# Patient Record
Sex: Male | Born: 1935 | Race: White | Hispanic: No | Marital: Married | Smoking: Former smoker
Health system: Southern US, Community
[De-identification: ages and names within clinical notes are randomized; demographics above are authoritative.]

## PROBLEM LIST (undated history)

## (undated) DIAGNOSIS — K649 Unspecified hemorrhoids: Secondary | ICD-10-CM

## (undated) DIAGNOSIS — M1990 Unspecified osteoarthritis, unspecified site: Secondary | ICD-10-CM

## (undated) DIAGNOSIS — I4891 Unspecified atrial fibrillation: Secondary | ICD-10-CM

## (undated) DIAGNOSIS — I639 Cerebral infarction, unspecified: Secondary | ICD-10-CM

## (undated) DIAGNOSIS — Z95810 Presence of automatic (implantable) cardiac defibrillator: Secondary | ICD-10-CM

## (undated) DIAGNOSIS — F32A Depression, unspecified: Secondary | ICD-10-CM

## (undated) DIAGNOSIS — I509 Heart failure, unspecified: Secondary | ICD-10-CM

## (undated) DIAGNOSIS — E785 Hyperlipidemia, unspecified: Secondary | ICD-10-CM

## (undated) DIAGNOSIS — I10 Essential (primary) hypertension: Secondary | ICD-10-CM

## (undated) DIAGNOSIS — C4490 Unspecified malignant neoplasm of skin, unspecified: Secondary | ICD-10-CM

## (undated) DIAGNOSIS — F329 Major depressive disorder, single episode, unspecified: Secondary | ICD-10-CM

## (undated) DIAGNOSIS — I5022 Chronic systolic (congestive) heart failure: Secondary | ICD-10-CM

## (undated) DIAGNOSIS — Z87442 Personal history of urinary calculi: Secondary | ICD-10-CM

## (undated) DIAGNOSIS — J189 Pneumonia, unspecified organism: Secondary | ICD-10-CM

## (undated) DIAGNOSIS — I255 Ischemic cardiomyopathy: Secondary | ICD-10-CM

## (undated) DIAGNOSIS — N289 Disorder of kidney and ureter, unspecified: Secondary | ICD-10-CM

## (undated) DIAGNOSIS — G459 Transient cerebral ischemic attack, unspecified: Secondary | ICD-10-CM

## (undated) DIAGNOSIS — K222 Esophageal obstruction: Secondary | ICD-10-CM

## (undated) DIAGNOSIS — N400 Enlarged prostate without lower urinary tract symptoms: Secondary | ICD-10-CM

## (undated) DIAGNOSIS — Z8719 Personal history of other diseases of the digestive system: Secondary | ICD-10-CM

## (undated) DIAGNOSIS — K219 Gastro-esophageal reflux disease without esophagitis: Secondary | ICD-10-CM

## (undated) DIAGNOSIS — I219 Acute myocardial infarction, unspecified: Secondary | ICD-10-CM

## (undated) DIAGNOSIS — I2510 Atherosclerotic heart disease of native coronary artery without angina pectoris: Secondary | ICD-10-CM

## (undated) DIAGNOSIS — R339 Retention of urine, unspecified: Secondary | ICD-10-CM

## (undated) HISTORY — DX: Ischemic cardiomyopathy: I25.5

## (undated) HISTORY — DX: Hyperlipidemia, unspecified: E78.5

## (undated) HISTORY — DX: Gastro-esophageal reflux disease without esophagitis: K21.9

## (undated) HISTORY — DX: Essential (primary) hypertension: I10

## (undated) HISTORY — DX: Personal history of other diseases of the digestive system: Z87.19

## (undated) HISTORY — DX: Esophageal obstruction: K22.2

## (undated) HISTORY — DX: Transient cerebral ischemic attack, unspecified: G45.9

## (undated) HISTORY — DX: Chronic systolic (congestive) heart failure: I50.22

## (undated) HISTORY — DX: Benign prostatic hyperplasia without lower urinary tract symptoms: N40.0

## (undated) HISTORY — PX: JOINT REPLACEMENT: SHX530

## (undated) HISTORY — PX: OTHER SURGICAL HISTORY: SHX169

## (undated) HISTORY — DX: Unspecified atrial fibrillation: I48.91

## (undated) HISTORY — DX: Personal history of urinary calculi: Z87.442

## (undated) HISTORY — DX: Heart failure, unspecified: I50.9

## (undated) HISTORY — DX: Unspecified hemorrhoids: K64.9

## (undated) HISTORY — DX: Unspecified malignant neoplasm of skin, unspecified: C44.90

---

## 1998-12-26 ENCOUNTER — Ambulatory Visit (HOSPITAL_COMMUNITY): Admission: RE | Admit: 1998-12-26 | Discharge: 1998-12-26 | Payer: Self-pay | Admitting: Internal Medicine

## 1999-02-25 ENCOUNTER — Encounter: Admission: RE | Admit: 1999-02-25 | Discharge: 1999-04-22 | Payer: Self-pay | Admitting: Orthopedic Surgery

## 1999-08-05 ENCOUNTER — Encounter (INDEPENDENT_AMBULATORY_CARE_PROVIDER_SITE_OTHER): Payer: Self-pay | Admitting: Specialist

## 1999-08-05 ENCOUNTER — Ambulatory Visit (HOSPITAL_COMMUNITY): Admission: RE | Admit: 1999-08-05 | Discharge: 1999-08-05 | Payer: Self-pay | Admitting: *Deleted

## 2000-12-22 ENCOUNTER — Encounter (INDEPENDENT_AMBULATORY_CARE_PROVIDER_SITE_OTHER): Payer: Self-pay | Admitting: Specialist

## 2000-12-22 ENCOUNTER — Ambulatory Visit (HOSPITAL_COMMUNITY): Admission: RE | Admit: 2000-12-22 | Discharge: 2000-12-22 | Payer: Self-pay | Admitting: *Deleted

## 2002-01-31 ENCOUNTER — Ambulatory Visit (HOSPITAL_COMMUNITY): Admission: RE | Admit: 2002-01-31 | Discharge: 2002-01-31 | Payer: Self-pay | Admitting: *Deleted

## 2002-01-31 ENCOUNTER — Encounter (INDEPENDENT_AMBULATORY_CARE_PROVIDER_SITE_OTHER): Payer: Self-pay | Admitting: Specialist

## 2004-02-07 ENCOUNTER — Ambulatory Visit (HOSPITAL_COMMUNITY): Admission: RE | Admit: 2004-02-07 | Discharge: 2004-02-07 | Payer: Self-pay | Admitting: *Deleted

## 2004-02-07 ENCOUNTER — Encounter (INDEPENDENT_AMBULATORY_CARE_PROVIDER_SITE_OTHER): Payer: Self-pay | Admitting: Specialist

## 2004-11-23 ENCOUNTER — Ambulatory Visit (HOSPITAL_COMMUNITY): Admission: RE | Admit: 2004-11-23 | Discharge: 2004-11-23 | Payer: Self-pay | Admitting: *Deleted

## 2004-11-23 ENCOUNTER — Encounter (INDEPENDENT_AMBULATORY_CARE_PROVIDER_SITE_OTHER): Payer: Self-pay | Admitting: *Deleted

## 2005-08-03 ENCOUNTER — Emergency Department (HOSPITAL_COMMUNITY): Admission: EM | Admit: 2005-08-03 | Discharge: 2005-08-04 | Payer: Self-pay | Admitting: Emergency Medicine

## 2005-08-24 ENCOUNTER — Ambulatory Visit (HOSPITAL_COMMUNITY): Admission: RE | Admit: 2005-08-24 | Discharge: 2005-08-24 | Payer: Self-pay | Admitting: *Deleted

## 2005-08-24 ENCOUNTER — Encounter (INDEPENDENT_AMBULATORY_CARE_PROVIDER_SITE_OTHER): Payer: Self-pay | Admitting: Specialist

## 2006-11-18 ENCOUNTER — Ambulatory Visit: Payer: Self-pay | Admitting: Cardiovascular Disease

## 2006-11-18 ENCOUNTER — Encounter: Payer: Self-pay | Admitting: Cardiovascular Disease

## 2006-11-18 ENCOUNTER — Observation Stay (HOSPITAL_COMMUNITY): Admission: EM | Admit: 2006-11-18 | Discharge: 2006-11-21 | Payer: Self-pay | Admitting: Emergency Medicine

## 2006-11-18 ENCOUNTER — Ambulatory Visit: Payer: Self-pay | Admitting: Cardiology

## 2006-12-05 ENCOUNTER — Ambulatory Visit: Payer: Self-pay | Admitting: Cardiovascular Disease

## 2006-12-05 ENCOUNTER — Encounter: Payer: Self-pay | Admitting: Physician Assistant

## 2006-12-05 LAB — CONVERTED CEMR LAB
Chloride: 106 meq/L (ref 96–112)
Creatinine, Ser: 1.2 mg/dL (ref 0.4–1.5)
Magnesium: 2.3 mg/dL (ref 1.5–2.5)
Sodium: 141 meq/L (ref 135–145)

## 2006-12-13 HISTORY — PX: CARDIAC DEFIBRILLATOR PLACEMENT: SHX171

## 2006-12-19 ENCOUNTER — Ambulatory Visit: Payer: Self-pay | Admitting: Internal Medicine

## 2006-12-26 ENCOUNTER — Ambulatory Visit: Payer: Self-pay | Admitting: Cardiovascular Disease

## 2006-12-26 LAB — CONVERTED CEMR LAB
Albumin: 3.6 g/dL (ref 3.5–5.2)
Cholesterol: 96 mg/dL (ref 0–200)
HDL: 21.6 mg/dL — ABNORMAL LOW (ref >39.0)
LDL Cholesterol: 53 mg/dL (ref 0–99)
Total Bilirubin: 0.9 mg/dL (ref 0.3–1.2)
Total CHOL/HDL Ratio: 4.4
Triglycerides: 109 mg/dL (ref 0–149)

## 2006-12-28 ENCOUNTER — Ambulatory Visit: Payer: Self-pay | Admitting: Internal Medicine

## 2006-12-28 ENCOUNTER — Observation Stay (HOSPITAL_COMMUNITY): Admission: AD | Admit: 2006-12-28 | Discharge: 2006-12-29 | Payer: Self-pay | Admitting: Internal Medicine

## 2007-01-12 ENCOUNTER — Ambulatory Visit: Payer: Self-pay

## 2007-01-25 ENCOUNTER — Ambulatory Visit: Payer: Self-pay | Admitting: Cardiovascular Disease

## 2007-03-21 ENCOUNTER — Ambulatory Visit: Payer: Self-pay | Admitting: Internal Medicine

## 2007-03-22 ENCOUNTER — Ambulatory Visit: Payer: Self-pay | Admitting: Cardiovascular Disease

## 2007-06-22 ENCOUNTER — Ambulatory Visit: Payer: Self-pay | Admitting: Internal Medicine

## 2007-06-27 ENCOUNTER — Ambulatory Visit: Payer: Self-pay | Admitting: Cardiovascular Disease

## 2007-09-21 ENCOUNTER — Ambulatory Visit: Payer: Self-pay | Admitting: Internal Medicine

## 2007-11-01 ENCOUNTER — Emergency Department (HOSPITAL_COMMUNITY): Admission: EM | Admit: 2007-11-01 | Discharge: 2007-11-01 | Payer: Self-pay | Admitting: Emergency Medicine

## 2007-12-14 ENCOUNTER — Ambulatory Visit: Payer: Self-pay | Admitting: Cardiovascular Disease

## 2008-01-03 ENCOUNTER — Ambulatory Visit: Payer: Self-pay

## 2008-03-26 ENCOUNTER — Ambulatory Visit: Payer: Self-pay | Admitting: Internal Medicine

## 2008-04-22 DIAGNOSIS — Z95810 Presence of automatic (implantable) cardiac defibrillator: Secondary | ICD-10-CM

## 2008-04-22 DIAGNOSIS — E7800 Pure hypercholesterolemia, unspecified: Secondary | ICD-10-CM | POA: Insufficient documentation

## 2008-04-22 DIAGNOSIS — I5041 Acute combined systolic (congestive) and diastolic (congestive) heart failure: Secondary | ICD-10-CM

## 2008-04-22 DIAGNOSIS — I2510 Atherosclerotic heart disease of native coronary artery without angina pectoris: Secondary | ICD-10-CM | POA: Insufficient documentation

## 2008-06-17 ENCOUNTER — Encounter: Payer: Self-pay | Admitting: Cardiovascular Disease

## 2008-06-17 ENCOUNTER — Ambulatory Visit: Payer: Self-pay | Admitting: Cardiovascular Disease

## 2008-06-20 ENCOUNTER — Ambulatory Visit: Payer: Self-pay | Admitting: Internal Medicine

## 2008-09-10 ENCOUNTER — Encounter: Payer: Self-pay | Admitting: Internal Medicine

## 2008-09-19 ENCOUNTER — Ambulatory Visit: Payer: Self-pay | Admitting: Internal Medicine

## 2008-11-18 ENCOUNTER — Telehealth (INDEPENDENT_AMBULATORY_CARE_PROVIDER_SITE_OTHER): Payer: Self-pay | Admitting: *Deleted

## 2008-12-13 ENCOUNTER — Telehealth: Payer: Self-pay | Admitting: Adult Health

## 2008-12-13 ENCOUNTER — Encounter (INDEPENDENT_AMBULATORY_CARE_PROVIDER_SITE_OTHER): Payer: Self-pay | Admitting: *Deleted

## 2008-12-13 ENCOUNTER — Ambulatory Visit: Payer: Self-pay | Admitting: Cardiovascular Disease

## 2008-12-17 ENCOUNTER — Inpatient Hospital Stay (HOSPITAL_COMMUNITY): Admission: EM | Admit: 2008-12-17 | Discharge: 2008-12-19 | Payer: Self-pay | Admitting: Emergency Medicine

## 2008-12-17 ENCOUNTER — Ambulatory Visit: Payer: Self-pay | Admitting: Cardiovascular Disease

## 2008-12-17 ENCOUNTER — Telehealth: Payer: Self-pay | Admitting: Cardiovascular Disease

## 2008-12-17 ENCOUNTER — Encounter (INDEPENDENT_AMBULATORY_CARE_PROVIDER_SITE_OTHER): Payer: Self-pay | Admitting: *Deleted

## 2008-12-17 DIAGNOSIS — E876 Hypokalemia: Secondary | ICD-10-CM

## 2008-12-17 LAB — CONVERTED CEMR LAB
BUN: 11 mg/dL (ref 6–23)
Creatinine, Ser: 1 mg/dL (ref 0.4–1.5)
GFR calc non Af Amer: 77.37 mL/min (ref >60)
Glucose, Bld: 137 mg/dL — ABNORMAL HIGH (ref 70–99)
HCT: 37.8 % — ABNORMAL LOW (ref 39.0–52.0)
Hemoglobin: 13.1 g/dL (ref 13.0–17.0)
MCHC: 34.5 g/dL (ref 30.0–36.0)
Potassium: 3.9 meq/L (ref 3.5–5.1)
Prothrombin Time: 14.4 s (ref 11.6–15.2)
RBC: 4.18 M/uL — ABNORMAL LOW (ref 4.22–5.81)
Sodium: 142 meq/L (ref 135–145)

## 2008-12-18 ENCOUNTER — Encounter: Payer: Self-pay | Admitting: Cardiovascular Disease

## 2008-12-19 ENCOUNTER — Ambulatory Visit: Payer: Self-pay | Admitting: Internal Medicine

## 2008-12-26 ENCOUNTER — Encounter: Payer: Self-pay | Admitting: Internal Medicine

## 2009-01-23 ENCOUNTER — Ambulatory Visit: Payer: Self-pay | Admitting: Cardiovascular Disease

## 2009-01-28 LAB — CONVERTED CEMR LAB
BUN: 13 mg/dL (ref 6–23)
CO2: 29 meq/L (ref 19–32)
Calcium: 8.4 mg/dL (ref 8.4–10.5)
Chloride: 107 meq/L (ref 96–112)
Creatinine, Ser: 1.2 mg/dL (ref 0.4–1.5)
GFR calc non Af Amer: 62.67 mL/min (ref >60)
Glucose, Bld: 155 mg/dL — ABNORMAL HIGH (ref 70–99)
Potassium: 3.3 meq/L — ABNORMAL LOW (ref 3.5–5.1)
Sodium: 142 meq/L (ref 135–145)

## 2009-02-10 ENCOUNTER — Ambulatory Visit: Payer: Self-pay | Admitting: Cardiovascular Disease

## 2009-02-11 LAB — CONVERTED CEMR LAB
CO2: 28 meq/L (ref 19–32)
Calcium: 9.2 mg/dL (ref 8.4–10.5)
Chloride: 109 meq/L (ref 96–112)
GFR calc non Af Amer: 62.66 mL/min (ref >60)
Potassium: 4.2 meq/L (ref 3.5–5.1)

## 2009-03-19 ENCOUNTER — Ambulatory Visit: Payer: Self-pay | Admitting: Cardiovascular Disease

## 2009-03-24 LAB — CONVERTED CEMR LAB
BUN: 13 mg/dL (ref 6–23)
CO2: 31 meq/L (ref 19–32)
Calcium: 9 mg/dL (ref 8.4–10.5)
Creatinine, Ser: 1.3 mg/dL (ref 0.4–1.5)
GFR calc non Af Amer: 57.12 mL/min (ref >60)

## 2009-04-08 ENCOUNTER — Encounter: Payer: Self-pay | Admitting: Internal Medicine

## 2009-04-08 ENCOUNTER — Encounter: Payer: Self-pay | Admitting: Cardiovascular Disease

## 2009-04-23 ENCOUNTER — Ambulatory Visit: Payer: Self-pay | Admitting: Internal Medicine

## 2009-04-23 ENCOUNTER — Encounter: Payer: Self-pay | Admitting: Internal Medicine

## 2009-04-23 DIAGNOSIS — M129 Arthropathy, unspecified: Secondary | ICD-10-CM | POA: Insufficient documentation

## 2009-04-23 DIAGNOSIS — N401 Enlarged prostate with lower urinary tract symptoms: Secondary | ICD-10-CM

## 2009-04-23 DIAGNOSIS — Z8719 Personal history of other diseases of the digestive system: Secondary | ICD-10-CM

## 2009-04-23 DIAGNOSIS — I10 Essential (primary) hypertension: Secondary | ICD-10-CM | POA: Insufficient documentation

## 2009-04-25 ENCOUNTER — Ambulatory Visit: Payer: Self-pay | Admitting: Internal Medicine

## 2009-06-03 ENCOUNTER — Telehealth (INDEPENDENT_AMBULATORY_CARE_PROVIDER_SITE_OTHER): Payer: Self-pay | Admitting: *Deleted

## 2009-06-04 ENCOUNTER — Encounter (INDEPENDENT_AMBULATORY_CARE_PROVIDER_SITE_OTHER): Payer: Self-pay | Admitting: *Deleted

## 2009-06-04 ENCOUNTER — Telehealth (INDEPENDENT_AMBULATORY_CARE_PROVIDER_SITE_OTHER): Payer: Self-pay | Admitting: *Deleted

## 2009-07-24 ENCOUNTER — Ambulatory Visit: Payer: Self-pay | Admitting: Internal Medicine

## 2009-08-25 ENCOUNTER — Ambulatory Visit: Payer: Self-pay | Admitting: Internal Medicine

## 2009-08-28 ENCOUNTER — Encounter: Payer: Self-pay | Admitting: Internal Medicine

## 2009-09-02 ENCOUNTER — Telehealth: Payer: Self-pay | Admitting: Cardiovascular Disease

## 2009-09-02 ENCOUNTER — Encounter: Payer: Self-pay | Admitting: Cardiovascular Disease

## 2009-09-08 ENCOUNTER — Encounter: Payer: Self-pay | Admitting: Cardiovascular Disease

## 2009-09-09 ENCOUNTER — Telehealth: Payer: Self-pay | Admitting: Cardiovascular Disease

## 2009-09-15 ENCOUNTER — Encounter: Payer: Self-pay | Admitting: Internal Medicine

## 2009-09-16 ENCOUNTER — Ambulatory Visit: Payer: Self-pay | Admitting: Cardiovascular Disease

## 2009-09-16 DIAGNOSIS — M719 Bursopathy, unspecified: Secondary | ICD-10-CM

## 2009-09-16 DIAGNOSIS — M67919 Unspecified disorder of synovium and tendon, unspecified shoulder: Secondary | ICD-10-CM | POA: Insufficient documentation

## 2009-09-17 ENCOUNTER — Encounter: Admission: RE | Admit: 2009-09-17 | Discharge: 2009-09-17 | Payer: Self-pay | Admitting: Sports Medicine

## 2009-10-23 ENCOUNTER — Ambulatory Visit: Payer: Self-pay | Admitting: Internal Medicine

## 2009-11-24 ENCOUNTER — Ambulatory Visit: Payer: Self-pay | Admitting: Internal Medicine

## 2009-11-24 LAB — CONVERTED CEMR LAB
ALT: 15 units/L (ref 0–53)
AST: 19 units/L (ref 0–37)
Albumin: 4.1 g/dL (ref 3.5–5.2)
Direct LDL: 60.4 mg/dL
HDL: 30.3 mg/dL — ABNORMAL LOW (ref >39.00)
Total CHOL/HDL Ratio: 5

## 2009-11-28 ENCOUNTER — Encounter: Payer: Self-pay | Admitting: Cardiovascular Disease

## 2009-11-28 ENCOUNTER — Encounter: Payer: Self-pay | Admitting: Internal Medicine

## 2009-12-24 ENCOUNTER — Ambulatory Visit (HOSPITAL_COMMUNITY): Admission: EM | Admit: 2009-12-24 | Discharge: 2009-12-24 | Payer: Self-pay | Admitting: Emergency Medicine

## 2009-12-24 ENCOUNTER — Ambulatory Visit: Payer: Self-pay | Admitting: Gastroenterology

## 2009-12-25 ENCOUNTER — Telehealth: Payer: Self-pay | Admitting: Gastroenterology

## 2009-12-31 ENCOUNTER — Telehealth: Payer: Self-pay | Admitting: Gastroenterology

## 2010-01-08 ENCOUNTER — Encounter (INDEPENDENT_AMBULATORY_CARE_PROVIDER_SITE_OTHER): Payer: Self-pay | Admitting: *Deleted

## 2010-01-09 ENCOUNTER — Ambulatory Visit: Payer: Self-pay | Admitting: Gastroenterology

## 2010-01-16 ENCOUNTER — Ambulatory Visit: Payer: Self-pay | Admitting: Gastroenterology

## 2010-01-22 ENCOUNTER — Ambulatory Visit: Payer: Self-pay | Admitting: Internal Medicine

## 2010-02-20 ENCOUNTER — Encounter: Payer: Self-pay | Admitting: Internal Medicine

## 2010-02-23 ENCOUNTER — Ambulatory Visit: Payer: Self-pay | Admitting: Internal Medicine

## 2010-02-26 ENCOUNTER — Encounter: Payer: Self-pay | Admitting: Internal Medicine

## 2010-03-05 ENCOUNTER — Encounter: Payer: Self-pay | Admitting: Internal Medicine

## 2010-03-18 ENCOUNTER — Telehealth (INDEPENDENT_AMBULATORY_CARE_PROVIDER_SITE_OTHER): Payer: Self-pay | Admitting: *Deleted

## 2010-03-18 ENCOUNTER — Ambulatory Visit: Payer: Self-pay | Admitting: Cardiovascular Disease

## 2010-03-19 ENCOUNTER — Encounter: Payer: Self-pay | Admitting: Cardiovascular Disease

## 2010-04-27 ENCOUNTER — Encounter: Payer: Self-pay | Admitting: Internal Medicine

## 2010-04-28 ENCOUNTER — Ambulatory Visit: Payer: Self-pay | Admitting: Internal Medicine

## 2010-04-29 DIAGNOSIS — I4891 Unspecified atrial fibrillation: Secondary | ICD-10-CM | POA: Insufficient documentation

## 2010-05-14 ENCOUNTER — Ambulatory Visit: Payer: Self-pay | Admitting: Internal Medicine

## 2010-05-25 ENCOUNTER — Encounter: Payer: Self-pay | Admitting: Cardiovascular Disease

## 2010-05-25 ENCOUNTER — Encounter: Payer: Self-pay | Admitting: Internal Medicine

## 2010-05-27 ENCOUNTER — Ambulatory Visit: Payer: Self-pay | Admitting: Internal Medicine

## 2010-05-27 DIAGNOSIS — L259 Unspecified contact dermatitis, unspecified cause: Secondary | ICD-10-CM | POA: Insufficient documentation

## 2010-07-14 NOTE — Procedures (Signed)
Summary: EGD/food impaction   EGD  Procedure date:  12/24/2009  Findings:      Location: [facility]   ENDOSCOPY PROCEDURE REPORT  PATIENT:  Jacob [name], Jacob [name]  MR#:  [id number] BIRTHDATE:   [birth year]   GENDER:   male  ENDOSCOPIST:   Venita Lick. Russella Dar, MD, [facility] Referred by: [facility]  PROCEDURE DATE:  12/24/2009 PROCEDURE:  EGD with foreign body removal ASA CLASS:   Class II INDICATIONS: dysphagia, food impaction tonight while eating steak and pickles  MEDICATIONS:   Fentanyl 50 mcg IV, Versed 5 mg IV TOPICAL ANESTHETIC:   Cetacaine Spray DESCRIPTION OF PROCEDURE:   After the risks benefits and alternatives of the procedure were thoroughly explained, informed consent was obtained.  The Pentax [id number] [id number] endoscope was introduced through the mouth and advanced to the second portion of the duodenum, limited by a large amount of solid retained food in the stomach. The instrument was slowly withdrawn as the mucosa was fully examined. <<PROCEDUREIMAGES>>          <<OLD IMAGES>> Steak and pickles impacted in the distal esophagus. The impaction was removed. Esophagitis was found in the distal esophagus. It was erythematous.  A stricture was found in the distal esophagus. It was circumferential and benign appearing. It was 15 mm in diameter. Located 2-3 cm above EGJ.  Retained food was present in the body of the stomach.  The duodenal bulb was normal in appearance, as was the postbulbar duodenum.  Otherwise the examination was normal.  Retroflexed views revealed no abnormalities.  The scope was then withdrawn from the patient and the procedure completed.  COMPLICATIONS:   None   ENDOSCOPIC IMPRESSION:  1) Meat impaction in the distal esophagus  2) Esophagitis in the distal esophagus  3) 15 mm distal esopahgeal  stricture  4) Food, retained in the body of the stomach  RECOMMENDATIONS:  1) PPI qam long term  2) anti-reflux regimen  3) EGD/dilation in next 2-4  weeks 4) Clear liquids tonight and then a soft diet tomorrow avoiding meats and breads until dilation has been completed   Flay Ghosh T. Russella Dar, MD, [facility]

## 2010-07-14 NOTE — Miscellaneous (Signed)
Summary: [facility] Previsit/prep  Clinical Lists Changes  Medications: Added new medication of MOVIPREP 100 GM  SOLR (PEG-KCL-NACL-NASULF-NA ASC-C) As per prep instructions. - Signed Rx of MOVIPREP 100 GM  SOLR (PEG-KCL-NACL-NASULF-NA ASC-C) As per prep instructions.;  #1 x 0;  Signed;  Entered by: Wyona Almas RN;  Authorized by: Meryl Dare MD [facility];  Method used: Electronically to [facility] #[id number]*, [address], [place], [place]  [address], Ph: [phone], Fax: [phone] Observations: Added new observation of ALLERGY REV: Done (01/09/2010 10:30)    Prescriptions: MOVIPREP 100 GM  SOLR (PEG-KCL-NACL-NASULF-NA ASC-C) As per prep instructions.  #1 x 0   Entered by:   Wyona Almas RN   Authorized by:   Meryl Dare MD [employer]   Signed by:   Wyona Almas RN on 01/09/2010   Method used:   Electronically to        [id number] (retail)       [address] [place], [place]  [address]       Ph: [phone]       Fax: [phone]   RxID:   [id number]

## 2010-07-14 NOTE — Cardiovascular Report (Signed)
Summary: Office Visit Remote   Office Visit Remote   Imported By: Roderic Ovens 08/11/2009 11:16:44  _____________________________________________________________________  External Attachment:    Type:   Image     Comment:   External Document

## 2010-07-14 NOTE — Procedures (Signed)
Summary: Colonoscopy  Patient: Debra Calabretta Note: All result statuses are Final unless otherwise noted.  Tests: (1) Colonoscopy (COL)   COL Colonoscopy           DONE     [facility]     [address]     [place], [place]  [address]           COLONOSCOPY PROCEDURE REPORT           PATIENT:  Tam, Delisle  MR#:  [id number]     BIRTHDATE:  10/27/1931, 78 yrs. old  GENDER:  male     ENDOSCOPIST:  Judie Petit T. Russella Dar, MD, [facility]           PROCEDURE DATE:  01/16/2010     PROCEDURE:  Higher-risk screening colonoscopy G0105     ASA CLASS:  Class III     INDICATIONS:  1) surveillance and high-risk screening  2)     follow-up of polyp, history of polyps-type unknown     MEDICATIONS:   Fentanyl 50 mcg IV, Versed 6 mg IV     DESCRIPTION OF PROCEDURE:   After the risks benefits and     alternatives of the procedure were thoroughly explained, informed     consent was obtained.  Digital rectal exam was performed and     revealed no abnormalities.   The LB [id number] endoscope     was introduced through the anus and advanced to the cecum, which     was identified by both the appendix and ileocecal valve, without     limitations.  The quality of the prep was good, using MoviPrep.     The instrument was then slowly withdrawn as the colon was fully     examined.     <<PROCEDUREIMAGES>>     FINDINGS:  Moderate diverticulosis was found in the sigmoid to     descending colon.  A normal appearing cecum, ileocecal valve, and     appendiceal orifice were identified. The ascending, hepatic     flexure, transverse, splenic flexure, and rectum appeared     unremarkable. Retroflexed views in the rectum revealed internal     hemorrhoids, small.  The time to cecum =  1.5  minutes. The scope     was then withdrawn (time =  9  min) from the patient and the     procedure completed.     COMPLICATIONS:  None           ENDOSCOPIC IMPRESSION:     1) Moderate diverticulosis in the sigmoid to  descending colon     2) Internal hemorrhoids     RECOMMENDATIONS:     1) High fiber diet with liberal fluid intake.     2) No plans for future surveillance colonoscopies given no polyps     today, age, comorbidities           Judie Petit T. Russella Dar, MD, [facility]           n.     eSIGNED:   Venita Lick. Stark at 01/16/2010 03:33 PM           Mendel Ryder, [id number]  Note: An exclamation mark (!) indicates a result that was not dispersed into the flowsheet. Document Creation Date: 01/16/2010 3:35 PM _______________________________________________________________________  (1) Order result status: Final Collection or observation date-time: 01/16/2010 15:28 Requested date-time:  Receipt date-time:  Reported date-time:  Referring Physician:   Ordering Physician: Claudette Head [id number]) Specimen Source:  Source: Launa Grill Order Number: [id number] Lab site:

## 2010-07-14 NOTE — Letter (Signed)
Summary: [facility]  [facility]   Imported By: Marylou Mccoy 11/19/2009 08:22:38  _____________________________________________________________________  External Attachment:    Type:   Image     Comment:   External Document

## 2010-07-14 NOTE — Progress Notes (Signed)
Summary: status of faxed- [facility]   Phone Note Call from Patient Call back at Home Phone [phone]   Caller: Son- nicasio Reason for Call: Talk to Nurse Summary of Call: per son , a request was sent over to dr. Eden Emms [facility]. phone # [phone] ext [phone]. press 7. checking on status of faxed.  Initial call taken by: Jacob [name],  September 02, 2009 3:41 PM  Follow-up for Phone Call        spoke with pt son, paperwork recieved and waiting for sign Jacob Goody, RN  September 02, 2009 5:47 PM

## 2010-07-14 NOTE — Letter (Signed)
Summary: [facility]  [facility]   Imported By: Sherian Rein 03/03/2010 13:11:31  _____________________________________________________________________  External Attachment:    Type:   Image     Comment:   External Document

## 2010-07-14 NOTE — Progress Notes (Signed)
Summary: Triage  Phone Note Call from Patient Call back at Home Phone [phone] Call back at [phone] Cell#   Caller: Patient Call For: Dr. Russella Dar Reason for Call: Talk to Nurse Summary of Call: pt. is sch'd for Endo on 01-16-10 and wants to know if he can have a COL at the same time? Was a pt. of Dr. Virginia Rochester  and feels like he is due for COL. Initial call taken by: Karna Christmas,  December 31, 2009 3:46 PM  Follow-up for Phone Call        No family hx, no changes in bowel habits, constipation, rectal bleeding, diarrhea.  Dr Russella Dar I have pasted in last colon form Virginia Rochester 2006 in his chart.  please advise if ok to add colon ontop ESA .  You do have open spots on that day.  patient is notified Dr Russella Dar is out of the office until next week. Follow-up by: Darcey Nora RN, CGRN,  December 31, 2009 4:09 PM  Additional Follow-up for Phone Call Additional follow up Details #1::        OK to add COLON given history of polyps per Dr. Wende Neighbors 2006 colonoscopy report. His EGD from 7/13 is not in [facility]. Please place a copy in [facility].  Additional Follow-up by: Meryl Dare MD [facility],  January 05, 2010 3:21 PM    Additional Follow-up for Phone Call Additional follow up Details #2::    rescheduled to a double.  Patient  aware.  pre-visit is this Friday. Follow-up by: Darcey Nora RN, CGRN,  January 05, 2010 3:27 PM

## 2010-07-14 NOTE — Assessment & Plan Note (Signed)
Summary: 6 month rov/sl   Primary Provider:  Newt Lukes MD  CC:  no complaints.  History of Present Illness: Jacob [name] returns today for followup.  He is a pleasant 77 yo man with a h/o an ICM, EF 25%, class 1 CHF, HTN and dyslipidemia.  He denies c/p or sob.  He does have problems with arthritis in his knees.  He has had no intercurrent ICD therapies.  He was recathed in July 2010 and had a chronically occluded RCA with collaterals and no progression of left sided disease.  He has an apparant right rotator cuff tear and needs a CT arthrogram tomorrow.  This was arranged by Dr. Margaretha Sheffield since his AICD precludes an MRI.  I wrote him a note to take with him to clear him for this.  I assume Dr Gilmer Mor office or the radiologist arranged to have his Cr checked  Current Problems (verified): 1)  Hypercholesterolemia, Mixed  (ICD-272.0) 2)  Hypertension  (ICD-401.9) 3)  Coronary Atherosclerosis, Native Vessel  (ICD-414.01) 4)  CHF  (ICD-428.0) 5)  Automatic Implantable Cardiac Defibrillator Situ  (ICD-V45.02) 6)  Gerd  (ICD-530.81) 7)  Benign Prostatic Hypertrophy, With Obstruction  (ICD-600.01) 8)  Arthritis  (ICD-716.90) 9)  Urinary Incontinence  (ICD-788.30) 10)  Diverticulitis, Hx of  (ICD-V12.79) 11)  Encounter For Long-term Use of Other Medications  (ICD-V58.69) 12)  Hypokalemia  (ICD-276.8) 13)  Hypertensive Heart Disease, Benign, With CHF  (ICD-402.11)  Current Medications (verified): 1)  Carvedilol 6.25 Mg Tabs (Carvedilol) .... Take One Tablet By Mouth Twice A Day 2)  Aspirin 81 Mg Tbec (Aspirin) .... Take One Tablet By Mouth Daily 3)  Hyzaar 50-12.5 Mg Tabs (Losartan Potassium-Hctz) .Marland Kitchen.. 1 Tab Daily 4)  Omeprazole 20 Mg Cpdr (Omeprazole) .Marland Kitchen.. 1 Tab By Mouth Once Daily 5)  Imdur 30 Mg Xr24h-Tab (Isosorbide Mononitrate) .... Once Daily 6)  Simvastatin 40 Mg Tabs (Simvastatin) .Marland Kitchen.. 1 Tab Qhs 7)  Nasal Spray .... As Needed 8)  Hydrocodone-Acetaminophen 5-325 Mg Tabs  (Hydrocodone-Acetaminophen) .... As Needed 9)  Vitamin B-12  (Cyanocobalamin) .Marland Kitchen.. 1 Tab By Mouth Once Daily 10)  Furosemide 20 Mg Tabs (Furosemide) .... Take 1/2 By Mouth Qd 11)  Vesicare 5 Mg Tabs (Solifenacin Succinate) .... Take 1 Po Qd 12)  Naproxen 500 Mg Tabs (Naproxen) .... Take 1 By Mouth Qd 13)  Fluticasone Propionate 50 Mcg/act Susp (Fluticasone Propionate) .Marland Kitchen.. 1 Spray Each Nostril Once Daily Prn 14)  Flomax 0.4 Mg Caps (Tamsulosin Hcl) .Marland Kitchen.. 1 By Mouth Once Daily 15)  Avodart 0.5 Mg Caps (Dutasteride) .Marland Kitchen.. 1 By Mouth Once Daily  Allergies (verified): 1)  ! Penicillin  Past History:  Past Medical History: Last updated: 08/25/2009 CAD CHF: systolic dysfunction with old IMI Hypertension Hyperlipidemia BPH Barrett's Esophagus Nephrolithiasis, hx of Colonic Polyps Diverticulitis, hx of GERD Urinary incontinence  MD rooster - cards -[facility]ro -[facility] -orr  Past Surgical History: Last updated: 04/22/2008 AICD:  Ladona Ridgel 12/2006  Family History: Last updated: 04/23/2009 Family History of Arthritis (parent) Family History Hypertension (parent) Heart disease (parent)  Social History: Last updated: 04/22/2008 Married Retired Non-smoker Non-drinker  Review of Systems       Denies fever, malais, weight loss, blurry vision, decreased visual acuity, cough, sputum, SOB, hemoptysis, pleuritic pain, palpitaitons, heartburn, abdominal pain, melena, lower extremity edema, claudication, or rash.   Vital Signs:  Patient profile:   77 year old male Height:      70 inches Weight:      210 pounds  BMI:     30.24 Pulse rate:   72 / minute Resp:     12 per minute BP sitting:   100 / 57  (left arm)  Vitals Entered By: Kem Parkinson (September 16, 2009 3:15 PM)  Physical Exam  General:  Affect appropriate Healthy:  appears stated age HEENT: normal Neck supple with no adenopathy JVP normal no bruits no thyromegaly Lungs clear with no wheezing and good  diaphragmatic motion Heart:  S1/S2 no murmur,rub, gallop or click PMI enlarged  Abdomen: benighn, BS positve, no tenderness, no AAA no bruit.  No HSM or HJR Distal pulses intact with no bruits No edema Neuro non-focal Skin warm and dry AICD under left clavicle    ICD Specifications Following MD:  Lewayne Bunting, MD     Referring MD:  [facility] ICD Vendor:  [employer]     ICD Model Number:  [phone]     ICD Serial Number:  [id number] ICD DOI:  12/28/2006     ICD Implanting MD:  Lewayne Bunting, MD  Lead 1:    Location: [facility]     DOI: 12/28/2006     Model #: 9629     Serial #: [id number]     Status: active Lead 2:    Location: RV     DOI: 12/28/2006     Model #: 2440     Serial #: [id number]     Status: active  Indications::  ICM   ICD Follow Up ICD Dependent:  No      Episodes Coumadin:  No  Brady Parameters Mode DDD     Lower Rate Limit:  60     Upper Rate Limit 110 PAV 400     Sensed AV Delay:  290  Tachy Zones VF:  210     VT:  180     Impression & Recommendations:  Problem # 1:  CORONARY ATHEROSCLEROSIS, NATIVE VESSEL (ICD-414.01) Stable no angina  His updated medication list for this problem includes:    Carvedilol 6.25 Mg Tabs (Carvedilol) .Marland Kitchen... Take one tablet by mouth twice a day    Aspirin 81 Mg Tbec (Aspirin) .Marland Kitchen... Take one tablet by mouth daily    Imdur 30 Mg Xr24h-tab (Isosorbide mononitrate) ..... Once daily  Problem # 2:  HYPERTENSION (ICD-401.9) Well controlled His updated medication list for this problem includes:    Carvedilol 6.25 Mg Tabs (Carvedilol) .Marland Kitchen... Take one tablet by mouth twice a day    Aspirin 81 Mg Tbec (Aspirin) .Marland Kitchen... Take one tablet by mouth daily    Hyzaar 50-12.5 Mg Tabs (Losartan potassium-hctz) .Marland Kitchen... 1 tab daily    Furosemide 20 Mg Tabs (Furosemide) .Marland Kitchen... Take 1/2 by mouth qd  Problem # 3:  CHF (ICD-428.0) Stable functional class 2.   His updated medication list for this problem includes:    Carvedilol 6.25 Mg Tabs (Carvedilol) .Marland Kitchen... Take one  tablet by mouth twice a day    Aspirin 81 Mg Tbec (Aspirin) .Marland Kitchen... Take one tablet by mouth daily    Hyzaar 50-12.5 Mg Tabs (Losartan potassium-hctz) .Marland Kitchen... 1 tab daily    Imdur 30 Mg Xr24h-tab (Isosorbide mononitrate) ..... Once daily    Furosemide 20 Mg Tabs (Furosemide) .Marland Kitchen... Take 1/2 by mouth qd  Problem # 4:  AUTOMATIC IMPLANTABLE CARDIAC DEFIBRILLATOR SITU (ICD-V45.02) Normal device function with no D/C's  Problem # 5:  ROTATOR CUFF INJURY, RIGHT SHOULDER (ICD-726.10) Ok to have CT arthrogram.  F/U Dr Margaretha Sheffield.  Significant decreased ROM unable to  lift arm over head  Patient Instructions: 1)  Your physician recommends that you schedule a follow-up appointment in: 6 MONTHS

## 2010-07-14 NOTE — Letter (Signed)
Summary: [facility]  [facility]   Imported By: Lester [place] 12/12/2009 09:16:13  _____________________________________________________________________  External Attachment:    Type:   Image     Comment:   External Document

## 2010-07-14 NOTE — Letter (Signed)
Summary: Remote Device Check  [employer], Main Office  [address]   [place], [place] [address]   Phone: [phone]  Fax: [phone]     February 20, 2010 MRN: [id number]   Jacob [name] [address] [place], [place]  [address]   Dear [name],   Your remote transmission was recieved and reviewed by your physician.  All diagnostics were within normal limits for you.  __X____Your next office visit is scheduled for:    04-28-10 @ 1120.  Sincerely,  Vella Kohler

## 2010-07-14 NOTE — Assessment & Plan Note (Signed)
Summary: 3-4 MTH FU--STC   Vital Signs:  Patient profile:   77 year old male Height:      70 inches (177.80 cm) Weight:      199.8 pounds (90.82 kg) O2 Sat:      96 % on Room air Temp:     99.0 degrees F (37.22 degrees C) oral Pulse rate:   87 / minute BP sitting:   110 / 60  (left arm) Cuff size:   regular  Vitals Entered By: Orlan Leavens RMA (February 23, 2010 1:27 PM)  O2 Flow:  Room air CC: 3 month follow-up Is Patient Diabetic? No Pain Assessment Patient in pain? no        Primary Care Provider:  Newt Lukes MD  CC:  3 month follow-up.  History of Present Illness: here for 3 mo f/u - requests handicap tag form completed  c/o arthritis both shoulders, right>left, knees and hips s/p fall accidental on left shoulder taking naproxen 500 with some improvement - saw ortho for same - had steroid injection with temp relief  CHF/CAD - follows with [facility] cards for same - nishan/taylor/chf clinic reports compliance with ongoing medical treatment and no changes in medication dose or frequency. denies adverse side effects related to current therapy. no cp, SOB or edema -   HTN -  reports compliance with ongoing medical treatment and no changes in medication dose or frequency. denies adverse side effects related to current therapy.  no HA or vision changes  dyslipidemia - reports compliance with ongoing medical treatment and no changes in medication dose or frequency. denies adverse side effects related to current therapy. no GI or muscle c/o  BPH - follows with dr. Isabel Caprice -uro recent changes in meds reviewed - feels bladder emptying better hard time getting meds approved by insurance so has resumed avodart and flomax until meds cleared - needs refills on same   Clinical Review Panels:  Immunizations   Last Tetanus Booster:  Td (04/23/2009)   Last Flu Vaccine:  Fluvax 3+ (02/23/2010)   Last Pneumovax:  Pneumovax (Medicare) (04/23/2009)  Lipid Management  Cholesterol:  146 (11/24/2009)   LDL (bad choesterol):  53 (12/26/2006)   HDL (good cholesterol):  30.30 (11/24/2009)  CBC   WBC:  7.4 (12/13/2008)   RBC:  4.18 (12/13/2008)   Hgb:  13.1 (12/13/2008)   Hct:  37.8 (12/13/2008)   Platelets:  153 (12/13/2008)   MCV  90.5 (12/13/2008)   MCHC  34.5 (12/13/2008)   RDW  14.3 (12/13/2008)  Complete Metabolic Panel   Glucose:  115 (03/19/2009)   Sodium:  141 (03/19/2009)   Potassium:  3.8 (03/19/2009)   Chloride:  102 (03/19/2009)   CO2:  31 (03/19/2009)   BUN:  13 (03/19/2009)   Creatinine:  1.3 (03/19/2009)   Albumin:  4.1 (11/24/2009)   Total Protein:  7.1 (11/24/2009)   Calcium:  9.0 (03/19/2009)   Total Bili:  1.0 (11/24/2009)   Alk Phos:  77 (11/24/2009)   SGPT (ALT):  15 (11/24/2009)   SGOT (AST):  19 (11/24/2009)   Current Medications (verified): 1)  Carvedilol 6.25 Mg Tabs (Carvedilol) .... Take One Tablet By Mouth Twice A Day 2)  Aspirin 81 Mg Tbec (Aspirin) .... Take One Tablet By Mouth Daily 3)  Hyzaar 50-12.5 Mg Tabs (Losartan Potassium-Hctz) .Marland Kitchen.. 1 Tab Daily 4)  Omeprazole 20 Mg Cpdr (Omeprazole) .Marland Kitchen.. 1 Tab By Mouth Once Daily 5)  Imdur 30 Mg Xr24h-Tab (Isosorbide Mononitrate) .... Once Daily 6)  Simvastatin 40 Mg Tabs (Simvastatin) .Marland Kitchen.. 1 Tab Qhs 7)  Nasal Spray .... As Needed 8)  Hydrocodone-Acetaminophen 5-325 Mg Tabs (Hydrocodone-Acetaminophen) .... As Needed 9)  Vitamin B-12  (Cyanocobalamin) .Marland Kitchen.. 1 Tab By Mouth Once Daily 10)  Furosemide 20 Mg Tabs (Furosemide) .... Take 1/2 By Mouth Qd 11)  Vesicare 5 Mg Tabs (Solifenacin Succinate) .... Take 1 Po Qd 12)  Naproxen 500 Mg Tabs (Naproxen) .... Take 1 By Mouth Qd 13)  Fluticasone Propionate 50 Mcg/act Susp (Fluticasone Propionate) .Marland Kitchen.. 1 Spray Each Nostril Once Daily Prn 14)  Flomax 0.4 Mg Caps (Tamsulosin Hcl) .Marland Kitchen.. 1 By Mouth Once Daily 15)  Avodart 0.5 Mg Caps (Dutasteride) .Marland Kitchen.. 1 By Mouth Once Daily  Allergies (verified): 1)  ! Penicillin  Past  History:  Past Medical History: CAD CHF: systolic dysfunction with old IMI Hypertension Hyperlipidemia BPH  Barrett's Esophagus Nephrolithiasis, hx of Colonic Polyps Diverticulitis, hx of GERD  MD roster: cards - taylor uro -grapey GI -prev orr, now stark pain - ramos  Review of Systems  The patient denies chest pain, syncope, and headaches.    Physical Exam  General:  alert, well-developed, well-nourished, and cooperative to examination.   wife at side Lungs:  normal respiratory effort, no intercostal retractions or use of accessory muscles; normal breath sounds bilaterally - no crackles and no wheezes.    Heart:  normal rate, regular rhythm, no murmur, and no rub. BLE without edema.  Psych:  Oriented X3, memory intact for recent and remote, normally interactive, good eye contact, not anxious appearing, not depressed appearing, and not agitated.      Impression & Recommendations:  Problem # 1:  HYPERCHOLESTEROLEMIA, MIXED (ICD-272.0)  His updated medication list for this problem includes:    Simvastatin 40 Mg Tabs (Simvastatin) .Marland Kitchen... 1 tab qhs  Labs Reviewed: SGOT: 19 (11/24/2009)   SGPT: 15 (11/24/2009)   HDL:30.30 (11/24/2009), 21.6 (12/26/2006)  LDL:53 (12/26/2006)  Chol:146 (11/24/2009), 96 (12/26/2006)  Trig:278.0 (11/24/2009), 109 (12/26/2006)  Problem # 2:  HYPERTENSION (ICD-401.9)  His updated medication list for this problem includes:    Carvedilol 6.25 Mg Tabs (Carvedilol) .Marland Kitchen... Take one tablet by mouth twice a day    Hyzaar 50-12.5 Mg Tabs (Losartan potassium-hctz) .Marland Kitchen... 1 tab daily    Furosemide 20 Mg Tabs (Furosemide) .Marland Kitchen... Take 1/2 by mouth qd  BP today: 110/60 Prior BP: 110/58 (11/24/2009)  Labs Reviewed: K+: 3.8 (03/19/2009) Creat: : 1.3 (03/19/2009)   Chol: 146 (11/24/2009)   HDL: 30.30 (11/24/2009)   LDL: 53 (12/26/2006)   TG: 278.0 (11/24/2009)  Problem # 3:  ROTATOR CUFF INJURY, RIGHT SHOULDER (ICD-726.10)   F/U Dr Margaretha Sheffield and Ethelene Hal as  ongoing.  Significant decreased ROM unable to lift arm over head bilaterally use hydrocodone as needed   Problem # 4:  BENIGN PROSTATIC HYPERTROPHY, WITH OBSTRUCTION (ICD-600.01)  His updated medication list for this problem includes:    Flomax 0.4 Mg Caps (Tamsulosin hcl) .Marland Kitchen... 1 by mouth once daily    Avodart 0.5 Mg Caps (Dutasteride) .Marland Kitchen... 1 by mouth once daily follow with uro - med mgmt as ongoing  Problem # 5:  CHF (ICD-428.0)  His updated medication list for this problem includes:    Carvedilol 6.25 Mg Tabs (Carvedilol) .Marland Kitchen... Take one tablet by mouth twice a day    Aspirin 81 Mg Tbec (Aspirin) .Marland Kitchen... Take one tablet by mouth daily    Hyzaar 50-12.5 Mg Tabs (Losartan potassium-hctz) .Marland Kitchen... 1 tab daily    Furosemide 20 Mg Tabs (  Furosemide) .Marland Kitchen... Take 1/2 by mouth qd  Stable functional class 2.  f/u cards as ongoing - cont med mgmt  Complete Medication List: 1)  Carvedilol 6.25 Mg Tabs (Carvedilol) .... Take one tablet by mouth twice a day 2)  Aspirin 81 Mg Tbec (Aspirin) .... Take one tablet by mouth daily 3)  Hyzaar 50-12.5 Mg Tabs (Losartan potassium-hctz) .Marland Kitchen.. 1 tab daily 4)  Omeprazole 20 Mg Cpdr (Omeprazole) .Marland Kitchen.. 1 tab by mouth once daily 5)  Imdur 30 Mg Xr24h-tab (Isosorbide mononitrate) .... Once daily 6)  Simvastatin 40 Mg Tabs (Simvastatin) .Marland Kitchen.. 1 tab qhs 7)  Nasal Spray  .... As needed 8)  Hydrocodone-acetaminophen 5-325 Mg Tabs (Hydrocodone-acetaminophen) .... As needed 9)  Vitamin B-12 (cyanocobalamin)  .Marland Kitchen.. 1 tab by mouth once daily 10)  Furosemide 20 Mg Tabs (Furosemide) .... Take 1/2 by mouth qd 11)  Vesicare 5 Mg Tabs (Solifenacin succinate) .... Take 1 po qd 12)  Naproxen 500 Mg Tabs (Naproxen) .... Take 1 by mouth qd 13)  Fluticasone Propionate 50 Mcg/act Susp (Fluticasone propionate) .Marland Kitchen.. 1 spray each nostril once daily prn 14)  Flomax 0.4 Mg Caps (Tamsulosin hcl) .Marland Kitchen.. 1 by mouth once daily 15)  Avodart 0.5 Mg Caps (Dutasteride) .Marland Kitchen.. 1 by mouth once  daily  Other Orders: Flu Vaccine 24yrs + MEDICARE PATIENTS (Y4034) Administration Flu vaccine - MCR (V4259) Flu Vaccine Consent Questions     Do you have a history of severe allergic reactions to this vaccine? no    Any prior history of allergic reactions to egg and/or gelatin? no    Do you have a sensitivity to the preservative Thimersol? no    Do you have a past history of Guillan-Barre Syndrome? no    Do you currently have an acute febrile illness? no    Have you ever had a severe reaction to latex? no    Vaccine information given and explained to patient? yes    Are you currently pregnant? no    Lot Number:[id number]   Exp Date:12/12/2010   Site Given  Left Deltoid IM  Patient Instructions: 1)  it was good to see you today. 2)  medications reviewed - no changes - 3)  keep followup as planned with cardiology, GI and dr. Ethelene Hal or draper as discussed 4)  Please schedule a follow-up appointment in 3-4 months, sooner if problems.    Marland Kitchenlbmedflu

## 2010-07-14 NOTE — Letter (Signed)
Summary: [facility] Instructions  [facility]  [address] [place], [place] [address]   Phone: [phone]  Fax: [phone]       Marcia Cambria    1932-03-28    MRN: [id number]        Procedure Day Dorna Bloom:  Farrell Ours  01/16/10     Arrival Time:  2:00PM     Procedure Time:  3:00PM     Location of Procedure:                    Juliann Pares _  [facility] (4th Floor)                        PREPARATION FOR COLONOSCOPY WITH MOVIPREP   Starting 5 days prior to your procedure 01/11/10 do not eat nuts, seeds, popcorn, corn, beans, peas,  salads, or any raw vegetables.  Do not take any fiber supplements (e.g. Metamucil, Citrucel, and Benefiber).  THE DAY BEFORE YOUR PROCEDURE         DATE: 01/15/10  DAY: THURSDAY  1.  Drink clear liquids the entire day-NO SOLID FOOD  2.  Do not drink anything colored red or purple.  Avoid juices with pulp.  No orange juice.  3.  Drink at least 64 oz. (8 glasses) of fluid/clear liquids during the day to prevent dehydration and help the prep work efficiently.  CLEAR LIQUIDS INCLUDE: Water Jello Ice Popsicles Tea (sugar ok, no milk/cream) Powdered fruit flavored drinks Coffee (sugar ok, no milk/cream) Gatorade Juice: apple, white grape, white cranberry  Lemonade Clear bullion, consomm, broth Carbonated beverages (any kind) Strained chicken noodle soup Hard Candy                             4.  In the morning, mix first dose of MoviPrep solution:    Empty 1 Pouch A and 1 Pouch B into the disposable container    Add lukewarm drinking water to the top line of the container. Mix to dissolve    Refrigerate (mixed solution should be used within 24 hrs)  5.  Begin drinking the prep at 5:00 p.m. The MoviPrep container is divided by 4 marks.   Every 15 minutes drink the solution down to the next mark (approximately 8 oz) until the full liter is complete.   6.  Follow completed prep with 16 oz of clear liquid of your choice (Nothing  red or purple).  Continue to drink clear liquids until bedtime.  7.  Before going to bed, mix second dose of MoviPrep solution:    Empty 1 Pouch A and 1 Pouch B into the disposable container    Add lukewarm drinking water to the top line of the container. Mix to dissolve    Refrigerate  THE DAY OF YOUR PROCEDURE      DATE: 01/16/10  DAY: FRIDAY  Beginning at 10:00AM (5 hours before procedure):         1. Every 15 minutes, drink the solution down to the next mark (approx 8 oz) until the full liter is complete.  2. Follow completed prep with 16 oz. of clear liquid of your choice.    3. You may drink clear liquids until 1:00PM (2 HOURS BEFORE PROCEDURE).   MEDICATION INSTRUCTIONS  Unless otherwise instructed, you should take regular prescription medications with a small sip of water   as early as possible the morning  of your procedure.    Additional medication instructions:  Hold Furosemide the morning of procedure.         OTHER INSTRUCTIONS  You will need a responsible adult at least 75 years of age to accompany you and drive you home.   This person must remain in the waiting room during your procedure.  Wear loose fitting clothing that is easily removed.  Leave jewelry and other valuables at home.  However, you may wish to bring a book to read or  an iPod/MP3 player to listen to music as you wait for your procedure to start.  Remove all body piercing jewelry and leave at home.  Total time from sign-in until discharge is approximately 2-3 hours.  You should go home directly after your procedure and rest.  You can resume normal activities the  day after your procedure.  The day of your procedure you should not:   Drive   Make legal decisions   Operate machinery   Drink alcohol   Return to work  You will receive specific instructions about eating, activities and medications before you leave.    The above instructions have been reviewed and explained to me  by   Wyona Almas RN  January 09, 2010 11:14 AM     I fully understand and can verbalize these instructions _____________________________ Date _________

## 2010-07-14 NOTE — Letter (Signed)
Summary: [facility]  [facility]   Imported By: Debby Freiberg 12/17/2009 09:33:44  _____________________________________________________________________  External Attachment:    Type:   Image     Comment:   External Document

## 2010-07-14 NOTE — Assessment & Plan Note (Signed)
Summary: F6M/DM   Primary Provider:  Newt Lukes MD  CC:  dizziness.  History of Present Illness: [name] returns today for followup.  He is a pleasant 77 yo man with a h/o an ICM, EF 25%, class 1 CHF, HTN and dyslipidemia.  He denies c/p or sob.  He does have problems with arthritis in his knees.  He has had no intercurrent ICD therapies.  He was recathed in July 2010 and had a chronically occluded RCA with collaterals and no progression of left sided disease. He had relief of right rotator cuff injury with injections but may need surgery for left rotator cuff injury with Dr Ranell Patrick.  He is clear to have this from a cardiac perspective.  I have contacted [employer] to make sure they are available to turn his AICD on and off as the surgical field will likely be very close to his AICD  Current Problems (verified): 1)  Rotator Cuff Injury, Right Shoulder  (ICD-726.10) 2)  Hypercholesterolemia, Mixed  (ICD-272.0) 3)  Hypertension  (ICD-401.9) 4)  Coronary Atherosclerosis, Native Vessel  (ICD-414.01) 5)  CHF  (ICD-428.0) 6)  Automatic Implantable Cardiac Defibrillator Situ  (ICD-V45.02) 7)  Gerd  (ICD-530.81) 8)  Benign Prostatic Hypertrophy, With Obstruction  (ICD-600.01) 9)  Arthritis  (ICD-716.90) 10)  Urinary Incontinence  (ICD-788.30) 11)  Diverticulitis, Hx of  (ICD-V12.79) 12)  Encounter For Long-term Use of Other Medications  (ICD-V58.69) 13)  Hypokalemia  (ICD-276.8) 14)  Hypertensive Heart Disease, Benign, With CHF  (ICD-402.11)  Current Medications (verified): 1)  Carvedilol 6.25 Mg Tabs (Carvedilol) .... Take One Tablet By Mouth Twice A Day 2)  Aspirin 81 Mg Tbec (Aspirin) .... Take One Tablet By Mouth Daily 3)  Hyzaar 50-12.5 Mg Tabs (Losartan Potassium-Hctz) .Marland Kitchen.. 1 Tab Daily 4)  Omeprazole 20 Mg Cpdr (Omeprazole) .Marland Kitchen.. 1 Tab By Mouth Once Daily 5)  Imdur 30 Mg Xr24h-Tab (Isosorbide Mononitrate) .... Once Daily 6)  Simvastatin 40 Mg Tabs (Simvastatin) .Marland Kitchen.. 1 Tab Qhs 7)   Hydrocodone-Acetaminophen 5-325 Mg Tabs (Hydrocodone-Acetaminophen) .... As Needed 8)  Vitamin B-12  (Cyanocobalamin) .Marland Kitchen.. 1 Tab By Mouth Once Daily 9)  Furosemide 20 Mg Tabs (Furosemide) .... Take 1/2 By Mouth Qd 10)  Vesicare 5 Mg Tabs (Solifenacin Succinate) .... Take 1 Po Qd 11)  Fluticasone Propionate 50 Mcg/act Susp (Fluticasone Propionate) .Marland Kitchen.. 1 Spray Each Nostril Once Daily Prn 12)  Flomax 0.4 Mg Caps (Tamsulosin Hcl) .Marland Kitchen.. 1 By Mouth Once Daily 13)  Avodart 0.5 Mg Caps (Dutasteride) .Marland Kitchen.. 1 By Mouth Once Daily  Allergies (verified): 1)  ! Penicillin  Past History:  Past Medical History: Last updated: 02/23/2010 CAD CHF: systolic dysfunction with old IMI Hypertension Hyperlipidemia BPH  Barrett's Esophagus Nephrolithiasis, hx of Colonic Polyps Diverticulitis, hx of GERD  MD roster: cards - taylor uro -grapey GI -prev orr, now stark pain - ramos  Past Surgical History: Last updated: 04/22/2008 AICD:  Ladona Ridgel 12/2006  Family History: Last updated: 04/23/2009 Family History of Arthritis (parent) Family History Hypertension (parent) Heart disease (parent)  Social History: Last updated: 04/22/2008 Married Retired Non-smoker Non-drinker  Review of Systems       Denies fever, malais, weight loss, blurry vision, decreased visual acuity, cough, sputum, SOB, hemoptysis, pleuritic pain, palpitaitons, heartburn, abdominal pain, melena, lower extremity edema, claudication, or rash.   Vital Signs:  Patient profile:   77 year old male Height:      70 inches Weight:      196 pounds BMI:  28.22 Pulse rate:   68 / minute Resp:     12 per minute BP sitting:   117 / 66  (left arm)  Vitals Entered By: Kem Parkinson (March 18, 2010 11:59 AM)  Physical Exam  General:  Affect appropriate Healthy:  appears stated age HEENT: normal Neck supple with no adenopathy JVP normal no bruits no thyromegaly Lungs clear with no wheezing and good diaphragmatic  motion Heart:  S1/S2  SEM no rub, gallop or click PMI enlarged  Abdomen: benighn, BS positve, no tenderness, no AAA no bruit.  No HSM or HJR Distal pulses intact with no bruits No edema Neuro non-focal Skin warm and dry AICD under left clavicle    ICD Specifications Following MD:  Lewayne Bunting, MD     Referring MD:  [facility] ICD Vendor:  [facility] [employer]     ICD Model Number:  [phone]     ICD Serial Number:  [id number] ICD DOI:  12/28/2006     ICD Implanting MD:  Lewayne Bunting, MD  Lead 1:    Location: RA     DOI: 12/28/2006     Model #: [id number]     Serial #: [id number]     Status: active Lead 2:    Location: RV     DOI: 12/28/2006     Model #: [id number]     Serial #: [id number]     Status: active  Indications::  ICM   ICD Follow Up ICD Dependent:  No      Episodes Coumadin:  No  Brady Parameters Mode DDD     Lower Rate Limit:  60     Upper Rate Limit 110 PAV 400     Sensed AV Delay:  290  Tachy Zones VF:  210     VT:  180     Impression & Recommendations:  Problem # 1:  ROTATOR CUFF INJURY, RIGHT SHOULDER (ICD-726.10) Clear to have surgery.l  [employer] to arrange to have AICD turned on /off.  Low risk for acute ischemic event given cath 2010 with stable disease.  CHF compensated.    Problem # 2:  HYPERTENSION (ICD-401.9) Well controlled His updated medication list for this problem includes:    Carvedilol 6.25 Mg Tabs (Carvedilol) .Marland Kitchen... Take one tablet by mouth twice a day    Aspirin 81 Mg Tbec (Aspirin) .Marland Kitchen... Take one tablet by mouth daily    Hyzaar 50-12.5 Mg Tabs (Losartan potassium-hctz) .Marland Kitchen... 1 tab daily    Furosemide 20 Mg Tabs (Furosemide) .Marland Kitchen... Take 1/2 by mouth qd  Problem # 3:  CORONARY ATHEROSCLEROSIS, NATIVE VESSEL (ICD-414.01) Stable no angina continue ASA and BB His updated medication list for this problem includes:    Carvedilol 6.25 Mg Tabs (Carvedilol) .Marland Kitchen... Take one tablet by mouth twice a day    Aspirin 81 Mg Tbec (Aspirin) .Marland Kitchen... Take one tablet by mouth daily    Imdur 30  Mg Xr24h-tab (Isosorbide mononitrate) ..... Once daily  Problem # 4:  CHF (ICD-428.0) Euvolemic functional class one.  Take diuretic morning of surgeyr His updated medication list for this problem includes:    Carvedilol 6.25 Mg Tabs (Carvedilol) .Marland Kitchen... Take one tablet by mouth twice a day    Aspirin 81 Mg Tbec (Aspirin) .Marland Kitchen... Take one tablet by mouth daily    Hyzaar 50-12.5 Mg Tabs (Losartan potassium-hctz) .Marland Kitchen... 1 tab daily    Imdur 30 Mg Xr24h-tab (Isosorbide mononitrate) ..... Once daily    Furosemide 20 Mg Tabs (Furosemide) .Marland KitchenMarland KitchenMarland KitchenMarland Kitchen  Take 1/2 by mouth qd  Orders: EKG w/ Interpretation (93000)  Patient Instructions: 1)  Your physician recommends that you schedule a follow-up appointment in: 6 MONTHS WITH DR Eden Emms 2)  Your physician recommends that you continue on your current medications as directed. Please refer to the Current Medication list given to you today.   EKG Report  Procedure date:  03/18/2010  Findings:      NSR 68 IVCD No change from previous

## 2010-07-14 NOTE — Assessment & Plan Note (Signed)
Summary: 3-4 MTH FU STC   Vital Signs:  Patient profile:   77 year old male Height:      70 inches (177.80 cm) Weight:      205.12 pounds (93.24 kg) O2 Sat:      94 % on Room air Temp:     97.1 degrees F (36.17 degrees C) oral Pulse rate:   96 / minute BP sitting:   110 / 58  (left arm) Cuff size:   regular  Vitals Entered By: Orlan Leavens (November 24, 2009 11:28 AM)  O2 Flow:  Room air CC: 3 month follow-up Is Patient Diabetic? No Pain Assessment Patient in pain? no        Primary Care Provider:  Newt Lukes MD  CC:  3 month follow-up.  History of Present Illness: here for 3 mo f/u - requests handicap tag form completed  c/o arthritis both shoulders, right>left, knees and hips taking naproxen 500 with some improvement - saw ortho for same - had steroid injection with temp relief  CHF/CAD - follows with [facility] cards for same - [facility]ishan/taylor/[facility] clinic reports compliance with ongoing medical treatment and no changes in medication dose or frequency. denies adverse side effects related to current therapy. no cp, SOB or edema -   HTN -  reports compliance with ongoing medical treatment and no changes in medication dose or frequency. denies adverse side effects related to current therapy.  no HA or vision changes  dyslipidemia - reports compliance with ongoing medical treatment and no changes in medication dose or frequency. denies adverse side effects related to current therapy. no GI or muscle c/o  BPH - follows with dr. Isabel Caprice -uro recent changes in meds reviewed - feels bladder emptying better hard time getting meds approved by insurance so has resumed avodart and flomax until meds cleared - needs refills on same   Clinical Review Panels:  Lipid Management   Cholesterol:  96 (12/26/2006)   LDL (bad choesterol):  53 (12/26/2006)   HDL (good cholesterol):  21.6 (12/26/2006)  CBC   WBC:  7.4 (12/13/2008)   RBC:  4.18 (12/13/2008)   Hgb:  13.1 (12/13/2008)   Hct:  37.8 (12/13/2008)   Platelets:  153 (12/13/2008)   MCV  90.5 (12/13/2008)   MCHC  34.5 (12/13/2008)   RDW  14.3 (12/13/2008)  Complete Metabolic Panel   Glucose:  115 (03/19/2009)   Sodium:  141 (03/19/2009)   Potassium:  3.8 (03/19/2009)   Chloride:  102 (03/19/2009)   CO2:  31 (03/19/2009)   BUN:  13 (03/19/2009)   Creatinine:  1.3 (03/19/2009)   Albumin:  3.6 (12/26/2006)   Total Protein:  6.9 (12/26/2006)   Calcium:  9.0 (03/19/2009)   Total Bili:  0.9 (12/26/2006)   Alk Phos:  91 (12/26/2006)   SGPT (ALT):  15 (12/26/2006)   SGOT (AST):  17 (12/26/2006)   Current Medications (verified): 1)  Carvedilol 6.25 Mg Tabs (Carvedilol) .... Take One Tablet By Mouth Twice A Day 2)  Aspirin 81 Mg Tbec (Aspirin) .... Take One Tablet By Mouth Daily 3)  Hyzaar 50-12.5 Mg Tabs (Losartan Potassium-Hctz) .Marland Kitchen.. 1 Tab Daily 4)  Omeprazole 20 Mg Cpdr (Omeprazole) .Marland Kitchen.. 1 Tab By Mouth Once Daily 5)  Imdur 30 Mg Xr24h-Tab (Isosorbide Mononitrate) .... Once Daily 6)  Simvastatin 40 Mg Tabs (Simvastatin) .Marland Kitchen.. 1 Tab Qhs 7)  Nasal Spray .... As Needed 8)  Hydrocodone-Acetaminophen 5-325 Mg Tabs (Hydrocodone-Acetaminophen) .... As Needed 9)  Vitamin B-12  (Cyanocobalamin) .Marland KitchenMarland KitchenMarland Kitchen  1 Tab By Mouth Once Daily 10)  Furosemide 20 Mg Tabs (Furosemide) .... Take 1/2 By Mouth Qd 11)  Vesicare 5 Mg Tabs (Solifenacin Succinate) .... Take 1 Po Qd 12)  Naproxen 500 Mg Tabs (Naproxen) .... Take 1 By Mouth Qd 13)  Fluticasone Propionate 50 Mcg/act Susp (Fluticasone Propionate) .Marland Kitchen.. 1 Spray Each Nostril Once Daily Prn 14)  Flomax 0.4 Mg Caps (Tamsulosin Hcl) .Marland Kitchen.. 1 By Mouth Once Daily 15)  Avodart 0.5 Mg Caps (Dutasteride) .Marland Kitchen.. 1 By Mouth Once Daily  Allergies (verified): 1)  ! Penicillin  Past History:  Past Medical History: CAD CHF: systolic dysfunction with old IMI Hypertension Hyperlipidemia BPH Barrett's Esophagus Nephrolithiasis, hx of Colonic Polyps Diverticulitis, hx of GERD  MD roster  - cards - taylor uro -grapey GI -orr pain - ramos  Review of Systems  The patient denies anorexia, fever, weight loss, weight gain, hoarseness, syncope, and abdominal pain.    Physical Exam  General:  alert, well-developed, well-nourished, and cooperative to examination.   wife at side Ears:  HOH - chronic Lungs:  normal respiratory effort, no intercostal retractions or use of accessory muscles; normal breath sounds bilaterally - no crackles and no wheezes.    Heart:  normal rate, regular rhythm, no murmur, and no rub. BLE without edema.  Msk:  limited abduction of right shoulder due to pain but also reduced PROM Neurologic:  alert & oriented X3 and cranial nerves II-XII symetrically intact.  strength normal in all extremities, sensation intact to light touch, and gait careful but grossly norma with cane assist. speech fluent without dysarthria or aphasia; follows commands with good comprehension.  Psych:  Oriented X3, memory intact for recent and remote, normally interactive, good eye contact, not anxious appearing, not depressed appearing, and not agitated.      Impression & Recommendations:  Problem # 1:  HYPERTENSION (ICD-401.9)  His updated medication list for this problem includes:    Carvedilol 6.25 Mg Tabs (Carvedilol) .Marland Kitchen... Take one tablet by mouth twice a day    Hyzaar 50-12.5 Mg Tabs (Losartan potassium-hctz) .Marland Kitchen... 1 tab daily    Furosemide 20 Mg Tabs (Furosemide) .Marland Kitchen... Take 1/2 by mouth qd  BP today: 110/58 Prior BP: 100/57 (09/16/2009)  Labs Reviewed: K+: 3.8 (03/19/2009) Creat: : 1.3 (03/19/2009)   Chol: 96 (12/26/2006)   HDL: 21.6 (12/26/2006)   LDL: 53 (12/26/2006)   TG: 109 (12/26/2006)  Problem # 2:  HYPERCHOLESTEROLEMIA, MIXED (ICD-272.0) check flp and lfts - His updated medication list for this problem includes:    Simvastatin 40 Mg Tabs (Simvastatin) .Marland Kitchen... 1 tab qhs  Orders: TLB-Lipid Panel ([id number])  Labs Reviewed: SGOT: 17 (12/26/2006)   SGPT:  15 (12/26/2006)   HDL:21.6 (12/26/2006)  LDL:53 (12/26/2006)  Chol:96 (12/26/2006)  Trig:109 (12/26/2006)  Problem # 3:  CORONARY ATHEROSCLEROSIS, NATIVE VESSEL (ICD-414.01)  stable, no angina cont current meds and follow with cards as ongoing His updated medication list for this problem includes:    Carvedilol 6.25 Mg Tabs (Carvedilol) .Marland Kitchen... Take one tablet by mouth twice a day    Aspirin 81 Mg Tbec (Aspirin) .Marland Kitchen... Take one tablet by mouth daily    Hyzaar 50-12.5 Mg Tabs (Losartan potassium-hctz) .Marland Kitchen... 1 tab daily    Imdur 30 Mg Xr24h-tab (Isosorbide mononitrate) ..... Once daily    Furosemide 20 Mg Tabs (Furosemide) .Marland Kitchen... Take 1/2 by mouth qd  Labs Reviewed: Chol: 96 (12/26/2006)   HDL: 21.6 (12/26/2006)   LDL: 53 (12/26/2006)   TG: 109 (12/26/2006)  Problem #  4:  BENIGN PROSTATIC HYPERTROPHY, WITH OBSTRUCTION (ICD-600.01)  His updated medication list for this problem includes:    Flomax 0.4 Mg Caps (Tamsulosin hcl) .Marland Kitchen... 1 by mouth once daily    Avodart 0.5 Mg Caps (Dutasteride) .Marland Kitchen... 1 by mouth once daily  not regularly taking jalyn due to insurance issues -  continue flomax and avodart until those issues settled- or as per uro -  Problem # 5:  ARTHRITIS (ICD-716.90) temp application for handicap done today - Time spent with patient/wife 25 minutes, more than 50% of this time was spent counseling patient on medications and form completion  Complete Medication List: 1)  Carvedilol 6.25 Mg Tabs (Carvedilol) .... Take one tablet by mouth twice a day 2)  Aspirin 81 Mg Tbec (Aspirin) .... Take one tablet by mouth daily 3)  Hyzaar 50-12.5 Mg Tabs (Losartan potassium-hctz) .Marland Kitchen.. 1 tab daily 4)  Omeprazole 20 Mg Cpdr (Omeprazole) .Marland Kitchen.. 1 tab by mouth once daily 5)  Imdur 30 Mg Xr24h-tab (Isosorbide mononitrate) .... Once daily 6)  Simvastatin 40 Mg Tabs (Simvastatin) .Marland Kitchen.. 1 tab qhs 7)  Nasal Spray  .... As needed 8)  Hydrocodone-acetaminophen 5-325 Mg Tabs (Hydrocodone-acetaminophen)  .... As needed 9)  Vitamin B-12 (cyanocobalamin)  .Marland Kitchen.. 1 tab by mouth once daily 10)  Furosemide 20 Mg Tabs (Furosemide) .... Take 1/2 by mouth qd 11)  Vesicare 5 Mg Tabs (Solifenacin succinate) .... Take 1 po qd 12)  Naproxen 500 Mg Tabs (Naproxen) .... Take 1 by mouth qd 13)  Fluticasone Propionate 50 Mcg/act Susp (Fluticasone propionate) .Marland Kitchen.. 1 spray each nostril once daily prn 14)  Flomax 0.4 Mg Caps (Tamsulosin hcl) .Marland Kitchen.. 1 by mouth once daily 15)  Avodart 0.5 Mg Caps (Dutasteride) .Marland Kitchen.. 1 by mouth once daily  Other Orders: TLB-Hepatic/Liver Function Pnl ([id number])  Patient Instructions: 1)  it was good to see you today. 2)  test(s) ordered today - your results will be posted on the phone tree for review in 48-72 hours from the time of test completion; call [phone] and enter your 9 digit MRN (listed above on this page, just below your name); if any changes need to be made or there are abnormal results, you will be contacted directly.  3)  medications reviewed - no changes - 4)  form for temporary disablity signed and given to you today 5)  Please schedule a follow-up appointment in 3-4 months, sooner if problems.

## 2010-07-14 NOTE — Consult Note (Signed)
Summary: [facility]  [facility]   Imported By: Lester [place] 09/03/2009 08:32:40  _____________________________________________________________________  External Attachment:    Type:   Image     Comment:   External Document

## 2010-07-14 NOTE — Letter (Signed)
Summary: [facility]   Imported By: Kassie Mends 09/10/2009 10:20:16  _____________________________________________________________________  External Attachment:    Type:   Image     Comment:   External Document

## 2010-07-14 NOTE — Assessment & Plan Note (Signed)
Summary: PC2   Visit Type:  Follow-up Primary Provider:  Newt Lukes MD   History of Present Illness: Jacob [name] returns today for followup.  He is a pleasant 76 yo man with a h/o an ICM, EF 25%, class 1 CHF, HTN and dyslipidemia.  He denies c/p or palpitations but has noted some sob.  He does have problems with arthritis in his knees.    Current Medications (verified): 1)  Carvedilol 6.25 Mg Tabs (Carvedilol) .... Take One Tablet By Mouth Twice A Day 2)  Aspirin 81 Mg Tbec (Aspirin) .... Take One Tablet By Mouth Daily 3)  Hyzaar 50-12.5 Mg Tabs (Losartan Potassium-Hctz) .Marland Kitchen.. 1 Tab Daily 4)  Omeprazole 20 Mg Cpdr (Omeprazole) .Marland Kitchen.. 1 Tab By Mouth Once Daily 5)  Imdur 30 Mg Xr24h-Tab (Isosorbide Mononitrate) .... Once Daily 6)  Simvastatin 40 Mg Tabs (Simvastatin) .Marland Kitchen.. 1 Tab Qhs 7)  Hydrocodone-Acetaminophen 5-325 Mg Tabs (Hydrocodone-Acetaminophen) .... As Needed 8)  Vitamin B-12  (Cyanocobalamin) .Marland Kitchen.. 1 Tab By Mouth Once Daily 9)  Furosemide 20 Mg Tabs (Furosemide) .... Take 1/2 By Mouth Qd 10)  Vesicare 5 Mg Tabs (Solifenacin Succinate) .... Take 1 Po Qd 11)  Fluticasone Propionate 50 Mcg/act Susp (Fluticasone Propionate) .Marland Kitchen.. 1 Spray Each Nostril Once Daily Prn 12)  Flomax 0.4 Mg Caps (Tamsulosin Hcl) .Marland Kitchen.. 1 By Mouth Once Daily 13)  Avodart 0.5 Mg Caps (Dutasteride) .Marland Kitchen.. 1 By Mouth Once Daily  Allergies: 1)  ! Penicillin  Past History:  Past Medical History: Last updated: 02/23/2010 CAD CHF: systolic dysfunction with old IMI Hypertension Hyperlipidemia BPH  Barrett's Esophagus Nephrolithiasis, hx of Colonic Polyps Diverticulitis, hx of GERD  MD roster: cards - taylor uro -grapey GI -prev orr, now stark pain - ramos  Past Surgical History: Last updated: 04/22/2008 AICD:  Ladona Ridgel 12/2006  Review of Systems       The patient complains of dyspnea on exertion.  The patient denies chest pain, syncope, and peripheral edema.    Vital Signs:  Patient profile:    77 year old male Height:      70 inches Weight:      195 pounds BMI:     28.08 Pulse rate:   82 / minute BP sitting:   92 / 50  (left arm)  Vitals Entered By: Laurance Flatten CMA (April 28, 2010 11:29 AM)  Physical Exam  General:  Affect appropriate Healthy:  appears stated age HEENT: normal Neck supple with no adenopathy JVP normal no bruits no thyromegaly Lungs clear with no wheezing and good diaphragmatic motion Heart: IRIR with a normal  S1/S2  SEM no rub, gallop or click PMI enlarged  Abdomen: benighn, BS positve, no tenderness, no AAA no bruit.  No HSM or HJR Distal pulses intact with no bruits No edema Neuro non-focal Skin warm and dry AICD under left clavicle    ICD Specifications Following MD:  Lewayne Bunting, MD     Referring MD:  [place] ICD Vendor:  [facility] [employer]     ICD Model Number:  [id number]     ICD Serial Number:  [id number] ICD DOI:  12/28/2006     ICD Implanting MD:  Lewayne Bunting, MD  Lead 1:    Location: RA     DOI: 12/28/2006     Model #: 9147     Serial #: [id number]     Status: active Lead 2:    Location: RV     DOI: 12/28/2006     Model #: 1308  Serial #: [id number]     Status: active  Indications::  ICM   ICD Follow Up Battery Voltage:  2.59 V     Charge Time:  8.7 seconds     Underlying rhythm:  SR @ 60 ICD Dependent:  No       ICD Device Measurements Atrium:  Amplitude: 1.3 mV, Impedance: 613 ohms, Threshold: 0.8 V at 0.6 msec Right Ventricle:  Amplitude: 6.9 mV, Impedance: 953 ohms, Threshold: 0.6 V at 0.6 msec Shock Impedance: 43 ohms   Episodes MS Episodes:  1     Percent Mode Switch:  <1%     Coumadin:  No Shock:  0     ATP:  0     Nonsustained:  0     Atrial Therapies:  0 Atrial Pacing:  12%     Ventricular Pacing:  0%  Brady Parameters Mode DDD     Lower Rate Limit:  60     Upper Rate Limit 110 PAV 400     Sensed AV Delay:  290  Tachy Zones VF:  210     VT:  180     Next Remote Date:  07/15/2010     Next Cardiology Appt Due:   04/15/2011 Tech Comments:  NORMAL DEVICE FUNCTION. 1 MODE SWITCH LASTING 24 MINUTES ON 03-01-10.  NO CHANGES MADE. LATITUDE 07-30-10 AND ROV IN 12 MTHS W/GT. Vella Kohler  April 28, 2010 11:57 AM MD Comments:  Agree with above.  Impression & Recommendations:  Problem # 1:  AUTOMATIC IMPLANTABLE CARDIAC DEFIBRILLATOR SITU (ICD-V45.02) His device is working normally.  Will recheck in several months.  Problem # 2:  CHF (ICD-428.0) His symptoms have gone from class 1-2.  I have recommended A low sodium diet. His updated medication list for this problem includes:    Carvedilol 6.25 Mg Tabs (Carvedilol) .Marland Kitchen... Take one tablet by mouth twice a day    Aspirin 81 Mg Tbec (Aspirin) .Marland Kitchen... Take one tablet by mouth daily    Hyzaar 50-12.5 Mg Tabs (Losartan potassium-hctz) .Marland Kitchen... 1 tab daily    Imdur 30 Mg Xr24h-tab (Isosorbide mononitrate) ..... Once daily    Furosemide 20 Mg Tabs (Furosemide) .Marland Kitchen... Take 1/2 by mouth qd  Problem # 3:  ATRIAL FIBRILLATION (ICD-427.31) He went out of rhythm once back in September.  I have recommended systemic anti-coagulation with Pradaxa. Will follow. His updated medication list for this problem includes:    Carvedilol 6.25 Mg Tabs (Carvedilol) .Marland Kitchen... Take one tablet by mouth twice a day    Aspirin 81 Mg Tbec (Aspirin) .Marland Kitchen... Take one tablet by mouth daily  Patient Instructions: 1)  Your physician recommends that you schedule a follow-up appointment in: 3 months with Dr. Ladona Ridgel. 2)  Your physician has recommended you make the following change in your medication: Start pradaxa 150mg  two times a day. Prescriptions: PRADAXA 150 MG CAPS (DABIGATRAN ETEXILATE MESYLATE) 1 capsule two times a day  #60 x 5   Entered and Authorized by:   Laren Boom, MD, [facility]   Signed by:   Laren Boom, MD, [facility] on 04/29/2010   Method used:   Electronically to        [facility] [id number]* (retail)       [address] [place], [place]  [address]        Ph: [phone]       Fax: [phone]   RxID:   [id number]

## 2010-07-14 NOTE — Medication Information (Signed)
Summary: Rx request for Vacuum Therapy/Pos-T-Vac  Rx request for Vacuum Therapy/Pos-T-Vac   Imported By: Sherian Rein 04/30/2010 14:56:43  _____________________________________________________________________  External Attachment:    Type:   Image     Comment:   External Document

## 2010-07-14 NOTE — Procedures (Signed)
Summary: Upper Endoscopy  Patient: Jacob [name] Note: All result statuses are Final unless otherwise noted.  Tests: (1) Upper Endoscopy (EGD)   EGD Upper Endoscopy       DONE     [facility]     [address]     [place], [place]  [address]           ENDOSCOPY PROCEDURE REPORT     PATIENT:  Jacob [name], Jacob [name]  MR#:  [id number]     BIRTHDATE:  04-09-1932, 78 yrs. old  GENDER:  male     ENDOSCOPIST:  Judie Petit T. Russella Dar, MD, [facility]           PROCEDURE DATE:  01/16/2010     PROCEDURE:  EGD with dilatation over guidewire     ASA CLASS:  Class III     INDICATIONS:  dilation of esophageal stricture, dysphagia     MEDICATIONS:  Fentanyl 50 mcg, Versed 4 mg IV, There was residual     sedation effect present from prior procedure.     TOPICAL ANESTHETIC:  Exactacain Spray     DESCRIPTION OF PROCEDURE:   After the risks benefits and     alternatives of the procedure were thoroughly explained, informed     consent was obtained.  The [facility] [id number] endoscope was     introduced through the mouth and advanced to the second portion of     the duodenum, without limitations.  The instrument was slowly     withdrawn as the mucosa was fully examined.     <<PROCEDUREIMAGES>>     A stricture was found in the distal esophagus. It was     circumferential. It was 15 mm in diameter and located 2-3 cm above     EGJ. Savary dilation over a guidewire performed with 16, 17 and 18     mm dilators with minimal resistance to the last 2 dilators. No     heme noted. The stomach was entered and closely examined. The     pylorus, antrum, angularis, and lesser curvature were well     visualized, including a retroflexed view of the cardia and fundus.     The stomach wall was normally distensable. The scope passed easily     through the pylorus into the duodenum. The duodenal bulb was     normal in appearance, as was the postbulbar duodenum. Otherwise     the examination was normal. Retroflexed views revealed  no     abnormalities. The scope was then withdrawn from the patient and     the procedure completed.           COMPLICATIONS:  None           ENDOSCOPIC IMPRESSION:     1) 15 mm stricture in the distal esophagus           RECOMMENDATIONS:     1) Anti-reflux regimen long term     2) continue PPI long term     3) post dilation instructions     4) ongoing follow up with PCP, GI f/u prn.           Venita Lick. Russella Dar, MD, [facility]           n.     eSIGNED:   Venita Lick. Reather Steller at 01/16/2010 03:47 PM           Mendel Ryder, [id number]  Note: An exclamation mark (!) indicates a result that was not dispersed  into the flowsheet. Document Creation Date: 01/16/2010 3:49 PM _______________________________________________________________________  (1) Order result status: Final Collection or observation date-time: 01/16/2010 15:38 Requested date-time:  Receipt date-time:  Reported date-time:  Referring Physician:   Ordering Physician: Claudette Head [id number]) Specimen Source:  Source: Launa Grill Order Number: [id number] Lab site:

## 2010-07-14 NOTE — Assessment & Plan Note (Signed)
Summary: 3 mos f/u // #/ cd   Vital Signs:  Patient profile:   77 year old male Height:      70 inches (177.80 cm) Weight:      209.8 pounds (95.36 kg) O2 Sat:      96 % on Room air Temp:     97.7 degrees F (36.50 degrees C) oral Pulse rate:   87 / minute BP sitting:   112 / 52  (left arm) Cuff size:   regular  Vitals Entered By: Orlan Leavens (August 25, 2009 11:23 AM)  O2 Flow:  Room air CC: 3 month follow-up Is Patient Diabetic? No Pain Assessment Patient in pain? no        Primary Care Provider:  Newt Lukes MD  CC:  3 month follow-up.  History of Present Illness: here for 3 mo f/o  c/o arthritis both shoulders, right>left - taking naproxen 500 with some improvement in left side but not as much on right - denies any injury - long hx "bursitis" but seems pain is worse in past few months has not had PT or recent ortho eval but just had xray done about 3 weeks ago -  CHF/CAD - follows with [facility] cards for same - nishan/taylor/chf clinic reports compliance with ongoing medical treatment and no changes in medication dose or frequency. denies adverse side effects related to current therapy.  no cp, SOB or edema  HTN -  reports compliance with ongoing medical treatment and no changes in medication dose or frequency. denies adverse side effects related to current therapy.   dyslipidemia - reports compliance with ongoing medical treatment and no changes in medication dose or frequency. denies adverse side effects related to current therapy.   BPH - follows with dr. Isabel Caprice -uro recent changes in meds reviewed - feels bladder emptying better hard time getting meds approved by insurance so has resumed avodart and flomax until meds cleared - needs refills on same   Clinical Review Panels:  Immunizations   Last Tetanus Booster:  Td (04/23/2009)   Last Flu Vaccine:  Historical (03/14/2009)   Last Pneumovax:  Pneumovax (Medicare) (04/23/2009)  Lipid Management  Cholesterol:  96 (12/26/2006)   LDL (bad choesterol):  53 (12/26/2006)   HDL (good cholesterol):  21.6 (12/26/2006)  CBC   WBC:  7.4 (12/13/2008)   RBC:  4.18 (12/13/2008)   Hgb:  13.1 (12/13/2008)   Hct:  37.8 (12/13/2008)   Platelets:  153 (12/13/2008)   MCV  90.5 (12/13/2008)   MCHC  34.5 (12/13/2008)   RDW  14.3 (12/13/2008)  Complete Metabolic Panel   Glucose:  115 (03/19/2009)   Sodium:  141 (03/19/2009)   Potassium:  3.8 (03/19/2009)   Chloride:  102 (03/19/2009)   CO2:  31 (03/19/2009)   BUN:  13 (03/19/2009)   Creatinine:  1.3 (03/19/2009)   Albumin:  3.6 (12/26/2006)   Total Protein:  6.9 (12/26/2006)   Calcium:  9.0 (03/19/2009)   Total Bili:  0.9 (12/26/2006)   Alk Phos:  91 (12/26/2006)   SGPT (ALT):  15 (12/26/2006)   SGOT (AST):  17 (12/26/2006)   Current Medications (verified): 1)  Carvedilol 6.25 Mg Tabs (Carvedilol) .... Take One Tablet By Mouth Twice A Day 2)  Aspirin 81 Mg Tbec (Aspirin) .... Take One Tablet By Mouth Daily 3)  Hyzaar 50-12.5 Mg Tabs (Losartan Potassium-Hctz) .Marland Kitchen.. 1 Tab Daily 4)  Omeprazole 20 Mg Cpdr (Omeprazole) .Marland Kitchen.. 1 Tab By Mouth Once Daily 5)  Imdur 30  Mg Xr24h-Tab (Isosorbide Mononitrate) .... Once Daily 6)  Simvastatin 40 Mg Tabs (Simvastatin) .Marland Kitchen.. 1 Tab Qhs 7)  Nasal Spray .... As Needed 8)  Hydrocodone-Acetaminophen 5-325 Mg Tabs (Hydrocodone-Acetaminophen) .... As Needed 9)  Fish Oil   Oil (Fish Oil) .Marland Kitchen.. 1 Tab By Mouth Once Daily 10)  Vitamin B-12  (Cyanocobalamin) .Marland Kitchen.. 1 Tab By Mouth Once Daily 11)  Furosemide 20 Mg Tabs (Furosemide) .... Take 1/2 By Mouth Qd 12)  Vesicare 5 Mg Tabs (Solifenacin Succinate) .... Take 1 Po Qd 13)  Jalyn 0.5-0.4 Mg Caps (Dutasteride-Tamsulosin Hcl) .... Once Daily 14)  Naproxen 500 Mg Tabs (Naproxen) .... Take 1 By Mouth Qd 15)  Fluticasone Propionate 50 Mcg/act Susp (Fluticasone Propionate) .Marland Kitchen.. 1 Spray Each Nostril Once Daily Prn  Allergies (verified): 1)  ! Penicillin  Past  History:  Past Medical History: CAD CHF: systolic dysfunction with old IMI Hypertension Hyperlipidemia BPH Barrett's Esophagus Nephrolithiasis, hx of Colonic Polyps Diverticulitis, hx of GERD Urinary incontinence  MD rooster - cards -taylor uro -grapey GI -orr  Review of Systems  The patient denies fever, weight loss, syncope, headaches, abdominal pain, and difficulty walking.    Physical Exam  General:  alert, well-developed, well-nourished, and cooperative to examination.    Lungs:  normal respiratory effort, no intercostal retractions or use of accessory muscles; normal breath sounds bilaterally - no crackles and no wheezes.    Heart:  normal rate, regular rhythm, no murmur, and no rub. BLE without edema.  Msk:  limited abduction of right shoulder due to pain but also reduced PROM Psych:  Oriented X3, memory intact for recent and remote, normally interactive, good eye contact, not anxious appearing, not depressed appearing, and not agitated.      Impression & Recommendations:  Problem # 1:  ARTHRITIS (ICD-716.90)  new exac of right shoulder pain - ?frozen shoulder from chroinc bursitis - will refer to ortho for eval and tx as well as PT also pain affects both knees, reports "end stage" but ?not candidate for TKR b/c cardiac issues? - consider injections as needed   Orders: Orthopedic Referral (Ortho) Physical Therapy Referral (PT)  Problem # 2:  HYPERTENSION (ICD-401.9)  His updated medication list for this problem includes:    Carvedilol 6.25 Mg Tabs (Carvedilol) .Marland Kitchen... Take one tablet by mouth twice a day    Hyzaar 50-12.5 Mg Tabs (Losartan potassium-hctz) .Marland Kitchen... 1 tab daily    Furosemide 20 Mg Tabs (Furosemide) .Marland Kitchen... Take 1/2 by mouth qd  BP today: 112/52 Prior BP: 114/68 (04/25/2009)  Labs Reviewed: K+: 3.8 (03/19/2009) Creat: : 1.3 (03/19/2009)   Chol: 96 (12/26/2006)   HDL: 21.6 (12/26/2006)   LDL: 53 (12/26/2006)   TG: 109 (12/26/2006)  Problem # 3:   HYPERCHOLESTEROLEMIA, MIXED (ICD-272.0)  His updated medication list for this problem includes:    Simvastatin 40 Mg Tabs (Simvastatin) .Marland Kitchen... 1 tab qhs  Labs Reviewed: SGOT: 17 (12/26/2006)   SGPT: 15 (12/26/2006)   HDL:21.6 (12/26/2006)  LDL:53 (12/26/2006)  Chol:96 (12/26/2006)  Trig:109 (12/26/2006)  Problem # 4:  BENIGN PROSTATIC HYPERTROPHY, WITH OBSTRUCTION (ICD-600.01)  not regularly taking jalyn due t oinsurance issues -  resume flomax and avodart until those issues settled with uro - pt understands not to take both The following medications were removed from the medication list:    Flomax 0.4 Mg Xr24h-cap (Tamsulosin hcl) .Marland Kitchen... 1 tab daily His updated medication list for this problem includes:    Jalyn 0.5-0.4 Mg Caps (Dutasteride-tamsulosin hcl) ..... Once daily  Flomax 0.4 Mg Caps (Tamsulosin hcl) .Marland Kitchen... 1 by mouth once daily    Avodart 0.5 Mg Caps (Dutasteride) .Marland Kitchen... 1 by mouth once daily  Orders: Prescription Created Electronically [phone])  Complete Medication List: 1)  Carvedilol 6.25 Mg Tabs (Carvedilol) .... Take one tablet by mouth twice a day 2)  Aspirin 81 Mg Tbec (Aspirin) .... Take one tablet by mouth daily 3)  Hyzaar 50-12.5 Mg Tabs (Losartan potassium-hctz) .Marland Kitchen.. 1 tab daily 4)  Omeprazole 20 Mg Cpdr (Omeprazole) .Marland Kitchen.. 1 tab by mouth once daily 5)  Imdur 30 Mg Xr24h-tab (Isosorbide mononitrate) .... Once daily 6)  Simvastatin 40 Mg Tabs (Simvastatin) .Marland Kitchen.. 1 tab qhs 7)  Nasal Spray  .... As needed 8)  Hydrocodone-acetaminophen 5-325 Mg Tabs (Hydrocodone-acetaminophen) .... As needed 9)  Fish Oil Oil (Fish oil) .Marland Kitchen.. 1 tab by mouth once daily 10)  Vitamin B-12 (cyanocobalamin)  .Marland Kitchen.. 1 tab by mouth once daily 11)  Furosemide 20 Mg Tabs (Furosemide) .... Take 1/2 by mouth qd 12)  Vesicare 5 Mg Tabs (Solifenacin succinate) .... Take 1 po qd 13)  Jalyn 0.5-0.4 Mg Caps (Dutasteride-tamsulosin hcl) .... Once daily 14)  Naproxen 500 Mg Tabs (Naproxen) .... Take 1 by  mouth qd 15)  Fluticasone Propionate 50 Mcg/act Susp (Fluticasone propionate) .Marland Kitchen.. 1 spray each nostril once daily prn 16)  Flomax 0.4 Mg Caps (Tamsulosin hcl) .Marland Kitchen.. 1 by mouth once daily 17)  Avodart 0.5 Mg Caps (Dutasteride) .Marland Kitchen.. 1 by mouth once daily  Patient Instructions: 1)  it was good to see you today. 2)  we'll make referral to PT and orthopedics for your shoulder pain. Our office will contact you regarding this appointment once made. 3)  resume flomax and Avodart - your prescriptions have been electronically submitted to your pharmacy. Please take as directed. Contact our office if you believe you're having problems with the medication(s). 4)  Please schedule a follow-up appointment in 3-4 months, sooner if problems.   Prescriptions: AVODART 0.5 MG CAPS (DUTASTERIDE) 1 by mouth once daily  #30 x 11   Entered and Authorized by:   Newt Lukes MD   Signed by:   Newt Lukes MD on 08/25/2009   Method used:   Electronically to        [facility] [address] [id number]* (retail)       [address] [place], [place]  [address]       Ph: [phone]       Fax: [phone]   RxID:   [id number] FLOMAX 0.4 MG CAPS (TAMSULOSIN HCL) 1 by mouth once daily  #30 x 11   Entered and Authorized by:   Newt Lukes MD   Signed by:   Newt Lukes MD on 08/25/2009   Method used:   Electronically to        [facility] [address] [facility] [address] #[id number]* (retail)       [address] [place], [place]  [address]       Ph: [phone]       Fax: [phone]   RxID:   [id number]

## 2010-07-14 NOTE — Letter (Signed)
Summary: [facility]  [facility]   Imported By: Sherian Rein 03/13/2010 07:30:34  _____________________________________________________________________  External Attachment:    Type:   Image     Comment:   External Document

## 2010-07-14 NOTE — Progress Notes (Signed)
Summary: sooner appt  Phone Note Call from Patient Call back at Home Phone [phone]   Caller: Jacob [name] Call For: Jacob [name] Reason for Call: Talk to Nurse Summary of Call: Dr Jacob [name] saw this patient in the ER last night (7-13) and told them that he wanted to see him in three days but his first available is on 8-31. Initial call taken by: Tawni Levy,  December 25, 2009 9:35 AM  Follow-up for Phone Call        per Dr Jacob [name] patient needs ESA in [facility] 2-3 weeks for s/p food impactions.  patient is scheduled for 01/16/10 3:00.  Pre-visit scheduled for 01/09/10.  I have scheduled both appointments with the patient's wife. Follow-up by: Darcey Nora RN, CGRN,  December 25, 2009 9:58 AM

## 2010-07-14 NOTE — Progress Notes (Signed)
  Phone Note Outgoing Call   Call placed by: Scherrie Bateman, LPN,  March 18, 2010 3:19 PM Call placed to: DR Ranell Patrick    [phone] Summary of Call:  AWAITING CALL FROM DR NORRIS  OFFICE  FOR PROCEDURE  DATE .PT TO HAVE LEFT ROTATOR CUFF REPAIR SAME SIDE AS DEVICE  PER DR NISHAN CLEARED FOR PROCEDURE NEED TO LET JOEY KNOW FROM [employer] DATE TIME AND PLACE OF PROCEDURE MAY NEED TO SHUT OFF DEVICE JOEY'S NUMBER [phone] Initial call taken by: Scherrie Bateman, LPN,  March 18, 2010 3:30 PM  Follow-up for Phone Call        BRAD TO [facility]  WITH INFO NEEDED Scherrie Bateman, LPN  March 19, 2010 2:01 PM  SPOKE WITH WIFE SHOULDER  GETTING BETTER PER LAST OFFICE NOTE FROM DR Ranell Patrick PT'S SHOULDER DOING BETTER WILL NOT PROCEED WITH SURGERY AT THIS TIME  Follow-up by: Scherrie Bateman, LPN,  April 13, 2010 2:16 PM

## 2010-07-14 NOTE — Cardiovascular Report (Signed)
Summary: Office Visit Remote   Office Visit Remote   Imported By: Roderic Ovens 02/24/2010 09:59:36  _____________________________________________________________________  External Attachment:    Type:   Image     Comment:   External Document

## 2010-07-14 NOTE — Procedures (Signed)
Summary: Colonoscopy/Dr Virginia Rochester    Colonoscopy  Procedure date:  11/23/2004  Findings:      Location:  [facility].    NAME:  Jacob [name], Jacob [name]                ACCOUNT NO.:  [id number]   MEDICAL RECORD NO.:  [id number]          PATIENT TYPE:  AMB   LOCATION:  ENDO                         FACILITY[facility]  [facility]   PHYSICIAN:  Georgiana Spinner, M.D.    DATE OF BIRTH:  [birth year]   DATE OF PROCEDURE:  11/23/2004  DATE OF DISCHARGE:                                 OPERATIVE REPORT   PROCEDURE:  Colonoscopy.   INDICATIONS:  Colon polyps.   ANESTHESIA:  Demerol 80 mg, Versed 7 mg.   DESCRIPTION OF PROCEDURE:  With the patient mildly sedated in the left  lateral decubitus position, a rectal exam was performed which was  unremarkable. Subsequently the Olympus videoscopic colonoscope was inserted  in the rectum and passed under direct vision to the cecum identified by the  ileocecal valve and appendiceal orifice both of which were photographed.  From this point, the colonoscope was slowly withdrawn taking circumferential  views of the colonic mucosa stopping only in the rectum which appeared  normal on direct and retroflexed view. The endoscope was straightened and  withdrawn. The patient's vital signs and pulse oximeter remained stable. The  patient tolerated the procedure well without apparent complications.   FINDINGS:  Diverticulosis of the sigmoid colon, photographed, otherwise an  unremarkable examination.   PLAN:  Have the patient follow-up with me in five years or as needed.       GMO/MEDQ  D:  11/23/2004  T:  11/23/2004  Job:  [id number]

## 2010-07-14 NOTE — Letter (Signed)
Summary: [facility]  [facility]   Imported By: Lester Crowder 09/25/2009 11:26:44  _____________________________________________________________________  External Attachment:    Type:   Image     Comment:   External Document

## 2010-07-14 NOTE — Cardiovascular Report (Signed)
Summary: Office Visit Remote   Office Visit Remote   Imported By: Roderic Ovens 11/05/2009 16:49:20  _____________________________________________________________________  External Attachment:    Type:   Image     Comment:   External Document

## 2010-07-14 NOTE — Progress Notes (Signed)
Summary: will you be sending medical records  Phone Note From Other Clinic Call back at [phone] ext [phone]   Caller: Minerva Areola from [facility] max Request: Talk with Nurse, Talk with Provider Summary of Call: They have questions regarding the order you signed. He was wondering if you where gonna send medical records Initial call taken by: Omer Jack,  September 09, 2009 4:33 PM  Follow-up for Phone Call        dr taylor and dr Fabio Bering last office note faxed Deliah Goody, RN  September 09, 2009 5:16 PM]

## 2010-07-14 NOTE — Letter (Signed)
Summary: [facility] Office Visit Note   [facility] Office Visit Note   Imported By: Roderic Ovens 04/01/2010 14:16:51  _____________________________________________________________________  External Attachment:    Type:   Image     Comment:   External Document

## 2010-07-16 NOTE — Letter (Signed)
Summary: [facility]  [facility]   Imported By: Lennie Odor 05/29/2010 11:24:36  _____________________________________________________________________  External Attachment:    Type:   Image     Comment:   External Document

## 2010-07-16 NOTE — Assessment & Plan Note (Signed)
Summary: 3-4 MO ROV /NWS #   Vital Signs:  Patient profile:   77 year old male Height:      70 inches (177.80 cm) Weight:      198.6 pounds (90.27 kg) O2 Sat:      95 % on Room air Temp:     97.5 degrees F (36.39 degrees C) oral Pulse rate:   79 / minute BP sitting:   100 / 52  (left arm) Cuff size:   regular  Vitals Entered By: Orlan Leavens RMA (May 27, 2010 1:31 PM)  O2 Flow:  Room air CC: 3 month follow-up Is Patient Diabetic? No Pain Assessment Patient in pain? no        Primary Care Provider:  Newt Lukes MD  CC:  3 month follow-up.  History of Present Illness: here for 3 mo f/u -   c/o arthritis both shoulders, right>left, knees and hips s/p fall accidental on left shoulder taking naproxen 500 with some improvement - saw ortho for same - had steroid injection with temp relief   CHF/CAD - follows with [facility] cards for same - nishan/taylor/chf clinic reports compliance with ongoing medical treatment and no changes in medication dose or frequency. denies adverse side effects related to current therapy. no cp, SOB or edema -   HTN -  reports compliance with ongoing medical treatment and no changes in medication dose or frequency. denies adverse side effects related to current therapy.  no HA or vision changes  dyslipidemia - reports compliance with ongoing medical treatment and no changes in medication dose or frequency. denies adverse side effects related to current therapy. no GI or muscle c/o  BPH - follows with dr. Isabel Caprice -uro recent changes in meds reviewed - feels bladder emptying better hard time getting meds approved by insurance so has resumed avodart and flomax until meds cleared - needs refills on same   Clinical Review Panels:  Immunizations   Last Tetanus Booster:  Td (04/23/2009)   Last Flu Vaccine:  Fluvax 3+ (02/23/2010)   Last Pneumovax:  Pneumovax (Medicare) (04/23/2009)  Lipid Management   Cholesterol:  146 (11/24/2009)  LDL (bad choesterol):  53 (12/26/2006)   HDL (good cholesterol):  30.30 (11/24/2009)  CBC   WBC:  7.4 (12/13/2008)   RBC:  4.18 (12/13/2008)   Hgb:  13.1 (12/13/2008)   Hct:  37.8 (12/13/2008)   Platelets:  153 (12/13/2008)   MCV  90.5 (12/13/2008)   MCHC  34.5 (12/13/2008)   RDW  14.3 (12/13/2008)  Complete Metabolic Panel   Glucose:  115 (03/19/2009)   Sodium:  141 (03/19/2009)   Potassium:  3.8 (03/19/2009)   Chloride:  102 (03/19/2009)   CO2:  31 (03/19/2009)   BUN:  13 (03/19/2009)   Creatinine:  1.3 (03/19/2009)   Albumin:  4.1 (11/24/2009)   Total Protein:  7.1 (11/24/2009)   Calcium:  9.0 (03/19/2009)   Total Bili:  1.0 (11/24/2009)   Alk Phos:  77 (11/24/2009)   SGPT (ALT):  15 (11/24/2009)   SGOT (AST):  19 (11/24/2009)   Current Medications (verified): 1)  Carvedilol 6.25 Mg Tabs (Carvedilol) .... Take One Tablet By Mouth Twice A Day 2)  Aspirin 81 Mg Tbec (Aspirin) .... Take One Tablet By Mouth Daily 3)  Hyzaar 50-12.5 Mg Tabs (Losartan Potassium-Hctz) .Marland Kitchen.. 1 Tab Daily 4)  Omeprazole 20 Mg Cpdr (Omeprazole) .Marland Kitchen.. 1 Tab By Mouth Once Daily 5)  Imdur 30 Mg Xr24h-Tab (Isosorbide Mononitrate) .... Once Daily 6)  Simvastatin  40 Mg Tabs (Simvastatin) .Marland Kitchen.. 1 Tab Qhs 7)  Hydrocodone-Acetaminophen 5-325 Mg Tabs (Hydrocodone-Acetaminophen) .... As Needed 8)  Vitamin B-12  (Cyanocobalamin) .Marland Kitchen.. 1 Tab By Mouth Once Daily 9)  Furosemide 20 Mg Tabs (Furosemide) .... Take 1/2 By Mouth Qd 10)  Vesicare 5 Mg Tabs (Solifenacin Succinate) .... Take 1 Po Qd 11)  Fluticasone Propionate 50 Mcg/act Susp (Fluticasone Propionate) .Marland Kitchen.. 1 Spray Each Nostril Once Daily Prn 12)  Flomax 0.4 Mg Caps (Tamsulosin Hcl) .Marland Kitchen.. 1 By Mouth Once Daily 13)  Avodart 0.5 Mg Caps (Dutasteride) .Marland Kitchen.. 1 By Mouth Once Daily 14)  Pradaxa 150 Mg Caps (Dabigatran Etexilate Mesylate) .Marland Kitchen.. 1 Capsule Two Times A Day  Allergies (verified): 1)  ! Penicillin  Past History:  Past Medical History: CAD CHF:  systolic dysfunction with old IMI Hypertension Hyperlipidemia BPH  Barrett's Esophagus Nephrolithiasis, hx of Colonic Polyps Diverticulitis, hx of GERD   MD roster: cards - taylor uro -grapey GI -prev orr, now stark pain - ramos   Review of Systems  The patient denies weight loss, chest pain, peripheral edema, headaches, and abdominal pain.    Physical Exam  General:  alert, well-developed, well-nourished, and cooperative to examination.   wife at side Lungs:  normal respiratory effort, no intercostal retractions or use of accessory muscles; normal breath sounds bilaterally - no crackles and no wheezes.    Heart:  normal rate, regular rhythm, no murmur, and no rub. BLE without edema.  Skin:  dry skin with excema changes across back and inner thighs   Impression & Recommendations:  Problem # 1:  ECZEMA (ICD-692.9)  His updated medication list for this problem includes:    Triamcinolone Acetonide 0.1 % Lotn (Triamcinolone acetonide) .Marland Kitchen... Apply to affected skin two times a day for itch symptoms  Discussed avoidance of triggers and symptomatic treatment.   Orders: Prescription Created Electronically [phone])  Problem # 2:  HYPERCHOLESTEROLEMIA, MIXED (ICD-272.0)  His updated medication list for this problem includes:    Simvastatin 40 Mg Tabs (Simvastatin) .Marland Kitchen... 1 tab qhs  Labs Reviewed: SGOT: 19 (11/24/2009)   SGPT: 15 (11/24/2009)   HDL:30.30 (11/24/2009), 21.6 (12/26/2006)  LDL:53 (12/26/2006)  Chol:146 (11/24/2009), 96 (12/26/2006)  Trig:278.0 (11/24/2009), 109 (12/26/2006)  Problem # 3:  HYPERTENSION (ICD-401.9)  His updated medication list for this problem includes:    Carvedilol 6.25 Mg Tabs (Carvedilol) .Marland Kitchen... Take one tablet by mouth twice a day    Hyzaar 50-12.5 Mg Tabs (Losartan potassium-hctz) .Marland Kitchen... 1 tab daily    Furosemide 20 Mg Tabs (Furosemide) .Marland Kitchen... Take 1/2 by mouth qd  BP today: 100/52 Prior BP: 92/50 (04/28/2010)  Labs Reviewed: K+: 3.8  (03/19/2009) Creat: : 1.3 (03/19/2009)   Chol: 146 (11/24/2009)   HDL: 30.30 (11/24/2009)   LDL: 53 (12/26/2006)   TG: 278.0 (11/24/2009)  Problem # 4:  ATRIAL FIBRILLATION (ICD-427.31)  His updated medication list for this problem includes:    Carvedilol 6.25 Mg Tabs (Carvedilol) .Marland Kitchen... Take one tablet by mouth twice a day    Aspirin 81 Mg Tbec (Aspirin) .Marland Kitchen... Take one tablet by mouth daily  He went out of rhythm in 02/2010. on systemic anti-coagulation with Pradaxa. Will follow.  Reviewed the following: PT: 14.4 (12/13/2008)   INR: 1.1 (12/13/2008)  Complete Medication List: 1)  Carvedilol 6.25 Mg Tabs (Carvedilol) .... Take one tablet by mouth twice a day 2)  Aspirin 81 Mg Tbec (Aspirin) .... Take one tablet by mouth daily 3)  Hyzaar 50-12.5 Mg Tabs (Losartan potassium-hctz) .Marland Kitchen.. 1 tab  daily 4)  Omeprazole 20 Mg Cpdr (Omeprazole) .Marland Kitchen.. 1 tab by mouth once daily 5)  Imdur 30 Mg Xr24h-tab (Isosorbide mononitrate) .... Once daily 6)  Simvastatin 40 Mg Tabs (Simvastatin) .Marland Kitchen.. 1 tab qhs 7)  Hydrocodone-acetaminophen 5-325 Mg Tabs (Hydrocodone-acetaminophen) .... As needed 8)  Vitamin B-12 (cyanocobalamin)  .Marland Kitchen.. 1 tab by mouth once daily 9)  Furosemide 20 Mg Tabs (Furosemide) .... Take 1/2 by mouth qd 10)  Vesicare 5 Mg Tabs (Solifenacin succinate) .... Take 1 po qd 11)  Fluticasone Propionate 50 Mcg/act Susp (Fluticasone propionate) .Marland Kitchen.. 1 spray each nostril once daily prn 12)  Flomax 0.4 Mg Caps (Tamsulosin hcl) .Marland Kitchen.. 1 by mouth once daily 13)  Avodart 0.5 Mg Caps (Dutasteride) .Marland Kitchen.. 1 by mouth once daily 14)  Pradaxa 150 Mg Caps (Dabigatran etexilate mesylate) .Marland Kitchen.. 1 capsule two times a day 15)  Triamcinolone Acetonide 0.1 % Lotn (Triamcinolone acetonide) .... Apply to affected skin two times a day for itch symptoms  Patient Instructions: 1)  it was good to see you today. 2)  triamcinolone lotion and moisturizer lotion for skin as discussed - 3)  your prescriptions have been  electronically submitted to your pharmacy. Please take as directed. Contact our office if you believe you're having problems with the medication(s).  4)  Please schedule a follow-up appointment in 3 months, sooner if problems. will check cholesterol and labs next office visit Prescriptions: TRIAMCINOLONE ACETONIDE 0.1 % LOTN (TRIAMCINOLONE ACETONIDE) apply to affected skin two times a day for itch symptoms  #1 x 1   Entered and Authorized by:   Newt Lukes MD   Signed by:   Newt Lukes MD on 05/27/2010   Method used:   Electronically to        [facility] [address] [id number]* (retail)       [address] [place], [place]  [address]       Ph: [phone]       Fax: [phone]   RxID:   [id number]    Orders Added: 1)  Est. Patient Level IV [44010] 2)  Prescription Created Electronically [phone]

## 2010-07-16 NOTE — Letter (Signed)
Summary: [facility] Office Visit Note   [facility] Office Visit Note   Imported By: Roderic Ovens 06/09/2010 16:09:15  _____________________________________________________________________  External Attachment:    Type:   Image     Comment:   External Document

## 2010-08-06 ENCOUNTER — Encounter: Payer: Self-pay | Admitting: Internal Medicine

## 2010-08-06 ENCOUNTER — Encounter (INDEPENDENT_AMBULATORY_CARE_PROVIDER_SITE_OTHER): Payer: Medicare Other | Admitting: Internal Medicine

## 2010-08-06 DIAGNOSIS — I5022 Chronic systolic (congestive) heart failure: Secondary | ICD-10-CM

## 2010-08-06 DIAGNOSIS — I2589 Other forms of chronic ischemic heart disease: Secondary | ICD-10-CM

## 2010-08-11 NOTE — Assessment & Plan Note (Signed)
Summary: 3 month/d miller defib ck   Primary Provider:  Newt Lukes MD   History of Present Illness: Jacob [name] returns today for followup.  He is a pleasant 77 yo man with a h/o an ICM, EF 25%, class 1 CHF, HTN and dyslipidemia.  He denies c/p or palpitations but has noted some sob.  He does have problems with arthritis in his knees.  Despite this he remains active working in his garden, growing tomatoes. He has had no ICD shocks. He denies syncope.  Current Medications (verified): 1)  Carvedilol 6.25 Mg Tabs (Carvedilol) .... Take One Tablet By Mouth Twice A Day 2)  Aspirin 81 Mg Tbec (Aspirin) .... Take One Tablet By Mouth Daily 3)  Hyzaar 50-12.5 Mg Tabs (Losartan Potassium-Hctz) .Marland Kitchen.. 1 Tab Daily 4)  Omeprazole 20 Mg Cpdr (Omeprazole) .Marland Kitchen.. 1 Tab By Mouth Once Daily 5)  Imdur 30 Mg Xr24h-Tab (Isosorbide Mononitrate) .... Once Daily 6)  Simvastatin 40 Mg Tabs (Simvastatin) .Marland Kitchen.. 1 Tab Qhs 7)  Hydrocodone-Acetaminophen 5-325 Mg Tabs (Hydrocodone-Acetaminophen) .... As Needed 8)  Vitamin B-12  (Cyanocobalamin) .Marland Kitchen.. 1 Tab By Mouth Once Daily 9)  Furosemide 20 Mg Tabs (Furosemide) .... Take 1/2 By Mouth Qd 10)  Vesicare 5 Mg Tabs (Solifenacin Succinate) .... Take 1 Po Qd 11)  Fluticasone Propionate 50 Mcg/act Susp (Fluticasone Propionate) .Marland Kitchen.. 1 Spray Each Nostril Once Daily Prn 12)  Flomax 0.4 Mg Caps (Tamsulosin Hcl) .Marland Kitchen.. 1 By Mouth Once Daily 13)  Avodart 0.5 Mg Caps (Dutasteride) .Marland Kitchen.. 1 By Mouth Once Daily 14)  Pradaxa 150 Mg Caps (Dabigatran Etexilate Mesylate) .Marland Kitchen.. 1 Capsule Two Times A Day  Allergies: 1)  ! Penicillin  Past History:  Past Medical History: Last updated: 05/27/2010 CAD CHF: systolic dysfunction with old IMI Hypertension Hyperlipidemia BPH  Barrett's Esophagus Nephrolithiasis, hx of Colonic Polyps Diverticulitis, hx of GERD   MD roster: cards - taylor uro -grapey GI -prev orr, now stark pain - ramos   Past Surgical History: Last updated:  04/22/2008 AICD:  Ladona Ridgel 12/2006  Review of Systems  The patient denies chest pain, syncope, dyspnea on exertion, and peripheral edema.    Vital Signs:  Patient profile:   77 year old male Height:      70 inches Weight:      205 pounds BMI:     29.52 Pulse rate:   70 / minute BP sitting:   110 / 50  (right arm)  Vitals Entered By: Laurance Flatten CMA (August 06, 2010 2:03 PM)  Physical Exam  General:  alert, well-developed, well-nourished, and cooperative to examination.   wife at side Head:  normocephalic and atraumatic Eyes:  vision grossly intact; pupils equal, round and reactive to light.  conjunctiva and lids normal.    Ears:  HOH - chronic Neck:  Neck supple, no JVD. No masses, thyromegaly or abnormal cervical nodes. Chest Wall:  Well healed ICD incision. Lungs:  normal respiratory effort, no intercostal retractions or use of accessory muscles; normal breath sounds bilaterally - no crackles and no wheezes.    Heart:  normal rate, regular rhythm, no murmur, and no rub. BLE without edema.  Abdomen:  Bowel sounds positive; abdomen soft and non-tender without masses, organomegaly, or hernias noted. No hepatosplenomegaly. Msk:  limited abduction of right shoulder due to pain but also reduced PROM Pulses:  pulses normal in all 4 extremities Extremities:  No clubbing or cyanosis. No edema. Neurologic:  Alert and oriented x 3.    ICD Specifications Following  MD:  Lewayne Bunting, MD     Referring MD:  [facility] ICD Vendor:  [employer]     ICD Model Number:  [id number]     ICD Serial Number:  [id number] ICD DOI:  12/28/2006     ICD Implanting MD:  Lewayne Bunting, MD  Lead 1:    Location: RA     DOI: 12/28/2006     Model #: [id number]     Serial #: [id number]     Status: active Lead 2:    Location: RV     DOI: 12/28/2006     Model #: 2956     Serial #: [id number]     Status: active  Indications::  ICM   ICD Follow Up ICD Dependent:  No      Episodes Coumadin:  No  Brady Parameters Mode DDD      Lower Rate Limit:  60     Upper Rate Limit 110 PAV 400     Sensed AV Delay:  290  Tachy Zones VF:  210     VT:  180     MD Comments:  Normal device function.  Impression & Recommendations:  Problem # 1:  AUTOMATIC IMPLANTABLE CARDIAC DEFIBRILLATOR SITU (ICD-V45.02) His device is working normally. Will recheck in several months.  Problem # 2:  ATRIAL FIBRILLATION (ICD-427.31) He has had minimal episodes since his last visit, though longest lasting only a few seconds. His updated medication list for this problem includes:    Carvedilol 6.25 Mg Tabs (Carvedilol) .Marland Kitchen... Take one tablet by mouth twice a day    Aspirin 81 Mg Tbec (Aspirin) .Marland Kitchen... Take one tablet by mouth daily  Problem # 3:  CORONARY ATHEROSCLEROSIS, NATIVE VESSEL (ICD-414.01) He denies anginal symptoms. Continue meds as below. His updated medication list for this problem includes:    Carvedilol 6.25 Mg Tabs (Carvedilol) .Marland Kitchen... Take one tablet by mouth twice a day    Aspirin 81 Mg Tbec (Aspirin) .Marland Kitchen... Take one tablet by mouth daily    Imdur 30 Mg Xr24h-tab (Isosorbide mononitrate) ..... Once daily  Patient Instructions: 1)  Your physician recommends that you continue on your current medications as directed. Please refer to the Current Medication list given to you today. 2)  Your physician wants you to follow-up in: 1 year.  You will receive a reminder letter in the mail two months in advance. If you don't receive a letter, please call our office to schedule the follow-up appointment.

## 2010-08-25 NOTE — Cardiovascular Report (Signed)
Summary: Office Visit   Office Visit   Imported By: Roderic Ovens 08/19/2010 12:22:22  _____________________________________________________________________  External Attachment:    Type:   Image     Comment:   External Document

## 2010-08-26 ENCOUNTER — Other Ambulatory Visit: Payer: Self-pay | Admitting: Internal Medicine

## 2010-08-26 ENCOUNTER — Ambulatory Visit (INDEPENDENT_AMBULATORY_CARE_PROVIDER_SITE_OTHER): Payer: Medicare Other | Admitting: Internal Medicine

## 2010-08-26 ENCOUNTER — Other Ambulatory Visit: Payer: Medicare Other

## 2010-08-26 ENCOUNTER — Encounter: Payer: Self-pay | Admitting: Internal Medicine

## 2010-08-26 DIAGNOSIS — E7800 Pure hypercholesterolemia, unspecified: Secondary | ICD-10-CM

## 2010-08-26 DIAGNOSIS — D239 Other benign neoplasm of skin, unspecified: Secondary | ICD-10-CM | POA: Insufficient documentation

## 2010-08-26 DIAGNOSIS — E785 Hyperlipidemia, unspecified: Secondary | ICD-10-CM

## 2010-08-26 DIAGNOSIS — Z79899 Other long term (current) drug therapy: Secondary | ICD-10-CM

## 2010-08-26 DIAGNOSIS — I10 Essential (primary) hypertension: Secondary | ICD-10-CM

## 2010-08-26 LAB — HEPATIC FUNCTION PANEL
Albumin: 4 g/dL (ref 3.5–5.2)
Bilirubin, Direct: 0.2 mg/dL (ref 0.0–0.3)
Total Protein: 6.8 g/dL (ref 6.0–8.3)

## 2010-08-26 LAB — LIPID PANEL
Cholesterol: 131 mg/dL (ref 0–200)
Triglycerides: 219 mg/dL — ABNORMAL HIGH (ref 0.0–149.0)

## 2010-08-26 LAB — LDL CHOLESTEROL, DIRECT: Direct LDL: 54.3 mg/dL

## 2010-09-01 NOTE — Assessment & Plan Note (Signed)
Summary: 3 MO ROV//NWS #   Vital Signs:  Patient profile:   77 year old male Weight:      209.6 pounds (95.27 kg) O2 Sat:      97 % on Room air Temp:     97.6 degrees F (36.44 degrees C) oral Pulse rate:   70 / minute BP sitting:   102 / 68  (left arm) Cuff size:   regular  Vitals Entered By: Orlan Leavens RMA (August 26, 2010 1:38 PM)  O2 Flow:  Room air CC: 3 MONTH FOLLOW-UP Is Patient Diabetic? No Pain Assessment Patient in pain? no        Primary Care Provider:  Newt Lukes MD  CC:  3 MONTH FOLLOW-UP.  History of Present Illness: here for 3 mo f/u -   c/o arthritis both shoulders, right>left, knees and hips s/p fall accidental on left shoulder taking naproxen 500 with some improvement - saw ortho for same - had steroid injection with temp relief   CHF/CAD - follows with [facility] cards for same - nishan/taylor/chf clinic reports compliance with ongoing medical treatment and no changes in medication dose or frequency. denies adverse side effects related to current therapy. no cp, SOB or edema -   HTN -  reports compliance with ongoing medical treatment and no changes in medication dose or frequency. denies adverse side effects related to current therapy.  no HA or vision changes  dyslipidemia - reports compliance with ongoing medical treatment and no changes in medication dose or frequency. denies adverse side effects related to current therapy. no GI or muscle c/o  BPH - follows with dr. Isabel Caprice -uro recent changes in meds reviewed - feels bladder emptying better hard time getting meds approved by insurance so has resumed avodart and flomax until meds cleared -   Clinical Review Panels:  Lipid Management   Cholesterol:  146 (11/24/2009)   LDL (bad choesterol):  53 (12/26/2006)   HDL (good cholesterol):  30.30 (11/24/2009)  CBC   WBC:  7.4 (12/13/2008)   RBC:  4.18 (12/13/2008)   Hgb:  13.1 (12/13/2008)   Hct:  37.8 (12/13/2008)   Platelets:  153  (12/13/2008)   MCV  90.5 (12/13/2008)   MCHC  34.5 (12/13/2008)   RDW  14.3 (12/13/2008)  Complete Metabolic Panel   Glucose:  115 (03/19/2009)   Sodium:  141 (03/19/2009)   Potassium:  3.8 (03/19/2009)   Chloride:  102 (03/19/2009)   CO2:  31 (03/19/2009)   BUN:  13 (03/19/2009)   Creatinine:  1.3 (03/19/2009)   Albumin:  4.1 (11/24/2009)   Total Protein:  7.1 (11/24/2009)   Calcium:  9.0 (03/19/2009)   Total Bili:  1.0 (11/24/2009)   Alk Phos:  77 (11/24/2009)   SGPT (ALT):  15 (11/24/2009)   SGOT (AST):  19 (11/24/2009)   Current Medications (verified): 1)  Carvedilol 6.25 Mg Tabs (Carvedilol) .... Take One Tablet By Mouth Twice A Day 2)  Aspirin 81 Mg Tbec (Aspirin) .... Take One Tablet By Mouth Daily 3)  Hyzaar 50-12.5 Mg Tabs (Losartan Potassium-Hctz) .Marland Kitchen.. 1 Tab Daily 4)  Omeprazole 20 Mg Cpdr (Omeprazole) .Marland Kitchen.. 1 Tab By Mouth Once Daily 5)  Imdur 30 Mg Xr24h-Tab (Isosorbide Mononitrate) .... Once Daily 6)  Simvastatin 40 Mg Tabs (Simvastatin) .Marland Kitchen.. 1 Tab Qhs 7)  Hydrocodone-Acetaminophen 5-325 Mg Tabs (Hydrocodone-Acetaminophen) .... As Needed 8)  Vitamin B-12  (Cyanocobalamin) .Marland Kitchen.. 1 Tab By Mouth Once Daily 9)  Furosemide 20 Mg Tabs (Furosemide) .... Take  1/2 By Mouth Qd 10)  Vesicare 5 Mg Tabs (Solifenacin Succinate) .... Take 1 Po Qd 11)  Fluticasone Propionate 50 Mcg/act Susp (Fluticasone Propionate) .Marland Kitchen.. 1 Spray Each Nostril Once Daily Prn 12)  Flomax 0.4 Mg Caps (Tamsulosin Hcl) .Marland Kitchen.. 1 By Mouth Once Daily 13)  Avodart 0.5 Mg Caps (Dutasteride) .Marland Kitchen.. 1 By Mouth Once Daily 14)  Pradaxa 150 Mg Caps (Dabigatran Etexilate Mesylate) .Marland Kitchen.. 1 Capsule Two Times A Day  Allergies (verified): 1)  ! Penicillin  Past History:  Past Medical History: CAD CHF: systolic dysfunction with old IMI Hypertension Hyperlipidemia  BPH  Barrett's Esophagus Nephrolithiasis, hx of Colonic Polyps Diverticulitis, hx of GERD CAF - pradaxa anticoag   MD roster: cards - taylor uro  -grapey GI -prev orr, now stark pain - ramos   Review of Systems  The patient denies weight loss, chest pain, headaches, and abdominal pain.    Physical Exam  General:  alert, well-developed, well-nourished, and cooperative to examination.   wife at side Lungs:  normal respiratory effort, no intercostal retractions or use of accessory muscles; normal breath sounds bilaterally - no crackles and no wheezes.    Heart:  normal rate, regular rhythm, no murmur, and no rub. BLE without edema.  Skin:  irregular fluid filled blister left neck - no inflammation - mulitple dark nevi Psych:  Oriented X3, memory intact for recent and remote, normally interactive, good eye contact, not anxious appearing, not depressed appearing, and not agitated.      Complete Medication List: 1)  Carvedilol 6.25 Mg Tabs (Carvedilol) .... Take one tablet by mouth twice a day 2)  Aspirin 81 Mg Tbec (Aspirin) .... Take one tablet by mouth daily 3)  Hyzaar 50-12.5 Mg Tabs (Losartan potassium-hctz) .Marland Kitchen.. 1 tab daily 4)  Omeprazole 20 Mg Cpdr (Omeprazole) .Marland Kitchen.. 1 tab by mouth once daily 5)  Imdur 30 Mg Xr24h-tab (Isosorbide mononitrate) .... Once daily 6)  Simvastatin 40 Mg Tabs (Simvastatin) .Marland Kitchen.. 1 tab qhs 7)  Hydrocodone-acetaminophen 5-325 Mg Tabs (Hydrocodone-acetaminophen) .... As needed 8)  Vitamin B-12 (cyanocobalamin)  .Marland Kitchen.. 1 tab by mouth once daily 9)  Furosemide 20 Mg Tabs (Furosemide) .... Take 1/2 by mouth qd 10)  Vesicare 5 Mg Tabs (Solifenacin succinate) .... Take 1 po qd 11)  Fluticasone Propionate 50 Mcg/act Susp (Fluticasone propionate) .Marland Kitchen.. 1 spray each nostril once daily prn 12)  Flomax 0.4 Mg Caps (Tamsulosin hcl) .Marland Kitchen.. 1 by mouth once daily 13)  Avodart 0.5 Mg Caps (Dutasteride) .Marland Kitchen.. 1 by mouth once daily 14)  Pradaxa 150 Mg Caps (Dabigatran etexilate mesylate) .Marland Kitchen.. 1 capsule two times a day  Other Orders: TLB-Lipid Panel ([id number]) TLB-Hepatic/Liver Function Pnl (80076-HEPATIC) Dermatology  Referral (Derma)  Patient Instructions: 1)  it was good to see you today. 2)  test(s) ordered today - your results will be posted on the phone tree for review in 48-72 hours from the time of test completion; call [phone] and enter your 9 digit MRN (listed above on this page, just below your name); if any changes need to be made or there are abnormal results, you will be contacted directly.  3)  Please schedule a follow-up appointment in 3 months, sooner if problems.    Orders Added: 1)  Est. Patient Level IV [09811] 2)  TLB-Lipid Panel [[id number]] 3)  TLB-Hepatic/Liver Function Pnl [[id number]] 4)  Dermatology Referral [Derma]

## 2010-09-05 ENCOUNTER — Encounter: Payer: Self-pay | Admitting: Cardiovascular Disease

## 2010-09-07 ENCOUNTER — Other Ambulatory Visit: Payer: Self-pay | Admitting: Internal Medicine

## 2010-09-15 ENCOUNTER — Ambulatory Visit: Payer: Medicare Other | Admitting: Cardiovascular Disease

## 2010-09-20 LAB — POCT I-STAT, CHEM 8
BUN: 11 mg/dL (ref 6–23)
Calcium, Ion: 1.12 mmol/L (ref 1.12–1.32)
Chloride: 106 meq/L (ref 96–112)
Creatinine, Ser: 1.1 mg/dL (ref 0.4–1.5)
Glucose, Bld: 132 mg/dL — ABNORMAL HIGH (ref 70–99)
HCT: 39 % (ref 39.0–52.0)
Hemoglobin: 13.3 g/dL (ref 13.0–17.0)
Potassium: 3.7 mEq/L (ref 3.5–5.1)
Sodium: 143 meq/L (ref 135–145)
TCO2: 26 mmol/L (ref 0–100)

## 2010-09-20 LAB — BASIC METABOLIC PANEL WITH GFR
BUN: 12 mg/dL (ref 6–23)
Calcium: 8.2 mg/dL — ABNORMAL LOW (ref 8.4–10.5)
Creatinine, Ser: 1.15 mg/dL (ref 0.4–1.5)
GFR calc non Af Amer: >60 mL/min (ref >60)
Glucose, Bld: 141 mg/dL — ABNORMAL HIGH (ref 70–99)

## 2010-09-20 LAB — CK TOTAL AND CKMB (NOT AT ARMC)
CK, MB: 1.1 ng/mL (ref 0.3–4.0)
Relative Index: INVALID (ref 0.0–2.5)
Total CK: 71 U/L (ref 7–232)

## 2010-09-20 LAB — POCT CARDIAC MARKERS
CKMB, poc: <1 ng/mL — ABNORMAL LOW (ref 1.0–8.0)
Myoglobin, poc: 98.5 ng/mL (ref 12–200)
Troponin i, poc: <0.05 ng/mL (ref 0.00–0.09)

## 2010-09-20 LAB — DIFFERENTIAL
Basophils Absolute: 0 10*3/uL (ref 0.0–0.1)
Eosinophils Relative: 2 % (ref 0–5)
Lymphocytes Relative: 23 % (ref 12–46)
Monocytes Absolute: 0.5 10*3/uL (ref 0.1–1.0)
Monocytes Relative: 7 % (ref 3–12)
Neutro Abs: 5 10*3/uL (ref 1.7–7.7)

## 2010-09-20 LAB — CBC
HCT: 36.5 % — ABNORMAL LOW (ref 39.0–52.0)
HCT: 39.3 % (ref 39.0–52.0)
Hemoglobin: 12.7 g/dL — ABNORMAL LOW (ref 13.0–17.0)
Hemoglobin: 13.6 g/dL (ref 13.0–17.0)
MCHC: 34.6 g/dL (ref 30.0–36.0)
MCHC: 34.7 g/dL (ref 30.0–36.0)
MCV: 91.4 fL (ref 78.0–100.0)
Platelets: 120 K/uL — ABNORMAL LOW (ref 150–400)
RBC: 3.99 MIL/uL — ABNORMAL LOW (ref 4.22–5.81)
RBC: 4.4 MIL/uL (ref 4.22–5.81)
RDW: 14.6 % (ref 11.5–15.5)
RDW: 14.8 % (ref 11.5–15.5)
WBC: 7 10*3/uL (ref 4.0–10.5)

## 2010-09-20 LAB — BASIC METABOLIC PANEL
CO2: 27 mEq/L (ref 19–32)
CO2: 29 mEq/L (ref 19–32)
Calcium: 8.7 mg/dL (ref 8.4–10.5)
Chloride: 110 mEq/L (ref 96–112)
Creatinine, Ser: 1.13 mg/dL (ref 0.4–1.5)
GFR calc Af Amer: >60 mL/min (ref >60)
GFR calc Af Amer: >60 mL/min (ref >60)
Potassium: 3.4 mEq/L — ABNORMAL LOW (ref 3.5–5.1)
Sodium: 141 mEq/L (ref 135–145)
Sodium: 141 mEq/L (ref 135–145)

## 2010-09-20 LAB — APTT: aPTT: 35 seconds (ref 24–37)

## 2010-09-20 LAB — BRAIN NATRIURETIC PEPTIDE: Pro B Natriuretic peptide (BNP): 78 pg/mL (ref 0.0–100.0)

## 2010-09-20 LAB — TROPONIN I: Troponin I: <0.01 ng/mL (ref 0.00–0.06)

## 2010-09-20 LAB — MRSA PCR SCREENING: MRSA by PCR: NEGATIVE

## 2010-09-20 LAB — HEPARIN LEVEL (UNFRACTIONATED): Heparin Unfractionated: 0.44 IU/mL (ref 0.30–0.70)

## 2010-10-05 ENCOUNTER — Encounter: Payer: Self-pay | Admitting: Cardiovascular Disease

## 2010-10-05 ENCOUNTER — Ambulatory Visit (INDEPENDENT_AMBULATORY_CARE_PROVIDER_SITE_OTHER): Payer: Medicare Other | Admitting: Cardiovascular Disease

## 2010-10-05 DIAGNOSIS — Z01810 Encounter for preprocedural cardiovascular examination: Secondary | ICD-10-CM

## 2010-10-05 DIAGNOSIS — I10 Essential (primary) hypertension: Secondary | ICD-10-CM

## 2010-10-05 DIAGNOSIS — I509 Heart failure, unspecified: Secondary | ICD-10-CM

## 2010-10-05 DIAGNOSIS — I2510 Atherosclerotic heart disease of native coronary artery without angina pectoris: Secondary | ICD-10-CM

## 2010-10-05 DIAGNOSIS — Z95810 Presence of automatic (implantable) cardiac defibrillator: Secondary | ICD-10-CM

## 2010-10-05 DIAGNOSIS — I110 Hypertensive heart disease with heart failure: Secondary | ICD-10-CM

## 2010-10-05 DIAGNOSIS — I4891 Unspecified atrial fibrillation: Secondary | ICD-10-CM

## 2010-10-05 DIAGNOSIS — E7800 Pure hypercholesterolemia, unspecified: Secondary | ICD-10-CM

## 2010-10-05 NOTE — Assessment & Plan Note (Signed)
Cholesterol is at goal.  Continue current dose of statin and diet Rx.  No myalgias or side effects.  F/U  LFT's in 6 months. Lab Results  Component Value Date   LDLCALC 53 12/26/2006

## 2010-10-05 NOTE — Patient Instructions (Signed)
Schedule F/U with Dr. Rylend Pietrzak in 6 months.  

## 2010-10-05 NOTE — Assessment & Plan Note (Signed)
Stable.  Activity limited by knee pain.  Weight stable about 205 at home.  Continue current meds

## 2010-10-05 NOTE — Progress Notes (Signed)
Jacob [name] returns today for followup.  He is a pleasant 77 yo man with a h/o an ICM, EF 25%, class 1 CHF, HTN and dyslipidemia.  He denies c/p or sob.  He does have problems with arthritis in his knees.  He has had no intercurrent ICD therapies.  He was recathed in July 2010 and had a chronically occluded RCA with collaterals and no progression of left sided disease.  Significant bilateral knee pain. Injections by Ramos.  May need left TKR with Norris.  Suspect he will need right and left cath to clear for surgery given collateralized RCA.  ROS: Denies fever, malais, weight loss, blurry vision, decreased visual acuity, cough, sputum, SOB, hemoptysis, pleuritic pain, palpitaitons, heartburn, abdominal pain, melena, lower extremity edema, claudication, or rash.   General: Affect appropriate Healthy:  appears stated age HEENT: normal Neck supple with no adenopathy JVP normal no bruits no thyromegaly Lungs clear with no wheezing and good diaphragmatic motion Heart:  S1/S2 no murmur,rub, gallop or click PMI normal Abdomen: benighn, BS positve, no tenderness, no AAA no bruit.  No HSM or HJR Distal pulses intact with no bruits No edema Neuro non-focal Skin warm and dry No muscular weakness   Current Outpatient Prescriptions  Medication Sig Dispense Refill  . aspirin 81 MG tablet Take 81 mg by mouth daily.        . carvedilol (COREG) 6.25 MG tablet Take 6.25 mg by mouth 2 (two) times daily with a meal.        . cyanocobalamin 100 MCG tablet Take 100 mcg by mouth daily.        . dabigatran (PRADAXA) 150 MG CAPS Take 150 mg by mouth every 12 (twelve) hours.        . dutasteride (AVODART) 0.5 MG capsule Take 0.5 mg by mouth daily.        . fluticasone (FLONASE) 50 MCG/ACT nasal spray 2 sprays by Nasal route as needed.        . furosemide (LASIX) 20 MG tablet Take 10 mg by mouth daily.        Marland Kitchen HYDROcodone-acetaminophen (NORCO) 5-325 MG per tablet Take 1 tablet by mouth every 6 (six) hours as  needed.        . isosorbide mononitrate (IMDUR) 30 MG 24 hr tablet Take 30 mg by mouth daily.        Marland Kitchen losartan-hydrochlorothiazide (HYZAAR) 50-12.5 MG per tablet Take 1 tablet by mouth daily.        Marland Kitchen omeprazole (PRILOSEC) 20 MG capsule Take 20 mg by mouth daily.        . simvastatin (ZOCOR) 40 MG tablet Take 40 mg by mouth at bedtime.        . solifenacin (VESICARE) 5 MG tablet Take 5 mg by mouth daily.       . Tamsulosin HCl (FLOMAX) 0.4 MG CAPS TAKE ONE CAPSULE BY MOUTH EVERY DAY  30 capsule  6    Allergies  Penicillins  Electrocardiogram:   NSR 77 Normal ECG  Assessment and Plan

## 2010-10-05 NOTE — Assessment & Plan Note (Signed)
May need left and right heart cath. To clear for knee surgery.  Functional class one.  But AICD, CAD and EF 25% makes him moderate to high risk for knee surgery

## 2010-10-05 NOTE — Assessment & Plan Note (Signed)
Well controlled.  Continue current medications and low sodium Dash type diet.    

## 2010-10-05 NOTE — Assessment & Plan Note (Signed)
No D/C normal visit with Dr Ladona Ridgel.,

## 2010-10-22 ENCOUNTER — Encounter: Payer: Self-pay | Admitting: *Deleted

## 2010-10-22 ENCOUNTER — Telehealth: Payer: Self-pay | Admitting: Cardiovascular Disease

## 2010-10-22 DIAGNOSIS — I509 Heart failure, unspecified: Secondary | ICD-10-CM

## 2010-10-22 DIAGNOSIS — Z01810 Encounter for preprocedural cardiovascular examination: Secondary | ICD-10-CM

## 2010-10-22 NOTE — Telephone Encounter (Signed)
Pt wife states dr Eden Emms told her that pt would need some test before giving sur clearance for knee.

## 2010-10-22 NOTE — Telephone Encounter (Signed)
Spoke with pt wife, per dr Eden Emms last office note, pt will need right and left cath prior to procedure. Cath will be scheduled for 11-05-10. Pt will have labs done 11-02-10. Instructions will be mailed to [name]

## 2010-10-22 NOTE — Telephone Encounter (Signed)
Addended by: Deliah Goody on: 10/22/2010 03:32 PM   Modules accepted: Orders

## 2010-10-27 NOTE — Assessment & Plan Note (Signed)
[facility]                            CARDIOLOGY OFFICE NOTE   KEEFE, ZAWISTOWSKI                       MRN:          [id number]  DATE:03/22/2007                            DOB:          12-Aug-1931    Mr. Edgerly returns today for followup.  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  He  has had an occluded right with collaterals.   He has had an AICD placed.  He continues to have chest pain.  The pain  is a little bit atypical, it actually sounds more musculoskeletal.  It  starts in the back and radiates to the front.  It is exertional,  however.  His wife indicates that nitroglycerin takes care of it.   I explained to Tyrin that he has had a recent cath and Myoview which  were fairly low risk, and I did not see the need to rush into  reintervention.  The patient also has noted a bit of a cough.  There has  been no sputum production, although he has been on lisinopril for a long  time.  I told him that we may want to switch this to an angiotensin  receptor blocker.   The patient has otherwise been doing well.  He has been compliant with  his medications.  There has been no AICD discharges.  He has not had any  syncope or palpitations.  There has been diaphoresis, PND or orthopnea.  The patient does have significant arthritis in his knees and ankles.  This limits his ambulative ability.   CURRENT MEDICATIONS:  1. Coreg 3.125 b.i.d.  2. Lisinopril hydrochlorothiazide to be switched to Hyzaar 50/12.5.  3. An aspirin a day.  4. Flomax 0.4 a day.  5. Avodart 0.5 a day.  6. Simvastatin 40 a day.  7. Omeprazole 20 a day.   PHYSICAL EXAMINATION:  His exam is remarkable for dark-skinned  individual.  He is mostly [place].  His weight is 203, blood pressure 130/70, pulse 75 and regular.  Respiratory rate is 14.  Afebrile.  HEENT:  Normal.  Carotids normal without bruit.  There is  no  lymphadenopathy, no thyromegaly, no JVP elevation.  LUNGS:  Clear.  Good diaphragmatic motion, no wheezing.  There is an S1, S2 with normal heart sounds.  PMI is normal.  ABDOMEN:  Benign.  Bowel sounds are positive.  No tenderness, no triple  A, no hepatosplenomegaly or hepatojugular reflux.  Distal pulses are intact with no edema.  NEURO:  Nonfocal.  There is no muscular weakness.  He does have some  arthritis and decreased range of motion to both knees.   IMPRESSION:  1. Coronary disease.  Previous acute myocardial infarction with      remodeling.  No significant left-sided disease with collaterals to      the right.  Continue current medications including aspirin and beta      blocker.  2. Decreased ejection fraction, currently euvolemic, functional class      I, no evidence of volume overload or  congestive symptoms.  We will      switch his diuretic over to Hyzaar 12.5 mg.  3. Cardiomyopathy with need for afterload reduction cough on      lisinopril, switch to angiotensin receptor blocker.  4. Prostatism, continue Avodart and Flomax.  Currently stream is good      with no evidence of elevated PSA.  5. Hypercholesterolemia in the setting of coronary disease.  Continue      simvastatin 40 a day, followup lipid and liver profile in 6 months.  6. Reflux, counseled on low-spice diet and avoid late night meals with      recumbency.  Continue omeprazole 20 mg a day.   The patient has nitroglycerin which he takes for this somewhat atypical  chest pain.  Given the fact that he has collaterals, and he is convinced  that the nitroglycerin helps, we will start him on long-acting Imdur 15  mg a day.  I will see him back in about 3 months.  If the long-acting  Imdur helps, we will continue to try to treat him medically.  Again, he  has had a fairly recent workup in regards to cath an Myoview, and I  would like not to do a repeat intervention if not needed.  He will call  us if he has any  prolonged episodes of pain.     Noralyn Pick. Eden Emms, MD, [facility]  Electronically Signed    PCN/MedQ  DD: 03/22/2007  DT: 03/23/2007  Job #: [id number]

## 2010-10-27 NOTE — Consult Note (Signed)
NAME:  Jacob [name], Jacob [name]                ACCOUNT NO.:  [id number]   MEDICAL RECORD NO.:  [id number]          PATIENT TYPE:  INP   LOCATION:  1827                         FACILITY:  [facility]   PHYSICIAN:  Peter C. Eden Emms, MD, FACCDATE OF BIRTH:  21-Jul-1931   DATE OF CONSULTATION:  DATE OF DISCHARGE:                                 CONSULTATION   A 77 year old patient with known ischemic cardiomyopathy admitted to the  emergency room with prolonged episode of rest pain.   The patient was most recently seen in the office on December 13, 2008.  He  admitted to having increasing angina.  Plans were to have an outpatient  JV cath.  We had increased his beta-blocker and nitrates   He had a prolonged episode of pain today with some nausea.  The pain  lasted about 2 hours.  He is currently pain free in the ER.  He has had  some abdominal bloating, but no vomiting and diarrhea.  He has no  history of biliary colic.   The patient's point-of-care enzymes were negative in the ER, and he had  no acute EKG changes.   He will be started on heparin and Integrilin, and plans for inpatient  catheterization tomorrow will be made.   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  He is an EF of 25-30%.  He has not had overt heart  failure.  His potassium has been running a little bit low lately, and  this has been supplemented in lieu of his upcoming cath.  He was on low-  dose diuretics.   His review of systems is otherwise negative.  Ten-point review was done.  In particular regarding his GI symptoms, there is no previous history of  intestinal obstruction, diverticulitis, gallbladder disease.   His previous medical history is remarkable for an AICD placed by Dr.  Ladona Ridgel, hypercholesterolemia, hypertensive heart disease, decreased LV  function, ischemic cardiomyopathy, EF 25-30%.   He does have a history of reflux and was on Zegerid.   He also has a history of  prostatism and is on Avodart and Flomax.   He is allergic to PENICILLIN.   His family history is noncontributory.   The patient is married.  His wife is with him today.  He is retired.  He  is a nonsmoker and nondrinker.  He is relatively sedentary.   PHYSICAL EXAMINATION:  GENERAL:  Remarkable for an elderly male in no  acute distress.  Not diaphoretic.  Afebrile.  VITAL SIGNS:  Blood pressure 110/70.  He is in sinus rhythm in the 80s  with PACs.  Respiratory rate is 14.  HEENT:  Unremarkable.  NECK:  Carotids are normal without bruit.  No lymphadenopathy,  thyromegaly, or JVP elevation.  LUNGS:  Clear.  Good diaphragmatic motion.  No wheezing.  HEENT:  AICD in the left clavicle.  S1 and S2 with increased PMI and  normal heart sounds.  ABDOMEN:  Protuberant but nontender.  Bowel sounds positive.  No  rebound.  No AAA.  No hepatosplenomegaly or hepatojugular reflux.  Distal pulses are intact.  No edema.  NEUROLOGIC:  Nonfocal.  SKIN:  Warm and dry.  MUSCULOSKELETAL:  No muscular weakness.   EKG shows sinus rhythm with PACs and IVCD.  No acute changes.  Point-of-  care markers are negative.  Labs are otherwise remarkable for a normal  PT and PTT.  Hematocrit is 39.3, potassium is 3.9, creatinine is 1.0.  Chest x-ray is pending.   IMPRESSION:  1. Unstable angina in a patient with nonischemic cardiomyopathy.      Heparin and Integrilin will be started.  He will be maintained on      aspirin.  He will be started on intravenous nitroglycerin and      admitted to a step-down unit.  He understands the catheterization      as he has had one before and is willing to receive in the morning.  2. History of Barrett esophagus with reflux.  Continue Zegerid.  This      may be the etiology of his pain, but I am suspicious that it is      really angina.  We will continue Zegerid and possibly add Carafate.  3. History of prostatism.  I will hold his Flomax and continue his      Avodart.  I  would like some room to maneuver his intravenous      nitroglycerin,  4. Hypertension.  I will hold his Hyzaar for the time being since his      blood pressure is a little soft and he needs intravenous      nitroglycerin.  In lieu of his diuretic, I will place him on Lasix      20 mg as a one-time dose.  5. Hypercholesterolemia in the setting of coronary disease.  Continue      Zocor.   Further recommendations will be based on the results of his heart cath.      Noralyn Pick. Eden Emms, MD, [facility]  Electronically Signed     PCN/MEDQ  D:  12/17/2008  T:  12/18/2008  Job:  [id number]

## 2010-10-27 NOTE — Assessment & Plan Note (Signed)
[facility]                         ELECTROPHYSIOLOGY OFFICE NOTE   Jacob [name], Jacob [name]                       MRN:          [id number]  DATE:03/21/2007                            DOB:          02/23/1932    Jacob [name] returns today for followup.  He is a very pleasant man with a  history of an ischemic cardiomyopathy and symptomatic bradycardia and  congestive heart failure, status post dual-chamber ICD implantation.  He  returns today for followup.  His only complaint today is that of  atypical chest discomfort; it is not related to exertion; it gets better  with nitroglycerin.  He denies worsening shortness of breath and  peripheral edema.   MEDICATIONS:  1. Coreg 3.125 twice daily.  2. Lisinopril/hydrochlorothiazide 20/12.5 daily.  3. Aspirin one a day.  4. Flomax 0.4 a day.  5. Simvastatin 40 a day.  6. Omeprazole daily.   PHYSICAL EXAMINATION:  GENERAL:  He is a pleasant, well-appearing man in  no distress.  His blood pressure was 122/68 and the pulse was 94 and regular and the  respirations 16.  The weight was 203 pounds.  NECK:  Revealed no jugulovenous distention.  LUNGS:  Clear bilaterally to auscultation; no wheezes, rhonchi or rales  were present.  CARDIOVASCULAR:  Exam revealed a regular rate and rhythm with normal S1  and S2.  EXTREMITIES:  Demonstrated no cyanosis, clubbing or edema.  The pulses  were 2+ and symmetric.   Interrogation of his defibrillator demonstrated a Editor, commissioning dual-chamber device with P and R waves of 2 and 9,  respectively, the impedance of 576 in the atrium and 902 in the  ventricle, a threshold of 0.8 at 0.5 in the atrium and 0.4 at 0.5 in the  right ventricle.  He was 2% A-paced.   IMPRESSION:  1. Ischemic cardiomyopathy.  2. Congestive heart failure.  3. Symptomatic bradycardia.  4. Status post dual-chamber implantable cardioverter-defibrillator      insertion.   DISCUSSION:   Overall, Jacob [name] is stable.  His defibrillator is working  normally.  We will plan on seeing him back in the office in 1 year.    Doylene Canning. Ladona Ridgel, MD  Electronically Signed   GWT/MedQ  DD: 03/21/2007  DT: 03/22/2007  Job #: [id number]

## 2010-10-27 NOTE — Assessment & Plan Note (Signed)
[facility]                            CARDIOLOGY OFFICE NOTE   Jacob [name]                       MRN:          [id number]  DATE:01/25/2007                            DOB:          June 23, 1931    Jacob [name] returns today for followup. He has an ischemic cardiomyopathy. He  has an occluded right with collateralization. His ejection fraction had  been in the 30% range. His just had his AICD placed about 2 weeks ago.   The patient has had some borderline high blood pressure readings at  home. He has been having mild exertional dyspnea and occasional  lightheadedness.   The exertional dyspnea actually seems to be improving. There has been no  cough or sputum production. No pleuritic pain. He has no lower extremity  edema.   The patient had his AICD checked by Gunnar Fusi on January 12, 2007. Thresholds  were in a good range. No changes to the parameters were made. There was  1 mode switch which was only 4 seconds long.   The patient has been compliant with his meds. In regards to his  dizziness, it does not seem postural in nature. It is not associated  with palpitations. It is associated with the movement of his head to the  left slightly. It is actually improving on it's own.   The patient has not had any significant PND or orthopnea. He is able to  sleep through the night. He thinks his weight is stable and there has  been no lower extremity edema.   REVIEW OF SYSTEMS:  Otherwise unremarkable. His meds include;  1. Coreg 3.125 b.i.d.  2. Lisinopril/hydrochlorothiazide 20/12.5.  3. An aspirin a day.  4. Flomax 0.4 a day.  5. Avodart 0.5 a day.  6. Simvastatin 40 a day.  7. Omeprazole 20 a day.   PHYSICAL EXAMINATION:  Remarkable for a healthy appearing, well tanned  mild aged male, in no distress. Affect is appropriate. Weight is 199,  blood pressure in our office is 158/60, pulse is 66 and regular. He is  afebrile. Respiratory rate is 14.  HEENT: Normal. Carotids normal without bruits. There is no  lymphadenopathy. No thyromegaly. No JVP elevation.  LUNGS: Clear with good diaphragmatic motion and no wheezing.  AICD is under the left clavicle. Incision looks good. There is minor  tenderness and no ecchymosis. S1, S2 with normal heart sounds. PMI is  increased but not laterally displaced. There are no murmurs.  ABDOMEN: Benign. Bowel sounds positive. No hepatosplenomegaly or  hepatojugular reflux. No triple A. No bruits. No tenderness.  Femorals are +3 bilaterally without bruits. Distal pulses are intact  with no edema.  SKIN: Warm and dry and well tanned.   The patient's cholesterol which was checked on December 30, 2006 showed an  LDL cholesterol of 53, with normal LFTs.   IMPRESSION:  1. Ischemic cardiomyopathy no evidence of chest pain, currently      euvolemic. We will probably check a BNP and a BMET in 3 months.      Continue current medications. Functional class 1.  2.  Coronary artery disease with no significant angina. Good risk      factor modification. Continue current therapy. He will call me if      he has any chest pain. He does have nitroglycerine at home.  3. Atrial implantable cardioverter-defibrillator and ventricular      tachycardia prophylaxis, wound healing well. Parameters look fine,      only 1 very short burst of mode switching. No indication for work      up of this. The patient will follow up with Gunnar Fusi in the device      clinic in 3 months.  4. Hypercholesterolemia, LDL in an excellent range. Currently on      simvastatin on 40 a day. Follow up liver tests in 6 months.  5. Hypertension, borderline control. The patient will continue to      monitor his blood pressure at home. If his systolics continue to      run above 140, I will increase his lisinopril/hydrochlorothiazide      to 40/12.5.  6. Prostatism.  Continue combination of Flomax and Avodart. Urinary      stream is good. There has been no  hematuria.  7. Reflux, continue omeprazole 20 a day, avoid late night meals, and      low spice diet to go with low salt.   Overall, I think Jacob [name] is doing well and I will see him back in about 3  months.     Noralyn Pick. Eden Emms, MD, [facility]  Electronically Signed    PCN/MedQ  DD: 01/25/2007  DT: 01/26/2007  Job #: [id number]

## 2010-10-27 NOTE — Assessment & Plan Note (Signed)
[facility]                            CARDIOLOGY OFFICE NOTE   ALAIN, DESCHENE                       MRN:          [id number]  DATE:12/05/2006                            DOB:          03-15-1932    PRIMARY CARDIOLOGIST:  He is new to Dr. Charlton Haws.   PRIMARY CARE PHYSICIAN:  He is seen at [facility].   HISTORY OF PRESENT ILLNESS:  [name] is a 77 year old male patient who  is admitted with near syncope in the setting of dehydration. The patient  had an echocardiogram which surprisingly showed an EF of 30%. He was set  up for cardiac catheterization. This revealed an old inferior myocardial  infarction. He had a 60% stenosis in the mid RCA and 90% stenosis more  distally and there were fairly robust left to right collaterals. His EF  was 25% with inferior akinesis and global hypokinesis. He was treated  medically. He presents to the office today for post hospitalization  followup. He is doing well without any chest pain or shortness of  breath. He denies any syncope or near syncope. Denies any orthopnea or  paroxysmal nocturnal dyspnea. Denies any lower extremity edema. He does  noted quite a bit of fatigue since he was started on the medications  listed below.   CURRENT MEDICATIONS:  1. Coreg 3.125 mg b.i.d.  2. Lisinopril HCTZ 20/12.5 mg daily.  3. Aspirin 81 mg daily.  4. Flomax 0.4 mg daily.  5. Prilosec 20 mg daily.  6. Avodart 0.5 mg a day.  7. Simvastatin 40 mg q.h.s.  8. Nitroglycerin p.r.n. chest pain.   PHYSICAL EXAMINATION:  GENERAL:  He is a well-nourished, well-developed  man in no distress.  VITAL SIGNS:  Blood pressure is 128/76, pulse 87, weight 201 pounds.  HEENT:  Normal.  NECK:  Without JVD.  CARDIAC:  S1, S2. Regular rate and rhythm without murmurs.  LUNGS:  Clear to auscultation bilaterally without wheezes, rhonchi or  rales.  ABDOMEN:  Soft, nontender with normal active bowel sounds. No  organomegaly.  EXTREMITIES:  Without edema. Calves soft nontender.  SKIN:  Warm and dry.  NEUROLOGIC:  He is alert and oriented x3. Cranial nerves II-XII grossly  intact.  Right femoral artery site without hematoma or bruit.   Electrocardiogram  reveals sinus rhythm with a heart rate of 82,  frequent PVCs with bigeminal pattern and 1 couplet.   IMPRESSION:  1. Ischemic cardiomyopathy with an ejection fraction of 25-30%.  2. Coronary artery disease.      a.     Old inferior myocardial infarction with chronically occluded       distal right coronary artery with left to right collaterals.  3. Treated dyslipidemia.  4. Gastroesophageal reflux disease.      a.     History of Barrett's esophagus.  5. History of colon polyps.  6. History of nephrolithiasis.  7. Hypertension.  8. Benign prostatic hypertrophy.   PLAN:  The patient presents to the office today for post hospitalization  followup. From a cardiovascular standpoint, he is doing  well. He is only  describing fatigue. He denies chest pain or shortness of breath. He does  have an ischemic cardiomyopathy with an EF of less than 35%. I discussed  this with Dr. Eden Emms. Will arrange for him to be set up with the EP  service for consideration of AICD implantation. He will also be set up  to see Dr. Eden Emms in the next 4-6 weeks. He does have quite a bit of  ectopy on his electrocardiogram  today. Will make sure he has a BMET and  magnesium level done today and will also make sure he has a followup for  lipids and LFT's in about 6-8 weeks.      Tereso Newcomer, PA-C  Electronically Signed      Noralyn Pick. Eden Emms, MD, [facility]  Electronically Signed   SW/MedQ  DD: 12/05/2006  DT: 12/06/2006  Job #: [id number]

## 2010-10-27 NOTE — Discharge Summary (Signed)
NAMEJAHMEL, FLANNAGAN                ACCOUNT NO.:  [id number]   MEDICAL RECORD NO.:  [id number]          PATIENT TYPE:  OBV   LOCATION:  2017                         FACILITY:  [facility]   PHYSICIAN:  Doylene Canning. Ladona Ridgel, MD    DATE OF BIRTH:  1931-06-28   DATE OF ADMISSION:  12/28/2006  DATE OF DISCHARGE:  12/29/2006                               DISCHARGE SUMMARY   Dictation greater than 35 minutes along with examination.   ALLERGIES:  Patient has allergy to PENICILLIN.   FINAL DIAGNOSIS:  1. Ischemic cardiomyopathy, ejection fraction 30% with prior history      of inferior myocardial infarction, qualifies for ICD.  2. Discharging day one status post implant of [employer]      cardioverter defibrillator (dual-chamber).   SECONDARY DIAGNOSES:  1. Left heart catheterization November 22, 2006 evidence of inferior      myocardial infarction.      a.     Right coronary artery 60% midpoint, 90% distal with strong       left-to-right collaterals. Left main, left anterior descending and       left circumflex angiographically normal.  2. Presentation with fatigue and presyncope prior to discovery of      inferior myocardial infarction and prior to catheterization.  3. Hypertension.  4. Dyslipidemia.  5. Barrett's esophagus.  6. Nephrolithiasis.  7. Benign prostatic hypertrophy.  8. New York Heart Association class II chronic, systolic congestive      heart failure.   PROCEDURE:  December 28, 2006 implantation of [employer]  dual-chamber cardioverter defibrillator with defibrillator threshold  study less than or equal to 15 joules, Dr. Lewayne Bunting.  The patient  had no postprocedural complications.   BRIEF HISTORY:  Mr. Feuerborn is a 77 year old male who was admitted to the  hospital after an abnormal electrocardiogram and echocardiogram which  showed ejection fraction 25-30%. His presentation was with fatigue and  presyncope.   The patient underwent catheterization on November 22, 2006. The study showed  disease in the right coronary artery 60% midpoint and 90% distal but  strong left-to-right collaterals.  He had global hypokinesis and  inferior akinesis.   Over the past several months the patient has had more fatigue and  weakness, although he has never had classic symptoms of myocardial  infarction.  He has never had frank syncope.  He does have palpitations  at times which have never been characterized. He denies chest pain with  palpitations.  He had dyspnea with exertion and his New York Heart  Association heart failure is basically class II chronic systolic  congestive heart failure.  The patient qualifies for cardioverter  defibrillator for prophylactic prevention. The risks and benefits have  been described to the patient who wishes to proceed.   HOSPITAL COURSE:  The patient presented electively December 28, 2006. He  underwent successful cardioverter defibrillator implant by Dr. Ladona Ridgel.  He has had no postprocedural complications except that he says that the  incision site is very sore. For this he is taking oral analgesia with  success. Patient discharging postprocedure  day #1.  He is asked to keep  his incision dry for next 7 days, to sponge bathe until Wednesday July  23.  He is asked not to drive for 1 week.  Mobility of the left arm has  been described to the patient.  A chest x-ray is a portable chest x-ray  which only poorly demonstrates right ventricular lead placement.  However, the interrogation of the device postprocedure day #1 shows  normal function, all values within normal limits.   DISCHARGE MEDICATIONS:  1. Coreg 3.125 mg twice daily.  2. Lisinopril/hydrochlorothiazide 20/12.5 one daily.  3. Aspirin 81 mg daily.  4. Flomax 0.4 mg daily.  5. Avodart 0.5 mg daily.  6. Simvastatin 40 mg daily at bedtime.  7. Prilosec 20 mg daily.  8. New medication Darvocet N 100 one to two tablets every 4-6 hours as      needed.   He follows up  with [facility] [address] ICD  clinic Thursday July 31 at 10:30. He will see Dr. Ladona Ridgel in November.  Dr. Lubertha Basque office will call with that appointment.   LAB STUDIES PERTINENT TO THIS ADMISSION:  Sodium is 143, potassium 3.7,  chloride 109, carbonate 26, glucose 109, BUN is 13, creatinine 1.04.  His protime was 15.0, INR 1.2.  Complete blood count; white cells 7.9,  hemoglobin 13.7, hematocrit 39.9, platelets 163.      Maple Mirza, PA      Doylene Canning. Ladona Ridgel, MD  Electronically Signed    GM/MEDQ  D:  12/29/2006  T:  12/30/2006  Job:  [id number]   cc:   Everett Graff C. Eden Emms, MD, [facility]

## 2010-10-27 NOTE — Op Note (Signed)
NAMECLYDELL, Jacob [name]                ACCOUNT NO.:  [id number]   MEDICAL RECORD NO.:  [id number]          PATIENT TYPE:  INP   LOCATION:  2017                         FACILITY:  [facility]   PHYSICIAN:  Doylene Canning. Ladona Ridgel, MD    DATE OF BIRTH:  Nov 24, 1931   DATE OF PROCEDURE:  12/28/2006  DATE OF DISCHARGE:                               OPERATIVE REPORT   PROCEDURE PERFORMED:  Implantation of a dual-chamber ICD.   INDICATIONS:  Ischemic cardiomyopathy, class II heart failure, frequent  PVCs, sinus bradycardia.   INTRODUCTION:  The patient is a very pleasant, 77 year old male who  experienced a silent myocardial infarction in the past, was found to  have an occluded right coronary artery and severe LV dysfunction with  remodeling.  His EF is 30%.  He is now referred for ICD implantation.  Because of his sinus bradycardia and the need for AV nodal blocking  drugs and sinus node slowing drugs, he will undergo implantation of a  dual chamber defibrillator.   PROCEDURE:  After informed consent was obtained, the patient was taken  to the diagnostic EP lab in the fasting state.  After usual preparation  and draping, 30 mL lidocaine was infiltrated in left infraclavicular  region.  A 7-cm incision was carried out over this region.  Electrocautery utilized to dissect down to the fascial plane.  Left  subclavian vein was punctured x2, and a Guidant model 40/87 active  fixation pacing lead, serial number [id number], was advanced to the right  atrium, and the Guidant model [id number] active fixation defibrillation  lead was advanced to the right ventricle.  Mapping was carried out in  the right ventricle.  In the final site, R-waves were 10 mV, the pace  impedance with the lead actively fixed 835 ohms and a threshold of 0.4  volts at 0.5 milliseconds with nice injury current demonstrated.  Ten-  volt pacing did not stimulate the diaphragm.  With the ventricular lead  in satisfactory position, attention was  then turned to placement of the  atrial lead which was placed in the anterolateral portion of the right  atrial appendage with P-waves of 3 mV and the pacing impedance 590 ohms,  threshold of 1.1 volts at 0.5 milliseconds.  Again, 10-volt pacing did  not stimulate diaphragm.  With these satisfactory parameters, the lead  was secured to the subpectoralis fascia with a figure-of-eight silk  suture.  The sewing sleeve was secured with a silk suture.  Electrocautery was utilized to make a subcutaneous pocket.  Kanamycin  irrigation was utilized to irrigate the pocket and electrocautery  utilized to assure hemostasis.  [employer] dual-  chamber defibrillator serial number [id number] was connected to the atrial  and defibrillation leads and placed back in the subcutaneous pocket.  Generator was secured with skin silk suture.  At this point,  defibrillation threshold testing was carried out.   After the patient was deeply sedated fentanyl and Versed, VF was induced  with T-wave shock.  A 17-joule shock was delivered which terminated VF  and restored sinus rhythm.  Five  minutes were allowed to elapse, and the  second defibrillation threshold test carried out.  Again, VF was induced  with a T-wave shock, and again, a 17-joule shock was delivered which  terminated VF and restored sinus rhythm.  At this point, no additional  defibrillation threshold testing was carried out.  The incision closed  with a layer of 2-0 Vicryl followed by layer of 3-0 Vicryl followed by  layer of 4-0 Vicryl.  Benzoin was painted on the skin, and Steri-Strips  were applied, and a pressure dressing was placed, and the patient was  returned to his room in satisfactory condition.   COMPLICATIONS:  There were no immediate procedure complications.   RESULTS:  Demonstrate successful implantation of a Guidant dual-chamber  ICD in a patient with an ischemic cardiomyopathy and congestive heart  failure, EF  30%.      Doylene Canning. Ladona Ridgel, MD  Electronically Signed     GWT/MEDQ  D:  12/28/2006  T:  12/29/2006  Job:  [id number]   cc:   Noralyn Pick. Eden Emms, MD, [facility]

## 2010-10-27 NOTE — Assessment & Plan Note (Signed)
[facility]                            CARDIOLOGY OFFICE NOTE   STIVEN, KASPAR                       MRN:          [id number]  DATE:06/27/2007                            DOB:          03-Apr-1932    Murphy returns today in follow-up. He has ischemic cardiomyopathy with  an AICD. His EF is the 25-30% range.  His last cath in June 2008 showed  occluded right with collaterals without significant left-sided disease.  He had quite a bit a remodeling which I think explains his decreased LV  function.  He has been doing well.  He is not having any significant  PND, orthopnea.  He has mild exertional dyspnea.  There has been no  lower extremity edema.   He is confused somewhat about his AICD follow-up.  We will try to  clarify this.  He has not had any defibrillator firings.   REVIEW OF SYSTEMS:  Otherwise negative.   MEDICATIONS:  1. Include Coreg 3.125 b.i.d.  2. Lisinopril 20/12.5.  3. Aspirin a day.  4. Avodart  0.5 a day.  5. Simvastatin 40 a day.  6. Omeprazole 20 a day.   PHYSICAL EXAMINATION:  Is remarkable for a chronically ill-appearing  elderly white male in no distress.  Weight is 206, blood pressure is  115/64, pulse is 80 and regular, respiratory rate 14, afebrile.  HEENT:  Unremarkable.  Carotids are normal without bruit, no  lymphadenopathy, thyromegaly or JVP elevation.  LUNGS:  Clear with good diaphragmatic motion.  No wheezing.  S1-S2 with distant heart sounds.  PMI not palpable; AICD under left  clavicle in good position.  Nontender.  ABDOMEN:  Benign.  Bowel sounds  positive.  No bruit. No hepatosplenomegaly. No hepatojugular reflux.  Distal pulses intact, no edema.  NEURO: Was nonfocal.  SKIN:  Warm and dry.  No muscular weakness.   EKG shows sinus rhythm with occasional PVCs and intermittent pacing.   IMPRESSION:  1. Ischemic cardiomyopathy, currently functional class I.  Continue      current dose of medication;  appears euvolemic.  2. We talked about follow-up today in regards to straightening out his      AICD followup.  October 2008 his AICD was working fine.  It is a      [employer], political [employer].  He will follow up with Dr. Ladona Ridgel soon.  3. Prostatism.  Urinary stream is good.  Continue Avodart 0.4-day.  4. Hypercholesterolemia in the setting of known coronary disease.      Continue low simvastatin 40 a day; lipid and liver profile in 6      months.  5. History of reflux.  Try to lose weight, avoid late night meals and      spicy foods.  Continue Prilosec  over-the-counter.   Overall, Dheeraj is doing well.  I will see him back in 6 months.     Noralyn Pick. Eden Emms, MD, [facility]  Electronically Signed    PCN/MedQ  DD: 06/27/2007  DT: 06/27/2007  Job #: [id number]

## 2010-10-27 NOTE — Assessment & Plan Note (Signed)
 HEALTHCARE                         ELECTROPHYSIOLOGY OFFICE NOTE   EROL, FLANAGIN                       MRN:          [id number]  DATE:03/26/2008                            DOB:          [birth year]    Mr. Postlewait returns today for followup.  He is a very pleasant elderly  male with an ischemic cardiomyopathy, congestive heart failure.  He  returns today for followup.  He denies chest pain.  He denies shortness  of breath.  He had no specific complaints today.  He does have some very  minimal discomfort at his defibrillator insertion site, though this is  not particularly severe or swollen.   MEDICINES:  1. Coreg 3.125 twice daily.  2. Aspirin 81 a day.  3. Flomax 0.4 a day.  4. Avodart 0.5 a day.  5. Simvastatin 40 a day.  6. Isosorbide 30 mg half-tablet daily.  7. Zegerid 40 a day.  8. Hyzaar 50/12.5 daily.   PHYSICAL EXAMINATION:  GENERAL:  He is a pleasant, well-appearing  elderly man in no distress.  VITAL SIGNS:  Blood pressure was 105/65, the pulse was 100 and regular,  respirations were 18, the weight was 213 pounds.  NECK:  No jugular venous distention.  LUNGS:  Clear bilaterally to auscultation.  No wheezes, rales, or  rhonchi are present.  There are no increased work of breathing.  CARDIOVASCULAR:  Regular rate and rhythm.  Normal S1 and S2.  No  murmurs, rubs, or gallops present.  PMI was enlarged.  ABDOMEN:  Soft  and nontender.  There is no organomegaly.  Bowel sounds are present.  No  rebound or guarding.  EXTREMITIES:  No cyanosis, clubbing, or edema.  Pulses were 2+ and  symmetric.   Interrogation of his defibrillator demonstrates [employer] E-030.  His P and R waves of 2 and 9 respectively.  The  impedance 647 in the A, 1010 in the V.  His threshold was 1.2 at 0.6 in  the atrium and 0.6 at 0.6 in the right ventricle.  Battery voltage was  2.99 V.  Underlying rhythm was sinus at 100.  He was 14% A paced.   There  are no intercurrent IC therapies.   IMPRESSION:  1. Ischemic cardiomyopathy.  2. Congestive heart failure.  3. Status post implantable cardioverter-defibrillator insertion.   DISCUSSION:  Overall, Jacob [name] is stable.  His defibrillator is working  normally.  His heart failure is well controlled.  He is having no active  anginal symptoms.  We will plan to see the patient back for ICD followup  in 1 year.     Doylene Canning. Ladona Ridgel, MD  Electronically Signed    GWT/MedQ  DD: 03/26/2008  DT: 03/27/2008  Job #: [id number]

## 2010-10-27 NOTE — Assessment & Plan Note (Signed)
[facility]                         ELECTROPHYSIOLOGY OFFICE NOTE   CARLTON, BUSKEY                       MRN:          [id number]  DATE:01/12/2007                            DOB:          [birth year]    Mr. Schmid was seen in the clinic today on July 31 for a wound check of  his newly-implanted [employer] model number [id number].  Date of  implant was December 28, 2006, for ischemic cardiomyopathy.  On  interrogation of his device today, his battery voltage is 3.22 with a  charge time of 8.2 seconds.  P waves measured 2.9 millivolts with an  atrial capture threshold of 0.8 volts at 0.5 milliseconds and an atrium  lead impedance of 665 ohms.  R waves measured 10.8 millivolts with a  ventricular pacing threshold of 0.2 volts at 0.6 milliseconds and a  ventricular lead impedance of 871 ohms.  Shock impedance was 45.  There  were two VF episodes noted, both induced at the time of implant.  No  other episodes.  No changes were made in his parameters today.  There  was once mode switch that was only 4 seconds long.  Steri-Strips were  removed.  His wound is without redness or edema, and he will follow up  with Dr. Ladona Ridgel in October.      Altha Harm, LPN  Electronically Signed      Doylene Canning. Ladona Ridgel, MD  Electronically Signed   PO/MedQ  DD: 01/12/2007  DT: 01/12/2007  Job #: [id number]

## 2010-10-27 NOTE — Cardiovascular Report (Signed)
NAME:  Jacob [name], Jacob [name]                ACCOUNT NO.:  [id number]   MEDICAL RECORD NO.:  [id number]          PATIENT TYPE:  INP   LOCATION:  2020                         FACILITY:  [facility]   PHYSICIAN:  Noralyn Pick. Eden Emms, MD, FACCDATE OF BIRTH:  25-Sep-1931   DATE OF PROCEDURE:  12/18/2008  DATE OF DISCHARGE:                            CARDIAC CATHETERIZATION   INDICATIONS:  Question unstable angina, previous history of total  occlusion of distal right coronary artery with collaterals.   Cine catheterization was done with 5-French catheters from right femoral  artery.   Left main coronary artery was calcified with a 20% proximal stenosis.   Proximal LAD was calcified, however, there was no significant stenosis  in the proximal, mid, and distal LAD.  First diagonal branch was normal.   Circumflex coronary artery was nondominant and it was normal.  First  obtuse marginal branch was normal.   There were significant left-to-right collaterals from septal  perforators; did fill the posterior lateral branch, PDA, and distal  right coronary artery.   The right coronary artery was dominant that was 100% occluded distally,  which resolved and similar to appearance from 2005.   RAO ventriculography:  RAO ventriculography was somewhat poorly failed.  There appeared to be inferior wall akinesis.  The EF was probably in the  40% range.  There was no significant MR.  LV pressure was 164/83 and LV  pressure was 164/10.   IMPRESSION:  The patient has single-vessel coronary artery disease with  a total right coronary artery that is collateralized.  There has been no  change in his coronary anatomy since 2005.  This would indicate that his  chest pains are noncardiac in etiology.   The patient apparently has a history of reflux with Barrett esophagus.  I will make outpatient arrangements for him to see our GI doctors for  probable EGD.   He will continue on his Zegerid.      Noralyn Pick. Eden Emms, MD,  [facility]  Electronically Signed     PCN/MEDQ  D:  12/18/2008  T:  12/19/2008  Job:  [id number]

## 2010-10-27 NOTE — H&P (Signed)
Jacob [name], Jacob [name]                ACCOUNT NO.:  [id number]   MEDICAL RECORD NO.:  [id number]          PATIENT TYPE:  EMS   LOCATION:  MAJO                         FACILITY:  [facility]   PHYSICIAN:  Doug Sou, M.D.   DATE OF BIRTH:  1932/02/27   DATE OF ADMISSION:  11/18/2006  DATE OF DISCHARGE:                              HISTORY & PHYSICAL   PRIMARY CARDIOLOGIST:  Will be Dr. Charlton Haws.   PRIMARY CARE Saliyah Gillin:  He is seen by [facility] in  [place].   PATIENT PROFILE:  A 77 year old Native-American male with no prior  history of CAD who presents with a 4-day history of lightheadedness and  orthostatic symptoms.   PROBLEM LIST:  1. Lightheadedness.  2. A history of Barrett's esophagitis and GERD.  3. A history of colon polyps.  4. Nephrolithiasis in the 1980s.  5. Hypertension for approximately 20+ years.  6. BPH.   ALLERGIES:  NO KNOWN DRUG ALLERGIES.   HISTORY OF PRESENT ILLNESS:  A 76 year old Native-American male with no  prior history of CAD.  He has no history of chest pain or shortness of  breath.  He is very active without limitation in activities, currently  doing a lot of gardening and building a greenhouse.  He has been working  outside in the mid 90 to 100-degree heat over the past 4 days, taking  frequent breaks, but generally without any chest pains or shortness of  breath.  He is not sure that he has been hydrating himself very well  though.  Over the same span of time, he has been feeling lightheaded,  especially when going from a lying to seated or seated to standing  position.  He denies any syncope or presyncope and also denies any  palpitations, chest pain.  He was seen in [facility] today for his  lightheadedness, and an ECG was performed suggesting inferior Qs as well  as lateral ST-segment flattening, and he was sent to the ED for further  evaluation.  He was mildly orthostatic going from lying to sitting at  [facility], but by  the time he was standing his pressure had normalized.  Here he offers no complaints.   HOME MEDICATIONS:  1. Aspirin 81 mg daily.  2. Flomax 0.4 mg daily.  3. Lisinopril/HCTZ 20/12.5 mg daily.  4. Prilosec 20 mg daily.  5. Avodart 0.5 mg daily.   FAMILY HISTORY:  Mother died of CAD at age 48.  Father died of CAD at  age 85.  He has 4 half brothers and 3 sisters.  There is no CAD in his  siblings.   SOCIAL HISTORY:  He lives in [place] with his wife, he is retired.  He has a 50-to-60-pack-a-year history of tobacco abuse, quitting about  25 years ago.  He denies any alcohol or drugs.  He does not exercise but  remains very active without significant limitations.   REVIEW OF SYSTEMS:  Positive for difficulty starting his stream and  keeps straining.  Over the past couple of days, he has had  lightheadedness and weakness.  Otherwise, all systems reviewed and  negative.   PHYSICAL EXAM:  VITAL SIGNS:  Temperature 97.2, heart rate 70,  respirations 18, blood pressure 141/68, pulse ox 96% on room air.  GENERAL:  Pleasant Native-American male in no acute distress, awake,  alert and oriented x3.  NECK:  No bruits or JVD.  LUNGS:  Respirations regular and unlabored, clear to auscultation.  CARDIAC:  Regular S1, S2, no S3, S4, no murmurs.  ABDOMEN:  Round, soft, nontender, nondistended, bowel sounds present x4.  EXTREMITIES:  Warm, dry, pink.  No clubbing, cyanosis, or edema.  Dorsalis pedis and posterior tibial pulses 1+ and equal bilaterally.  HEENT:  Normal.  NEURO:  Grossly intact and nonfocal.   Chest x-ray shows mild cardiomegaly.  EKG shows sinus rhythm with a  normal axis, a rate of 69 beats per minute, minimal upsloping ST  depression in lead II and V5 and ST flattening in V6.  ECG from [facility] shows Qs in lead III and aVF with similar ST-segment changes  laterally.  Hemoglobin 14.3, hematocrit 42.6, WBC 7.7, platelets 189.  Sodium 140, potassium 3.8, chloride 107, CO2  of 28, BUN 22, creatinine  1.47, glucose 93.  AST 21, ALT 16.  CK 252, MB 4.9, index 1.9, troponin-  I 0.02.  Total protein 6.6, albumin 3.7, PTT 38, PT 14.0, INR 1.1,  calcium is 8.8, magnesium is 2.3.   ASSESSMENT AND PLAN:  1. Lightheadedness/mild orthostasis.  We will recheck orthostatics      here.  I suspect he is dehydrated given his recent activities in      the outdoor heat.  Plan to admit and hydrate and check a 2-D      echocardiogram to rule out wall motion abnormalities.  Provided      that cardiac enzymes are negative and echocardiogram is within      normal limits, we will plan to discharge tomorrow with      consideration towards an outpatient Myoview.  2. Abnormal electrocardiogram, suspect lead misplacement on the      electrocardiogram performed at [facility].  He has got no      significant Qs here.  As above, we will rule out and check an      echocardiogram and likely plan on outpatient Myoview.  3. Hypertension.  Blood pressure is currently elevated.  Because of      his suspected dehydration, we will hold his hydrochlorothiazide and      hydrate today.  4. Benign prostatic hypertrophy.  Continue home medications.  5. Gastroesophageal reflux disease/Barrett's esophagitis.  Continue      proton pump inhibitor.  6. Lipid status unknown, check lipids.  Liver function tests okay.      Nicolasa Ducking, ANP    ______________________________  Doug Sou, M.D.    CB/MEDQ  D:  11/18/2006  T:  11/18/2006  Job:  [id number]

## 2010-10-27 NOTE — Discharge Summary (Signed)
NAME:  Jacob [name], Jacob [name]                ACCOUNT NO.:  [id number]   MEDICAL RECORD NO.:  [id number]          PATIENT TYPE:  OBV   LOCATION:  3731                         FACILITY:  [facility]   PHYSICIAN:  Jacob C. Jacob Emms, MD, FACCDATE OF BIRTH:  Apr 05, 1932   DATE OF ADMISSION:  11/18/2006  DATE OF DISCHARGE:  11/21/2006                               DISCHARGE SUMMARY   PRIMARY CARDIOLOGIST:  Dr. Eden [name]   PRIMARY CARE Jacob [name]:  Patient seen at [facility]   DISCHARGE DIAGNOSIS:  Presyncope in the setting of dehydration.   SECONDARY DIAGNOSES:  1. Coronary artery disease.  2. Dilated cardiomyopathy, EF of 25% with inferior akinesis and global      hypokinesis by left ventriculography.  3. History of Barett's esophagus/gastroesophageal reflux disease.  4. History of colon polyps.  5. History of nephrolithiasis in the 1980's.  6. Hypertension diagnosed 20 years ago.  7. Benign prostatic hypertrophy.   ALLERGIES:  No known drug allergies.   PROCEDURES:  Left heart cardiac catheterization and 2-D echocardiogram.   HISTORY OF PRESENT ILLNESS:  A 77 year old Native-American male with no  prior history of CAD who is very active at home without limitations in  activities.  For several days prior to admission, he had been working  outside in near 100-degree heat doing a lot of gardening and building a  greenhouse with probable inadequate hydration.  He has been noting  lightheadedness as well as orthostasis and was seen at urgent care where  ECG showed inferior Qs with lateral ST flattening.  He was sent to the  emergency room.  We admitted him for further evaluation.   HOSPITAL COURSE:  Patient ruled out for MI and he was noted to have an  elevated creatinine of 1.47 on admission.  He was hydrated with normal  saline with normalization of his creatinine to 1.10 with a BUN of 18.  Given his ECG changes, a 2-D echocardiogram was performed and this  showed an EF of 30% with global  hypokinesis and akinesis of the  inferior, posterior and inferoseptal walls.  Because of this surprising  finding, he was scheduled for cardiac catheterization which was  performed earlier today, March 9, revealing a 90% distal stenosis in the  RCA with left to right collaterals and otherwise nonobstructive coronary  disease.  The EF was 25% with inferior akinesis and global hypokinesis.  Decision was made to medically manage this gentleman at this time, as he  is otherwise asymptomatic.  Low-dose Coreg therapy has been initiated  and he has been maintained on his ACE inhibitor.  [name] will be  discharged home today in satisfactory condition.   DISCHARGE LABS:  Hemoglobin 14.3, hematocrit 42.6, WBC 7.7, platelets  189.  PT 14.0, INR 1.1, PTT 35.  Sodium 142, potassium 3.7, chloride  108, CO2 26, BUN 18, creatinine 1.0, glucose 94.  Total bilirubin 0.8,  alkaline phosphatase 88, AST 21, ALT 16.  Total protein 6.6, albumin  3.7, calcium 8.4, magnesium 2.3.  CK 200, MB 3.4, troponin I 0.02.  Total cholesterol 162, triglycerides 190, HDL 21,  LDL 103.  TSH 1.070.   DISPOSITION:  The patient is being discharged home today in good  condition.   FOLLOWUP PLANS AND APPOINTMENTS:  We have arranged for followup with Dr.  Fabio Bering PA or nurse practitioner on June 23 at 3:15 p.m.   DISCHARGE MEDICATIONS:  1. Coreg 3.125 mg b.i.d.  2. Lisinopril HCTZ 20/12.5 mg q.d.  3. Aspirin 81 mg q.d.  4. Flomax 0.4 mg q.d.  5. Prilosec 20 mg q.d.  6. Avodart 0.5 mg q.d.  7. Nitroglycerin 0.4 mg sublingual p.r.n. chest pain.  8. Simvstatin 40 mg q.h.s.   OUTSTANDING LABS/STUDIES:  None.   Duration of discharge encounter 35 minutes including physician time.      Jacob [name], ANP      Jacob [name]. Jacob Emms, MD, [facility]  Electronically Signed    CB/MEDQ  D:  11/21/2006  T:  11/21/2006  Job:  [id number]   cc:   Jacob [name]

## 2010-10-27 NOTE — Assessment & Plan Note (Signed)
[facility]                            CARDIOLOGY OFFICE NOTE   KACEY, VICUNA                       MRN:          [id number]  DATE:12/14/2007                            DOB:          [birth year]    [name] returns today for followup.  He has a history of  cardiomyopathy, he is functional class I.  He had an AICD placed last  year.  It was checked recently and is functioning normally.  He has not  had any discharges   CF is in the 25-30% range.   He has an occluded right coronary artery with collateral.  He is not  having significant chest pain.  He does have some heat intolerance and  dyspnea, but he still functional class I.  He has been compliant with  his meds.  He had some postural symptoms, but no frank syncope.  I think  the biggest issue with him is when he is outside and he goes for  kneeling to standing.  I tried to explained to him that this would be  difficult to treat.  He certainly not hypotensive on his medications.  He understands the importance of changing positions slowly.  Review of  systems, otherwise, negative.  His prostate seems to be doing better on  Flomax.   CURRENT MEDICATIONS:  1. Coreg 3.125 mg a day.  2. Aspirin a day.  3. Flomax 0.4 mg a day.  4. Avodart 0.5 mg a day.  5. Simvastatin 40 mg a day.  6. Isordil 15 mg a day.  7. Zegerid 40 mg a day.  8. Hyzaar 50/12.5 mg.   ALLERGIES:  He is allergic to PENICILLIN about 3 months ago.  He forgot  this and had some penicillin for sore throat and had a fairly  significant reaction requiring an ER visit.   PHYSICAL EXAMINATION:  GENERAL:  Remarkable for an elderly white male in  no distress.  VITAL SIGNS:  Blood pressure 140/80, is not postural, pulse 80 with  occasional PVCs.  Weight is 230/74, and afebrile.  HEENT:  Unremarkable.  NECK:  Carotids are normal without bruit.  No lymphadenopathy,  thyromegaly, or JVP elevation.  LUNGS:  Clear with good  diaphragmatic motion.  No wheezing.  AICD under  left clavicle somewhat prominent and S1-S2.  Normal heart sounds.  PMI  increased.  ABDOMEN:  Benign.  Bowel sounds are positive.  No AAA, no tenderness, no  bruit, no hepatosplenomegaly, and no hepatojugular reflux.  EXTREMITIES:  Distal pulses are intact.  No edema.  NEURO:  Nonfocal.  SKIN:  Warm and dry.  No muscular weakness.   IMPRESSION:  1. Ischemic cardiomyopathy currently stable functional class I.      Continue current dose of medications, appears euvolemic.  2. History of automatic implantable cardioverter-defibrillator,      followup with [facility] in 3 months.  No discharges.  3. Prostatism seems to be improving, followup primary care MD, and PSA      in 6 months.  Continue Flomax and Avodart.  4. Hypertension, currently well controlled.  No evidence of low blood      pressures.  Continue current dose of Hyzaar and low-salt diet.  5. Hypercholesteremia in the setting of coronary disease.  Continue      simvastatin.  Lipid and liver profile in 6 months.   His electrocardiogram today showed sinus rhythm with probably an old and  inferior wall myocardial infarction and premature ventricular  contractions.  No acute changes.  He had intermittent pacing.     Noralyn Pick. Eden Emms, MD, [facility]  Electronically Signed    PCN/MedQ  DD: 12/14/2007  DT: 12/15/2007  Job #: [id number]

## 2010-10-27 NOTE — Assessment & Plan Note (Signed)
[facility]                         ELECTROPHYSIOLOGY OFFICE NOTE   LAVOY, BERNARDS                       MRN:          [id number]  DATE:12/19/2006                            DOB:          Nov 04, 1931    Mr. Neilan is referred today by Dr. Charlton Haws for evaluation and  consideration for ICD implantation.  The patient is a very pleasant  77-  year-old man who was admitted to the hospital after having an abnormal  EKG and subsequent echocardiogram demonstrating severe LV dysfunction,  EF of 25% to 30%.  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  Otherwise, he had global hypokinesis.  The  patient states that, over the last several months, his fatigue and  weakness have increased, though he did not actually feel having an MI or  having other symptoms.  He has never had frank syncope.  He does have  palpitations which are poorly characterized.  He denies chest pain with  these.  He has dyspnea with exertion, and his heart failure is basically  class 2 by his report.   PAST MEDICAL HISTORY:  His past medical history is notable for  hypertension, prostate problems, dyslipidemia.   MEDICATIONS:  Include:  1. Coreg 3.125 twice daily.  2. Lisinopril/HCTZ 20/12.5 daily.  3. Aspirin 81 a day.  4. Flomax 0.4 a day.  5. Avodart 0.5 a day.  6. Simvastatin 40 daily.   ADDITIONAL PAST MEDICAL HISTORY:  The patient's additional past medical  history is notable for Barrett's esophagus and a history of kidney  stones.   SOCIAL HISTORY:  The patient denies tobacco or ethanol abuse at present.  He is married.   FAMILY HISTORY:  Noncontributory.   REVIEW OF SYSTEMS:  Notable for some dysuria.  He has minimal dyspnea  with exertion and fatigue.  Otherwise, his review of systems were as  noted in the HPI and otherwise negative on physical examination.   He is  a pleasant, well-appearing man in no acute distress.  The blood pressure  was 139/60, the pulse was 50 and regular.  His respirations were 18.  The weight was 200 pounds.  HEENT:  Normocephalic, atraumatic.  Pupils  equal, round.  Oropharynx is moist, sclerae anicteric.  NECK:  Revealed  no jugular venous distention and no thyromegaly.  Trachea was midline.  The carotids are 2+ and symmetric.  LUNGS:  Clear bilaterally to  auscultation.  No wheezes, rales or rhonchi are present.  CARDIOVASCULAR:  Exam revealed a regular rate and rhythm and normal S1,  S2.  EXTREMITIES:  Demonstrate no edema.  NEUROLOGIC:  Alert and  oriented x2, his cranial nerves intact.  The strength is 5/5 and  symmetric.   EKG demonstrates sinus rhythm, frequent PVCs and poor R-wave  progression.   IMPRESSION:  1. Ischemic cardiomyopathy.  2. Congestive heart failure class 2.  3. Frequent and dense ventricular ectopy.   DISCUSSION:  I have discussed treatment options with the patient and his  wife in detail.  The risks, benefits, goals and expectations of ICD  implantation have been discussed with the patient.  He would like to  proceed.  This will be scheduled early as possible for a convenient  time.     Doylene Canning. Ladona Ridgel, MD  Electronically Signed    GWT/MedQ  DD: 12/19/2006  DT: 12/19/2006  Job #: [id number]   cc:   Noralyn Pick. Eden Emms, MD, [facility]  [facility]

## 2010-10-27 NOTE — Cardiovascular Report (Signed)
NAMEJAMONT, [name]                ACCOUNT NO.:  [id number]   MEDICAL RECORD NO.:  [id number]          PATIENT TYPE:  OBV   LOCATION:  3731                         FACILITY:  [facility]   PHYSICIAN:  Salvadore Farber, MD  DATE OF BIRTH:  1932/05/05   DATE OF PROCEDURE:  11/22/2006  DATE OF DISCHARGE:                            CARDIAC CATHETERIZATION   PROCEDURE:  Left heart catheterization, left ventriculography, coronary  angiography, StarClose closure of the right common femoral arteriotomy  site and abdominal aortography.   INDICATION:  [name] is a 77 year old gentleman who presented with  presyncope which was felt to be due to orthostasis after working in the  heat.  However, an echocardiogram was done which showed an ejection  fraction of 30% with inferior akinesis and hypokinesis elsewhere.  He  was, therefore, referred for diagnostic angiography to assess for  potential ischemic etiology to his cardiomyopathy.   PROCEDURAL TECHNIQUE:  Informed consent was obtained.  Under 1%  lidocaine local anesthesia, a 5-French sheath was placed in the right  common femoral artery using the modified Seldinger technique.  Diagnostic angiography and ventriculography were performed using JL-4,  JR-4, and pigtail catheters.  The pigtail catheter was then pulled back  to the suprarenal abdominal aorta.  Abdominal aortography was performed  by power injection.  The atriotomy was then closed using a StarClose  device.  Complete hemostasis was obtained.  He was transferred to the  holding room in stable condition.   COMPLICATIONS:  None.   FINDINGS:  1. LV:  170/16/23.  Ejection fraction 25% with inferior akinesis and      global hypokinesis.  2. No aortic stenosis or mitral regurgitation.  3. Left main:  Angiographically normal.  4. Left anterior descending:  Moderate-sized vessel giving rise to a      single diagonal.  There are only minor luminal irregularities.  5. Circumflex:  Small  vessel giving rise to no significant marginals.      It is normal.  6. Ramus intermedius:  A large branching vessel.  It is      angiographically normal.  7. Right coronary artery:  A moderate-sized dominant vessel.  Sixty      percent stenosis in the mid section and then at least 90% stenosis      more distally.  There is some TIMI I flow through.  However, there      are fairly robust left-to-right collaterals.   IMPRESSION/PLAN:  The patient has an old inferior myocardial infarction  now with ischemic cardiomyopathy.  He has no angina.  Will therefore  manage the coronary disease medically.  Will add a beta blocker and  continue his ACE inhibitor.      Salvadore Farber, MD  Electronically Signed     WED/MEDQ  D:  11/21/2006  T:  11/21/2006  Job:  [id number]   cc:   [facility]  Noralyn Pick. Eden Emms, MD, [facility]

## 2010-10-27 NOTE — Discharge Summary (Signed)
NAME:  Jacob [name], Jacob [name]                ACCOUNT NO.:  [id number]   MEDICAL RECORD NO.:  [id number]          PATIENT TYPE:  INP   LOCATION:  2020                         FACILITY:  [facility]   PHYSICIAN:  Noralyn Pick. Eden Emms, MD, FACCDATE OF BIRTH:  08-Apr-1932   DATE OF ADMISSION:  12/17/2008  DATE OF DISCHARGE:  12/19/2008                               DISCHARGE SUMMARY   DISCHARGE DIAGNOSES:  1. Chest pain.  The patient ruled out for myocardial infarction by      serial cardiac enzymes, 12-lead and cardiac catheterization which      showed single vessel coronary artery disease with a total right      coronary artery that is collateralized.  No change in coronary      anatomy since 2005.  Chest pain felt not to be cardiac in origin,      questionable whether it may be secondary to      gastrointestinal/Barrett esophagus as the patient has a history.      The patient will follow up with Dr. Sabino Gasser outpatient on December 23, 2008, for further evaluation.  2. Hypokalemia, supplemented with p.o. potassium this admission.   Past medical history includes:  1. Coronary artery disease as stated above.  2. Ischemic cardiomyopathy status post ICD implant followed by Dr.      Ladona Ridgel as well as a history of congestive heart failure.  3. Prostatism.  4. Hypercholesteremia.  5. GERD/Barrett Esophagus.  6. Hypertension.   HOSPITAL COURSE:  Jacob [name] is a 77 year old gentleman with past medical  history as stated above who was seen in our office on December 13, 2008,  complaining of increased symptoms suggestive of angina.  Plans were made  to have an outpatient JV catheterization.  However, the patient had  prolonged episode of chest discomfort with some nausea, and he  ultimately came to the ER for further evaluation.  Because of his  symptoms. It was felt he wanted a cardiac catheterization for  reevaluation.  The patient was admitted.  Anticoagulated started on  heparin.  Continued home medications.   However, because of soft blood  pressure a hole was placed on some of his medicines to titrate his  nitroglycerin.  He was mildly hypokalemia, supplemented with p.o.  potassium with improvement.  He was taken to the cath lab on December 18, 2008, by Dr. Charlton Haws, results as stated above.  No change in  coronary anatomy at this time.  The patient's chest pain felt to be  noncardiac in origin.  Adjustments were made in the patient's  medication.  His Hyzaar/HCT was discontinued.  The patient was started  on lisinopril 20 mg daily along with KCl 20 mEq daily and Lasix 10 mg  daily.   He is to continue previous medications including his:  1. Coreg 3.125 b.i.d.  2. Flomax 0.4 mg daily.  3. Avodart 0.5 daily.  4. Simvastatin 40 daily.  5. Isosorbide mononitrate 30 daily.  6. Zegerid daily.  7. Fluticasone nasal spray daily.  8. Aspirin 881 mg daily.  The patient has been given the postcardiac catheterization discharge  instructions.  He will follow up with a BMET on January 23, 2009, at  10:00 a.m., Dr. Eden Emms on March 19, 2009, at 9:00 a.m., and Dr. Virginia Rochester on  December 23, 2008, at 4:00 p.m.   DURATION OF DISCHARGE ENCOUNTER:  Well over 30 minutes.      Dorian Pod, ACNP      Noralyn Pick. Eden Emms, MD, [facility]  Electronically Signed    MB/MEDQ  D:  12/19/2008  T:  12/19/2008  Job:  [id number]   cc:   Georgiana Spinner, M.D.

## 2010-10-30 NOTE — Op Note (Signed)
Jacob [name], Jacob [name]                ACCOUNT NO.:  [id number]   MEDICAL RECORD NO.:  [id number]          PATIENT TYPE:  AMB   LOCATION:  ENDO                         FACILITY:  [facility]   PHYSICIAN:  Georgiana Spinner, M.D.    DATE OF BIRTH:  1932/04/01   DATE OF PROCEDURE:  08/24/2005  DATE OF DISCHARGE:                                 OPERATIVE REPORT   PROCEDURE:  Upper endoscopy with dilation.   INDICATIONS:  Dysphagia.   ANESTHESIA:  Demerol 50 Versed 5 mg.   DESCRIPTION OF PROCEDURE:  With the patient mildly sedated in the left  lateral decubitus position, the Olympus videoscopic endoscope was inserted  in the mouth, passed under direct vision through the esophagus, into the  stomach fundus, body, antrum, duodenal bulb, second portion of the duodenum  were visualized. From this point, the endoscope was slowly withdrawn taking  circumferential views of duodenal mucosa until the endoscope was pulled back  into the stomach, placed in retroflexion to view the stomach from below. The  endoscope was then straightened and a guidewire was passed. The endoscope  was withdrawn. Then 15 and 17 Savary dilators were passed over the  guidewire; this was after a biopsy had been taken of the stomach. With the  17 dilator been removed; the guidewire was removed as well. The endoscope  was reinserted endoscope was reinserted.  The endoscope was withdrawn. The  patient's vital signs and pulse oximeter remained stable. The patient  tolerated the procedure well without apparent complications.   FINDINGS:  Erythematous changes of stomach biopsied, dilation of distal  esophagus to 15 and 17 Savary; await clinical response and biopsy report.  The patient will call me for results and follow up with me as an outpatient.           ______________________________  Georgiana Spinner, M.D.     GMO/MEDQ  D:  08/24/2005  T:  08/25/2005  Job:  [id number]

## 2010-10-30 NOTE — Op Note (Signed)
   TNAMEMOHSIN, CRUM                         ACCOUNT NO.:  [id number]   MEDICAL RECORD NO.:  [id number]                   PATIENT TYPE:  AMB   LOCATION:  ENDO                                 FACILITY:  [facility]   PHYSICIAN:  Georgiana Spinner, M.D.                 DATE OF BIRTH:  Aug 20, 1931   DATE OF PROCEDURE:  01/31/2002  DATE OF DISCHARGE:                                 OPERATIVE REPORT   PROCEDURE:  Upper endoscopy with biopsy.   INDICATIONS:  Biopsy-proven Barrett's esophagus.   ANESTHESIA:  Demerol 60, Versed 6 mg.   DESCRIPTION OF PROCEDURE:  With patient mildly sedated in the left lateral  decubitus position, the Olympus videoscopic endoscope inserted in the mouth  and passed under direct vision through the esophagus, which appeared normal,  until we reached the distal esophagus, and there a question of wisp of  Barrett's esophagus, biopsied.  We entered into the stomach.  Fundus, body,  antrum, duodenal bulb, and second portion of duodenum all appeared normal.  From this point, the endoscope was slowly withdrawn, taking circumferential  views of the entire duodenal mucosa until the endoscope then pulled into the  stomach, placed in retroflexion to view stomach from below.  The endoscope  was straightened and withdrawn, taking circumferential views of the  remaining gastric and esophageal mucosa. The patient's vital signs and pulse  oximeter remained stable.  The patient tolerated the procedure well without  apparent complications.   FINDINGS:  Changes of Barrett's esophagus in one small area., biopsied.   PLAN:  1. Await biopsy report.  2. The patient will call me for results and follow up with me as an     outpatient.                                               Georgiana Spinner, M.D.    GMO/MEDQ  D:  01/31/2002  T:  01/31/2002  Job:  [id number]

## 2010-10-30 NOTE — Op Note (Signed)
NAME:  ILYA, Jacob [name]                          ACCOUNT NO.:  [id number]   MEDICAL RECORD NO.:  [id number]                   PATIENT TYPE:  AMB   LOCATION:  ENDO                                 FACILITY:  [facility]   PHYSICIAN:  Georgiana Spinner, M.D.                 DATE OF BIRTH:  April 25, 1932   DATE OF PROCEDURE:  DATE OF DISCHARGE:                                 OPERATIVE REPORT   PROCEDURE:  Upper endoscopy with dilation and biopsy.   INDICATIONS:  Dysphagia.   ANESTHESIA:  Demerol 60, Versed 6 mg.   PROCEDURE:  With the patient mildly sedated in the left lateral decubitus  position, the Olympus videoscopic endoscope was inserted into the mouth,  passed under direct vision into the esophagus, which appeared normal except  for a question of Barrett's distally.  A stricture was clearly seen and  photographed.  We entered into the stomach.  The fundus, body, antrum,  duodenal bulb, and second portion of the duodenum were visualized.  From  this point, the endoscope was slowly withdrawn, taking circumferential views  of the duodenal mucosa until the endoscope was pulled back into the stomach.  Placed in retroflexion, we viewed the stomach from below.  The endoscope was  straightened and a guidewire was passed.  The endoscope was withdrawn with  fluoroscopic control.  We passed Savary dilators, 15 and 18 mm, without  significant resistance.  There was no blood seen on either dilator.  With  the latter, the guidewire was removed.  The endoscope was reinserted  distally, and biopsies were taken of the distal esophagus.  The endoscope  was withdrawn.  Patient's vital signs and pulse oximeter remained stable.  The patient tolerated the procedure well without apparent complications.   FINDINGS:  Distal esophageal stricture, dilated to 15 and 18 Savary  dilation.   PLAN:  Await clinical response and results of biopsy.  Patient will call me  for results and follow up with me as an  outpatient.                                               Georgiana Spinner, M.D.    GMO/MEDQ  D:  02/07/2004  T:  02/07/2004  Job:  [id number]   cc:   Jonita Albee, M.D.  [facility]  [address]  [place]  [place] [address]  Fax: [phone]

## 2010-10-30 NOTE — Op Note (Signed)
NAMEROSALIE, GELPI NO.:  [id number]   MEDICAL RECORD NO.:  [id number]          PATIENT TYPE:  EMS   LOCATION:  MAJO                         FACILITY:  [facility]   PHYSICIAN:  Georgiana Spinner, M.D.    DATE OF BIRTH:  Aug 29, 1931   DATE OF PROCEDURE:  08/03/2005  DATE OF DISCHARGE:  08/04/2005                                 OPERATIVE REPORT   PROCEDURE:  Upper endoscopy with foreign body removal.   INDICATIONS FOR PROCEDURE:  Foreign body obstruction in the esophagus.   __________   PROCEDURE:  With the patient mildly sedated __________ duodenal bulb was  visualized.  __________ __________.  From this point, the endoscope was  slowly withdrawn taking circumferential views of the duodenal mucosa  __________ back into the stomach, placed in retroflexion __________.  The  patient tolerated the procedure well without apparent complications.   __________   PLAN:  Will place the patient on __________ two weeks, at which we will plan  __________ esophageal dilation.           ______________________________  Georgiana Spinner, M.D.     GMO/MEDQ  D:  08/04/2005  T:  08/04/2005  Job:  [id number]

## 2010-10-30 NOTE — Procedures (Signed)
[facility]  Patient:    Jacob [name], Jacob [name]                       MRN: [id number] Proc. Date: 12/22/00 Adm. Date:  [id number] Attending:  Sabino Gasser                           Procedure Report  PROCEDURE:  Upper endoscopy.  SURGEON:  Sabino Gasser, M.D.  INDICATIONS:  Barretts esophagus, seen previously on biopsy.  ANESTHESIA:  Demerol 60 and Versed 6 mg.  DESCRIPTION OF PROCEDURE:  With the patient mildly sedated in the left lateral decubitus position, the Olympus videoscopic endoscope was inserted in the mouth and passed under direct vision through the esophagus and there appeared to be small areas of Barretts esophagus photographed.  It was difficult, the esophagus would not open up well, but we were able to see that there appeared to be some areas of islands of Barretts, and we attempted to photograph that and subsequently attempted to biopsy as well.  Once accomplished, the endoscope was advanced to the stomach.  The fundus, body, antrum, duodenal bulb, and second portion of the duodenum were all well-visualized.  From this point, the endoscope was slowly withdrawn, taking circumferential views of the entire duodenal mucosa until the endoscope had been pulled back into the stomach, and placed on retroflexion to view the stomach from below.  The endoscope was then straightened and withdrawn, taking circumferential views of the remaining gastric and esophageal mucosa.  The patients vital signs and pulse oximeter remained stable.  The patient tolerated the procedure well and without apparent complications.  FINDINGS:  Question of continued Barretts esophagus, photographed, and biopsies attempted.  PLAN:  Await biopsy report.  The patient will call me for results and follow up with me as an outpatient. DD:  12/22/00 TD:  12/22/00 Job: [id number]

## 2010-10-30 NOTE — Op Note (Signed)
NAMEMARRIO, SCRIBNER                ACCOUNT NO.:  [id number]   MEDICAL RECORD NO.:  [id number]          PATIENT TYPE:  AMB   LOCATION:  ENDO                         [facility]ACILITY:  [facility]   PHYSICIAN:  Georgiana Spinner, M.D.    DATE OF BIRTH:  02/07/1932   DATE OF PROCEDURE:  11/23/2004  DATE OF DISCHARGE:                                 OPERATIVE REPORT   PROCEDURE:  Colonoscopy.   INDICATIONS:  Colon polyps.   ANESTHESIA:  Demerol 80 mg, Versed 7 mg.   DESCRIPTION OF PROCEDURE:  With the patient mildly sedated in the left  lateral decubitus position, a rectal exam was performed which was  unremarkable. Subsequently the Olympus videoscopic colonoscope was inserted  in the rectum and passed under direct vision to the cecum identified by the  ileocecal valve and appendiceal orifice both of which were photographed.  From this point, the colonoscope was slowly withdrawn taking circumferential  views of the colonic mucosa stopping only in the rectum which appeared  normal on direct and retroflexed view. The endoscope was straightened and  withdrawn. The patient's vital signs and pulse oximeter remained stable. The  patient tolerated the procedure well without apparent complications.   FINDINGS:  Diverticulosis of the sigmoid colon, photographed, otherwise an  unremarkable examination.   PLAN:  Have the patient follow-up with me in five years or as needed.       GMO/MEDQ  D:  11/23/2004  T:  11/23/2004  Job:  [id number]

## 2010-11-02 ENCOUNTER — Other Ambulatory Visit (INDEPENDENT_AMBULATORY_CARE_PROVIDER_SITE_OTHER): Payer: Medicare Other | Admitting: *Deleted

## 2010-11-02 DIAGNOSIS — I509 Heart failure, unspecified: Secondary | ICD-10-CM

## 2010-11-02 DIAGNOSIS — Z01810 Encounter for preprocedural cardiovascular examination: Secondary | ICD-10-CM

## 2010-11-02 LAB — BASIC METABOLIC PANEL
CO2: 29 mEq/L (ref 19–32)
Calcium: 8.8 mg/dL (ref 8.4–10.5)
Creatinine, Ser: 1.7 mg/dL — ABNORMAL HIGH (ref 0.4–1.5)
Glucose, Bld: 77 mg/dL (ref 70–99)
Sodium: 137 mEq/L (ref 135–145)

## 2010-11-02 LAB — CBC WITH DIFFERENTIAL/PLATELET
Basophils Absolute: 0 10*3/uL (ref 0.0–0.1)
Eosinophils Absolute: 0.2 10*3/uL (ref 0.0–0.7)
Hemoglobin: 12.9 g/dL — ABNORMAL LOW (ref 13.0–17.0)
Lymphocytes Relative: 22.2 % (ref 12.0–46.0)
MCHC: 34.3 g/dL (ref 30.0–36.0)
MCV: 92.4 fl (ref 78.0–100.0)
Monocytes Absolute: 0.7 10*3/uL (ref 0.1–1.0)
Neutro Abs: 6.1 10*3/uL (ref 1.4–7.7)
RDW: 14 % (ref 11.5–14.6)

## 2010-11-05 ENCOUNTER — Other Ambulatory Visit: Payer: Self-pay | Admitting: Internal Medicine

## 2010-11-05 ENCOUNTER — Inpatient Hospital Stay (HOSPITAL_BASED_OUTPATIENT_CLINIC_OR_DEPARTMENT_OTHER)
Admission: RE | Admit: 2010-11-05 | Discharge: 2010-11-05 | Disposition: A | Discharge status: Home / Self Care | Payer: Medicare Other | Source: Ambulatory Visit | Attending: Cardiovascular Disease | Admitting: Cardiovascular Disease

## 2010-11-05 ENCOUNTER — Ambulatory Visit (INDEPENDENT_AMBULATORY_CARE_PROVIDER_SITE_OTHER): Payer: Medicare Other | Admitting: *Deleted

## 2010-11-05 DIAGNOSIS — I2510 Atherosclerotic heart disease of native coronary artery without angina pectoris: Secondary | ICD-10-CM | POA: Insufficient documentation

## 2010-11-05 DIAGNOSIS — R0602 Shortness of breath: Secondary | ICD-10-CM | POA: Insufficient documentation

## 2010-11-05 DIAGNOSIS — I2589 Other forms of chronic ischemic heart disease: Secondary | ICD-10-CM | POA: Insufficient documentation

## 2010-11-05 DIAGNOSIS — I509 Heart failure, unspecified: Secondary | ICD-10-CM

## 2010-11-05 DIAGNOSIS — I428 Other cardiomyopathies: Secondary | ICD-10-CM

## 2010-11-05 DIAGNOSIS — Z95810 Presence of automatic (implantable) cardiac defibrillator: Secondary | ICD-10-CM

## 2010-11-05 DIAGNOSIS — I2582 Chronic total occlusion of coronary artery: Secondary | ICD-10-CM | POA: Insufficient documentation

## 2010-11-05 HISTORY — PX: CARDIAC CATHETERIZATION: SHX172

## 2010-11-05 LAB — POCT I-STAT 3, VENOUS BLOOD GAS (G3P V)
TCO2: 28 mmol/L (ref 0–100)
pCO2, Ven: 46.4 mmHg (ref 45.0–50.0)
pH, Ven: 7.365 — ABNORMAL HIGH (ref 7.250–7.300)

## 2010-11-05 LAB — POCT I-STAT 3, ART BLOOD GAS (G3+)
O2 Saturation: 95 %
TCO2: 26 mmol/L (ref 0–100)
pH, Arterial: 7.402 (ref 7.350–7.450)

## 2010-11-10 ENCOUNTER — Other Ambulatory Visit (INDEPENDENT_AMBULATORY_CARE_PROVIDER_SITE_OTHER): Payer: Medicare Other | Admitting: *Deleted

## 2010-11-10 DIAGNOSIS — N289 Disorder of kidney and ureter, unspecified: Secondary | ICD-10-CM

## 2010-11-10 LAB — BASIC METABOLIC PANEL
CO2: 27 mEq/L (ref 19–32)
Chloride: 107 mEq/L (ref 96–112)
Potassium: 3.6 mEq/L (ref 3.5–5.1)
Sodium: 143 mEq/L (ref 135–145)

## 2010-11-11 NOTE — Cardiovascular Report (Signed)
  Jacob [name], Jacob [name]                ACCOUNT NO.:  [id number]  MEDICAL RECORD NO.:  [id number]            PATIENT TYPE:  LOCATION:                                 FACILITY:  PHYSICIAN:  Peter C. Eden Emms, MD, [facility]   DATE OF BIRTH:  DATE OF PROCEDURE: DATE OF DISCHARGE:                           CARDIAC CATHETERIZATION   INDICATIONS:  Preop left knee replacement, history of ischemic cardiomyopathy.  The patient has congestive heart failure, shortness of breath.  Study was done to clear the patient for surgery.  Cine catheterization was done with a 4-French right femoral artery catheter, and a 7-French the right femoral venous sheath.  Right heart catheterization showed good pressures.  Mean right atrial pressure was 3, RV pressure was 18/7.  PA pressure was 15/8.  Mean pulmonary capillary wedge pressure was only 3.  LV pressure was 100/10. Aortic pressure was 100/48.  Cardiac output was 4.6 liters per minute with a cardiac index of 2.2 liters per minute per meter squared.  Aortic sat was 95%, PA sat was 62%.  CORONARY ARTERIOGRAPHY:  Left main coronary artery was normal.  The left anterior descending artery was a large vessel.  There was calcification of the proximal portion, but no significant stenosis.  The midvessel had 20-30% disease at the distal vessels, had a 40% eccentric lesion.  The first diagonal branch was large with 40% midvessel disease.  The circumflex coronary artery was nondominant.  There is a very large branching obtuse marginal branch with a 20-30% midvessel disease, CAD groove branch was normal.  There were left-to-right collaterals to the distal RCA.  The right coronary artery was 100% occluded distally, which was known from previous cath in 2010 and is chronic.  The patient has known decrease in LV function with an EF of 25-30% by echo because his creatinine was 1.7.  We elected only to cross the valve and did not shoot an LV gram.  IMPRESSION:   The patient had stable hemodynamics with low filling pressures.  His coronary anatomy is stable with a collateralized total right coronary artery, but no significant disease in his left-sided vessels.  He should be fairly low risk in regard to an ischemic event during knee surgery.  His filling pressures were low, but there still should be judicious use of fluids post knee replacement.  He is cleared to have knee surgery with Dr. Durene Romans.  He tolerated this procedure well.     Noralyn Pick. Eden Emms, MD, [facility]     PCN/MEDQ  D:  11/05/2010  T:  11/06/2010  Job:  [id number]  cc:   Madlyn Frankel Charlann Boxer, M.D.  Electronically Signed by Charlton Haws MD [facility] on 11/11/2010 08:34:11 PM

## 2010-11-25 ENCOUNTER — Encounter: Payer: Self-pay | Admitting: Internal Medicine

## 2010-11-25 ENCOUNTER — Ambulatory Visit (INDEPENDENT_AMBULATORY_CARE_PROVIDER_SITE_OTHER): Payer: Medicare Other | Admitting: Internal Medicine

## 2010-11-25 DIAGNOSIS — I10 Essential (primary) hypertension: Secondary | ICD-10-CM

## 2010-11-25 DIAGNOSIS — M129 Arthropathy, unspecified: Secondary | ICD-10-CM

## 2010-11-25 DIAGNOSIS — I509 Heart failure, unspecified: Secondary | ICD-10-CM

## 2010-11-25 DIAGNOSIS — D239 Other benign neoplasm of skin, unspecified: Secondary | ICD-10-CM

## 2010-11-25 MED ORDER — FLUTICASONE PROPIONATE 50 MCG/ACT NA SUSP: 2.0000 | NASAL | Status: DC | PRN

## 2010-11-25 NOTE — Assessment & Plan Note (Signed)
The current medical regimen is effective;  continue present plan and medications. BP Readings from Last 3 Encounters:  11/25/10 130/62  10/05/10 110/60  08/26/10 102/68

## 2010-11-25 NOTE — Patient Instructions (Signed)
It was good to see you today. We have reviewed your prior records including labs and tests today You have been medically evaluated and it is felt that your surgical risk is low and outweighed by the potential benefit of the surgery. Therefore, medically clear to proceed when scheduling allows. Medications reviewed, no changes at this time. Please schedule followup in 3-4 months, call sooner if problems.

## 2010-11-25 NOTE — Assessment & Plan Note (Signed)
Follows with [place] [facility] - L TKR planned 01/07/11 with Dr. Charlann Boxer.  Cleared by cards following L/R heart cath 11/04/10 This patient has been medically evaluated and it is felt that the surgical risk is low and outweighed by the potential benefit of the surgery. Therefore, medically clear to proceed when scheduling allows.

## 2010-11-25 NOTE — Assessment & Plan Note (Signed)
S/p derm eval 09/2010 - removed with clear margins

## 2010-11-25 NOTE — Progress Notes (Signed)
Subjective:    Patient ID: Jacob [name], male    DOB: [birth year], 77 y.o.   MRN: [id number]  HPI Here for follow up - reviewed chronic med issues:  Diffuse OA - both shoulders, right>left, knees and hips  s/p fall accidental on left shoulder fall 2011-saw ortho for same - had steroid injection with temp relief  Now planning L TKR with ortho - cleared by cards after 11/05/10 cath - future R TKR if L ok  CHF/CAD - follows with [facility] cards for same - [facility] clinic  reports compliance with ongoing medical treatment and no changes in medication dose or frequency.  denies adverse side effects related to current therapy. no cp, SOB or edema -   HTN - reports compliance with ongoing medical treatment and no changes in medication dose or frequency.  denies adverse side effects related to current therapy. no HA or vision changes   dyslipidemia - reports compliance with ongoing medical treatment and no changes in medication dose or frequency.  denies adverse side effects related to current therapy. no GI or muscle c/o   BPH - follows with dr. Isabel Caprice -uro  feels bladder emptying better since last med change   Past Medical History  Diagnosis Date  . CAD (coronary artery disease)   . CHF (congestive heart failure)   . Systolic dysfunction 12/2006    AICD  . HTN (hypertension)   . HLD (hyperlipidemia)   . BPH (benign prostatic hyperplasia)   . Barrett esophagus   . History of diverticulitis of colon   . GERD (gastroesophageal reflux disease)   . History of nephrolithiasis   . DJD (degenerative joint disease) of knee        Review of Systems  Cardiovascular: Negative for chest pain and leg swelling.  Musculoskeletal: Positive for gait problem.  Neurological: Negative for weakness and headaches.       Objective:   Physical Exam BP 130/62  Pulse 77  Temp(Src) 98.1 F (36.7 C) (Oral)  Ht 5\' 10"  (1.778 m)  Wt 204 lb (92.534 kg)  BMI 29.27 kg/m2  SpO2 98%  Physical  Exam  Constitutional:  oriented to person, place, and time. appears well-developed and well-nourished. No distress. Wife at side Neck: Normal range of motion. Neck supple. No JVD present. No thyromegaly present.  Cardiovascular: Normal rate, regular rhythm and normal heart sounds.  No murmur heard. Pulmonary/Chest: Effort normal and breath sounds normal. No respiratory distress. no wheezes.  Abdominal: Soft. Bowel sounds are normal. Patient exhibits no distension. There is no tenderness.  Neurological: he is alert and oriented to person, place, and time. No cranial nerve deficit. Coordination normal.  Skin: Skin is warm and dry.  No erythema or ulceration.  Psychiatric: he has a normal mood and affect. behavior is normal. Judgment and thought content normal.       Lab Results  Component Value Date   WBC 9.0 11/02/2010   HGB 12.9* 11/02/2010   HCT 37.6* 11/02/2010   PLT 141.0* 11/02/2010   CHOL 131 08/26/2010   TRIG 219.0* 08/26/2010   HDL 29.30* 08/26/2010   LDLDIRECT 54.3 08/26/2010   ALT 14 08/26/2010   AST 20 08/26/2010   NA 143 11/10/2010   K 3.6 11/10/2010   CL 107 11/10/2010   CREATININE 1.6* 11/10/2010   BUN 19 11/10/2010   CO2 27 11/10/2010   INR 1.3* 11/02/2010      Assessment & Plan:  See problem list. Medications and labs reviewed today. Time  spent with pt/family today 25 minutes, greater than 50% time spent counseling patient on arthritis, upcoming L TKR 01/04/11, cardiac review including last cath 10/2010 and medication review. Also review of prior labs

## 2010-11-25 NOTE — Assessment & Plan Note (Signed)
Compensated - weight stable - follows with cards - last OV reviewed including cath 11/04/10 The current medical regimen is effective;  continue present plan and medications.

## 2010-12-04 ENCOUNTER — Encounter: Payer: Self-pay | Admitting: *Deleted

## 2010-12-07 ENCOUNTER — Other Ambulatory Visit: Payer: Self-pay | Admitting: Cardiovascular Disease

## 2010-12-17 NOTE — Progress Notes (Signed)
ICD remote 

## 2010-12-21 ENCOUNTER — Telehealth: Payer: Self-pay

## 2010-12-30 ENCOUNTER — Other Ambulatory Visit: Payer: Self-pay | Admitting: Orthopedic Surgery

## 2010-12-30 ENCOUNTER — Other Ambulatory Visit (HOSPITAL_COMMUNITY): Payer: Self-pay | Admitting: Orthopedic Surgery

## 2010-12-30 ENCOUNTER — Ambulatory Visit (HOSPITAL_COMMUNITY)
Admission: RE | Admit: 2010-12-30 | Discharge: 2010-12-30 | Disposition: A | Discharge status: Home / Self Care | Payer: Medicare Other | Source: Ambulatory Visit | Attending: Orthopedic Surgery | Admitting: Orthopedic Surgery

## 2010-12-30 ENCOUNTER — Telehealth: Payer: Self-pay | Admitting: *Deleted

## 2010-12-30 ENCOUNTER — Encounter (HOSPITAL_COMMUNITY): Payer: Medicare Other

## 2010-12-30 DIAGNOSIS — Z01818 Encounter for other preprocedural examination: Secondary | ICD-10-CM

## 2010-12-30 DIAGNOSIS — M479 Spondylosis, unspecified: Secondary | ICD-10-CM | POA: Insufficient documentation

## 2010-12-30 DIAGNOSIS — Z950 Presence of cardiac pacemaker: Secondary | ICD-10-CM | POA: Insufficient documentation

## 2010-12-30 DIAGNOSIS — Z01812 Encounter for preprocedural laboratory examination: Secondary | ICD-10-CM | POA: Insufficient documentation

## 2010-12-30 DIAGNOSIS — M6289 Other specified disorders of muscle: Secondary | ICD-10-CM | POA: Insufficient documentation

## 2010-12-30 DIAGNOSIS — M1710 Unilateral primary osteoarthritis, unspecified knee: Secondary | ICD-10-CM | POA: Insufficient documentation

## 2010-12-30 LAB — DIFFERENTIAL
Basophils Absolute: 0 10*3/uL (ref 0.0–0.1)
Lymphocytes Relative: 27 % (ref 12–46)
Lymphs Abs: 1.8 10*3/uL (ref 0.7–4.0)
Monocytes Absolute: 0.6 10*3/uL (ref 0.1–1.0)
Monocytes Relative: 10 % (ref 3–12)
Neutro Abs: 3.9 10*3/uL (ref 1.7–7.7)

## 2010-12-30 LAB — PROTIME-INR: Prothrombin Time: 15.4 seconds — ABNORMAL HIGH (ref 11.6–15.2)

## 2010-12-30 LAB — CBC
HCT: 35.4 % — ABNORMAL LOW (ref 39.0–52.0)
Hemoglobin: 11.7 g/dL — ABNORMAL LOW (ref 13.0–17.0)
MCH: 29.8 pg (ref 26.0–34.0)
MCHC: 33.1 g/dL (ref 30.0–36.0)
MCV: 90.1 fL (ref 78.0–100.0)

## 2010-12-30 LAB — SURGICAL PCR SCREEN: Staphylococcus aureus: NEGATIVE

## 2010-12-30 LAB — URINALYSIS, ROUTINE W REFLEX MICROSCOPIC
Glucose, UA: NEGATIVE mg/dL
Hgb urine dipstick: NEGATIVE
Protein, ur: NEGATIVE mg/dL

## 2010-12-30 LAB — ABO/RH: ABO/RH(D): O POS

## 2010-12-30 LAB — BASIC METABOLIC PANEL
GFR calc Af Amer: 43 mL/min — ABNORMAL LOW (ref >60)
GFR calc non Af Amer: 36 mL/min — ABNORMAL LOW (ref >60)
Glucose, Bld: 101 mg/dL — ABNORMAL HIGH (ref 70–99)
Potassium: 4 mEq/L (ref 3.5–5.1)
Sodium: 143 mEq/L (ref 135–145)

## 2010-12-30 LAB — TYPE AND SCREEN
ABO/RH(D): O POS
Antibody Screen: NEGATIVE

## 2010-12-30 NOTE — Telephone Encounter (Signed)
Patient is scheduled for surgery on 01/04/2011 w/Dr. Pasty Arch at [facility] requesting as to when patient should stop taking Pradaxa prior to Sx.?

## 2010-12-30 NOTE — Telephone Encounter (Signed)
Hold pradaxa 5 days before surg - should resume as soon after surgery as surgeon will allow

## 2010-12-31 NOTE — Telephone Encounter (Signed)
Spoke w/ Courtney/Informed of MD's instructions.

## 2011-01-04 ENCOUNTER — Inpatient Hospital Stay (HOSPITAL_COMMUNITY)
Admission: RE | Admit: 2011-01-04 | Discharge: 2011-01-07 | DRG: 470 | Disposition: A | Discharge status: Skilled Nursing Facility | Payer: Medicare Other | Source: Ambulatory Visit | Attending: Orthopedic Surgery | Admitting: Orthopedic Surgery

## 2011-01-04 DIAGNOSIS — Z01812 Encounter for preprocedural laboratory examination: Secondary | ICD-10-CM

## 2011-01-04 DIAGNOSIS — E785 Hyperlipidemia, unspecified: Secondary | ICD-10-CM | POA: Diagnosis present

## 2011-01-04 DIAGNOSIS — R339 Retention of urine, unspecified: Secondary | ICD-10-CM | POA: Diagnosis not present

## 2011-01-04 DIAGNOSIS — M1710 Unilateral primary osteoarthritis, unspecified knee: Principal | ICD-10-CM | POA: Diagnosis present

## 2011-01-04 DIAGNOSIS — Z95810 Presence of automatic (implantable) cardiac defibrillator: Secondary | ICD-10-CM

## 2011-01-04 DIAGNOSIS — I2510 Atherosclerotic heart disease of native coronary artery without angina pectoris: Secondary | ICD-10-CM | POA: Diagnosis present

## 2011-01-04 DIAGNOSIS — I10 Essential (primary) hypertension: Secondary | ICD-10-CM | POA: Diagnosis present

## 2011-01-04 HISTORY — PX: TOTAL KNEE ARTHROPLASTY: SHX125

## 2011-01-04 NOTE — Telephone Encounter (Signed)
A user error has taken place: encounter opened in error, closed for administrative reasons.

## 2011-01-04 NOTE — Op Note (Signed)
Jacob [name], Jacob [name]                ACCOUNT NO.:  [id number]  MEDICAL RECORD NO.:  [id number]  LOCATION:  0006                         FACILITY:  [facility]  PHYSICIAN:  Madlyn Frankel. Charlann Boxer, M.D.  DATE OF BIRTH:  1931-09-21  DATE OF PROCEDURE:  01/04/2011 DATE OF DISCHARGE:                              OPERATIVE REPORT   PREOPERATIVE DIAGNOSIS:  Advanced left knee osteoarthritis.  POSTOPERATIVE DIAGNOSIS:  Advanced left knee osteoarthritis.  PROCEDURE:  Left total knee replacement utilizing components.  Components utilized was a Scientist, research (life sciences) posterior stabilized knee system with size 5 femur, 5 tibia, 12.5 insert and 41 patellar button.  SURGEON:  Madlyn Frankel. Charlann Boxer, M.D.  ASSISTANT:  [name]. Ernestene Kiel and Lanney Gins, PA  ANESTHESIA:  Preoperative regional femoral nerve block and general.  SPECIMENS:  None.  COMPLICATIONS:  None.  DRAINS:  One Hemovac.  The patient was stable to recovery room.  Tourniquet time was 42 minutes at 250 mmHg.  BLOOD LOSS:  About 100 cc.  INDICATIONS FOR PROCEDURE:  Mr. Faul is a 77 year old gentleman was seen at request for bilateral advanced knee arthritis with slight flexion contracture and varus deformity.  He had failed conservative measures at this point and was ready to proceed with more definitive measures.  Due to some underlying medical issues once he was cleared from surgery standpoint speaking to his cardiologist, he was scheduled for surgery.  PROCEDURE IN DETAIL:  The patient was brought to operative theater. Once adequate anesthesia, preoperative antibiotics, Ancef administered, the patient was positioned supine with left thigh tourniquet placed. Left lower extremity was then prepped and draped in sterile fashion with left foot placed in the [facility] leg holder.  The time-out was performed identifying the patient, planned procedure and extremity.  Leg was exsanguinated, tourniquet elevated to 250 mmHg.  Midline  incision was made followed by medial arthrotomy.  Following initial exposure and debridement, attention was first directed to patella.  Precut measurement was noted be about 27 mm.  I resected down to about 15 mm, used 41 patellar button to restore height.  Lug holes were drilled and a metal shim placed to protect the patella from retractors and saw blades.  Attention was now directed to femur.  Femoral canal was opened with a drill, irrigated to try to prevent fat emboli.  With 3 degrees of valgus, 11 mm of bone resected off the distal femur due to preoperative flexion contracture.  The tibia was then subluxated anteriorly using extramedullary guide and measured resection of 10 mm of the proximal lateral tibia was made.  At this point, we evaluated the extension gap to make sure stable medially and laterally with the 10 mm insert.  Following this, I determined I need to remove 1 mm or so more bone and using this saw without the guide I removed a minimal amount of bone off the proximal tibia and confirmed the gap would be stable at this point. I also confirmed with an alignment rod that the cut was perpendicular in the coronal plane using alignment rod based on the tibial spines.  Attention was now directed to the femur.  Femur was sized to be a size  5 and size 5 rotation block was then pinned into position, anterior referenced using the C clamp to set rotation.  The 4 in 1 cutting block was then pinned in position.  Anterior, posterior, and chamfer cuts were made without difficulty nor notching. Final box cut was made off the lateral aspect of distal femur.  Following this, the tibia was subluxated anteriorly.  The cut surface, size 5, this was after removing the medial osteophytes.  It was pinned into position to the medial third of the tubercle, drilled, and keel punched.  A trial reduction now carried out with a 5 femur, 5 tibia, initially 10 mm insert.  I then went to a 12.5  insert from a flexion standpoint, felt that the knee came to full extension without any issues.  The patella tracked through the trochlea without application of pressure.  At this point, the trial components removed, the synovial capsule junction of the knee was injected with 0.25% Marcaine with epinephrine and 1 cc of Toradol, total 51 cc.  Final components were opened and cement mixed.  The knee was irrigated with normal saline solution, pulse lavaged.  Final components were then cemented on to clean and dried cut surfaces of bone.  The knee was brought to extension with 12.5 insert. Cement was removed.  Once the cement had fully cured and excess cement was removed throughout the knee, the final 12.5 insert was chosen and placed.  The tourniquet had been let down after 43 minutes without significant hemostasis required.  We reirrigated the knee with normal saline solution, pulse lavage.  Medium Hemovac drain was placed deep. The extensor mechanism was then reapproximated using a #1 Vicryl with the knee in flexion.  The remaining wound was closed with 2-0 Vicryl, running 4-0 Monocryl.  The knee was cleaned, dried, and dressed sterilely using Dermabond and Aquacel dressing. Drain site dressed separately.  Knee was wrapped loosely in Ace wrap. He was then brought to recovery room, extubated in stable condition tolerating the procedure well.     Madlyn Frankel Charlann Boxer, M.D.     MDO/MEDQ  D:  01/04/2011  T:  01/04/2011  Job:  [id number]  cc:   Vesta Mixer, M.D. Fax: [phone]  Noralyn Pick. Eden Emms, MD, [facility] [address]  [address] [place] [address]  Electronically Signed by Durene Romans M.D. on 01/04/2011 02:43:47 PM

## 2011-01-05 LAB — CBC
Hemoglobin: 8.9 g/dL — ABNORMAL LOW (ref 13.0–17.0)
MCH: 30.1 pg (ref 26.0–34.0)
Platelets: 106 10*3/uL — ABNORMAL LOW (ref 150–400)
RBC: 2.96 MIL/uL — ABNORMAL LOW (ref 4.22–5.81)
WBC: 7.2 10*3/uL (ref 4.0–10.5)

## 2011-01-05 LAB — BASIC METABOLIC PANEL
CO2: 30 mEq/L (ref 19–32)
Calcium: 8.1 mg/dL — ABNORMAL LOW (ref 8.4–10.5)
Chloride: 106 mEq/L (ref 96–112)
Glucose, Bld: 141 mg/dL — ABNORMAL HIGH (ref 70–99)
Potassium: 3.8 mEq/L (ref 3.5–5.1)
Sodium: 140 mEq/L (ref 135–145)

## 2011-01-06 LAB — CBC
HCT: 27.2 % — ABNORMAL LOW (ref 39.0–52.0)
Hemoglobin: 9.2 g/dL — ABNORMAL LOW (ref 13.0–17.0)
MCH: 30 pg (ref 26.0–34.0)
MCV: 88.6 fL (ref 78.0–100.0)
Platelets: 116 10*3/uL — ABNORMAL LOW (ref 150–400)
RBC: 3.07 MIL/uL — ABNORMAL LOW (ref 4.22–5.81)
WBC: 8.8 10*3/uL (ref 4.0–10.5)

## 2011-01-06 LAB — BASIC METABOLIC PANEL
BUN: 14 mg/dL (ref 6–23)
CO2: 29 mEq/L (ref 19–32)
Calcium: 8.4 mg/dL (ref 8.4–10.5)
Chloride: 104 mEq/L (ref 96–112)
Creatinine, Ser: 1.45 mg/dL — ABNORMAL HIGH (ref 0.50–1.35)
Glucose, Bld: 157 mg/dL — ABNORMAL HIGH (ref 70–99)

## 2011-01-06 LAB — URINALYSIS, ROUTINE W REFLEX MICROSCOPIC
Nitrite: NEGATIVE
Specific Gravity, Urine: 1.02 (ref 1.005–1.030)
Urobilinogen, UA: 0.2 mg/dL (ref 0.0–1.0)
pH: 5.5 (ref 5.0–8.0)

## 2011-01-06 LAB — URINE MICROSCOPIC-ADD ON

## 2011-01-07 ENCOUNTER — Emergency Department (HOSPITAL_COMMUNITY)
Admission: EM | Admit: 2011-01-07 | Discharge: 2011-01-08 | Disposition: A | Discharge status: Other Acute Inpatient Hospital | Payer: Medicare Other | Source: Home / Self Care | Attending: Emergency Medicine | Admitting: Emergency Medicine

## 2011-01-07 DIAGNOSIS — I252 Old myocardial infarction: Secondary | ICD-10-CM | POA: Insufficient documentation

## 2011-01-07 DIAGNOSIS — I509 Heart failure, unspecified: Secondary | ICD-10-CM | POA: Insufficient documentation

## 2011-01-07 DIAGNOSIS — N189 Chronic kidney disease, unspecified: Secondary | ICD-10-CM | POA: Insufficient documentation

## 2011-01-07 DIAGNOSIS — Z95810 Presence of automatic (implantable) cardiac defibrillator: Secondary | ICD-10-CM | POA: Insufficient documentation

## 2011-01-07 DIAGNOSIS — I129 Hypertensive chronic kidney disease with stage 1 through stage 4 chronic kidney disease, or unspecified chronic kidney disease: Secondary | ICD-10-CM | POA: Insufficient documentation

## 2011-01-07 DIAGNOSIS — Z9889 Other specified postprocedural states: Secondary | ICD-10-CM | POA: Insufficient documentation

## 2011-01-07 DIAGNOSIS — R319 Hematuria, unspecified: Secondary | ICD-10-CM | POA: Insufficient documentation

## 2011-01-07 LAB — BASIC METABOLIC PANEL
BUN: 21 mg/dL (ref 6–23)
CO2: 28 mEq/L (ref 19–32)
GFR calc non Af Amer: 32 mL/min — ABNORMAL LOW (ref >60)
Glucose, Bld: 148 mg/dL — ABNORMAL HIGH (ref 70–99)
Potassium: 4.2 mEq/L (ref 3.5–5.1)
Sodium: 137 mEq/L (ref 135–145)

## 2011-01-07 LAB — URINE CULTURE
Colony Count: NO GROWTH
Culture  Setup Time: 201207260132
Culture: NO GROWTH
Special Requests: NEGATIVE

## 2011-01-08 ENCOUNTER — Inpatient Hospital Stay (HOSPITAL_COMMUNITY)
Admission: AD | Admit: 2011-01-08 | Discharge: 2011-01-10 | DRG: 683 | Disposition: A | Discharge status: Skilled Nursing Facility | Payer: Medicare Other | Source: Other Acute Inpatient Hospital | Attending: Internal Medicine | Admitting: Internal Medicine

## 2011-01-08 DIAGNOSIS — Z7982 Long term (current) use of aspirin: Secondary | ICD-10-CM

## 2011-01-08 DIAGNOSIS — I10 Essential (primary) hypertension: Secondary | ICD-10-CM | POA: Diagnosis present

## 2011-01-08 DIAGNOSIS — D62 Acute posthemorrhagic anemia: Secondary | ICD-10-CM | POA: Diagnosis present

## 2011-01-08 DIAGNOSIS — I5022 Chronic systolic (congestive) heart failure: Secondary | ICD-10-CM | POA: Diagnosis present

## 2011-01-08 DIAGNOSIS — Z96659 Presence of unspecified artificial knee joint: Secondary | ICD-10-CM

## 2011-01-08 DIAGNOSIS — N138 Other obstructive and reflux uropathy: Secondary | ICD-10-CM | POA: Diagnosis present

## 2011-01-08 DIAGNOSIS — I2510 Atherosclerotic heart disease of native coronary artery without angina pectoris: Secondary | ICD-10-CM | POA: Diagnosis present

## 2011-01-08 DIAGNOSIS — N179 Acute kidney failure, unspecified: Principal | ICD-10-CM | POA: Diagnosis present

## 2011-01-08 DIAGNOSIS — I2589 Other forms of chronic ischemic heart disease: Secondary | ICD-10-CM | POA: Diagnosis present

## 2011-01-08 DIAGNOSIS — I959 Hypotension, unspecified: Secondary | ICD-10-CM | POA: Diagnosis present

## 2011-01-08 DIAGNOSIS — I509 Heart failure, unspecified: Secondary | ICD-10-CM | POA: Diagnosis present

## 2011-01-08 DIAGNOSIS — Z7902 Long term (current) use of antithrombotics/antiplatelets: Secondary | ICD-10-CM

## 2011-01-08 DIAGNOSIS — Z79899 Other long term (current) drug therapy: Secondary | ICD-10-CM

## 2011-01-08 DIAGNOSIS — N401 Enlarged prostate with lower urinary tract symptoms: Secondary | ICD-10-CM | POA: Diagnosis present

## 2011-01-08 DIAGNOSIS — R310 Gross hematuria: Secondary | ICD-10-CM | POA: Diagnosis present

## 2011-01-08 DIAGNOSIS — K5900 Constipation, unspecified: Secondary | ICD-10-CM | POA: Diagnosis not present

## 2011-01-08 DIAGNOSIS — T502X5A Adverse effect of carbonic-anhydrase inhibitors, benzothiadiazides and other diuretics, initial encounter: Secondary | ICD-10-CM | POA: Diagnosis present

## 2011-01-08 DIAGNOSIS — D72829 Elevated white blood cell count, unspecified: Secondary | ICD-10-CM | POA: Diagnosis present

## 2011-01-08 DIAGNOSIS — R339 Retention of urine, unspecified: Secondary | ICD-10-CM | POA: Diagnosis present

## 2011-01-08 DIAGNOSIS — M1990 Unspecified osteoarthritis, unspecified site: Secondary | ICD-10-CM | POA: Diagnosis present

## 2011-01-08 DIAGNOSIS — E785 Hyperlipidemia, unspecified: Secondary | ICD-10-CM | POA: Diagnosis present

## 2011-01-08 LAB — BASIC METABOLIC PANEL
BUN: 36 mg/dL — ABNORMAL HIGH (ref 6–23)
BUN: 33 mg/dL — ABNORMAL HIGH (ref 6–23)
CO2: 26 mEq/L (ref 19–32)
Calcium: 8.5 mg/dL (ref 8.4–10.5)
Chloride: 100 mEq/L (ref 96–112)
Chloride: 103 mEq/L (ref 96–112)
Creatinine, Ser: 3.06 mg/dL — ABNORMAL HIGH (ref 0.50–1.35)
GFR calc Af Amer: 24 mL/min — ABNORMAL LOW (ref >60)
Glucose, Bld: 145 mg/dL — ABNORMAL HIGH (ref 70–99)
Potassium: 3.8 mEq/L (ref 3.5–5.1)
Sodium: 139 mEq/L (ref 135–145)

## 2011-01-08 LAB — TROPONIN I
Troponin I: <0.3 ng/mL (ref <0.30)
Troponin I: <0.3 ng/mL (ref <0.30)

## 2011-01-08 LAB — CBC
HCT: 23 % — ABNORMAL LOW (ref 39.0–52.0)
MCH: 30.7 pg (ref 26.0–34.0)
MCV: 88.1 fL (ref 78.0–100.0)
Platelets: 140 10*3/uL — ABNORMAL LOW (ref 150–400)
RDW: 13.7 % (ref 11.5–15.5)
WBC: 13.9 10*3/uL — ABNORMAL HIGH (ref 4.0–10.5)

## 2011-01-08 LAB — CK TOTAL AND CKMB (NOT AT ARMC)
Relative Index: 2.2 (ref 0.0–2.5)
Total CK: 157 U/L (ref 7–232)
Total CK: 205 U/L (ref 7–232)

## 2011-01-08 LAB — HEMOGLOBIN AND HEMATOCRIT, BLOOD
HCT: 26.7 % — ABNORMAL LOW (ref 39.0–52.0)
Hemoglobin: 9.3 g/dL — ABNORMAL LOW (ref 13.0–17.0)

## 2011-01-08 LAB — DIFFERENTIAL
Eosinophils Absolute: 0.3 10*3/uL (ref 0.0–0.7)
Eosinophils Relative: 2 % (ref 0–5)
Lymphocytes Relative: 10 % — ABNORMAL LOW (ref 12–46)
Lymphs Abs: 1.5 10*3/uL (ref 0.7–4.0)
Monocytes Absolute: 1.4 10*3/uL — ABNORMAL HIGH (ref 0.1–1.0)
Monocytes Relative: 10 % (ref 3–12)

## 2011-01-08 LAB — URINALYSIS, ROUTINE W REFLEX MICROSCOPIC
Nitrite: NEGATIVE
Protein, ur: >300 mg/dL — AB
Urobilinogen, UA: 0.2 mg/dL (ref 0.0–1.0)
pH: 5.5 (ref 5.0–8.0)

## 2011-01-08 NOTE — H&P (Unsigned)
NAME:  Jacob [name], Jacob [name]                     ACCOUNT NO.:  MEDICAL RECORD NO.:  [id number]  LOCATION:                                 FACILITY:  PHYSICIAN:  Madlyn Frankel. Charlann Boxer, M.D.  DATE OF BIRTH:  Feb 22, 1932  DATE OF ADMISSION: DATE OF DISCHARGE:                             HISTORY & PHYSICAL   ADMISSION DIAGNOSIS:  Left knee osteoarthritis.  HISTORY OF PRESENT ILLNESS:  A 77 year old gentleman with a history of bilateral knee osteoarthritis, left greater than right.  He has failed conservative treatment for the management of his pain.  After discussion of treatment, benefits, risks and options, the patient now scheduled for total knee arthroplasty of the left knee.  Note that the patient is not a candidate for tranexamic acid and will not receive that preop.  Also of note that he will be going to a nursing home postoperatively and he has been cleared for surgery and his medical doctor is Dr. Felicity [name] at [facility].  PAST MEDICAL HISTORY:  Drug allergy to PENICILLIN with swelling and itching.  CURRENT MEDICATIONS: 1. Carvedilol 6.25 mg 1 b.i.d. 2. Aspirin 81 mg 1 daily. 3. Hyzaar 50/12.5 one daily. 4. Omeprazole 20 mg 1 daily. 5. Imdur 30 mg XR 24-hour tablet 1 daily. 6. Simvastatin 40 mg 1 daily. 7. Hydrocodone p.r.n. 8. Vitamin B12 one daily. 9. Lasix 20 mg one-half tablet daily. 10.VESIcare 5 mg one daily. 11.Fluticasone nasal spray once a day. 12.Flomax 0.4 mg 1 daily. 13.Avodart 0.5 mg 1 daily. 14.Pradaxa 150 mg 1 b.i.d.  Serious medical illnesses include hypertension, hyperlipidemia, coronary artery disease, congestive heart failure and osteoarthritis.  Previous surgeries include placement of implanted defibrillator.  FAMILY HISTORY:  Positive for congestive heart failure and heart disease.  SOCIAL HISTORY:  The patient is married.  He is retired.  He lives at home.  He does not smoke and does not drink.  He will be going to a nursing facility  postop.  REVIEW OF SYSTEMS:  CENTRAL NERVOUS SYSTEM:  Negative for headache, blurred vision or dizziness.  Positive for impaired hearing.  PULMONARY: Negative for shortness breath, PND or orthopnea.  CARDIOVASCULAR: Positive for hypertension and implanted internal defibrillator for irregular heartbeat, also a history of CHF.  GASTROINTESTINAL:  Negative for ulcers or hepatitis.  Positive for reflux.  GU:  Positive for urinary incontinence.  MUSCULOSKELETAL:  Positive in HPI.  PHYSICAL EXAMINATION:  VITAL SIGNS:  BP 110/58, respirations 16, pulse 68 and regular. GENERAL APPEARANCE:  This is a well-developed, well-nourished gentleman in no acute distress. HEENT:  Head normocephalic.  Nose patent.  Ears patent.  Pupils are equal, round and reactive to light.  Throat without injection.  NECK: Supple without adenopathy.  Carotids 2+ without bruits. CHEST:  Clear to auscultation.  No rales or rhonchi.  Respirations 16. HEART:  Regular rate and rhythm at 68 beats per minute without murmur. ABDOMEN:  Soft.  Active bowel sounds.  No masses, organomegaly. NEUROLOGIC:  The patient is alert and oriented to time, place and person.  Cranial nerves II-XII grossly intact. EXTREMITIES:  Bilateral flexion contractures of 5 degrees with varus deformity of both knees.  Neurovascular status intact.  IMPRESSION:  Bilateral knee osteoarthritis, left greater than right.  PLAN:  Total knee arthroplasty, left knee.     [name]. Estie Sproule, P.A.   ______________________________ Madlyn Frankel Charlann Boxer, M.D.    SJC/MEDQ  D:  12/30/2010  T:  12/31/2010  Job:  [id number]

## 2011-01-09 LAB — CBC
HCT: 23.6 % — ABNORMAL LOW (ref 39.0–52.0)
MCH: 30.7 pg (ref 26.0–34.0)
MCHC: 34.7 g/dL (ref 30.0–36.0)
MCV: 88.4 fL (ref 78.0–100.0)
RDW: 13.9 % (ref 11.5–15.5)
WBC: 8 10*3/uL (ref 4.0–10.5)

## 2011-01-09 LAB — URINE CULTURE: Culture  Setup Time: 201207270456

## 2011-01-09 LAB — BASIC METABOLIC PANEL
BUN: 28 mg/dL — ABNORMAL HIGH (ref 6–23)
Calcium: 8.3 mg/dL — ABNORMAL LOW (ref 8.4–10.5)
Chloride: 106 mEq/L (ref 96–112)
Creatinine, Ser: 1.77 mg/dL — ABNORMAL HIGH (ref 0.50–1.35)
GFR calc Af Amer: 45 mL/min — ABNORMAL LOW (ref >60)

## 2011-01-10 LAB — BASIC METABOLIC PANEL
Calcium: 8.4 mg/dL (ref 8.4–10.5)
GFR calc Af Amer: >60 mL/min (ref >60)
GFR calc non Af Amer: 55 mL/min — ABNORMAL LOW (ref >60)
Glucose, Bld: 124 mg/dL — ABNORMAL HIGH (ref 70–99)
Potassium: 4 mEq/L (ref 3.5–5.1)
Sodium: 141 mEq/L (ref 135–145)

## 2011-01-10 LAB — CBC
MCH: 30.6 pg (ref 26.0–34.0)
MCHC: 34.3 g/dL (ref 30.0–36.0)
RDW: 14 % (ref 11.5–15.5)

## 2011-01-10 LAB — DIFFERENTIAL
Basophils Absolute: 0 10*3/uL (ref 0.0–0.1)
Basophils Relative: 0 % (ref 0–1)
Eosinophils Absolute: 0.3 10*3/uL (ref 0.0–0.7)
Eosinophils Relative: 5 % (ref 0–5)
Monocytes Absolute: 0.7 10*3/uL (ref 0.1–1.0)
Monocytes Relative: 10 % (ref 3–12)
Neutro Abs: 4.6 10*3/uL (ref 1.7–7.7)

## 2011-01-10 LAB — CROSSMATCH
ABO/RH(D): O POS
Unit division: 0

## 2011-01-11 NOTE — Discharge Summary (Signed)
Jacob [name], Jacob [name]                ACCOUNT NO.:  [id number]  MEDICAL RECORD NO.:  [id number]  LOCATION:  3702                         FACILITY:  [facility]  PHYSICIAN:  Lonia Blood, M.D.       DATE OF BIRTH:  [birth year]  DATE OF ADMISSION:  01/08/2011 DATE OF DISCHARGE:  01/10/2011                              DISCHARGE SUMMARY   PRIMARY CARE PHYSICIAN:  Vikki Ports A. Felicity Coyer, MD  DISCHARGE DIAGNOSES: 1. Acute renal failure due to a combination of hypotension and     angiotensin receptor blocker diuretic effect and benign prostatic     hyperplasia with urinary retention -- resolved. 2. Urinary retention, most likely due to benign prostatic hyperplasia     -- the patient will be discharged to the nursing home with a Foley     catheter in place.  The patient may have voiding trials when he is     fully mobile.  The patient will need outpatient followup with the     Urology. 3. Severe anemia, most likely due to acute blood loss anemia post knee    replacement -- the patient did have some hematuria but it is     difficult to attribute the cause of the anemia to the hematuria     alone.  No hematuria was witnessed in the hospital by myself in 48     hours of monitoring of the patient. 4. Leukocytosis on admission, most likely acute phase reaction.  No     infectious process was diagnosed during this admission. 5. Recent left knee replacement. 6. Ischemic cardiomyopathy with chronically occluded right coronary     artery with collaterals.  No significant disease in the left side. 7. Chronic systolic congestive heart failure. 8. Hypertension -- current blood pressure readings are on the low side     due to the recent blood loss -- in the future the patient may need     resumption of his antihypertensives. 9. Status post AICD placement, placed in July 2008.  DISCHARGE MEDICATIONS: 1. Senokot 2 tablets by mouth daily at bedtime. 2. Aspirin 81 mg daily. 3. Avodart 0.5 mg daily. 4. Coreg 6.25  mg twice a day at 8:00 a.m. and 5:00 p.m. 5. Flomax 0.4 mg daily. 6. Flonase 1 spray daily. 7. Hydrocodone 5/325 one to two tablets by mouth 4-6 hours as needed     for pain. 8. Imdur 30 mg daily. 9. Omeprazole 20 mg daily. 10.Pradaxa 150 mg twice a day. 11.Simvastatin 40 mg daily. 12.Vitamin B12 one tablet daily.  CONDITION ON DISCHARGE:  Jacob [name] was discharged in good condition, alert, oriented, in no acute distress.  Able to get out of bed on his own.  Foley catheter will be left in and a leg bag will be placed.  The patient will go back to his nursing home for continuation of rehabilitation post knee replacement.  PROCEDURE DONE THIS ADMISSION:  The patient underwent transfusion of 2 units of packed red blood cells.  CONSULTATIONS:  No consultation obtained.  HISTORY AND PHYSICAL:  Refer to dictation done by Dr. Conley Rolls.  HOSPITAL COURSE:  Jacob [name] is a 77 year old gentleman with multiple medical problems  including ischemic cardiomyopathy, hypertension, hyperlipidemia was brought back to the hospital after 24 hours in a nursing home with acute renal failure and severe anemia.  He also had urinary retention and developed some mild hematuria when a Foley catheter was inserted.  The hematuria cleared fast.  The patient was started on intravenous fluids and he received transfusion of total 2 units of packed red blood cells.  In looking back at the case of Jacob [name], we have noticed that the anemia was most likely due to acute blood loss anemia postoperatively.  The patient was monitored for 48 hours without any overt gastrointestinal or genitourinary bleeding noted.  He was continued on Pradaxa during this admission and I do not think that currently anticoagulation poses risk for further bleeding. Posttransfusion of 2 units of packed red blood cells, the patient remained hemodynamically stable without evidence for ongoing hemoglobin drop.  Jacob [name] renal failure resolved  completely with intravenous fluids and with discontinuation of angiotensin receptor blocker and diuretics.  His discharge creatinine is 1.2 and BUN is 18.  The plan is to leave the Foley catheter in place for the urinary retention and treat the patient with Avodart and Flomax.  Otherwise, he is going to be transferred back to the nursing home to continue his rehabilitation process post knee replacement.     Lonia Blood, M.D.     SL/MEDQ  D:  01/10/2011  T:  01/10/2011  Job:  [id number]  cc:   Lenon Curt. Chilton Si, M.D. Madlyn Frankel Charlann Boxer, M.D. Valerie A. Felicity Coyer, MD  Electronically Signed by Lonia Blood M.D. on 01/11/2011 05:37:24 PM

## 2011-01-11 NOTE — H&P (Addendum)
NAMEHADRIEL, Jacob [name] NO.:  [id number]  MEDICAL RECORD NO.:  [id number]  LOCATION:  WLED                         FACILITY:  [facility]  PHYSICIAN:  Houston Siren, MD           DATE OF BIRTH:  1931-09-19  DATE OF ADMISSION:  01/07/2011 DATE OF DISCHARGE:                             HISTORY & PHYSICAL   PRIMARY CARE PHYSICIAN:  Lenon Curt. Chilton Si, MD  ORTHOPEDICS:  Madlyn Frankel. Charlann Boxer, MD  CARDIOLOGIST:  Noralyn Pick. Eden Emms, MD, [facility]  ADVANCE DIRECTIVE:  Full code.  REASON FOR ADMISSION:  Acute renal failure, anemia, and UTI.  HISTORY OF PRESENT ILLNESS:  This is a 77 year old male with history of left knee replacement on December 05, 2010, severe coronary artery disease, hypertension, hyperlipidemia, congestive heart failure with ejection fraction about 20% to 30%, status post IVCD placement with recent catheterization in March 2012, presents with urinary retention at the rehab place with subsequent hematuria.  Apparently, he had urinary retention and Foley catheter was attempted to be placed, but after the attempt he started having gross hematuria.  The Foley was subsequently placed in the emergency room and had about 6-700 cc return of bloody urine .  He originally found to be hypotensive with systolic blood pressure in 80 and IV fluids given with resulting normotension. Workup revealed that he had a creatinine of 3.0, a urinalysis with small leukocyte esterase and large amount of red blood cells and white blood cells.  He has a leukocytosis with white count of 13,000, but his hemoglobin is 8.0.  Note that his hemoglobin was 11.7 on July 18th.  His potassium is normal.  Because of the acute renal failure and anemia, hospitalist was asked to admit the patient.  PAST MEDICAL HISTORY: 1. Hypertension. 2. Hyperlipidemia. 3. Coronary artery disease. 4. Congestive heart failure. 5. Osteoarthritis. 6. Recent left knee replacement. 7. Recent cardiac catheterization. 8. IVCD  placement.  ALLERGIES:  PENICILLIN, causing swelling and itching.  FAMILY HISTORY:  Positive for congestive heart failure.  SOCIAL HISTORY:  He is married, lives at home generally.  He does not smoke or drink.  He is retired.  REVIEW OF SYSTEMS:  Otherwise unremarkable.  He has no chest pain or shortness of breath.  MEDICATIONS: 1. Aspirin 81 mg per day. 2. Avodart. 3. Bactrim. 4. Benadryl. 5. Carvedilol. 6. Colace. 7. Hyzaar. 8. Isosorbide. 9. Norco. 10.Pradaxa. 11.VESIcare. 12.Ferrous sulfate. 13.Flomax. 14.Flutamide. 15.MiraLax. 16.Omeprazole. 17.Peridex. 18.Simvastatin. 19.B12.  PHYSICAL EXAMINATION:  VITAL SIGNS:  Blood pressure is 101/52, pulse of 80, respiratory rate of 14, temperature 98.8. GENERAL:  He is alert and oriented and answers questions appropriately. SKIN:  Warm and dry.  No signs of shock. THROAT:  Clear. NECK:  Supple. CARDIAC:  Revealed S1, S2 regular. LUNGS:  Clear. ABDOMEN:  Obese, nondistended, nontender.  Foley in place with hematuria.  It is beginning to clear up. EXTREMITIES:  With no edema, accepted around his left knee in Band-Aid with no evidence of infection.  No calf tenderness.  NEUROLOGIC AND PSYCHIATRIC:  Unremarkable.  OBJECTIVE FINDINGS:  As noted above.  White count 13.9 thousand, hemoglobin of 8.0, MCV of 88, platelet count of  140,000.  Urinalysis had blood and white cells.  Potassium is 4.0, BUN 36, creatinine 3.06, bicarb of 26, glucose 130.  IMPRESSION:  This is a 77 year old with severe coronary artery disease, very recent left knee replacement on Pradaxa, having hematuria likely from Foley manipulation at the rehab place, found to have acute renal failure, hypotension, and anemia.  We need to transfer him to [facility] because I am really concerned that he will go into progressive renal failure.  Review of his electronic medical records shows that his creatinine has increased from 1.45 to 2.0 to 3.06  progressively. Because of his severe heart disease and his anemia, we will transfuse him 1 unit of packed red blood cells.  We will do this slowly since he has history of congestive heart failure.  We will repeat H and H after the transfusion, and he probably will need another unit.  For his urinary tract infection, we will treat it with Rocephin.  As soon as his medications are reconciled, we will continue them including the Pradaxa and other medication except the Hyzaar.  He is a full code.  Will be admitted to [facility]. We will rule him out as well with serial CPKs and troponins.  He is otherwise stable.     Houston Siren, MD     PL/MEDQ  D:  01/08/2011  T:  01/08/2011  Job:  [id number]  Electronically Signed by Houston Siren  on 02/02/2011 12:31:57 AM

## 2011-01-14 LAB — CULTURE, BLOOD (ROUTINE X 2)
Culture  Setup Time: 201207270843
Culture  Setup Time: 201207271044
Culture: NO GROWTH
Culture: NO GROWTH

## 2011-01-16 NOTE — Discharge Summary (Signed)
  Jacob [name], Jacob [name]                ACCOUNT NO.:  [id number]  MEDICAL RECORD NO.:  [id number]  LOCATION:  1619                         FACILITY:  [facility]  PHYSICIAN:  Madlyn Frankel. Charlann Boxer, M.D.  DATE OF BIRTH:  [birth year]  DATE OF ADMISSION:  01/04/2011 DATE OF DISCHARGE:  01/07/2011                        DISCHARGE SUMMARY - REFERRING   PROCEDURE:  Left total knee arthroplasty.  ADMITTING DIAGNOSIS:  Left knee osteoarthritis.  DISCHARGE DIAGNOSES: 1. Status post left total knee arthroplasty. 2. Hypertension. 3. Hyperlipidemia. 4. Coronary artery disease. 5. Congestive heart failure.  HISTORY OF PRESENT ILLNESS:  The patient is a 77 year old gentleman with history of bilateral knee osteoarthritis, left greater than right; history of conservative treatment for management of his pain.  After several options were discussed with the patient, the patient wished to proceed with surgery.  Risks, benefits, expectations were discussed with the patient.  The patient understands these risks, benefits and expectations and wishes to proceed with a left total knee arthroplasty per Dr. Charlann Boxer on January 04, 2011, on inpatient basis.  The patient has been cleared for surgery by his medical doctor Dr. Felicity Coyer at [facility] and he is planning to go to nursing home postoperatively.  HOSPITAL COURSE:  The patient underwent the above-stated procedure on January 04, 2011.  The patient tolerated the procedure well, was brought to the recovery room in good condition and subsequently to the floor. During multiple days in the hospital, the patient stayed afebrile. Vital signs were stable.  The patient was doing well from aspect of pain of his left knee replacement.  The patient did however throughout the time of hospital had difficulty with urinary retention and unable to urinate.  CONDITION ON DISCHARGE:  Good.  DISCHARGE PLAN:  The patient will be discharged on January 07, 2011, to skilled nursing  facility.  The patient will be discharged with his urinary catheter in place.  The patient will follow up with his urologist in 1 week.  The patient will follow up with Dr. Charlann Boxer at [facility] in 2 weeks.  DISCHARGE MEDICATIONS: 1. Peridex 15 mL p.o. b.i.d. 2. Avodart 0.5 mg 1 p.o. daily. 3. Flomax 0.4 mg 1 p.o. q.a.m. 4. Fluconazole nasal spray 1 spray nasally daily. 5. VESIcare 5 mg one p.o. q.a.m. 6. Vitamin B12 one p.o. daily. 7. Norco 5/325 mg one to two p.o. q.4-6 h. p.r.n. pain. 8. Simvastatin 40 mg 1 p.o. q.h.s. 9. Isosorbide mononitrate XR 30 mg 1 p.o. q.a.m. 10.Omeprazole 20 mg 1 p.o. daily. 11.Hyzaar 50/12.5 mg 1 p.o. q.a.m. 12.Carvedilol 6.25 mg 1 p.o. b.i.d. 13.Aspirin 81 mg one p.o. daily. 14.Pradaxa 150 mg 1 p.o. b.i.d. 15.Iron sulfate 325 mg 1 p.o. t.i.d. for 2 to 3 weeks. 16.Colace 100 mg 1 p.o. b.i.d. 17.MiraLax 17 g 1 p.o. daily for constipation.    ______________________________ Lanney Gins, PA   ______________________________ Madlyn Frankel. Charlann Boxer, M.D.    MB/MEDQ  D:  01/07/2011  T:  01/07/2011  Job:  [id number]  Electronically Signed by Lanney Gins PA on 01/12/2011 11:34:44 AM Electronically Signed by Durene Romans M.D. on 01/16/2011 09:19:17 AM

## 2011-02-01 ENCOUNTER — Other Ambulatory Visit: Payer: Self-pay | Admitting: Cardiovascular Disease

## 2011-02-04 ENCOUNTER — Ambulatory Visit (INDEPENDENT_AMBULATORY_CARE_PROVIDER_SITE_OTHER): Payer: Medicare Other | Admitting: *Deleted

## 2011-02-04 ENCOUNTER — Other Ambulatory Visit: Payer: Self-pay | Admitting: Internal Medicine

## 2011-02-04 DIAGNOSIS — Z95810 Presence of automatic (implantable) cardiac defibrillator: Secondary | ICD-10-CM

## 2011-02-04 DIAGNOSIS — I428 Other cardiomyopathies: Secondary | ICD-10-CM

## 2011-02-10 ENCOUNTER — Encounter: Payer: Self-pay | Admitting: Internal Medicine

## 2011-02-10 ENCOUNTER — Other Ambulatory Visit: Payer: Self-pay | Admitting: Internal Medicine

## 2011-02-10 ENCOUNTER — Ambulatory Visit: Payer: Medicare Other

## 2011-02-10 ENCOUNTER — Ambulatory Visit (INDEPENDENT_AMBULATORY_CARE_PROVIDER_SITE_OTHER): Payer: Medicare Other | Admitting: Internal Medicine

## 2011-02-10 VITALS — BP 110/60 | HR 57 | Temp 98.6°F | Ht 66.0 in | Wt 189.8 lb

## 2011-02-10 DIAGNOSIS — R55 Syncope and collapse: Secondary | ICD-10-CM

## 2011-02-10 DIAGNOSIS — N401 Enlarged prostate with lower urinary tract symptoms: Secondary | ICD-10-CM

## 2011-02-10 DIAGNOSIS — R42 Dizziness and giddiness: Secondary | ICD-10-CM

## 2011-02-10 DIAGNOSIS — N139 Obstructive and reflux uropathy, unspecified: Secondary | ICD-10-CM

## 2011-02-10 LAB — URINALYSIS, ROUTINE W REFLEX MICROSCOPIC
Ketones, ur: NEGATIVE
Specific Gravity, Urine: 1.02 (ref 1.000–1.030)
Urine Glucose: NEGATIVE
Urobilinogen, UA: 0.2 (ref 0.0–1.0)

## 2011-02-10 LAB — CARDIAC PANEL
CK-MB: 0.8 ng/mL (ref 0.3–4.0)
Relative Index: 2.3 calc (ref 0.0–2.5)
Total CK: 35 U/L (ref 7–232)

## 2011-02-10 LAB — CBC WITH DIFFERENTIAL/PLATELET
Basophils Relative: 0.7 % (ref 0.0–3.0)
Eosinophils Absolute: 0.3 10*3/uL (ref 0.0–0.7)
Eosinophils Relative: 4 % (ref 0.0–5.0)
Lymphocytes Relative: 20.6 % (ref 12.0–46.0)
MCHC: 33 g/dL (ref 30.0–36.0)
MCV: 90.6 fl (ref 78.0–100.0)
Monocytes Absolute: 0.6 10*3/uL (ref 0.1–1.0)
Neutrophils Relative %: 67.6 % (ref 43.0–77.0)
Platelets: 176 10*3/uL (ref 150.0–400.0)
RBC: 3.9 Mil/uL — ABNORMAL LOW (ref 4.22–5.81)
WBC: 8 10*3/uL (ref 4.5–10.5)

## 2011-02-10 MED ORDER — CIPROFLOXACIN HCL 250 MG PO TABS: 250.0000 mg | ORAL_TABLET | Freq: Two times a day (BID) | ORAL | Status: AC

## 2011-02-10 NOTE — Progress Notes (Signed)
Addended by: Deatra James on: 02/10/2011 10:20 AM   Modules accepted: Orders

## 2011-02-10 NOTE — Progress Notes (Signed)
  Subjective:     Jacob [name] is a 77 y.o. male who presents for evaluation of dizziness/lightheaded sensation. associated with sweaty sensation and darkening vision but no chest pain, syncope, loss of consciousness, incontinence. The symptoms started 2 weeks ago after replacement of indwelling foley cath by urology and are worse. The attacks occur several times per week and last 10 minutes. Positions that worsen symptoms: any motion and but even at rest. Previous workup/treatments: blood work: post surgical TKR 01/04/11. Associated ear symptoms: none. Associated CNS symptoms: none. Recent infections: UTI with BOO/BPH - indwelling cath since 7/23 TKR, uro eval 01/21/11. Head trauma: denied. Drug ingestion: none. Noise exposure: no occupational exposure. Family history: non-contributory.  The following portions of the patient's history were reviewed and updated as appropriate: allergies, current medications, past family history, past medical history, past social history, past surgical history and problem list.  Review of Systems Cardiovascular: negative for chest pain, palpitations and icd shock Neurological: negative for headaches, seizures and speech problems    Objective:    BP 110/60  Pulse 57  Temp(Src) 98.6 F (37 C) (Oral)  Ht 5\' 6"  (1.676 m)  Wt 189 lb 12.8 oz (86.093 kg)  BMI 30.63 kg/m2  SpO2 97% General appearance: alert, cooperative and no distress family (wife/son) at side Lungs: clear to auscultation bilaterally no wheeze/crackle Heart: regular rate and rhythm, S1, S2 normal, no murmur, click, rub or gallop no BLE edema Neurologic: Alert and oriented X 3, normal strength and tone. Normal symmetric reflexes. Normal coordination and gait      Assessment:    dizzy, near syncope  ongoing every 2-3days since 01/21/11 replacement of indwelling foley cath due to BOO/BPH   Plan:    labs, EKG - hold empiric tx as may be vasogenic stimulation from indwelling cath   EKG: sinus at  87bpm - freq PVCs compared to 09/2010 preop ekg, no acute/ischemic change evident Cath report 11/05/10 reviewed in EMR: chronic RCA occlusion with collateral flow and vasc otherwise unchanged from 12/2010 cath.

## 2011-02-10 NOTE — Patient Instructions (Signed)
It was good to see you today. We have reviewed your prior records including labs and tests today EKG looks ok -  Test(s) ordered today. Your results will be called to you after review (48-72hours after test completion). If any changes need to be made, you will be notified at that time.

## 2011-02-11 LAB — REMOTE ICD DEVICE
AL AMPLITUDE: 0.6 mv
BATTERY VOLTAGE: 2.94 V
DEV-0020ICD: NEGATIVE
DEVICE MODEL ICD: 504434
FVT: 0
MODE SWITCH EPISODES: 1
PACEART VT: 0
TZAT-0001FASTVT: 1
TZAT-0013FASTVT: 2
TZAT-0018FASTVT: NEGATIVE
TZON-0003FASTVT: 333.3 ms
TZST-0001FASTVT: 3
TZST-0001FASTVT: 5
TZST-0001FASTVT: 6
TZST-0003FASTVT: 26 J
TZST-0003FASTVT: 41 J
VENTRICULAR PACING ICD: 0 pct
VF: 0

## 2011-02-23 NOTE — Progress Notes (Signed)
ICD checked by remote. 

## 2011-02-24 ENCOUNTER — Ambulatory Visit (INDEPENDENT_AMBULATORY_CARE_PROVIDER_SITE_OTHER): Payer: Medicare Other | Admitting: Internal Medicine

## 2011-02-24 ENCOUNTER — Encounter: Payer: Self-pay | Admitting: *Deleted

## 2011-02-24 ENCOUNTER — Encounter: Payer: Self-pay | Admitting: Internal Medicine

## 2011-02-24 VITALS — BP 110/50 | HR 79 | Temp 98.8°F | Ht 67.0 in | Wt 191.0 lb

## 2011-02-24 DIAGNOSIS — Z23 Encounter for immunization: Secondary | ICD-10-CM

## 2011-02-24 DIAGNOSIS — I10 Essential (primary) hypertension: Secondary | ICD-10-CM

## 2011-02-24 DIAGNOSIS — E7800 Pure hypercholesterolemia, unspecified: Secondary | ICD-10-CM

## 2011-02-24 DIAGNOSIS — N401 Enlarged prostate with lower urinary tract symptoms: Secondary | ICD-10-CM

## 2011-02-24 NOTE — Patient Instructions (Signed)
It was good to see you today. We have reviewed your prior records including labs and tests today Medications reviewed, no changes at this time. Good luck with ongoing therapy! Please schedule followup in 3-4 months, call sooner if problems.

## 2011-02-24 NOTE — Assessment & Plan Note (Signed)
The current medical regimen is effective;  continue present plan and medications. BP Readings from Last 3 Encounters:  02/24/11 110/50  02/10/11 110/60  11/25/10 130/62

## 2011-02-24 NOTE — Assessment & Plan Note (Signed)
Indwelling foley due to BOO since postop (early 01/2011) - For follow up with uro next week

## 2011-02-24 NOTE — Progress Notes (Signed)
Subjective:    Patient ID: Jacob [name], male    DOB: 23-Apr-1932, 77 y.o.   MRN: [id number]  HPI  Here for follow up - reviewed chronic med issues:  Diffuse OA - both shoulders, right>left, knees and hips  s/p fall accidental on left shoulder fall 2011-saw ortho for same - had steroid injection with temp relief  S/p 12/2010 L TKR with orthoaafter cleared by cards with 11/05/10 cath - ?? future R TKR if L ok  CHF/CAD - follows with [facility] for same - nishan/taylor/[facility] clinic  reports compliance with ongoing medical treatment and no changes in medication dose or frequency.  denies adverse side effects related to current therapy. no cp, SOB or edema -   HTN - reports compliance with ongoing medical treatment and no changes in medication dose or frequency.  denies adverse side effects related to current therapy. no HA or vision changes   dyslipidemia - reports compliance with ongoing medical treatment and no changes in medication dose or frequency.  denies adverse side effects related to current therapy. no GI or muscle c/o   BPH - follows with dr. Isabel Caprice -uro  - BOO 01/2011 requiring indwelling cath >> reeval for removal next week   Past Medical History  Diagnosis Date  . CAD (coronary artery disease)     cath 11/05/10: chronic 100% occ RCA with collateral flow, stable from 12/2008 cath  . Systolic dysfunction 12/2006    AICD  . HTN (hypertension)   . HLD (hyperlipidemia)   . BPH (benign prostatic hyperplasia)     BOO 01/2011  . Barrett esophagus   . History of diverticulitis of colon   . GERD (gastroesophageal reflux disease)   . History of nephrolithiasis   . DJD (degenerative joint disease) of knee   . CHF (congestive heart failure)     echo 2010 LVEF 25%; s/p ICD 12/2006    Review of Systems  Cardiovascular: Negative for chest pain and leg swelling.  Musculoskeletal: Positive for gait problem.  Neurological: Negative for weakness and headaches.       Objective:   Physical Exam  BP 110/50  Pulse 79  Temp(Src) 98.8 F (37.1 C) (Oral)  Ht 5\' 7"  (1.702 m)  Wt 191 lb (86.637 kg)  BMI 29.91 kg/m2  SpO2 97% Constitutional:  overweight. appears well-developed and well-nourished. No distress. Wife at side Neck: Normal range of motion. Neck supple. No JVD present. No thyromegaly present.  Cardiovascular: Normal rate, regular rhythm and normal heart sounds.  No murmur heard. no BLE edema Pulmonary/Chest: Effort normal and breath sounds normal. No respiratory distress. no wheezes.  Abdominal: Soft. Bowel sounds are normal. Patient exhibits no distension. There is no tenderness.  foley leg bag on RLE Psychiatric: he has a normal mood and affect. behavior is normal. Judgment and thought content normal.       Lab Results  Component Value Date   WBC 8.0 02/10/2011   HGB 11.6* 02/10/2011   HCT 35.3* 02/10/2011   PLT 176.0 02/10/2011   CHOL 131 08/26/2010   TRIG 219.0* 08/26/2010   HDL 29.30* 08/26/2010   LDLDIRECT 54.3 08/26/2010   ALT 14 08/26/2010   AST 20 08/26/2010   NA 141 01/10/2011   K 4.0 01/10/2011   CL 109 01/10/2011   CREATININE 1.28 01/10/2011   BUN 18 01/10/2011   CO2 25 01/10/2011   TSH 0.99 02/10/2011   INR 1.19 12/30/2010     Assessment & Plan:  See problem list.  Medications and labs reviewed today.  Time spent with pt/family today 25 minutes, greater than 50% time spent counseling patient on BOO, arthritis & L TKR 01/04/11, cardiac review including last cath 10/2010 and medication review. Also review of prior labs

## 2011-02-24 NOTE — Assessment & Plan Note (Signed)
On simva, tolerating well The current medical regimen is effective;  continue present plan and medications.   

## 2011-03-02 ENCOUNTER — Emergency Department (HOSPITAL_COMMUNITY): Payer: Medicare Other

## 2011-03-02 ENCOUNTER — Emergency Department (HOSPITAL_COMMUNITY)
Admission: EM | Admit: 2011-03-02 | Discharge: 2011-03-02 | Disposition: A | Discharge status: Home / Self Care | Payer: Medicare Other | Attending: Emergency Medicine | Admitting: Emergency Medicine

## 2011-03-02 ENCOUNTER — Telehealth: Payer: Self-pay | Admitting: Physician Assistant

## 2011-03-02 DIAGNOSIS — M1990 Unspecified osteoarthritis, unspecified site: Secondary | ICD-10-CM | POA: Insufficient documentation

## 2011-03-02 DIAGNOSIS — R079 Chest pain, unspecified: Secondary | ICD-10-CM | POA: Insufficient documentation

## 2011-03-02 DIAGNOSIS — R05 Cough: Secondary | ICD-10-CM | POA: Insufficient documentation

## 2011-03-02 DIAGNOSIS — R059 Cough, unspecified: Secondary | ICD-10-CM | POA: Insufficient documentation

## 2011-03-02 DIAGNOSIS — I252 Old myocardial infarction: Secondary | ICD-10-CM | POA: Insufficient documentation

## 2011-03-02 DIAGNOSIS — E785 Hyperlipidemia, unspecified: Secondary | ICD-10-CM | POA: Insufficient documentation

## 2011-03-02 DIAGNOSIS — Z7982 Long term (current) use of aspirin: Secondary | ICD-10-CM | POA: Insufficient documentation

## 2011-03-02 DIAGNOSIS — Z95810 Presence of automatic (implantable) cardiac defibrillator: Secondary | ICD-10-CM | POA: Insufficient documentation

## 2011-03-02 DIAGNOSIS — I10 Essential (primary) hypertension: Secondary | ICD-10-CM | POA: Insufficient documentation

## 2011-03-02 DIAGNOSIS — Z79899 Other long term (current) drug therapy: Secondary | ICD-10-CM | POA: Insufficient documentation

## 2011-03-02 DIAGNOSIS — I509 Heart failure, unspecified: Secondary | ICD-10-CM | POA: Insufficient documentation

## 2011-03-02 LAB — CBC
Hemoglobin: 10.7 g/dL — ABNORMAL LOW (ref 13.0–17.0)
MCH: 29.1 pg (ref 26.0–34.0)
MCV: 87.8 fL (ref 78.0–100.0)
RBC: 3.68 MIL/uL — ABNORMAL LOW (ref 4.22–5.81)

## 2011-03-02 LAB — LIPASE, BLOOD: Lipase: 35 U/L (ref 11–59)

## 2011-03-02 LAB — DIFFERENTIAL
Lymphs Abs: 1.6 10*3/uL (ref 0.7–4.0)
Monocytes Relative: 9 % (ref 3–12)
Neutro Abs: 6.1 10*3/uL (ref 1.7–7.7)
Neutrophils Relative %: 71 % (ref 43–77)

## 2011-03-02 LAB — COMPREHENSIVE METABOLIC PANEL
BUN: 26 mg/dL — ABNORMAL HIGH (ref 6–23)
CO2: 26 mEq/L (ref 19–32)
Calcium: 8.7 mg/dL (ref 8.4–10.5)
Creatinine, Ser: 1.43 mg/dL — ABNORMAL HIGH (ref 0.50–1.35)
Glucose, Bld: 99 mg/dL (ref 70–99)

## 2011-03-02 NOTE — Telephone Encounter (Signed)
Error, did not mean to open note

## 2011-03-05 ENCOUNTER — Other Ambulatory Visit: Payer: Self-pay | Admitting: Cardiovascular Disease

## 2011-03-08 ENCOUNTER — Other Ambulatory Visit: Payer: Self-pay | Admitting: Cardiovascular Disease

## 2011-03-08 ENCOUNTER — Other Ambulatory Visit: Payer: Self-pay | Admitting: Internal Medicine

## 2011-03-08 MED ORDER — LOSARTAN POTASSIUM-HCTZ 50-12.5 MG PO TABS: 1.0000 | ORAL_TABLET | Freq: Every day | ORAL | Status: DC

## 2011-03-11 NOTE — Consult Note (Addendum)
NAMEBRALYN, FOLKERT                ACCOUNT NO.:  [id number]  MEDICAL RECORD NO.:  [id number]  LOCATION:  [facility]                         FACILITY:  [facility]  PHYSICIAN:  Maisie Fus C. Olla Delancey, MD, FACCDATE OF BIRTH:  [birth year]  DATE OF CONSULTATION: DATE OF DISCHARGE:  03/02/2011                                CONSULTATION   PRIMARY CARDIOLOGIST:  Theron Arista C. Eden Emms, MD, [facility]  ELECTROPHYSIOLOGIST:  Doylene Canning. Ladona Ridgel, MD  PRIMARY MEDICAL DOCTOR:  Raenette Rover. Felicity Coyer, MD  CHIEF COMPLAINT:  Chest pain.  HISTORY OF PRESENT ILLNESS:  Mr. Mom is a 23 year old gentleman with history of CAD, ischemic cardiomyopathy, and GERD as well as AFib who is here with chest pain.  This morning, he developed central chest discomfort for several hours occurring at rest.  Nothing made it worse, including getting up and moving around.  He took his typical Imdur as well as 3 sublingual nitroglycerin, which did not make a difference.  He burped and felt great amount of relief.  Once in the ER, the pain resolved spontaneously.  He has never had this kind of discomfort before.  He denied any associated symptoms including shortness of breath, nausea, diaphoresis, palpitations.  He goes to physical therapy several days a week for his knee without chest pain or shortness of breath.  EKG is unremarkable, and cardiac enzymes are negative x1.  PAST MEDICAL HISTORY: 1. CAD with last catheterization in May 2012 showing a totalled RCA     that has collateralized and chronic, with no significant     obstructive left disease. 2. Ischemic cardiomyopathy with EF of 30% by echo in 2008, status post     [employer] AICD in 2008. 3. GERD. 4. Renal failure in July 2012 secondary to hypotension, ARB use, and     urinary retention from BPH. 5. Anemia. 6. Hypertension. 7. Hyperlipidemia. 8. AFib, on Pradaxa. 9. Left knee arthroplasty in July 2012.  MEDICATIONS: 1. Coreg 6.25 mg b.i.d. 2. Pradaxa 150 mg b.i.d. 3. Vitamin  B12. 4. Zocor 40 mg. 5. Senna. 6. Omeprazole 20 mg daily. 7. Imdur 30 mg daily. 8. Hydrocodone/APAP 1-2 tablets b.i.d. p.r.n. 9. Flonase 1-2 sprays nasally daily. 10.Flomax 0.4 mg daily. 11.Avodart 0.5 mg b.i.d. 12.Aspirin 81 mg daily.  ALLERGIES:  PENICILLIN.  SOCIAL HISTORY:  The patient lives in [place] with his wife.  He has many supportive sons and grandsons.  He denies any tobacco or alcohol or drug use.  FAMILY HISTORY:  Positive for CHF.  REVIEW OF SYSTEMS:  All other systems reviewed and otherwise negative.  LABORATORY DATA:  WBC 8.6, hemoglobin 10.7, hematocrit 32.3, platelet count 148.  Sodium 141, potassium 2.7, chloride 108, CO2 of 26, glucose 99, BUN 26, creatinine 1.43.  LFTs were normal.  Cardiac enzymes negative x1.  EKG:  Normal sinus rhythm with nonspecific interventricular conduction delay without acute changes.  RADIOLOGIC STUDIES:  Chest x-ray:  No active cardiopulmonary disease. Probable COPD.  PHYSICAL EXAMINATION:  VITAL SIGNS:  Temperature 98.7, pulse 68, respirations 18, blood pressure 128/55, pulse ox 99% on 2 liters. GENERAL:  This is a pleasant well-appearing elderly gentleman, in no acute distress. HEENT:  Normocephalic, atraumatic with extraocular  movements intact and clear sclerae.  Nares are without discharge. NECK:  Supple without carotid bruits. HEART:  Auscultation of the heart reveals regular rate and rhythm with S1 and S2 without murmurs, rubs, or gallops. LUNGS:  Clear to auscultation bilaterally without wheezes, rales, or rhonchi. ABDOMEN:  Soft, nontender, and nondistended with positive bowel sounds. No rebound or guarding. EXTREMITIES:  Warm and dry without edema.  He has 2+ pedal pulses bilaterally. NEUROLOGIC:  He is alert and oriented x3, responds to questions appropriately with normal affect.  ASSESSMENT/PLAN:  The patient was seen and examined by Dr. Daleen Squibb and myself.  This is a 79-year gentleman with a history of  coronary artery disease, ischemic cardiomyopathy, renal insufficiency and atrial fibrillation who presents with chest pain.  At this time, it is felt to be atypical, likely upper GI related.  We recommend to continue his PPI at home.  There is no clinical evidence of coronary insufficiency and from a cardiac standpoint, he is also well compensated.  He will be discharged on the same medicines that he came in, with the exception of decreasing his Avodart to the typical dose of 0.5 mg daily and to the b.i.d.  There was some concern from pharmacy of whether or not he was taking Pradaxa or Coreg at home, but the patient's wife has confirmed that he is in fact taking these medications at home.  He will continue on this regimen.  He will be given a prescription for sublingual nitroglycerin at discharge.  He will follow up as scheduled with Dr. Eden Emms on April 13, 2011 at 3:30 p.m.  He will be discharged home from the ER in good condition.  Please note he is not on an ACE inhibitor or ARB at this time secondary to renal insufficiency.     Dayna Dunn, P.A.C.   ______________________________ Jesse Sans Daleen Squibb, MD, [facility]    DD/MEDQ  D:  03/02/2011  T:  03/03/2011  Job:  [id number]  cc:   Noralyn Pick. Eden Emms, MD, [facility]  Electronically Signed by Ronie Spies  on 03/23/2011 03:11:43 PM Electronically Signed by Valera Castle MD [facility] on 04/04/2011 01:46:10 PM

## 2011-03-29 LAB — CBC
HCT: 39.9
Hemoglobin: 13.7
MCHC: 34.2
RDW: 13.5

## 2011-03-29 LAB — BASIC METABOLIC PANEL
BUN: 13
CO2: 26
Glucose, Bld: 109 — ABNORMAL HIGH
Potassium: 3.7
Sodium: 143

## 2011-03-29 LAB — PROTIME-INR: INR: 1.2

## 2011-04-01 LAB — COMPREHENSIVE METABOLIC PANEL
ALT: 16
Albumin: 3.7
Alkaline Phosphatase: 88
Potassium: 3.8
Sodium: 140
Total Protein: 6.6

## 2011-04-01 LAB — DIFFERENTIAL
Basophils Relative: 1
Eosinophils Absolute: 0.3
Monocytes Absolute: 0.7
Monocytes Relative: 9

## 2011-04-01 LAB — CBC
HCT: 42.6
Hemoglobin: 14.3
MCHC: 33.6
MCV: 89.7
Platelets: 189
RBC: 4.74
RDW: 14.2 — ABNORMAL HIGH
WBC: 7.7

## 2011-04-01 LAB — APTT
aPTT: 35
aPTT: 38 — ABNORMAL HIGH

## 2011-04-01 LAB — LIPID PANEL
Cholesterol: 162
HDL: 21 — ABNORMAL LOW
LDL Cholesterol: 103 — ABNORMAL HIGH
Total CHOL/HDL Ratio: 7.7
Triglycerides: 190 — ABNORMAL HIGH
VLDL: 38

## 2011-04-01 LAB — PROTIME-INR
INR: 1.1
Prothrombin Time: 14

## 2011-04-01 LAB — BASIC METABOLIC PANEL
BUN: 18
Chloride: 108
Creatinine, Ser: 1.1
GFR calc non Af Amer: >60

## 2011-04-01 LAB — CK TOTAL AND CKMB (NOT AT ARMC)
CK, MB: 4.9 — ABNORMAL HIGH
Relative Index: 1.9
Total CK: 252 — ABNORMAL HIGH

## 2011-04-01 LAB — CARDIAC PANEL(CRET KIN+CKTOT+MB+TROPI)
CK, MB: 3.4
Relative Index: 1.7
Troponin I: 0.02

## 2011-04-01 LAB — TROPONIN I: Troponin I: 0.02

## 2011-04-01 LAB — TSH: TSH: 1.07

## 2011-04-13 ENCOUNTER — Encounter: Payer: Self-pay | Admitting: Cardiovascular Disease

## 2011-04-13 ENCOUNTER — Ambulatory Visit (INDEPENDENT_AMBULATORY_CARE_PROVIDER_SITE_OTHER): Payer: Medicare Other | Admitting: Cardiovascular Disease

## 2011-04-13 VITALS — BP 112/66 | HR 79 | Ht 68.0 in | Wt 192.0 lb

## 2011-04-13 DIAGNOSIS — E7800 Pure hypercholesterolemia, unspecified: Secondary | ICD-10-CM

## 2011-04-13 DIAGNOSIS — R339 Retention of urine, unspecified: Secondary | ICD-10-CM

## 2011-04-13 DIAGNOSIS — I4891 Unspecified atrial fibrillation: Secondary | ICD-10-CM

## 2011-04-13 DIAGNOSIS — I10 Essential (primary) hypertension: Secondary | ICD-10-CM

## 2011-04-13 DIAGNOSIS — I2510 Atherosclerotic heart disease of native coronary artery without angina pectoris: Secondary | ICD-10-CM

## 2011-04-13 DIAGNOSIS — I509 Heart failure, unspecified: Secondary | ICD-10-CM

## 2011-04-13 NOTE — Assessment & Plan Note (Signed)
Euvolemic low sodium diet

## 2011-04-13 NOTE — Patient Instructions (Signed)
Your physician wants you to follow-up in:  6 months. You will receive a reminder letter in the mail two months in advance. If you don't receive a letter, please call our office to schedule the follow-up appointment.   

## 2011-04-13 NOTE — Assessment & Plan Note (Signed)
NSR today.  Given comorbidities avoid coumadin with recent hematuria on Pradaxa

## 2011-04-13 NOTE — Assessment & Plan Note (Signed)
Still with foley in.  F/U Dr Isabel Caprice.  Urodynamics and may need suprapubic catheter

## 2011-04-13 NOTE — Assessment & Plan Note (Signed)
Cholesterol is at goal.  Continue current dose of statin and diet Rx.  No myalgias or side effects.  F/U  LFT's in 6 months. Lab Results  Component Value Date   LDLCALC 53 12/26/2006

## 2011-04-13 NOTE — Progress Notes (Signed)
F/U CAD.  Cath 5/12 to clear him for knee surgery.  Chronic total RCA with collaterals and no significant left sided disease.  Right sided pressures normal.  EF 25-30% and Cr around 1.7.  AICD placed by [facility].  No D/C;s.   Left TKR by Dr Charlann Boxer in July  With subsequent hematuria and renal failure with Cr 3.2.  Was on Pradaxa post op.  Most recent Cr a month ago was 1.4   Seen by TW in ER 03/23/11 thought to be gi in nature and D/C home with PPI.    ROS: Denies fever, malais, weight loss, blurry vision, decreased visual acuity, cough, sputum, SOB, hemoptysis, pleuritic pain, palpitaitons, heartburn, abdominal pain, melena, lower extremity edema, claudication, or rash.  All other systems reviewed and negative  General: Affect appropriate Healthy:  appears stated age HEENT: normal Neck supple with no adenopathy JVP normal no bruits no thyromegaly Lungs clear with no wheezing and good diaphragmatic motion Heart:  S1/S2 no murmur,rub, gallop or click PMI normal Abdomen: benighn, BS positve, no tenderness, no AAA no bruit.  No HSM or HJR Distal pulses intact with no bruits No edema Neuro non-focal Skin warm and dry No muscular weakness   Current Outpatient Prescriptions  Medication Sig Dispense Refill  . aspirin 81 MG tablet Take 81 mg by mouth daily.        . carvedilol (COREG) 6.25 MG tablet TAKE 1 TABLET BY MOUTH TWICE DAILY  60 tablet  6  . cyanocobalamin 100 MCG tablet Take 100 mcg by mouth daily.        . dabigatran (PRADAXA) 150 MG CAPS Take 150 mg by mouth every 12 (twelve) hours.        . dutasteride (AVODART) 0.5 MG capsule Take 0.5 mg by mouth daily.        . fluticasone (FLONASE) 50 MCG/ACT nasal spray Place 2 sprays into the nose as needed.  16 g  1  . HYDROcodone-acetaminophen (NORCO) 5-325 MG per tablet Take 1 tablet by mouth every 6 (six) hours as needed.        . isosorbide mononitrate (IMDUR) 30 MG 24 hr tablet Take 30 mg by mouth daily.        Marland Kitchen losartan-hydrochlorothiazide  (HYZAAR) 50-12.5 MG per tablet Take 1 tablet by mouth daily.  30 tablet  6  . omeprazole (PRILOSEC) 20 MG capsule Take 20 mg by mouth daily.        . simvastatin (ZOCOR) 40 MG tablet TAKE 1 TABLET BY MOUTH EVERY NIGHT AT BEDTIME  30 tablet  11  . solifenacin (VESICARE) 5 MG tablet Take 5 mg by mouth daily.       . Tamsulosin HCl (FLOMAX) 0.4 MG CAPS TAKE ONE CAPSULE BY MOUTH EVERY DAY  30 capsule  6    Allergies  Penicillins  Electrocardiogram:  NSR ICLBBB otherwise normal  Assessment and Plan

## 2011-04-13 NOTE — Assessment & Plan Note (Signed)
Well controlled.  Continue current medications and low sodium Dash type diet.    

## 2011-04-27 ENCOUNTER — Telehealth: Payer: Self-pay | Admitting: Cardiovascular Disease

## 2011-04-27 NOTE — Telephone Encounter (Signed)
He had hematuria and was in NSR last visit  I dont think he is on Pradaxa or coumadin.  Ok for surgery

## 2011-04-27 NOTE — Telephone Encounter (Signed)
Will forward for dr nishan review Jacob [name]  

## 2011-04-27 NOTE — Telephone Encounter (Signed)
New message: [facility] needs clearance for pt to d/c Pradaxa and ASA/  Please fax to [phone].

## 2011-04-29 NOTE — Telephone Encounter (Signed)
Ok to stop pradaxa for surgery

## 2011-04-29 NOTE — Telephone Encounter (Signed)
According to pt he is still currently taking pradaxa Will forward for dr Eden Emms review  Deliah Goody

## 2011-04-30 NOTE — Telephone Encounter (Signed)
Clearance form and note faxed to [facility] Deliah Goody

## 2011-05-03 ENCOUNTER — Other Ambulatory Visit: Payer: Self-pay | Admitting: Urology

## 2011-05-04 ENCOUNTER — Encounter (HOSPITAL_COMMUNITY): Payer: Self-pay | Admitting: Pharmacy Technician

## 2011-05-04 ENCOUNTER — Other Ambulatory Visit: Payer: Self-pay | Admitting: Urology

## 2011-05-07 ENCOUNTER — Encounter: Payer: Medicare Other | Admitting: *Deleted

## 2011-05-13 ENCOUNTER — Encounter: Payer: Self-pay | Admitting: Internal Medicine

## 2011-05-13 ENCOUNTER — Ambulatory Visit (INDEPENDENT_AMBULATORY_CARE_PROVIDER_SITE_OTHER): Payer: Medicare Other | Admitting: *Deleted

## 2011-05-13 ENCOUNTER — Encounter (HOSPITAL_COMMUNITY): Payer: Self-pay

## 2011-05-13 ENCOUNTER — Encounter (HOSPITAL_COMMUNITY)
Admission: RE | Admit: 2011-05-13 | Discharge: 2011-05-13 | Disposition: A | Discharge status: Home / Self Care | Payer: Medicare Other | Source: Ambulatory Visit | Attending: Urology | Admitting: Urology

## 2011-05-13 ENCOUNTER — Other Ambulatory Visit: Payer: Self-pay | Admitting: Internal Medicine

## 2011-05-13 DIAGNOSIS — I4891 Unspecified atrial fibrillation: Secondary | ICD-10-CM

## 2011-05-13 DIAGNOSIS — Z01818 Encounter for other preprocedural examination: Secondary | ICD-10-CM | POA: Insufficient documentation

## 2011-05-13 DIAGNOSIS — I2589 Other forms of chronic ischemic heart disease: Secondary | ICD-10-CM

## 2011-05-13 DIAGNOSIS — Z95810 Presence of automatic (implantable) cardiac defibrillator: Secondary | ICD-10-CM

## 2011-05-13 HISTORY — DX: Atherosclerotic heart disease of native coronary artery without angina pectoris: I25.10

## 2011-05-13 HISTORY — DX: Retention of urine, unspecified: R33.9

## 2011-05-13 HISTORY — DX: Unspecified osteoarthritis, unspecified site: M19.90

## 2011-05-13 HISTORY — DX: Acute myocardial infarction, unspecified: I21.9

## 2011-05-13 HISTORY — DX: Benign prostatic hyperplasia without lower urinary tract symptoms: N40.0

## 2011-05-13 LAB — CBC
HCT: 38.2 % — ABNORMAL LOW (ref 39.0–52.0)
MCH: 28.4 pg (ref 26.0–34.0)
MCHC: 31.9 g/dL (ref 30.0–36.0)
MCV: 89 fL (ref 78.0–100.0)
RDW: 14.9 % (ref 11.5–15.5)
WBC: 10.4 10*3/uL (ref 4.0–10.5)

## 2011-05-13 LAB — BASIC METABOLIC PANEL
BUN: 21 mg/dL (ref 6–23)
Calcium: 8.8 mg/dL (ref 8.4–10.5)
Chloride: 104 mEq/L (ref 96–112)
Creatinine, Ser: 1.21 mg/dL (ref 0.50–1.35)
GFR calc Af Amer: 65 mL/min — ABNORMAL LOW (ref >90)

## 2011-05-13 LAB — SURGICAL PCR SCREEN
MRSA, PCR: NEGATIVE
Staphylococcus aureus: NEGATIVE

## 2011-05-13 NOTE — Patient Instructions (Addendum)
20 EZEQUIAS LARD  05/13/2011   Your procedure is scheduled on: 05-17-11  Report to [facility] at 0630 AM.  Call this number if you have problems the morning of surgery: [phone]   Remember:   Do not eat food:After Midnight.  May have clear liquids:until Midnight .  Clear liquids include soda, tea, black coffee, apple or grape juice, broth.  Take these medicines the morning of surgery with A SIP OF WATER: avodart, carvedilol, fluticasone nasal spary if needed, isosorbide monitrate, norco if needed, omeprazole   Do not wear jewelry, make-up or nail polish.  Do not wear lotions, powders, or perfumes. . .  Do not bring valuables to the hospital.  Contacts, dentures or bridgework may not be worn into surgery.  Leave suitcase in the car. After surgery it may be brought to your room.  For patients admitted to the hospital, checkout time is 11:00 AM the day of discharge.   Patients discharged the day of surgery will not be allowed to drive home.  Name and phone number of your driver: to spend night  Special Instructions: CHG Shower Use Special Wash: 1/2 bottle night before surgery and 1/2 bottle morning of surgery.neck down avoid private area   Please read over the following fact sheets that you were given: MRSA Information Cain Sieve, rn [facility] pre op nurse phone number [phone]

## 2011-05-13 NOTE — Pre-Procedure Instructions (Signed)
Chest xray 03-02-11 in epic under imaging ekg 03-17-11 in epic under media Last device check 02-04-11 in epic under procedures 04-13-11 last office visit note dr Eden Emms in epic under encounters Cardiac cath 11-05-10 copy on chart

## 2011-05-14 ENCOUNTER — Encounter: Payer: Self-pay | Admitting: *Deleted

## 2011-05-14 ENCOUNTER — Encounter (HOSPITAL_COMMUNITY): Payer: Self-pay

## 2011-05-14 LAB — REMOTE ICD DEVICE
AL IMPEDENCE ICD: 590 Ohm
ATRIAL PACING ICD: 10 pct
CHARGE TIME: 9 s
DEV-0020ICD: NEGATIVE
HV IMPEDENCE: 40 Ohm
MODE SWITCH EPISODES: 3
PACEART VT: 0
TOT-0006: 20120823000000
TZAT-0001FASTVT: 2
TZAT-0018FASTVT: NEGATIVE
TZST-0001FASTVT: 4
TZST-0001FASTVT: 5
TZST-0001FASTVT: 6
TZST-0003FASTVT: 26 J
TZST-0003FASTVT: 41 J
TZST-0003FASTVT: 41 J
TZST-0003FASTVT: 41 J

## 2011-05-17 ENCOUNTER — Encounter: Payer: Self-pay | Admitting: Internal Medicine

## 2011-05-17 ENCOUNTER — Ambulatory Visit (INDEPENDENT_AMBULATORY_CARE_PROVIDER_SITE_OTHER): Payer: Medicare Other | Admitting: Internal Medicine

## 2011-05-17 ENCOUNTER — Encounter (HOSPITAL_COMMUNITY): Admission: RE | Payer: Self-pay | Source: Ambulatory Visit

## 2011-05-17 ENCOUNTER — Inpatient Hospital Stay (HOSPITAL_COMMUNITY): Admission: RE | Admit: 2011-05-17 | Payer: Medicare Other | Source: Ambulatory Visit | Admitting: Urology

## 2011-05-17 ENCOUNTER — Telehealth: Payer: Self-pay | Admitting: Cardiovascular Disease

## 2011-05-17 DIAGNOSIS — R079 Chest pain, unspecified: Secondary | ICD-10-CM

## 2011-05-17 DIAGNOSIS — E7800 Pure hypercholesterolemia, unspecified: Secondary | ICD-10-CM

## 2011-05-17 DIAGNOSIS — I509 Heart failure, unspecified: Secondary | ICD-10-CM

## 2011-05-17 DIAGNOSIS — R0602 Shortness of breath: Secondary | ICD-10-CM

## 2011-05-17 DIAGNOSIS — I2510 Atherosclerotic heart disease of native coronary artery without angina pectoris: Secondary | ICD-10-CM

## 2011-05-17 LAB — CBC WITH DIFFERENTIAL/PLATELET
Basophils Absolute: 0 10*3/uL (ref 0.0–0.1)
HCT: 38.2 % — ABNORMAL LOW (ref 39.0–52.0)
Hemoglobin: 13 g/dL (ref 13.0–17.0)
Lymphs Abs: 1.7 10*3/uL (ref 0.7–4.0)
MCV: 87.8 fl (ref 78.0–100.0)
Monocytes Absolute: 0.8 10*3/uL (ref 0.1–1.0)
Monocytes Relative: 9.3 % (ref 3.0–12.0)
Neutro Abs: 5.9 10*3/uL (ref 1.4–7.7)
Platelets: 131 10*3/uL — ABNORMAL LOW (ref 150.0–400.0)
RDW: 15.4 % — ABNORMAL HIGH (ref 11.5–14.6)

## 2011-05-17 LAB — TROPONIN I: Troponin I: <0.3 ng/mL (ref <0.30)

## 2011-05-17 SURGERY — TRANSURETHRAL PROSTATECTOMY WITH GYRUS INSTRUMENTS
Anesthesia: General

## 2011-05-17 NOTE — Progress Notes (Addendum)
Patient ID: Jacob [name], male   DOB: 1932-04-14, 77 y.o.   MRN: [id number] HPI Patient is a 77 year old who is usually followed by P. Nishan.  History of CAD  Cath in May 2012 showed 100% RCA, distal vessel fills via collaterals.  No signif L sided lesions.  LVEF was 25 to 30%.  Patient followed by Ros[facility] Reveal  Has AICD.  Also history of CRI.  Patient was to have urologic   surgery today.   Cancelled Patient's wife reports that he has been very light headed the past few days.  PO intake has been down.  He has been on Cipro and had stomach upset.  Woke at at 2 AM with pressure in chest.  Breathing was OK  Pain 10/10 Took shower.  PRessure continued.   Wife woke up a 5 AM.   DIdn't take NTG for pain. Took 3 NTGs and 4 ASA (81 mg).  Took over the counter stomach medicine  CP went away No SOB through any of it. OVerall ended about 6:30 AM  NOw a little dizzy. No recent trips  No F/C  NO change in bowel movements.  Allergies  Allergen Reactions  . Penicillins Itching, Swelling and Rash    Current Outpatient Prescriptions  Medication Sig Dispense Refill  . aspirin 81 MG tablet Take 81 mg by mouth every morning.       . carvedilol (COREG) 6.25 MG tablet TAKE 1 TABLET BY MOUTH TWICE DAILY  60 tablet  6  . cyanocobalamin 100 MCG tablet Take 100 mcg by mouth daily.       . dabigatran (PRADAXA) 150 MG CAPS Take 150 mg by mouth every 12 (twelve) hours. MED ON HOLD --For last 5 days 05-17-2011      . dutasteride (AVODART) 0.5 MG capsule Take 0.5 mg by mouth every morning.       . fluticasone (FLONASE) 50 MCG/ACT nasal spray Place 2 sprays into the nose daily as needed. Allergies       . HYDROcodone-acetaminophen (NORCO) 5-325 MG per tablet Take 1 tablet by mouth every 6 (six) hours as needed. Pain       . isosorbide mononitrate (IMDUR) 30 MG 24 hr tablet Take 30 mg by mouth every morning.       Marland Kitchen losartan-hydrochlorothiazide (HYZAAR) 50-12.5 MG per tablet Take 1 tablet by mouth every morning.         Marland Kitchen omeprazole (PRILOSEC) 20 MG capsule Take 20 mg by mouth every morning.       . simvastatin (ZOCOR) 40 MG tablet 40 mg every morning.       . solifenacin (VESICARE) 5 MG tablet Take 5 mg by mouth every morning.       . Tamsulosin HCl (FLOMAX) 0.4 MG CAPS TAKE ONE CAPSULE BY MOUTH EVERY DAY  30 capsule  6  . predniSONE (STERAPRED UNI-PAK) 5 MG TABS Take 5 mg by mouth.         Past Medical History  Diagnosis Date  . Systolic dysfunction 12/2006    AICD  . HTN (hypertension)   . History of diverticulitis of colon   . GERD (gastroesophageal reflux disease)   . History of nephrolithiasis   . CHF (congestive heart failure)     echo 2010 LVEF 25%; s/p ICD 12/2006  . Urinary retention     foley since july 2012, foley last changed 1 week ago  . Arthritis   . HLD (hyperlipidemia)   . Barrett's esophagus   .  Coronary artery disease   . Benign prostatic hypertrophy   . Myocardial infarction     pt not sure when    Past Surgical History  Procedure Date  . Cardiac defibrillator placement 12/2006    AICD  . Total knee arthroplasty 01/04/11    L TKA  . Cardiac catheterization 11-05-10  . Total knee arthroplasty     left  January 04, 2011  . Arthroscopic knee surgery 5-6 yrs ago    both knees    Family History  Problem Relation Age of Onset  . Arthritis    . Hypertension    . Heart disease      History   Social History  . Marital Status: Married    Spouse Name: N/A    Number of Children: N/A  . Years of Education: N/A   Occupational History  . retired    Social History Main Topics  . Smoking status: Former Smoker -- 2.0 packs/day for 30 years    Quit date: 06/14/1968  . Smokeless tobacco: Never Used  . Alcohol Use: No  . Drug Use: No  . Sexually Active: Not on file   Other Topics Concern  . Not on file   Social History Narrative  . No narrative on file    Review of Systems:  All systems reviewed.  They are negative to the above problem except as previously  stated.  Vital Signs: BP 80/50  Pulse 84  Ht 5\' 8"  (1.727 m)  Wt 188 lb (85.276 kg)  BMI 28.59 kg/m2  Physical Exam  Patient is in NAD  HEENT:  Normocephalic, atraumatic. EOMI, PERRLA.  Neck: JVP is normal. No thyromegaly. No bruits.  Lungs: clear to auscultation. No rales no wheezes.  Heart: Regular rate and rhythm. Normal S1, S2. No S3.   No significant murmurs. PMI not displaced.  Abdomen:  Supple, nontender. Normal bowel sounds. No masses. No hepatomegaly.  Extremities:   Good distal pulses throughout. No lower extremity edema.  Musculoskeletal :moving all extremities.  Neuro:   alert and oriented x3.  CN II-XII grossly intact.  EKG:  Sinus rhythm  84 bpm.   Assessment and Plan:

## 2011-05-17 NOTE — Telephone Encounter (Signed)
New problem Pt's wife is calling and said he was having chest pain this morning at 5 am and took nitro and and symptoms went away. She said he is having no pain now or sob. She wants to talk to you. He was supposed to have bladder surgery today and now has to be rescheduled.

## 2011-05-17 NOTE — Telephone Encounter (Signed)
Patient's wife called,stating patient had chest pain this morning lasting appox.3 hrs.Stated patient was scheduled to have bladder surgery today,but she cancelled it. Stated patient was taking a shower at 2 am started having chest heaviness and pain.Patient did not wake up wife .Pain lasted until 5 am,when wife woke up she gave him NTG.Stated it took 3 NTG before he had relief.States patient has no chest pain at present.Spoke with DOD Dr. Sloan Leiter advised to add on patient to her afternoon schedule. Patient was given appointment today at 2pm.

## 2011-05-17 NOTE — Patient Instructions (Signed)
Lab work today. We will call you with results  STOP Hyzaar  HOLD Coreg tonight and resume tomorrow.

## 2011-05-18 ENCOUNTER — Telehealth: Payer: Self-pay | Admitting: Cardiovascular Disease

## 2011-05-18 LAB — BASIC METABOLIC PANEL
CO2: 24 mEq/L (ref 19–32)
Calcium: 8.6 mg/dL (ref 8.4–10.5)
Creatinine, Ser: 1.6 mg/dL — ABNORMAL HIGH (ref 0.4–1.5)
GFR: 44.69 mL/min — ABNORMAL LOW (ref >60.00)
Glucose, Bld: 78 mg/dL (ref 70–99)
Sodium: 140 mEq/L (ref 135–145)

## 2011-05-18 NOTE — Assessment & Plan Note (Signed)
Patient with episode of CP this AM.  Eventually relieved after NTG and GI medicine (OTC liquid).  Now asymptomatic. Wife says he has had pain like this before and it has not been cardiac. He has known CAD but only signif in 1 vessel (RCA with distal collaterals)  Has known LV dysfunction.  VOlume status looks good.  I have recomm that patient get labs including CK and Trop.  These should be abnorm with amount/severity of pain if definitely cardiac.  His BP is quite soft.  He has been dizzy.  I wonder if this is part of the problem  Distal RCA bed depends on collaterals. I would hold coreg tonight and then tomorrow resume bid but stop Hyzaar.  All may be related to Cipro with GI upset and poor po intake  Will discuss with  P Nishan.  COnsider myovew before TURP

## 2011-05-18 NOTE — Telephone Encounter (Signed)
Advise wife and she stated she would call PCP in am.    Wife asking if patient will need another clearance from Dr Eden Emms prior to surgery since he was having chest pains yesterday.  Advised would forward to Dr Eden Emms

## 2011-05-18 NOTE — Telephone Encounter (Signed)
If he didn't go to urgent care needs to see primary in am and check labs and UA C &S

## 2011-05-18 NOTE — Assessment & Plan Note (Signed)
Volume status looks pretty good.

## 2011-05-18 NOTE — Telephone Encounter (Signed)
Spoke with patients wife and she stated that patients blood pressure was back up today (systolic above 100)  She did hold his losartan today and will continue to leave off.  Today he is very weak, weaker than yesterday.  Denies any chest pain, only pain is bladder pain.  Did ask if she felt he needed to go to emergency department and she said she was not sure. Advised if worse to go to emergency department.  Did review labs with her, but explained not reviewed by MD.  Explained Dr Tenny Craw and Dr Eden Emms out of office today, but would forward to Dr Eden Emms and his nurse to review tomorrow.

## 2011-05-18 NOTE — Progress Notes (Signed)
Remote icd check  

## 2011-05-18 NOTE — Assessment & Plan Note (Addendum)
Continue zocor 

## 2011-05-18 NOTE — Telephone Encounter (Signed)
New Msg: Pt calling wanting to know pt lab results. Pt wife also wanted to inform nurse/md that pt is still not feeling better, continuing to feel weak. Please return pt wife call to discuss further.

## 2011-05-19 ENCOUNTER — Other Ambulatory Visit: Payer: Self-pay | Admitting: Internal Medicine

## 2011-05-19 ENCOUNTER — Ambulatory Visit (INDEPENDENT_AMBULATORY_CARE_PROVIDER_SITE_OTHER): Payer: Medicare Other | Admitting: Internal Medicine

## 2011-05-19 ENCOUNTER — Other Ambulatory Visit: Payer: Medicare Other

## 2011-05-19 ENCOUNTER — Encounter: Payer: Self-pay | Admitting: Internal Medicine

## 2011-05-19 VITALS — BP 100/52 | HR 87 | Temp 98.8°F | Wt 190.0 lb

## 2011-05-19 DIAGNOSIS — R531 Weakness: Secondary | ICD-10-CM

## 2011-05-19 DIAGNOSIS — N390 Urinary tract infection, site not specified: Secondary | ICD-10-CM

## 2011-05-19 DIAGNOSIS — R3989 Other symptoms and signs involving the genitourinary system: Secondary | ICD-10-CM

## 2011-05-19 DIAGNOSIS — R301 Vesical tenesmus: Secondary | ICD-10-CM

## 2011-05-19 DIAGNOSIS — R5381 Other malaise: Secondary | ICD-10-CM

## 2011-05-19 DIAGNOSIS — I959 Hypotension, unspecified: Secondary | ICD-10-CM

## 2011-05-19 LAB — URINALYSIS, ROUTINE W REFLEX MICROSCOPIC
Nitrite: POSITIVE
Specific Gravity, Urine: 1.02 (ref 1.000–1.030)
Urobilinogen, UA: 0.2 (ref 0.0–1.0)

## 2011-05-19 MED ORDER — SULFAMETHOXAZOLE-TRIMETHOPRIM 800-160 MG PO TABS: 1.0000 | ORAL_TABLET | Freq: Two times a day (BID) | ORAL | Status: DC

## 2011-05-19 NOTE — Progress Notes (Signed)
  Subjective:    Patient ID: Jacob [name], male    DOB: [birth year], 77 y.o.   MRN: [id number]  HPI  complains of weakness associated with bladder spasm and pain - still with indwelling uro cath Planning TURP - sched earlier this week but cancelled due to CP-  Past Medical History  Diagnosis Date  . Systolic dysfunction 12/2006    AICD  . HTN (hypertension)   . History of diverticulitis of colon   . GERD (gastroesophageal reflux disease)   . History of nephrolithiasis   . CHF (congestive heart failure)     echo 2010 LVEF 25%; s/p ICD 12/2006  . Urinary retention     foley since july 2012, foley last changed 1 week ago  . Arthritis   . HLD (hyperlipidemia)   . Barrett's esophagus   . Coronary artery disease   . Benign prostatic hypertrophy   . Myocardial infarction     pt not sure when    Review of Systems  Constitutional: Positive for fatigue. Negative for fever and chills.  Respiratory: Negative for cough and shortness of breath.   Cardiovascular: Negative for chest pain.  Genitourinary: Negative for flank pain and testicular pain.       Objective:   Physical Exam BP 100/52  Pulse 87  Temp(Src) 98.8 F (37.1 C) (Oral)  Wt 190 lb (86.183 kg)  SpO2 98%. Wt Readings from Last 3 Encounters:  05/19/11 190 lb (86.183 kg)  05/17/11 188 lb (85.276 kg)  05/13/11 194 lb 9.6 oz (88.27 kg)   Constitutional:  He appears well-developed and well-nourished. No distress. Wife and son at side Neck: Normal range of motion. Neck supple. No JVD present. No thyromegaly present.  Cardiovascular: Normal rate, regular rhythm and normal heart sounds.  No murmur heard. no BLE edema Pulmonary/Chest: Effort normal and breath sounds normal. No respiratory distress. no wheezes.  Abdominal: Soft. Bowel sounds are normal. Patient exhibits no distension. There is no tenderness. Neurological: he is alert and oriented to person, place, and time. No cranial nerve deficit. Coordination normal.    Psychiatric: he has a normal mood and affect. behavior is normal. Judgment and thought content normal.    Lab Results  Component Value Date   WBC 8.5 05/17/2011   HGB 13.0 05/17/2011   HCT 38.2* 05/17/2011   PLT 131.0* 05/17/2011   GLUCOSE 78 05/17/2011   CHOL 131 08/26/2010   TRIG 219.0* 08/26/2010   HDL 29.30* 08/26/2010   LDLDIRECT 54.3 08/26/2010   LDLCALC 53 12/26/2006   ALT 9 03/02/2011   AST 12 03/02/2011   NA 140 05/17/2011   K 4.0 05/17/2011   CL 102 05/17/2011   CREATININE 1.6* 05/17/2011   BUN 20 05/17/2011   CO2 24 05/17/2011   TSH 0.99 02/10/2011   INR 1.19 12/30/2010       Assessment & Plan:  Weakness - suspect multifactorial:  UTI - just completed Cipro but unimproved - Collect UA and Ucx - start empiric Septra (allg to PCN)  Bladder pain and spasm -likely related to UTI and indwelling Foley - treatment as above and followup with urology as planned for TURP procedure when medically stable  Hypotension, resolved with medication changes by cardiology earlier this week. No additional cardiac or BP med changes today

## 2011-05-19 NOTE — Patient Instructions (Signed)
It was good to see you today. We will send urine sample for urinalysis and culture today In the meanwhile, start Septra antibiotics for presumed bladder infection and weakness -Your prescription(s) have been submitted to your pharmacy. Please take as directed and contact our office if you believe you are having problem(s) with the medication(s). You are medically clear to proceed with scheduling of surgery when time permits

## 2011-05-20 NOTE — Telephone Encounter (Signed)
Discussed with Jacob [name] and she will discuss with Dr. Eden Emms

## 2011-05-20 NOTE — Telephone Encounter (Signed)
DISCUSSED WITH  DR Eden Emms  PT DOES NOT NEED F/U  HERE PRIOR TO SURGERY. PER DR NISHAN  WAS TO SEE PMD  AND IF CLEARED FROM THEIR STANDPOINT   MAY PROCEED./CY PT'S WIFE AWARE OF ABOVE./CY

## 2011-05-22 LAB — URINE CULTURE: Colony Count: >=100000

## 2011-05-24 ENCOUNTER — Other Ambulatory Visit: Payer: Self-pay | Admitting: Internal Medicine

## 2011-05-24 MED ORDER — NITROFURANTOIN MONOHYD MACRO 100 MG PO CAPS: 100.0000 mg | ORAL_CAPSULE | Freq: Two times a day (BID) | ORAL | Status: AC

## 2011-05-26 ENCOUNTER — Ambulatory Visit (INDEPENDENT_AMBULATORY_CARE_PROVIDER_SITE_OTHER): Payer: Medicare Other | Admitting: Internal Medicine

## 2011-05-26 ENCOUNTER — Encounter: Payer: Self-pay | Admitting: Internal Medicine

## 2011-05-26 DIAGNOSIS — I10 Essential (primary) hypertension: Secondary | ICD-10-CM

## 2011-05-26 DIAGNOSIS — E7800 Pure hypercholesterolemia, unspecified: Secondary | ICD-10-CM

## 2011-05-26 DIAGNOSIS — R339 Retention of urine, unspecified: Secondary | ICD-10-CM

## 2011-05-26 MED ORDER — FLUTICASONE PROPIONATE 50 MCG/ACT NA SUSP: 2.0000 | Freq: Every day | NASAL | Status: DC | PRN

## 2011-05-26 NOTE — Assessment & Plan Note (Signed)
On simva, tolerating well The current medical regimen is effective;  continue present plan and medications.

## 2011-05-26 NOTE — Assessment & Plan Note (Signed)
The current medical regimen is effective;  continue present plan and medications. BP Readings from Last 3 Encounters:  05/26/11 102/60  05/19/11 100/52  05/17/11 80/50

## 2011-05-26 NOTE — Assessment & Plan Note (Signed)
BOO 12/2010>> continued indwelling foley with leg bag Complicated by spasms and UTI - on Macrobid since last week (last OV reviewed) Planning repeat uro surgery 06/14/11

## 2011-05-26 NOTE — Progress Notes (Signed)
Subjective:    Patient ID: Jacob [name], male    DOB: 02-25-1932, 77 y.o.   MRN: [id number]  HPI  Here for follow up - reviewed chronic med issues:  Diffuse OA - both shoulders, right>left, knees and hips  s/p fall accidental on left shoulder fall 2011-saw ortho for same - had steroid injection with temp relief  S/p 12/2010 L TKR with orthoaafter cleared by cards with 11/05/10 cath - ?? future R TKR if L ok  CHF/CAD - follows with [facility] for same - [facility]an/[facility]  reports compliance with ongoing medical treatment and no changes in medication dose or frequency.  denies adverse side effects related to current therapy. no cp, SOB or edema -   HTN - reports compliance with ongoing medical treatment and no changes in medication dose or frequency. denies adverse side effects related to current therapy. no HA or vision changes   dyslipidemia - reports compliance with ongoing medical treatment and no changes in medication dose or frequency. denies adverse side effects related to current therapy. no GI or muscle c/o   BPH - ongoing problems with urinary retention from BOO - follows with dr. Isabel Caprice -uro  - BOO 01/2011 requiring indwelling cath since >> reeval for removal next week - on abx for UTI   Past Medical History  Diagnosis Date  . Systolic dysfunction 12/2006    AICD  . HTN (hypertension)   . History of diverticulitis of colon   . GERD (gastroesophageal reflux disease)   . History of nephrolithiasis   . CHF (congestive heart failure)     echo 2010 LVEF 25%; s/p ICD 12/2006  . Urinary retention     foley since july 2012, foley last changed 1 week ago  . Arthritis   . HLD (hyperlipidemia)   . Barrett's esophagus   . Coronary artery disease   . Benign prostatic hypertrophy   . Myocardial infarction     pt not sure when    Review of Systems  Cardiovascular: Negative for chest pain and leg swelling.  Musculoskeletal: Positive for gait problem.  Neurological: Negative  for weakness and headaches.       Objective:   Physical Exam  BP 102/60  Pulse 83  Temp(Src) 97.9 F (36.6 C) (Oral)  Wt 190 lb 1.9 oz (86.238 kg)  SpO2 97% Wt Readings from Last 3 Encounters:  05/26/11 190 lb 1.9 oz (86.238 kg)  05/19/11 190 lb (86.183 kg)  05/17/11 188 lb (85.276 kg)   Constitutional:  overweight. appears well-developed and well-nourished. No distress. Wife at side Neck: Normal range of motion. Neck supple. No JVD present. No thyromegaly present.  Cardiovascular: Normal rate, regular rhythm and normal heart sounds.  No murmur heard. no BLE edema Pulmonary/Chest: Effort normal and breath sounds normal. No respiratory distress. no wheezes.  Abdominal: Soft. Bowel sounds are normal. Patient exhibits no distension. There is no tenderness.  foley leg bag on RLE Psychiatric: he has a normal mood and affect. behavior is normal. Judgment and thought content normal.       Lab Results  Component Value Date   WBC 8.5 05/17/2011   HGB 13.0 05/17/2011   HCT 38.2* 05/17/2011   PLT 131.0* 05/17/2011   CHOL 131 08/26/2010   TRIG 219.0* 08/26/2010   HDL 29.30* 08/26/2010   LDLDIRECT 54.3 08/26/2010   ALT 9 03/02/2011   AST 12 03/02/2011   NA 140 05/17/2011   K 4.0 05/17/2011   CL 102 05/17/2011  CREATININE 1.6* 05/17/2011   BUN 20 05/17/2011   CO2 24 05/17/2011   TSH 0.99 02/10/2011   INR 1.19 12/30/2010     Assessment & Plan:  See problem list. Medications and labs reviewed today.  Time spent with pt/family today 25 minutes, greater than 50% time spent counseling patient on BOO, UTI and medication review. Also review of prior labs

## 2011-05-26 NOTE — Patient Instructions (Signed)
It was good to see you today. We have reviewed your prior records including labs and tests today Medications reviewed, no changes at this time. Good luck plans for surgery 12/31 Please schedule followup in 3-4 months to recheck cholesterol, call sooner if problems.

## 2011-05-31 ENCOUNTER — Encounter (HOSPITAL_COMMUNITY): Payer: Self-pay | Admitting: Pharmacy Technician

## 2011-06-09 ENCOUNTER — Encounter (HOSPITAL_COMMUNITY): Payer: Self-pay

## 2011-06-09 ENCOUNTER — Encounter (HOSPITAL_COMMUNITY)
Admission: RE | Admit: 2011-06-09 | Discharge: 2011-06-09 | Disposition: A | Discharge status: Home / Self Care | Payer: Medicare Other | Source: Ambulatory Visit | Attending: Urology | Admitting: Urology

## 2011-06-09 LAB — BASIC METABOLIC PANEL
BUN: 11 mg/dL (ref 6–23)
Calcium: 9.3 mg/dL (ref 8.4–10.5)
Creatinine, Ser: 1.31 mg/dL (ref 0.50–1.35)
GFR calc non Af Amer: 51 mL/min — ABNORMAL LOW (ref >90)
Glucose, Bld: 111 mg/dL — ABNORMAL HIGH (ref 70–99)

## 2011-06-09 LAB — CBC
HCT: 37.8 % — ABNORMAL LOW (ref 39.0–52.0)
Hemoglobin: 12 g/dL — ABNORMAL LOW (ref 13.0–17.0)
MCH: 28.1 pg (ref 26.0–34.0)
MCHC: 31.7 g/dL (ref 30.0–36.0)
MCV: 88.5 fL (ref 78.0–100.0)
RDW: 14.2 % (ref 11.5–15.5)

## 2011-06-09 NOTE — Patient Instructions (Addendum)
20 Jacob [name]  06/09/2011   Your procedure is scheduled on:  06/14/11  Report to [facility] at 7:00 AM.  Call this number if you have problems the morning of surgery: [phone]   Remember:   Do not eat food:After Midnight.  May have clear liquids:until Midnight .  Clear liquids include soda, tea, black coffee, apple or grape juice, broth.  Take these medicines the morning of surgery with A SIP OF WATER:COREG /AVODART / NORCO / IMDUR / PRILOSEC / ZOCOR /             VESICARE    Do not wear jewelry, make-up or nail polish.  Do not wear lotions, powders, or perfumes. You may wear deodorant.  Do not shave 48 hours prior to surgery.  Do not bring valuables to the hospital.  Contacts, dentures or bridgework may not be worn into surgery.  Leave suitcase in the car. After surgery it may be brought to your room.  For patients admitted to the hospital, checkout time is 11:00 AM the day of discharge.   Patients discharged the day of surgery will not be allowed to drive home.  Name and phone number of your driver:   Special Instructions: CHG Shower Use Special Wash: 1/2 bottle night before surgery and 1/2 bottle morning of surgery.   Please read over the following fact sheets that you were given: MRSA Information

## 2011-06-10 ENCOUNTER — Other Ambulatory Visit: Payer: Self-pay | Admitting: Internal Medicine

## 2011-06-10 ENCOUNTER — Other Ambulatory Visit: Payer: Self-pay | Admitting: *Deleted

## 2011-06-10 MED ORDER — CARVEDILOL 6.25 MG PO TABS: 6.2500 mg | ORAL_TABLET | Freq: Two times a day (BID) | ORAL | Status: DC

## 2011-06-10 MED ORDER — DABIGATRAN ETEXILATE MESYLATE 150 MG PO CAPS: 150.0000 mg | ORAL_CAPSULE | Freq: Two times a day (BID) | ORAL | Status: DC

## 2011-06-10 MED ORDER — OMEPRAZOLE 20 MG PO CPDR: 20.0000 mg | DELAYED_RELEASE_CAPSULE | Freq: Every day | ORAL | Status: DC

## 2011-06-12 NOTE — H&P (Signed)
History of Present Illness   Mr. Shinault  has had ongoing urinary retention for 3-4 months. He has an indwelling catheter that he is tolerating reasonably well. He has failed multiple voiding trials despite Avodart and double dose Flomax. 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 Recent urodynamics did demonstrate some bladder instability. The patient had a reduced functional capacity. He was able to generate a weak detrusor contraction but was unable to void. A catheter was reinserted. The patient is here to discuss these options. Of note, the patient's recent urine culture was positive for pseudomonas, which was interestingly fairly pan sensitive. We have elected not to treat him until the catheter is out or procedure is performed. The patient did recently had total knee surgery and was given cardiac clearance for that. Of note, he is on aspirin and Pradaxa. He presents now for TURP/ Possible con-current SP tube. Surgery about 1 month ago canceled secondary to chest pain that turned out to be non-cardiac.   Past Medical History Problems  1. History of  Acute Myocardial Infarction V12.59 2. History of  Acute Renal Failure 584.9 3. History of  Anemia 285.9  Surgical History Problems  1. History of  Cardioverter-defibrillator Pulse Generator 2. History of  Cataract Surgery Bilateral 3. History of  Knee Replacement Left  Current Meds 1. Aplisol 5 UNIT/0.1ML Intradermal Solution; Therapy: (Recorded:09Aug2012) to 2. Aspirin 81 MG Oral Tablet Chewable; Therapy: (Recorded:12Aug2008) to 3. Avodart 0.5 MG Oral Capsule; TAKE 1 CAPSULE Daily; Therapy: 28Oct2009 to (Last  Rx:27Jun2012)  Requested for: 16Aug2012; Status: ACTIVE - Renewal Denied 4. Carvedilol 6.25 MG Oral Tablet; Therapy: 28Aug2010 to 5. Fish Oil CAPS; Therapy: (Recorded:29Oct2010) to 6. Fluticasone Propionate 50 MCG/ACT Nasal Suspension; Therapy: 13Jun2012 to 7. Isosorbide Mononitrate ER 30 MG Oral Tablet  Extended Release 24 Hour; Therapy: 21Feb2012 to 8. Isosorbide Mononitrate TABS; 30mg ; Therapy: (Recorded:08Sep2009) to 9. Norco 5-325 MG Oral Tablet; Therapy: (Recorded:09Aug2012) to 10. Omeprazole 20 MG Oral Capsule Delayed Release; Therapy: (Recorded:29Oct2010) to 11. Pradaxa 150 MG Oral Capsule; Therapy: 15Nov2011 to 12. Senna TABS; Therapy: (Recorded:09Aug2012) to 13. Simvastatin 40 MG Oral Tablet; Therapy: (Recorded:12Aug2008) to 14. Tamsulosin HCl 0.4 MG Oral Capsule; TAKE TWO CAPSULE BY MOUTH DAILY; Therapy:   04Oct2010 to (Evaluate:16Oct2012)  Requested for: 19Aug2012; Last Rx:17Aug2012 15. Vitamin B12 TABS; Therapy: (Recorded:29Oct2010) to  Allergies Medication  1. Penicillins  Social History Problems  1. Caffeine Use 3 QD 2. Death In The Family Father 71yrs 3. Death In The Family Mother 8yrs 4. Former Smoker 1 PPD for 41yrs Denied  5. History of  Alcohol Use  Review of Systems Genitourinary, constitutional, skin, eye, otolaryngeal, hematologic/lymphatic, cardiovascular, pulmonary, endocrine, musculoskeletal, gastrointestinal, neurological and psychiatric system(s) were reviewed and pertinent findings if present are noted.  Genitourinary: incomplete emptying of bladder.    Vitals Vital Signs [Data Includes: Last 1 Day]  20Sep2012 01:25PM  BMI Calculated: 26.92 BSA Calculated: 2.03 Weight: 188 lb  Blood Pressure: 137 / 68 Temperature: 98.5 F Heart Rate: 83  Physical Exam Constitutional: Well nourished and well developed . No acute distress.  ENT:. The ears and nose are normal in appearance.  Neck: The appearance of the neck is normal and no neck mass is present.  Pulmonary: No respiratory distress and normal respiratory rhythm and effort.  Cardiovascular:. No peripheral edema.  Genitourinary: Examination of the penis demonstrates an indwelling catheter, but no lesions and a normal meatus. The scrotum is normal in appearance.  Skin: Normal skin turgor, no  visible rash and no  visible skin lesions.  Neuro/Psych:. Mood and affect are appropriate.    Discussion/Summary   Mr. Russ has ongoing urinary retention. Today's visit was one of discussion. We spent approximately 20-25 minutes of face-to-face time discussing his situation. 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 It does not appear he is going to be responding to medical therapy. At this point I think it would be reasonable to proceed with a saline Gyrus TURP. I think that the safest way to proceed would be to place a concurrent suprapubic tube and that way if Mr. Tagg is still unable to void, we will have an alternative strategy to urethral catheterization. It is difficult to predict the likelihood that he will void successfully, but hopefully will have a reasonable chance. We will need Dr. Eden Emms to okay holding the Pradaxa and aspirin for an appropriate period of time both preoperatively as well as postoperatively. I suspect he will not need any additional cardiac evaluation, since that apparently was done leading up to his recent knee surgery. He will need antibiotic coverage at the time of surgery and I will plan on giving him a dose of Fortaz preoperatively. I have also given him a prescription for Cipro to begin 4-5 days prior to the procedure.

## 2011-06-14 ENCOUNTER — Encounter (HOSPITAL_COMMUNITY): Payer: Self-pay | Admitting: *Deleted

## 2011-06-14 ENCOUNTER — Encounter (HOSPITAL_COMMUNITY): Payer: Self-pay | Admitting: Certified Registered Nurse Anesthetist

## 2011-06-14 ENCOUNTER — Ambulatory Visit (HOSPITAL_COMMUNITY)
Admission: RE | Admit: 2011-06-14 | Discharge: 2011-06-16 | Disposition: A | Discharge status: Home / Self Care | Payer: Medicare Other | Source: Ambulatory Visit | Attending: Urology | Admitting: Urology

## 2011-06-14 ENCOUNTER — Inpatient Hospital Stay (HOSPITAL_COMMUNITY): Payer: Medicare Other | Admitting: Certified Registered Nurse Anesthetist

## 2011-06-14 ENCOUNTER — Encounter (HOSPITAL_COMMUNITY)
Admission: RE | Disposition: A | Discharge status: Home / Self Care | Payer: Self-pay | Source: Ambulatory Visit | Attending: Urology

## 2011-06-14 ENCOUNTER — Other Ambulatory Visit: Payer: Self-pay | Admitting: Urology

## 2011-06-14 DIAGNOSIS — N138 Other obstructive and reflux uropathy: Principal | ICD-10-CM | POA: Insufficient documentation

## 2011-06-14 DIAGNOSIS — I252 Old myocardial infarction: Secondary | ICD-10-CM | POA: Insufficient documentation

## 2011-06-14 DIAGNOSIS — Z79899 Other long term (current) drug therapy: Secondary | ICD-10-CM | POA: Insufficient documentation

## 2011-06-14 DIAGNOSIS — Z01812 Encounter for preprocedural laboratory examination: Secondary | ICD-10-CM | POA: Insufficient documentation

## 2011-06-14 DIAGNOSIS — N319 Neuromuscular dysfunction of bladder, unspecified: Secondary | ICD-10-CM | POA: Insufficient documentation

## 2011-06-14 DIAGNOSIS — R339 Retention of urine, unspecified: Secondary | ICD-10-CM | POA: Insufficient documentation

## 2011-06-14 DIAGNOSIS — N401 Enlarged prostate with lower urinary tract symptoms: Principal | ICD-10-CM | POA: Insufficient documentation

## 2011-06-14 DIAGNOSIS — D649 Anemia, unspecified: Secondary | ICD-10-CM | POA: Insufficient documentation

## 2011-06-14 DIAGNOSIS — Z7982 Long term (current) use of aspirin: Secondary | ICD-10-CM | POA: Insufficient documentation

## 2011-06-14 HISTORY — PX: TRANSURETHRAL RESECTION OF PROSTATE: SHX73

## 2011-06-14 SURGERY — TURP (TRANSURETHRAL RESECTION OF PROSTATE)
Anesthesia: General | Site: Urethra | Wound class: Clean Contaminated

## 2011-06-14 MED ORDER — MORPHINE SULFATE 2 MG/ML IJ SOLN: 2.0000 mg | INTRAMUSCULAR | Status: DC | PRN

## 2011-06-14 MED ORDER — DUTASTERIDE 0.5 MG PO CAPS
0.5000 mg | ORAL_CAPSULE | ORAL | Status: DC
  Administered 2011-06-15 – 2011-06-16 (×2): 0.5 mg via ORAL
  Filled 2011-06-14 (×6): qty 1

## 2011-06-14 MED ORDER — PROMETHAZINE HCL 25 MG/ML IJ SOLN: 6.2500 mg | INTRAMUSCULAR | Status: DC | PRN

## 2011-06-14 MED ORDER — LACTATED RINGERS IV SOLN: INTRAVENOUS | Status: DC

## 2011-06-14 MED ORDER — TAMSULOSIN HCL 0.4 MG PO CAPS
0.4000 mg | ORAL_CAPSULE | Freq: Every day | ORAL | Status: DC
  Administered 2011-06-15 – 2011-06-16 (×2): 0.4 mg via ORAL
  Filled 2011-06-14 (×3): qty 1

## 2011-06-14 MED ORDER — PROPOFOL 10 MG/ML IV EMUL
INTRAVENOUS | Status: DC | PRN
  Administered 2011-06-14: 200 mg via INTRAVENOUS

## 2011-06-14 MED ORDER — DARIFENACIN HYDROBROMIDE ER 7.5 MG PO TB24
7.5000 mg | ORAL_TABLET | Freq: Every day | ORAL | Status: DC
  Administered 2011-06-15 – 2011-06-16 (×2): 7.5 mg via ORAL
  Filled 2011-06-14 (×3): qty 1

## 2011-06-14 MED ORDER — INDIGOTINDISULFONATE SODIUM 8 MG/ML IJ SOLN
INTRAMUSCULAR | Status: DC | PRN
  Administered 2011-06-14: 5 mL via INTRAVENOUS

## 2011-06-14 MED ORDER — ISOSORBIDE MONONITRATE ER 30 MG PO TB24
30.0000 mg | ORAL_TABLET | ORAL | Status: DC
  Administered 2011-06-15 – 2011-06-16 (×2): 30 mg via ORAL
  Filled 2011-06-14 (×6): qty 1

## 2011-06-14 MED ORDER — CEFAZOLIN SODIUM-DEXTROSE 2-3 GM-% IV SOLR
2.0000 g | Freq: Once | INTRAVENOUS | Status: AC
  Administered 2011-06-14: 2 g via INTRAVENOUS

## 2011-06-14 MED ORDER — CEFAZOLIN SODIUM 1-5 GM-% IV SOLN
1.0000 g | Freq: Three times a day (TID) | INTRAVENOUS | Status: DC
  Administered 2011-06-14 – 2011-06-16 (×6): 1 g via INTRAVENOUS
  Filled 2011-06-14 (×12): qty 50

## 2011-06-14 MED ORDER — FENTANYL CITRATE 0.05 MG/ML IJ SOLN
25.0000 ug | INTRAMUSCULAR | Status: DC | PRN
  Administered 2011-06-14: 25 ug via INTRAVENOUS
  Administered 2011-06-14: 50 ug via INTRAVENOUS
  Administered 2011-06-14: 25 ug via INTRAVENOUS

## 2011-06-14 MED ORDER — KCL IN DEXTROSE-NACL 20-5-0.45 MEQ/L-%-% IV SOLN
INTRAVENOUS | Status: DC
  Administered 2011-06-14: 75 mL/h via INTRAVENOUS
  Administered 2011-06-15: 01:00:00 via INTRAVENOUS
  Filled 2011-06-14 (×6): qty 1000

## 2011-06-14 MED ORDER — ONDANSETRON HCL 4 MG/2ML IJ SOLN: 4.0000 mg | INTRAMUSCULAR | Status: DC | PRN

## 2011-06-14 MED ORDER — MEPERIDINE HCL 50 MG/ML IJ SOLN: 6.2500 mg | INTRAMUSCULAR | Status: DC | PRN

## 2011-06-14 MED ORDER — LIDOCAINE HCL (CARDIAC) 20 MG/ML IV SOLN
INTRAVENOUS | Status: DC | PRN
  Administered 2011-06-14: 70 mg via INTRAVENOUS

## 2011-06-14 MED ORDER — FENTANYL CITRATE 0.05 MG/ML IJ SOLN
INTRAMUSCULAR | Status: DC | PRN
  Administered 2011-06-14 (×2): 25 ug via INTRAVENOUS
  Administered 2011-06-14 (×2): 50 ug via INTRAVENOUS
  Administered 2011-06-14 (×2): 25 ug via INTRAVENOUS

## 2011-06-14 MED ORDER — LACTATED RINGERS IV SOLN
INTRAVENOUS | Status: DC | PRN
  Administered 2011-06-14: 09:00:00 via INTRAVENOUS

## 2011-06-14 MED ORDER — HYDROCODONE-ACETAMINOPHEN 5-325 MG PO TABS
1.0000 | ORAL_TABLET | Freq: Four times a day (QID) | ORAL | Status: DC | PRN
  Administered 2011-06-14 – 2011-06-15 (×3): 2 via ORAL
  Administered 2011-06-16: 1 via ORAL
  Filled 2011-06-14 (×2): qty 2
  Filled 2011-06-14: qty 1
  Filled 2011-06-14: qty 2

## 2011-06-14 MED ORDER — GENTAMICIN SULFATE 40 MG/ML IJ SOLN
430.0000 mg | Freq: Once | INTRAMUSCULAR | Status: DC
  Filled 2011-06-14: qty 10.75

## 2011-06-14 MED ORDER — GENTAMICIN SULFATE 40 MG/ML IJ SOLN
120.0000 mg | INTRAVENOUS | Status: AC
  Administered 2011-06-14: 120 mg via INTRAVENOUS
  Filled 2011-06-14: qty 3

## 2011-06-14 MED ORDER — LIDOCAINE HCL 2 % EX GEL
CUTANEOUS | Status: DC | PRN
  Administered 2011-06-14: 1

## 2011-06-14 MED ORDER — CARVEDILOL 6.25 MG PO TABS
6.2500 mg | ORAL_TABLET | Freq: Two times a day (BID) | ORAL | Status: DC
  Administered 2011-06-14 – 2011-06-16 (×4): 6.25 mg via ORAL
  Filled 2011-06-14 (×5): qty 1

## 2011-06-14 MED ORDER — GENTAMICIN SULFATE 40 MG/ML IJ SOLN
530.0000 mg | Freq: Once | INTRAVENOUS | Status: AC
  Administered 2011-06-14: 530 mg via INTRAVENOUS
  Filled 2011-06-14: qty 13.25

## 2011-06-14 MED ORDER — EPHEDRINE SULFATE 50 MG/ML IJ SOLN
INTRAMUSCULAR | Status: DC | PRN
  Administered 2011-06-14 (×2): 5 mg via INTRAVENOUS

## 2011-06-14 MED ORDER — SIMVASTATIN 40 MG PO TABS
40.0000 mg | ORAL_TABLET | Freq: Every day | ORAL | Status: DC
  Administered 2011-06-14 – 2011-06-15 (×2): 40 mg via ORAL
  Filled 2011-06-14 (×3): qty 1

## 2011-06-14 MED ORDER — SODIUM CHLORIDE 0.9 % IR SOLN
Status: DC | PRN
  Administered 2011-06-14: 12000 mL

## 2011-06-14 MED ORDER — ONDANSETRON HCL 4 MG/2ML IJ SOLN
INTRAMUSCULAR | Status: DC | PRN
  Administered 2011-06-14: 4 mg via INTRAVENOUS

## 2011-06-14 MED ORDER — CEFTAZIDIME 1 G IJ SOLR: 1.0000 g | Freq: Once | INTRAMUSCULAR | Status: DC

## 2011-06-14 MED ORDER — PANTOPRAZOLE SODIUM 40 MG PO TBEC
40.0000 mg | DELAYED_RELEASE_TABLET | Freq: Every day | ORAL | Status: DC
  Administered 2011-06-15 – 2011-06-16 (×3): 40 mg via ORAL
  Filled 2011-06-14 (×2): qty 1

## 2011-06-14 MED ORDER — DOCUSATE SODIUM 100 MG PO CAPS
100.0000 mg | ORAL_CAPSULE | Freq: Two times a day (BID) | ORAL | Status: DC
  Administered 2011-06-14 – 2011-06-16 (×5): 100 mg via ORAL
  Filled 2011-06-14 (×6): qty 1

## 2011-06-14 SURGICAL SUPPLY — 37 items
BAG URINE DRAINAGE (UROLOGICAL SUPPLIES) ×4 IMPLANT
BAG URINE LEG 500ML (DRAIN) ×3 IMPLANT
BAG URO CATCHER STRL LF (DRAPE) ×3 IMPLANT
BLADE SURG 15 STRL LF DISP TIS (BLADE) ×2 IMPLANT
BLADE SURG 15 STRL SS (BLADE) ×3
CATH FOLEY 3WAY 30CC 24FR (CATHETERS)
CATH URTH STD 24FR FL 3W 2 (CATHETERS) IMPLANT
CLOTH BEACON ORANGE TIMEOUT ST (SAFETY) ×5 IMPLANT
COVER SURGICAL LIGHT HANDLE (MISCELLANEOUS) ×4 IMPLANT
DRAPE CAMERA CLOSED 9X96 (DRAPES) ×3 IMPLANT
ELECT LOOP MED HF 24F 12D CBL (CLIP) ×1 IMPLANT
ELECT REM PT RETURN 9FT ADLT (ELECTROSURGICAL)
ELECTRODE KNIFE URO 27FR PED (UROLOGICAL SUPPLIES) ×2 IMPLANT
ELECTRODE REM PT RTRN 9FT ADLT (ELECTROSURGICAL) ×2 IMPLANT
GLOVE BIOGEL M STRL SZ7.5 (GLOVE) ×3 IMPLANT
GLOVE SURG SS PI 8.0 STRL IVOR (GLOVE) ×2 IMPLANT
GOWN PREVENTION PLUS XLARGE (GOWN DISPOSABLE) ×2 IMPLANT
GOWN STRL NON-REIN LRG LVL3 (GOWN DISPOSABLE) ×3 IMPLANT
GOWN STRL REIN XL XLG (GOWN DISPOSABLE) ×5 IMPLANT
HOLDER FOLEY CATH W/STRAP (MISCELLANEOUS) IMPLANT
JUMPSUIT BLUE BOOT COVER DISP (PROTECTIVE WEAR) IMPLANT
KIT ASPIRATION TUBING (SET/KITS/TRAYS/PACK) ×3 IMPLANT
KIT SUPRAPUBIC CATH (MISCELLANEOUS) ×2 IMPLANT
KNIFE COLLINS 24FR (ELECTROSURGICAL) IMPLANT
LOOPS RESECTOSCOPE DISP (ELECTROSURGICAL) ×2 IMPLANT
MANIFOLD NEPTUNE II (INSTRUMENTS) ×3 IMPLANT
NEEDLE HYPO 22GX1.5 SAFETY (NEEDLE) IMPLANT
NS IRRIG 1000ML POUR BTL (IV SOLUTION) ×3 IMPLANT
PACK CYSTO (CUSTOM PROCEDURE TRAY) ×3 IMPLANT
PENCIL BUTTON HOLSTER BLD 10FT (ELECTRODE) IMPLANT
PLUG CATH AND CAP STER (CATHETERS) IMPLANT
SUT ETHILON 3 0 PS 1 (SUTURE) ×1 IMPLANT
SYR 30ML LL (SYRINGE) IMPLANT
SYRINGE IRR TOOMEY STRL 70CC (SYRINGE) ×3 IMPLANT
TOWEL OR 17X26 10 PK STRL BLUE (TOWEL DISPOSABLE) ×3 IMPLANT
TUBING CONNECTING 10 (TUBING) ×3 IMPLANT
WATER STERILE IRR 3000ML UROMA (IV SOLUTION) ×2 IMPLANT

## 2011-06-14 NOTE — Transfer of Care (Signed)
Immediate Anesthesia Transfer of Care Note  Patient: Jacob [name]  Procedure(s) Performed:  TRANSURETHRAL RESECTION OF THE PROSTATE (TURP); INSERTION OF SUPRAPUBIC CATHETER  Patient Location: PACU  Anesthesia Type: General  Level of Consciousness: sedated, patient cooperative and responds to stimulaton  Airway & Oxygen Therapy: Patient Spontanous Breathing and Patient connected to face mask oxgen  Post-op Assessment: Report given to PACU RN and Post -op Vital signs reviewed and stable  Post vital signs: Reviewed and stable  Complications: No apparent anesthesia complications

## 2011-06-14 NOTE — Progress Notes (Addendum)
ANTIBIOTIC CONSULT NOTE - INITIAL  Pharmacy Consult for: Gentamicin Indication: Pseudomonas UTI   Allergies  Allergen Reactions  . Penicillins Itching, Swelling and Rash    Patient Measurements: Height: 5\' 8"  (172.7 cm) Weight: 192 lb 0.3 oz (87.1 kg) IBW/kg (Calculated) : 68.4    Vital Signs: Temp: 97.5 F (36.4 C) (12/31 1145) Temp src: Oral (12/31 1145) BP: 107/64 mmHg (12/31 1145) Pulse Rate: 77  (12/31 1145) Intake/Output from previous day:   Intake/Output from this shift: Total I/O In: 800 [I.V.:800] Out: 1250 [Urine:1250]  Labs: No results found for this basename: WBC:3,HGB:3,PLT:3,LABCREA:3,CREATININE:3 in the last 72 hours Estimated Creatinine Clearance: 50.7 ml/min (by C-G formula based on Cr of 1.31). No results found for this basename: VANCOTROUGH:2,VANCOPEAK:2,VANCORANDOM:2,GENTTROUGH:2,GENTPEAK:2,GENTRANDOM:2,TOBRATROUGH:2,TOBRAPEAK:2,TOBRARND:2,AMIKACINPEAK:2,AMIKACINTROU:2,AMIKACIN:2, in the last 72 hours   Microbiology: Recent Results (from the past 720 hour(s))  URINE CULTURE     Status: Normal   Collection Time   05/19/11  1:15 PM      Component Value Range Status Comment   Culture STAPHYLOCOCCUS SPECIES (COAGULASE NEGATIVE)   Final    Colony Count >=100,000 COLONIES/ML   Final    Organism ID, Bacteria STAPHYLOCOCCUS SPECIES (COAGULASE NEGATIVE)   Final   SURGICAL PCR SCREEN     Status: Normal   Collection Time   06/09/11 11:42 AM      Component Value Range Status Comment   MRSA, PCR NEGATIVE  NEGATIVE  Final    Staphylococcus aureus NEGATIVE  NEGATIVE  Final     Medical History: Past Medical History  Diagnosis Date  . Systolic dysfunction 12/2006    AICD  . HTN (hypertension)   . History of diverticulitis of colon   . GERD (gastroesophageal reflux disease)   . History of nephrolithiasis   . CHF (congestive heart failure)     echo 2010 LVEF 25%; s/p ICD 12/2006  . Urinary retention     foley since july 2012, foley last changed 1 week ago    . Arthritis   . HLD (hyperlipidemia)   . Barrett's esophagus   . Coronary artery disease   . Benign prostatic hypertrophy   . Myocardial infarction     pt not sure when  . Dizziness   . Rash ON BACK  . BPH (benign prostatic hypertrophy) with urinary retention   . Foley catheter in place   . Cancer     HX OF SKIN CANCER    Medications:  Scheduled:    . carvedilol  6.25 mg Oral BID WC  . ceFAZolin (ANCEF) IV  1 g Intravenous Q8H  . ceFAZolin (ANCEF) IV  2 g Intravenous Once  . darifenacin  7.5 mg Oral Daily  . docusate sodium  100 mg Oral BID  . dutasteride  0.5 mg Oral Q0700  . gentamicin  120 mg Intravenous To OR  . isosorbide mononitrate  30 mg Oral Q0700  . pantoprazole  40 mg Oral Q1200  . simvastatin  40 mg Oral QPC supper  . Tamsulosin HCl  0.4 mg Oral Daily  . DISCONTD: cefTAZidime  1 g Intramuscular Once   Infusions:    . dextrose 5 % and 0.45 % NaCl with KCl 20 mEq/L 75 mL/hr (06/14/11 1130)  . DISCONTD: lactated ringers     PRN: HYDROcodone-acetaminophen, morphine, ondansetron, DISCONTD: fentaNYL, DISCONTD: lidocaine, DISCONTD: meperidine, DISCONTD: promethazine, DISCONTD: sodium chloride irrigation Assessment:    77 yo M s/p TURP.  Pt had indwelling catheter prior to procedure for urinary retention with an outpatient urine clx postive  for pseudomonas.  Pseudomonas reported pan sensitive by MD in H&P.  The plan was to not treat UTI until after procedure was performed.  Pt to now start gentamicin.     CrCl 50 ml/min  Afebrile at this time  Cefazolin appropriate for renal fnx   Goal of Therapy:  Gentamicin dose adjustment per [place] Nomogram  Plan:  1.) Gentamicin 530 mg IV x 1 dose at 1600 2.) Check 10 hour gentamicin level. PharmD to address level and order further doses.  3.) Will monitor renal fnx and antibiotic dosing.   4.) repeat urine clx?  Eathen Budreau, Loma Messing PharmD 12:47 PM 06/14/2011

## 2011-06-14 NOTE — Anesthesia Postprocedure Evaluation (Signed)
  Anesthesia Post-op Note  Patient: Jacob [name]  Procedure(s) Performed:  TRANSURETHRAL RESECTION OF THE PROSTATE (TURP); INSERTION OF SUPRAPUBIC CATHETER  Patient Location: PACU  Anesthesia Type: General  Level of Consciousness: awake and alert   Airway and Oxygen Therapy: Patient Spontanous Breathing  Post-op Pain: mild  Post-op Assessment: Post-op Vital signs reviewed, Patient's Cardiovascular Status Stable, Respiratory Function Stable, Patent Airway and No signs of Nausea or vomiting  Post-op Vital Signs: stable  Complications: No apparent anesthesia complications

## 2011-06-14 NOTE — Interval H&P Note (Signed)
History and Physical Interval Note:  06/14/2011 7:42 AM  Jacob [name]  has presented today for surgery, with the diagnosis of URINARY RETENTION  The various methods of treatment have been discussed with the patient and family. After consideration of risks, benefits and other options for treatment, the patient has consented to  Procedure(s): TRANSURETHRAL RESECTION OF THE PROSTATE (TURP) INSERTION OF SUPRAPUBIC CATHETER as a surgical intervention .  The patients' history has been reviewed, patient examined, no change in status, stable for surgery.  I have reviewed the patients' chart and labs.  Questions were answered to the patient's satisfaction.     Fredrika Canby S

## 2011-06-14 NOTE — Op Note (Signed)
Preoperative diagnosis: Urinary retention, BPH, neurogenic bladder Postoperative diagnosis: Same  Procedure: Gyrus TURP with suprapubic tube placement   Surgeon: Valetta Fuller M.D.  Anesthesia: Gen.  Indications: Patient has had long-standing urinary retention. He has failed multiple voiding trials despite maximum medical therapy. Cystoscopically he has had visual obstruction. Urodynamics also suggested a hypocontractile bladder. We discussed the multitude of options and elected to attempt a resection of the prostate to alleviate the obstructive component and place a suprapubic tube in case the patient continues to be unable to void successfully.    Technique and findings: The patient was brought to the operating room risks also induction of general anesthesia. He received perioperative gentamicin as well as Ancef. He was placed in lithotomy position and prepped and draped in the usual manner. Appropriate surgical timeout was performed. 28 French continuous flow resectoscope was utilized with the loop electrode. The gyrus this rotation was utilized with saline as and irrigated. Both ureteral orifices were identified. The middle lobe and bladder neck were then resected down to capsular fibers lateral lobes of the prostate were partially resected bilaterally. We did not resection to feel that the visual obstruction had been eliminated.apical tissue of the prostate was partially resected. Prostate chips were removed and sent for pathologic analysis. A Lowsley retractor was utilized to place the suprapubic tube in a standard manner. A 20 French Foley was utilized at the suprapubic tube catheter. A 22 French urethral catheter was then placed. Continuous bladder irrigation was then performed with normal saline and the irrigant was clear to very light pain.  Patient did not have any obvious complications or problems and was brought to recovery room in stable condition.

## 2011-06-14 NOTE — Anesthesia Preprocedure Evaluation (Addendum)
Anesthesia Evaluation  Patient identified by MRN, date of birth, ID band Patient awake    Reviewed: Allergy & Precautions, H&P , NPO status , Patient's Chart, lab work & pertinent test results  Airway Mallampati: II TM Distance: >3 FB Neck ROM: Full    Dental No notable dental hx.    Pulmonary neg pulmonary ROS,  clear to auscultation  Pulmonary exam normal       Cardiovascular hypertension, Pt. on medications + CAD, + Past MI, +CHF and neg cardio ROS + dysrhythmias Atrial Fibrillation + Cardiac Defibrillator Regular Normal    Neuro/Psych Negative Neurological ROS  Negative Psych ROS   GI/Hepatic negative GI ROS, Neg liver ROS, GERD-  ,  Endo/Other  Negative Endocrine ROS  Renal/GU negative Renal ROS  Genitourinary negative   Musculoskeletal negative musculoskeletal ROS (+)   Abdominal   Peds negative pediatric ROS (+)  Hematology negative hematology ROS (+)   Anesthesia Other Findings   Reproductive/Obstetrics negative OB ROS                          Anesthesia Physical Anesthesia Plan  ASA: III  Anesthesia Plan: General   Post-op Pain Management:    Induction: Intravenous  Airway Management Planned:   Additional Equipment:   Intra-op Plan:   Post-operative Plan: Extubation in OR  Informed Consent: I have reviewed the patients History and Physical, chart, labs and discussed the procedure including the risks, benefits and alternatives for the proposed anesthesia with the patient or authorized representative who has indicated his/her understanding and acceptance.   Dental advisory given  Plan Discussed with: CRNA  Anesthesia Plan Comments:         Anesthesia Quick Evaluation

## 2011-06-15 DIAGNOSIS — N401 Enlarged prostate with lower urinary tract symptoms: Secondary | ICD-10-CM | POA: Diagnosis not present

## 2011-06-15 DIAGNOSIS — D649 Anemia, unspecified: Secondary | ICD-10-CM | POA: Diagnosis not present

## 2011-06-15 DIAGNOSIS — N319 Neuromuscular dysfunction of bladder, unspecified: Secondary | ICD-10-CM | POA: Diagnosis not present

## 2011-06-15 DIAGNOSIS — Z79899 Other long term (current) drug therapy: Secondary | ICD-10-CM | POA: Diagnosis not present

## 2011-06-15 DIAGNOSIS — Z01812 Encounter for preprocedural laboratory examination: Secondary | ICD-10-CM | POA: Diagnosis not present

## 2011-06-15 DIAGNOSIS — Z7982 Long term (current) use of aspirin: Secondary | ICD-10-CM | POA: Diagnosis not present

## 2011-06-15 DIAGNOSIS — R339 Retention of urine, unspecified: Secondary | ICD-10-CM | POA: Diagnosis not present

## 2011-06-15 DIAGNOSIS — I252 Old myocardial infarction: Secondary | ICD-10-CM | POA: Diagnosis not present

## 2011-06-15 LAB — BASIC METABOLIC PANEL
BUN: 16 mg/dL (ref 6–23)
CO2: 28 mEq/L (ref 19–32)
GFR calc Af Amer: 50 mL/min — ABNORMAL LOW (ref >90)
Glucose, Bld: 115 mg/dL — ABNORMAL HIGH (ref 70–99)
Sodium: 138 mEq/L (ref 135–145)

## 2011-06-15 LAB — HEMOGLOBIN AND HEMATOCRIT, BLOOD
HCT: 33.9 % — ABNORMAL LOW (ref 39.0–52.0)
Hemoglobin: 11.3 g/dL — ABNORMAL LOW (ref 13.0–17.0)

## 2011-06-15 LAB — GENTAMICIN LEVEL, RANDOM: Gentamicin Rm: 10 ug/mL

## 2011-06-15 MED ORDER — BELLADONNA ALKALOIDS-OPIUM 16.2-60 MG RE SUPP
1.0000 | Freq: Four times a day (QID) | RECTAL | Status: DC | PRN
  Administered 2011-06-16: 1 via RECTAL
  Filled 2011-06-15: qty 1

## 2011-06-15 NOTE — Progress Notes (Signed)
ANTIBIOTIC CONSULT NOTE -Follow up  Pharmacy Consult for: Gentamicin Indication: Pseudomonas UTI,TURP  Allergies  Allergen Reactions  . Penicillins Itching, Swelling and Rash    Patient Measurements: Height: 5\' 8"  (172.7 cm) Weight: 192 lb 0.3 oz (87.1 kg) IBW/kg (Calculated) : 68.4  ABW 74 kg  Vital Signs: Temp: 98.3 F (36.8 C) (01/01 1322) Temp src: Oral (01/01 1322) BP: 142/85 mmHg (01/01 1322) Pulse Rate: 71  (01/01 1322) Intake/Output from previous day: 12/31 0701 - 01/01 0700 In: 3196.3 [P.O.:700; I.V.:2296.3; IV Piggyback:200] Out: 0  Intake/Output from this shift: Total I/O In: 100 [P.O.:100] Out: 625 [Urine:625]  Labs:  [facility] 06/15/11 0234  WBC --  HGB 11.3*  PLT --  LABCREA --  CREATININE 1.51*   Estimated Creatinine Clearance: 44 ml/min (by C-G formula based on Cr of 1.51).  Basename 06/15/11 0234  VANCOTROUGH --  Leodis Binet --  Drue Dun --  GENTTROUGH --  GENTPEAK --  GENTRANDOM 10.0  TOBRATROUGH --  TOBRAPEAK --  TOBRARND --  AMIKACINPEAK --  AMIKACINTROU --  AMIKACIN --     Microbiology: Recent Results (from the past 720 hour(s))  URINE CULTURE     Status: Normal   Collection Time   05/19/11  1:15 PM      Component Value Range Status Comment   Culture STAPHYLOCOCCUS SPECIES (COAGULASE NEGATIVE)   Final    Colony Count >=100,000 COLONIES/ML   Final    Organism ID, Bacteria STAPHYLOCOCCUS SPECIES (COAGULASE NEGATIVE)   Final   SURGICAL PCR SCREEN     Status: Normal   Collection Time   06/09/11 11:42 AM      Component Value Range Status Comment   MRSA, PCR NEGATIVE  NEGATIVE  Final    Staphylococcus aureus NEGATIVE  NEGATIVE  Final     Medical History: Past Medical History  Diagnosis Date  . Systolic dysfunction 12/2006    AICD  . HTN (hypertension)   . History of diverticulitis of colon   . GERD (gastroesophageal reflux disease)   . History of nephrolithiasis   . CHF (congestive heart failure)     echo 2010 LVEF 25%;  s/p ICD 12/2006  . Urinary retention     foley since july 2012, foley last changed 1 week ago  . Arthritis   . HLD (hyperlipidemia)   . Barrett's esophagus   . Coronary artery disease   . Benign prostatic hypertrophy   . Myocardial infarction     pt not sure when  . Dizziness   . Rash ON BACK  . BPH (benign prostatic hypertrophy) with urinary retention   . Foley catheter in place   . Cancer     HX OF SKIN CANCER    Medications:  Scheduled:     . carvedilol  6.25 mg Oral BID WC  . ceFAZolin (ANCEF) IV  1 g Intravenous Q8H  . darifenacin  7.5 mg Oral Daily  . docusate sodium  100 mg Oral BID  . dutasteride  0.5 mg Oral Q0700  . gentamicin  530 mg Intravenous Once  . isosorbide mononitrate  30 mg Oral Q0700  . pantoprazole  40 mg Oral Q1200  . simvastatin  40 mg Oral QPC supper  . Tamsulosin HCl  0.4 mg Oral Daily   Infusions:     . dextrose 5 % and 0.45 % NaCl with KCl 20 mEq/L 75 mL/hr at 06/15/11 0032   PRN: HYDROcodone-acetaminophen, morphine, ondansetron Assessment:    78 yo M s/p TURP.  Pt  had indwelling catheter prior to procedure for urinary retention with an outpatient urine clx postive for pseudomonas.  Pseudomonas reported pan sensitive by MD in H&P.  The plan was to not treat UTI until after procedure was performed.  Pt to now start gentamicin.     CrCl 44 ml/min  Afebrile at this time  Cefazolin appropriate for renal fnx  Goal of Therapy:  Gentamicin dose adjustment per [place] Nomogram  Plan:  1.) Gentamicin 530 mg IV x 1 dose at 1655 12/31 2.) Check 10 hour gentamicin level: 10 mcg/ml (above desired range per nomogram). No further Gentamicin needed.  3.) Will monitor renal fnx and antibiotic dosing.   4.) repeat urine clx? 5.) Plan d/c in am.  Otho Bellows PharmD 3:32 PM 06/15/2011

## 2011-06-15 NOTE — Progress Notes (Signed)
Patient ID: DECORIAN SCHUENEMANN, male   DOB: Apr 09, 1932, 78 y.o.   MRN: [id number] 1 Day Post-Op  Subjective: Mr. Borjon is doing well without complaints post TURP and SP tube insertion.   His urine is clear. ROS: General ROS: negative Respiratory ROS: no cough, shortness of breath, or wheezing Cardiovascular ROS: no chest pain or dyspnea on exertion Gastrointestinal ROS: negative Genito-Urinary ROS: negative  Objective: Vital signs in last 24 hours: Temp:  [96.7 F (35.9 C)-98.1 F (36.7 C)] 98.1 F (36.7 C) (01/01 0443) Pulse Rate:  [45-81] 80  (01/01 0443) Resp:  [13-17] 16  (01/01 0443) BP: (105-160)/(58-79) 126/67 mmHg (01/01 0443) SpO2:  [93 %-100 %] 93 % (01/01 0443) Weight:  [87.1 kg (192 lb 0.3 oz)] 192 lb 0.3 oz (87.1 kg) (12/31 1145)  Intake/Output from previous day: 12/31 0701 - 01/01 0700 In: 3196.3 [P.O.:700; I.V.:2296.3; IV Piggyback:200] Out: 0  Intake/Output this shift:    General appearance: alert and no distress GI: SP tube dressing dry. Male genitalia: normal, Foley draining clear urine.  Lab Results:   Basename 06/15/11 0234  WBC --  HGB 11.3*  HCT 33.9*  PLT --   BMET  Basename 06/15/11 0234  NA 138  K 4.2  CL 103  CO2 28  GLUCOSE 115*  BUN 16  CREATININE 1.51*  CALCIUM 7.9*   PT/INR No results found for this basename: LABPROT:2,INR:2 in the last 72 hours ABG No results found for this basename: PHART:2,PCO2:2,PO2:2,HCO3:2 in the last 72 hours  Studies/Results: No results found.  Anti-infectives: Anti-infectives     Start     Dose/Rate Route Frequency Ordered Stop   06/14/11 2100   gentamicin (GARAMYCIN) 430 mg in dextrose 5 % 50 mL IVPB  Status:  Discontinued        430 mg 121.5 mL/hr over 30 Minutes Intravenous  Once 06/14/11 1252 06/14/11 1307   06/14/11 1700   ceFAZolin (ANCEF) IVPB 1 g/50 mL premix        1 g 100 mL/hr over 30 Minutes Intravenous Every 8 hours 06/14/11 1156     06/14/11 1600   gentamicin (GARAMYCIN) 530 mg in  dextrose 5 % 100 mL IVPB        530 mg 113.3 mL/hr over 60 Minutes Intravenous  Once 06/14/11 1325 06/14/11 1754   06/14/11 0900   ceFAZolin (ANCEF) IVPB 2 g/50 mL premix        2 g 100 mL/hr over 30 Minutes Intravenous  Once 06/14/11 0803 06/14/11 0910   06/14/11 0900   gentamicin (GARAMYCIN) 120 mg in dextrose 5 % 50 mL IVPB        120 mg 106 mL/hr over 30 Minutes Intravenous To Surgery 06/14/11 0805 06/14/11 0840   06/14/11 0730   cefTAZidime (FORTAZ) injection 1 g  Status:  Discontinued        1 g Intramuscular  Once 06/14/11 0726 06/14/11 0814          Assessment: s/p Procedure(s): TRANSURETHRAL RESECTION OF THE PROSTATE (TURP) INSERTION OF SUPRAPUBIC CATHETER  Plan: D/C CBI and plug SP tube. Anticipate foley removal and D/C home in AM.   LOS: 1 day    Konnie Noffsinger J 06/15/2011

## 2011-06-16 MED ORDER — CIPROFLOXACIN HCL 500 MG PO TABS: 500.0000 mg | ORAL_TABLET | Freq: Two times a day (BID) | ORAL | Status: AC

## 2011-06-16 NOTE — Progress Notes (Signed)
Foley cathter removed per md order today 25cc h2o in balloon. Pt tolerated removal wnl. Will continue to monitor pt and output.

## 2011-06-16 NOTE — Discharge Summary (Signed)
Physician Discharge Summary  Patient ID: Jacob [name] MRN: [id number] DOB/AGE: [birth year] 76 y.o.  Admit date: 06/14/2011 Discharge date: 06/16/2011  Admission Diagnoses:Urinary retention/ BPH  Discharge Diagnoses: Same Active Problems:  * No active hospital problems. *    Discharged Condition: good  Hospital Course: Patient underwent a TURP and placement of suprapubic tube the day of admission. His surgery and postoperative course were unremarkable. His urine remained light pink in color and had no other clinical problems. His Foley catheter was removed on postoperative day 2. The patient was to have a voiding trial with use of the suprapubic tube to record postvoid residuals or to drain his bladder if he is unable to void.  Consults: none  Significant Diagnostic Studies:none   Treatments: surgery: TURP/ SP tube  Discharge Exam: Blood pressure 160/82, pulse 65, temperature 98.1 F (36.7 C), temperature source Oral, resp. rate 16, height 5\' 8"  (1.727 m), weight 87.1 kg (192 lb 0.3 oz), SpO2 96.00%. General appearance: alert and cooperative Resp: Nl effort Cards: rrr Abd: soft / NT Gu: wnl EXT: NT/ no edema  Disposition: Home or Self Care  Discharge Orders    Future Appointments: Provider: Department: Dept Phone: Center:   08/09/2011 11:00 AM Lewayne Bunting, MD [facility] [phone] [facility]   08/17/2011 2:00 PM Wendall Stade, MD [facility] [phone] [facility]   08/25/2011 1:45 PM [name], MD [facility] [phone] [facility]     Medication List  As of 06/16/2011  8:47 AM   START taking these medications         ciprofloxacin 500 MG tablet   Commonly known as: CIPRO   Take 1 tablet (500 mg total) by mouth 2 (two) times daily.         CONTINUE taking these medications         aspirin 81 MG tablet      AVODART 0.5 MG capsule   Generic drug: dutasteride      carvedilol 6.25 MG tablet   Commonly known as: COREG   Take 1 tablet (6.25 mg  total) by mouth 2 (two) times daily with a meal.      cyanocobalamin 100 MCG tablet      fluticasone 50 MCG/ACT nasal spray   Commonly known as: FLONASE   Place 2 sprays into the nose daily as needed for allergies.      HYDROcodone-acetaminophen 5-325 MG per tablet   Commonly known as: NORCO      isosorbide mononitrate 30 MG 24 hr tablet   Commonly known as: IMDUR      omeprazole 20 MG capsule   Commonly known as: PRILOSEC   Take 1 capsule (20 mg total) by mouth daily.      simvastatin 40 MG tablet   Commonly known as: ZOCOR      Tamsulosin HCl 0.4 MG Caps   Commonly known as: FLOMAX   TAKE ONE CAPSULE BY MOUTH EVERY DAY         STOP taking these medications         dabigatran 150 MG Caps      nitrofurantoin 100 MG capsule      solifenacin 5 MG tablet          Where to get your medications    These are the prescriptions that you need to pick up. We sent them to a specific pharmacy, so you will need to go there to get them.   [facility] [address] [address], [place] -  [address] AT [facility] [address]    [address] [place] [address]    Phone: [phone]        ciprofloxacin 500 MG tablet             Signed: Ewald Beg S 06/16/2011, 8:47 AM

## 2011-06-16 NOTE — Progress Notes (Signed)
Pt discharged today, Instructions given to pt/spouse r/t f/u appts and home medications. Pt foley dc'd this am. Pt demonstrated understanding of suprapubic care. Pt voided small amount 50 cc urine and suprapubic amount unable to measure, pt emptied as I was entering the room.Pt /spouse verbalized understanding of home instructions r/t medications and f/u appointments. Pt stable at this time iv dc'd at this time. Rheagan Nayak [employer]

## 2011-07-01 DIAGNOSIS — B029 Zoster without complications: Secondary | ICD-10-CM | POA: Diagnosis not present

## 2011-07-06 ENCOUNTER — Encounter (HOSPITAL_COMMUNITY): Payer: Self-pay | Admitting: Urology

## 2011-07-08 DIAGNOSIS — R310 Gross hematuria: Secondary | ICD-10-CM | POA: Diagnosis not present

## 2011-07-08 DIAGNOSIS — N3020 Other chronic cystitis without hematuria: Secondary | ICD-10-CM | POA: Diagnosis not present

## 2011-08-09 ENCOUNTER — Encounter: Payer: Self-pay | Admitting: Internal Medicine

## 2011-08-09 ENCOUNTER — Ambulatory Visit (INDEPENDENT_AMBULATORY_CARE_PROVIDER_SITE_OTHER): Payer: Medicare Other | Admitting: Internal Medicine

## 2011-08-09 DIAGNOSIS — I509 Heart failure, unspecified: Secondary | ICD-10-CM | POA: Diagnosis not present

## 2011-08-09 DIAGNOSIS — I10 Essential (primary) hypertension: Secondary | ICD-10-CM

## 2011-08-09 DIAGNOSIS — I2510 Atherosclerotic heart disease of native coronary artery without angina pectoris: Secondary | ICD-10-CM | POA: Diagnosis not present

## 2011-08-09 DIAGNOSIS — I2589 Other forms of chronic ischemic heart disease: Secondary | ICD-10-CM

## 2011-08-09 DIAGNOSIS — Z95810 Presence of automatic (implantable) cardiac defibrillator: Secondary | ICD-10-CM | POA: Diagnosis not present

## 2011-08-09 LAB — ICD DEVICE OBSERVATION
AL AMPLITUDE: 2 mv
ATRIAL PACING ICD: 0 pct
BATTERY VOLTAGE: 2.94 V
DEVICE MODEL ICD: 504434
HV IMPEDENCE: 38 Ohm
TZAT-0013FASTVT: 2
TZAT-0018FASTVT: NEGATIVE
TZST-0001FASTVT: 3
TZST-0001FASTVT: 7
TZST-0003FASTVT: 41 J
TZST-0003FASTVT: 41 J
VENTRICULAR PACING ICD: 12 pct

## 2011-08-09 NOTE — Progress Notes (Signed)
HPI [name] today for followup. He is a very pleasant 72 syndrome and with an ischemic cardiomyopathy, chronic class II systolic heart failure, status post ICD insertion. He has ventricular tachycardia. He denies any recent ICD shocks. No chest pain. He has some arthritis and is limited in his activity by this. Allergies  Allergen Reactions  . Penicillins Itching, Swelling and Rash     Current Outpatient Prescriptions  Medication Sig Dispense Refill  . aspirin 81 MG tablet Take 81 mg by mouth every morning.       . carvedilol (COREG) 6.25 MG tablet Take 1 tablet (6.25 mg total) by mouth 2 (two) times daily with a meal.  60 tablet  5  . cyanocobalamin 100 MCG tablet Take 100 mcg by mouth daily.       Marland Kitchen dutasteride (AVODART) 0.5 MG capsule Take 0.5 mg by mouth every morning.       . fluticasone (FLONASE) 50 MCG/ACT nasal spray Place 2 sprays into the nose daily as needed for allergies.  16 g  6  . HYDROcodone-acetaminophen (NORCO) 5-325 MG per tablet Take 1 tablet by mouth every 6 (six) hours as needed. Pain       . isosorbide mononitrate (IMDUR) 30 MG 24 hr tablet Take 30 mg by mouth every morning.       Marland Kitchen omeprazole (PRILOSEC) 20 MG capsule Take 1 capsule (20 mg total) by mouth daily.  30 capsule  5  . simvastatin (ZOCOR) 40 MG tablet 40 mg every morning.       . solifenacin (VESICARE) 5 MG tablet Take 10 mg by mouth daily.      Marland Kitchen sulfamethoxazole-trimethoprim (BACTRIM DS) 800-160 MG per tablet Take 1 tablet by mouth Twice daily.       . Tamsulosin HCl (FLOMAX) 0.4 MG CAPS TAKE ONE CAPSULE BY MOUTH EVERY DAY  30 capsule  6     Past Medical History  Diagnosis Date  . Systolic dysfunction 12/2006    AICD  . HTN (hypertension)   . History of diverticulitis of colon   . GERD (gastroesophageal reflux disease)   . History of nephrolithiasis   . CHF (congestive heart failure)     echo 2010 LVEF 25%; s/p ICD 12/2006  . Urinary retention     foley since july 2012, foley last changed 1  week ago  . Arthritis   . HLD (hyperlipidemia)   . Barrett's esophagus   . Coronary artery disease   . Benign prostatic hypertrophy   . Myocardial infarction     pt not sure when  . Dizziness   . Rash ON BACK  . BPH (benign prostatic hypertrophy) with urinary retention   . Foley catheter in place   . Cancer     HX OF SKIN CANCER    ROS:   All systems reviewed and negative except as noted in the HPI.   Past Surgical History  Procedure Date  . Cardiac defibrillator placement 12/2006    AICD  . Total knee arthroplasty 01/04/11    L TKA  . Cardiac catheterization 11-05-10  . Total knee arthroplasty     left  January 04, 2011  . Arthroscopic knee surgery 5-6 yrs ago    both knees  . Joint replacement   . Cataracts removed   . Transurethral resection of prostate 06/14/2011    Procedure: TRANSURETHRAL RESECTION OF THE PROSTATE (TURP);  Surgeon: Valetta Fuller, MD;  Location: [facility];  Service: Urology;  Laterality:  N/A;     Family History  Problem Relation Age of Onset  . Arthritis    . Hypertension    . Heart disease       History   Social History  . Marital Status: Married    Spouse Name: N/A    Number of Children: N/A  . Years of Education: N/A   Occupational History  . retired    Social History Main Topics  . Smoking status: Former Smoker -- 2.0 packs/day for 30 years    Quit date: 06/14/1968  . Smokeless tobacco: Never Used  . Alcohol Use: No  . Drug Use: No  . Sexually Active: Not on file   Other Topics Concern  . Not on file   Social History Narrative  . No narrative on file     BP 118/60  Pulse 77  Wt 87 kg (191 lb 12.8 oz)  Physical Exam:  Well appearing elderly man, NAD HEENT: Unremarkable Neck:  No JVD, no thyromegally Lungs:  Clear with no wheezes, rales, or rhonchi. Well-healed ICD incision. HEART:  Regular rate rhythm, no murmurs, no rubs, no clicks Abd:  soft, positive bowel sounds, no organomegally, no rebound, no guarding Ext:   2 plus pulses, no edema, no cyanosis, no clubbing Skin:  No rashes no nodules Neuro:  CN II through XII intact, motor grossly intact  DEVICE  Normal device function.  See PaceArt for details.   Assess/Plan:

## 2011-08-09 NOTE — Assessment & Plan Note (Signed)
His device is working normally. We'll plan to recheck in several months. 

## 2011-08-09 NOTE — Assessment & Plan Note (Signed)
He denies anginal symptoms. He will continue his current medical therapy. 

## 2011-08-09 NOTE — Patient Instructions (Signed)
Your physician recommends that you schedule a follow-up appointment in: 3 months with device clinic   Your physician wants you to follow-up in: 12 months with Dr Taylor You will receive a reminder letter in the mail two months in advance. If you don't receive a letter, please call our office to schedule the follow-up appointment.   

## 2011-08-09 NOTE — Assessment & Plan Note (Signed)
His blood pressure today is well controlled. He will continue his current medical therapy and maintain a low-sodium diet. 

## 2011-08-17 ENCOUNTER — Encounter: Payer: Self-pay | Admitting: Cardiovascular Disease

## 2011-08-17 ENCOUNTER — Ambulatory Visit (INDEPENDENT_AMBULATORY_CARE_PROVIDER_SITE_OTHER): Payer: Medicare Other | Admitting: Cardiovascular Disease

## 2011-08-17 DIAGNOSIS — I2510 Atherosclerotic heart disease of native coronary artery without angina pectoris: Secondary | ICD-10-CM

## 2011-08-17 DIAGNOSIS — E7800 Pure hypercholesterolemia, unspecified: Secondary | ICD-10-CM | POA: Diagnosis not present

## 2011-08-17 DIAGNOSIS — I509 Heart failure, unspecified: Secondary | ICD-10-CM | POA: Diagnosis not present

## 2011-08-17 DIAGNOSIS — N401 Enlarged prostate with lower urinary tract symptoms: Secondary | ICD-10-CM

## 2011-08-17 DIAGNOSIS — I10 Essential (primary) hypertension: Secondary | ICD-10-CM

## 2011-08-17 NOTE — Assessment & Plan Note (Signed)
S/P TURP with resolution of hematuria and good flow

## 2011-08-17 NOTE — Assessment & Plan Note (Signed)
Stable with no angina and good activity level.  Continue medical Rx  

## 2011-08-17 NOTE — Progress Notes (Signed)
F/U CAD. Cath 5/12 to clear him for knee surgery. Chronic total RCA with collaterals and no significant left sided disease. Right sided pressures normal. EF 25-30% and Cr around 1.7. AICD placed by [facility]. No D/C;s. Left TKR by Dr Charlann Boxer in July 2012  With subsequent hematuria and renal failure with Cr 3.2. Was on Pradaxa post op. Most recent Cr a month ago was 1.4 Seen by TW in ER 03/23/11 thought to be gi in nature and D/C home with PPI.   Subsequently had uneventful TURP.  Just saw GT and AICD functioning normally with no shocks  ROS: Denies fever, malais, weight loss, blurry vision, decreased visual acuity, cough, sputum, SOB, hemoptysis, pleuritic pain, palpitaitons, heartburn, abdominal pain, melena, lower extremity edema, claudication, or rash.  All other systems reviewed and negative  General: Affect appropriate Healthy:  appears stated age HEENT: normal Neck supple with no adenopathy JVP normal no bruits no thyromegaly Lungs clear with no wheezing and good diaphragmatic motion Heart:  S1/S2 no murmur, no rub, gallop or click PMI normal Abdomen: benighn, BS positve, no tenderness, no AAA no bruit.  No HSM or HJR Distal pulses intact with no bruits No edema Neuro non-focal Skin warm and dry No muscular weakness   Current Outpatient Prescriptions  Medication Sig Dispense Refill  . aspirin 81 MG tablet Take 81 mg by mouth every morning.       . carvedilol (COREG) 6.25 MG tablet Take 1 tablet (6.25 mg total) by mouth 2 (two) times daily with a meal.  60 tablet  5  . cyanocobalamin 100 MCG tablet Take 100 mcg by mouth daily.       Marland Kitchen dutasteride (AVODART) 0.5 MG capsule Take 0.5 mg by mouth every morning.       . fluticasone (FLONASE) 50 MCG/ACT nasal spray Place 2 sprays into the nose daily as needed for allergies.  16 g  6  . HYDROcodone-acetaminophen (NORCO) 5-325 MG per tablet Take 1 tablet by mouth every 6 (six) hours as needed. Pain       . isosorbide mononitrate (IMDUR) 30 MG  24 hr tablet Take 30 mg by mouth every morning.       Marland Kitchen NITROSTAT 0.4 MG SL tablet       . omeprazole (PRILOSEC) 20 MG capsule Take 1 capsule (20 mg total) by mouth daily.  30 capsule  5  . simvastatin (ZOCOR) 40 MG tablet 40 mg every morning.       . solifenacin (VESICARE) 5 MG tablet Take 10 mg by mouth daily.      Marland Kitchen sulfamethoxazole-trimethoprim (BACTRIM DS) 800-160 MG per tablet Take 1 tablet by mouth Twice daily.       . Tamsulosin HCl (FLOMAX) 0.4 MG CAPS TAKE ONE CAPSULE BY MOUTH EVERY DAY  30 capsule  6    Allergies  Penicillins  Electrocardiogram:  Assessment and Plan

## 2011-08-17 NOTE — Assessment & Plan Note (Signed)
Well controlled.  Continue current medications and low sodium Dash type diet.    

## 2011-08-17 NOTE — Assessment & Plan Note (Signed)
Cholesterol is at goal.  Continue current dose of statin and diet Rx.  No myalgias or side effects.  F/U  LFT's in 6 months. Lab Results  Component Value Date   LDLCALC 53 12/26/2006             

## 2011-08-17 NOTE — Patient Instructions (Signed)
Your physician wants you to follow-up in:  6 MONTHS WITH DR NISHAN  You will receive a reminder letter in the mail two months in advance. If you don't receive a letter, please call our office to schedule the follow-up appointment. Your physician recommends that you continue on your current medications as directed. Please refer to the Current Medication list given to you today. 

## 2011-08-17 NOTE — Assessment & Plan Note (Signed)
Euvolemic.  Continue current meds  Functional class 2

## 2011-08-25 ENCOUNTER — Ambulatory Visit: Payer: Medicare Other | Admitting: Internal Medicine

## 2011-08-25 ENCOUNTER — Other Ambulatory Visit (INDEPENDENT_AMBULATORY_CARE_PROVIDER_SITE_OTHER): Payer: Medicare Other

## 2011-08-25 ENCOUNTER — Ambulatory Visit (INDEPENDENT_AMBULATORY_CARE_PROVIDER_SITE_OTHER): Payer: Medicare Other | Admitting: Internal Medicine

## 2011-08-25 ENCOUNTER — Encounter: Payer: Self-pay | Admitting: Internal Medicine

## 2011-08-25 VITALS — BP 100/52 | HR 68 | Temp 97.3°F | Ht 67.0 in | Wt 190.0 lb

## 2011-08-25 DIAGNOSIS — E7800 Pure hypercholesterolemia, unspecified: Secondary | ICD-10-CM

## 2011-08-25 DIAGNOSIS — N401 Enlarged prostate with lower urinary tract symptoms: Secondary | ICD-10-CM

## 2011-08-25 DIAGNOSIS — J42 Unspecified chronic bronchitis: Secondary | ICD-10-CM | POA: Diagnosis not present

## 2011-08-25 DIAGNOSIS — L509 Urticaria, unspecified: Secondary | ICD-10-CM

## 2011-08-25 LAB — LIPID PANEL
Cholesterol: 99 mg/dL (ref 0–200)
LDL Cholesterol: 46 mg/dL (ref 0–99)
Total CHOL/HDL Ratio: 3

## 2011-08-25 MED ORDER — LORATADINE 10 MG PO TABS: 10.0000 mg | ORAL_TABLET | Freq: Every day | ORAL | Status: DC | PRN

## 2011-08-25 MED ORDER — GUAIFENESIN ER 600 MG PO TB12: 1200.0000 mg | ORAL_TABLET | Freq: Two times a day (BID) | ORAL | Status: DC

## 2011-08-25 NOTE — Assessment & Plan Note (Signed)
Indwelling foley due to BOO since 01/2011 - 03/2011- The current medical regimen is effective;  continue present plan and medications.

## 2011-08-25 NOTE — Assessment & Plan Note (Signed)
On simva, tolerating well The current medical regimen is effective;  continue present plan and medications.  check annually - adjust as needed

## 2011-08-25 NOTE — Progress Notes (Signed)
Subjective:    Patient ID: Jacob [name], male    DOB: 02/21/1932, 78 y.o.   MRN: [id number]  HPI  Here for follow up -  complains of mucus at night when supine - +PND but no chest congestion  Also itch and rash, mild x 6 mo - unchanged Denies detergent change or med changes No history of same -   Also reviewed chronic med issues:  Diffuse OA - both shoulders, right>left, knees and hips  S/p 12/2010 L TKR with ortho after cleared by cards with 11/05/10 cath -   CHF/CAD - follows with [facility] cards for same - nishan/[facility]  reports compliance with ongoing medical treatment and no changes in medication dose or frequency.  denies adverse side effects related to current therapy. no chest pain, shortness of breath or edema -   HTN - reports compliance with ongoing medical treatment and no changes in medication dose or frequency. denies adverse side effects related to current therapy. no headache or vision changes   dyslipidemia - reports compliance with ongoing medical treatment and no changes in medication dose or frequency. denies adverse side effects related to current therapy.   BPH - hx urinary retention from BOO summer 2012 - follows with dr. Isabel Caprice -uro  -postop 01/2011 requiring indwelling cath, removed 03/2011 - intermittent abx for UTI   Past Medical History  Diagnosis Date  . Systolic dysfunction 12/2006    AICD  . HTN (hypertension)   . History of diverticulitis of colon   . GERD (gastroesophageal reflux disease)   . History of nephrolithiasis   . CHF (congestive heart failure)     echo 2010 LVEF 25%; s/p ICD 12/2006  . Urinary retention     foley since july 2012, foley last changed 1 week ago  . Arthritis   . HLD (hyperlipidemia)   . Barrett's esophagus   . Coronary artery disease   . Benign prostatic hypertrophy   . Skin cancer     Review of Systems  Cardiovascular: Negative for chest pain and leg swelling.  Musculoskeletal: Positive for gait problem.    Neurological: Negative for weakness and headaches.       Objective:   Physical Exam  BP 100/52  Pulse 68  Temp(Src) 97.3 F (36.3 C) (Oral)  Ht 5\' 7"  (1.702 m)  Wt 190 lb (86.183 kg)  BMI 29.76 kg/m2  SpO2 98% Wt Readings from Last 3 Encounters:  08/25/11 190 lb (86.183 kg)  08/17/11 192 lb 12.8 oz (87.454 kg)  08/09/11 191 lb 12.8 oz (87 kg)   Constitutional:  overweight. appears well-developed and well-nourished. No distress. Wife at side Neck: Normal range of motion. Neck supple. No JVD present. No thyromegaly present.  Cardiovascular: Normal rate, regular rhythm and normal heart sounds.  No murmur heard. no BLE edema Pulmonary/Chest: Effort normal and breath sounds normal. No respiratory distress. no wheezes.  Abdominal: Soft. Bowel sounds are normal. Patient exhibits no distension. There is no tenderness.  Skin: mild hives on BUE/forearms and chest/back Psychiatric: he has a normal mood and affect. behavior is normal. Judgment and thought content normal.       Lab Results  Component Value Date   WBC 7.2 06/09/2011   HGB 11.3* 06/15/2011   HCT 33.9* 06/15/2011   PLT 180 06/09/2011   CHOL 131 08/26/2010   TRIG 219.0* 08/26/2010   HDL 29.30* 08/26/2010   LDLDIRECT 54.3 08/26/2010   ALT 9 03/02/2011   AST 12 03/02/2011   NA  138 06/15/2011   K 4.2 06/15/2011   CL 103 06/15/2011   CREATININE 1.51* 06/15/2011   BUN 16 06/15/2011   CO2 28 06/15/2011   TSH 0.99 02/10/2011   INR 1.19 12/30/2010     Assessment & Plan:  See problem list. Medications and labs reviewed today.  Hives - unknown trigger - chronic > 6 months, unchanged - suspect med related, but unable to remove any meds on list due to other comorbid dz states - add Claritin daily - offered derm eval, pt/wife decline same at this time  Chronic bronchitis - nocturnal mucus when supine - add mucinex and claritin, no fever, discoloration or "cough" - will follow symptoms   Time spent with pt/family today 25 minutes, greater  than 50% time spent counseling patient on BOO and medication review. Also review of prior labs

## 2011-08-25 NOTE — Patient Instructions (Signed)
It was good to see you today. Start mucinex and claritin for mucus symptoms and itch as discussed - other Medications reviewed, no changes at this time. Also use skin mosturizer after any shower/bath and Mositurizing soap Test(s) ordered today. Your results will be called to you after review (48-72hours after test completion). If any changes need to be made, you will be notified at that time. Please schedule followup in 3-4 months, call sooner if problems.

## 2011-09-06 ENCOUNTER — Encounter: Payer: Self-pay | Admitting: Internal Medicine

## 2011-09-07 DIAGNOSIS — M1710 Unilateral primary osteoarthritis, unspecified knee: Secondary | ICD-10-CM | POA: Diagnosis not present

## 2011-09-29 DIAGNOSIS — M19019 Primary osteoarthritis, unspecified shoulder: Secondary | ICD-10-CM | POA: Diagnosis not present

## 2011-10-13 DIAGNOSIS — G459 Transient cerebral ischemic attack, unspecified: Secondary | ICD-10-CM

## 2011-10-13 HISTORY — DX: Transient cerebral ischemic attack, unspecified: G45.9

## 2011-10-14 ENCOUNTER — Other Ambulatory Visit: Payer: Self-pay | Admitting: Internal Medicine

## 2011-10-18 ENCOUNTER — Other Ambulatory Visit: Payer: Self-pay | Admitting: Internal Medicine

## 2011-10-26 DIAGNOSIS — N401 Enlarged prostate with lower urinary tract symptoms: Secondary | ICD-10-CM | POA: Diagnosis not present

## 2011-10-26 DIAGNOSIS — R339 Retention of urine, unspecified: Secondary | ICD-10-CM | POA: Diagnosis not present

## 2011-10-26 DIAGNOSIS — N3020 Other chronic cystitis without hematuria: Secondary | ICD-10-CM | POA: Diagnosis not present

## 2011-11-07 ENCOUNTER — Encounter (HOSPITAL_COMMUNITY): Payer: Self-pay | Admitting: *Deleted

## 2011-11-07 ENCOUNTER — Observation Stay (HOSPITAL_COMMUNITY)
Admission: EM | Admit: 2011-11-07 | Discharge: 2011-11-08 | Disposition: A | Discharge status: Home / Self Care | Payer: Medicare Other | Attending: Internal Medicine | Admitting: Internal Medicine

## 2011-11-07 ENCOUNTER — Emergency Department (HOSPITAL_COMMUNITY): Payer: Medicare Other

## 2011-11-07 DIAGNOSIS — I4891 Unspecified atrial fibrillation: Secondary | ICD-10-CM

## 2011-11-07 DIAGNOSIS — K219 Gastro-esophageal reflux disease without esophagitis: Secondary | ICD-10-CM | POA: Diagnosis not present

## 2011-11-07 DIAGNOSIS — E785 Hyperlipidemia, unspecified: Secondary | ICD-10-CM | POA: Insufficient documentation

## 2011-11-07 DIAGNOSIS — I10 Essential (primary) hypertension: Secondary | ICD-10-CM | POA: Diagnosis present

## 2011-11-07 DIAGNOSIS — I5041 Acute combined systolic (congestive) and diastolic (congestive) heart failure: Secondary | ICD-10-CM | POA: Diagnosis present

## 2011-11-07 DIAGNOSIS — I079 Rheumatic tricuspid valve disease, unspecified: Secondary | ICD-10-CM | POA: Insufficient documentation

## 2011-11-07 DIAGNOSIS — G459 Transient cerebral ischemic attack, unspecified: Secondary | ICD-10-CM

## 2011-11-07 DIAGNOSIS — R29818 Other symptoms and signs involving the nervous system: Secondary | ICD-10-CM | POA: Diagnosis not present

## 2011-11-07 DIAGNOSIS — I2510 Atherosclerotic heart disease of native coronary artery without angina pectoris: Secondary | ICD-10-CM | POA: Diagnosis present

## 2011-11-07 DIAGNOSIS — R4789 Other speech disturbances: Secondary | ICD-10-CM

## 2011-11-07 DIAGNOSIS — Z7982 Long term (current) use of aspirin: Secondary | ICD-10-CM | POA: Diagnosis not present

## 2011-11-07 DIAGNOSIS — I6789 Other cerebrovascular disease: Secondary | ICD-10-CM | POA: Diagnosis not present

## 2011-11-07 DIAGNOSIS — Z95810 Presence of automatic (implantable) cardiac defibrillator: Secondary | ICD-10-CM | POA: Diagnosis present

## 2011-11-07 DIAGNOSIS — G8190 Hemiplegia, unspecified affecting unspecified side: Secondary | ICD-10-CM

## 2011-11-07 DIAGNOSIS — I25810 Atherosclerosis of coronary artery bypass graft(s) without angina pectoris: Secondary | ICD-10-CM

## 2011-11-07 DIAGNOSIS — I509 Heart failure, unspecified: Secondary | ICD-10-CM | POA: Insufficient documentation

## 2011-11-07 DIAGNOSIS — I6340 Cerebral infarction due to embolism of unspecified cerebral artery: Secondary | ICD-10-CM

## 2011-11-07 DIAGNOSIS — E7800 Pure hypercholesterolemia, unspecified: Secondary | ICD-10-CM | POA: Diagnosis present

## 2011-11-07 LAB — URINE MICROSCOPIC-ADD ON

## 2011-11-07 LAB — CBC
Hemoglobin: 10.5 g/dL — ABNORMAL LOW (ref 13.0–17.0)
MCH: 24.1 pg — ABNORMAL LOW (ref 26.0–34.0)
Platelets: 159 10*3/uL (ref 150–400)
RBC: 4.36 MIL/uL (ref 4.22–5.81)
RDW: 16.5 % — ABNORMAL HIGH (ref 11.5–15.5)
WBC: 7.4 10*3/uL (ref 4.0–10.5)

## 2011-11-07 LAB — COMPREHENSIVE METABOLIC PANEL
ALT: 7 U/L (ref 0–53)
Alkaline Phosphatase: 71 U/L (ref 39–117)
CO2: 27 mEq/L (ref 19–32)
Chloride: 108 mEq/L (ref 96–112)
GFR calc Af Amer: 62 mL/min — ABNORMAL LOW (ref >90)
GFR calc non Af Amer: 54 mL/min — ABNORMAL LOW (ref >90)
Glucose, Bld: 74 mg/dL (ref 70–99)
Potassium: 4.1 mEq/L (ref 3.5–5.1)
Sodium: 141 mEq/L (ref 135–145)
Total Bilirubin: 0.3 mg/dL (ref 0.3–1.2)

## 2011-11-07 LAB — URINALYSIS, ROUTINE W REFLEX MICROSCOPIC
Nitrite: NEGATIVE
Specific Gravity, Urine: 1.025 (ref 1.005–1.030)
Urobilinogen, UA: 0.2 mg/dL (ref 0.0–1.0)

## 2011-11-07 LAB — CREATININE, SERUM
Creatinine, Ser: 1.21 mg/dL (ref 0.50–1.35)
GFR calc Af Amer: 64 mL/min — ABNORMAL LOW (ref >90)
GFR calc non Af Amer: 56 mL/min — ABNORMAL LOW (ref >90)

## 2011-11-07 MED ORDER — ISOSORBIDE MONONITRATE ER 30 MG PO TB24
30.0000 mg | ORAL_TABLET | Freq: Every day | ORAL | Status: DC
  Administered 2011-11-08: 30 mg via ORAL
  Filled 2011-11-07: qty 1

## 2011-11-07 MED ORDER — SODIUM CHLORIDE 0.9 % IV SOLN
INTRAVENOUS | Status: DC
  Administered 2011-11-07: 10 mL/h via INTRAVENOUS

## 2011-11-07 MED ORDER — LORATADINE 10 MG PO TABS
10.0000 mg | ORAL_TABLET | Freq: Every day | ORAL | Status: DC | PRN
Start: 2011-11-07 — End: 2011-11-08
  Filled 2011-11-07: qty 1

## 2011-11-07 MED ORDER — VITAMIN B-12 100 MCG PO TABS
100.0000 ug | ORAL_TABLET | Freq: Every day | ORAL | Status: DC
  Administered 2011-11-08: 100 ug via ORAL
  Filled 2011-11-07: qty 1

## 2011-11-07 MED ORDER — FLUTICASONE PROPIONATE 50 MCG/ACT NA SUSP: 2.0000 | Freq: Every day | NASAL | Status: DC | PRN

## 2011-11-07 MED ORDER — DARIFENACIN HYDROBROMIDE ER 7.5 MG PO TB24
7.5000 mg | ORAL_TABLET | Freq: Every day | ORAL | Status: DC
  Administered 2011-11-08: 7.5 mg via ORAL
  Filled 2011-11-07: qty 1

## 2011-11-07 MED ORDER — HYDROCODONE-ACETAMINOPHEN 5-325 MG PO TABS: 1.0000 | ORAL_TABLET | Freq: Four times a day (QID) | ORAL | Status: DC | PRN

## 2011-11-07 MED ORDER — ENOXAPARIN SODIUM 40 MG/0.4ML ~~LOC~~ SOLN
40.0000 mg | SUBCUTANEOUS | Status: DC
  Administered 2011-11-07: 40 mg via SUBCUTANEOUS
  Filled 2011-11-07 (×2): qty 0.4

## 2011-11-07 MED ORDER — TAMSULOSIN HCL 0.4 MG PO CAPS
0.4000 mg | ORAL_CAPSULE | Freq: Every day | ORAL | Status: DC
  Administered 2011-11-08: 0.4 mg via ORAL
  Filled 2011-11-07: qty 1

## 2011-11-07 MED ORDER — VITAMIN B-12 100 MCG PO TABS: 100.0000 ug | ORAL_TABLET | Freq: Every day | ORAL | Status: DC

## 2011-11-07 MED ORDER — ACETAMINOPHEN 325 MG PO TABS: 650.0000 mg | ORAL_TABLET | ORAL | Status: DC | PRN

## 2011-11-07 MED ORDER — ASPIRIN 325 MG PO TABS
325.0000 mg | ORAL_TABLET | Freq: Every day | ORAL | Status: DC
  Administered 2011-11-08: 325 mg via ORAL
  Filled 2011-11-07: qty 1

## 2011-11-07 MED ORDER — CARVEDILOL 6.25 MG PO TABS
6.2500 mg | ORAL_TABLET | Freq: Two times a day (BID) | ORAL | Status: DC
  Administered 2011-11-07 – 2011-11-08 (×2): 6.25 mg via ORAL
  Filled 2011-11-07 (×4): qty 1

## 2011-11-07 MED ORDER — DUTASTERIDE 0.5 MG PO CAPS
0.5000 mg | ORAL_CAPSULE | Freq: Every day | ORAL | Status: DC
  Administered 2011-11-08: 0.5 mg via ORAL
  Filled 2011-11-07: qty 1

## 2011-11-07 MED ORDER — PANTOPRAZOLE SODIUM 40 MG PO TBEC
40.0000 mg | DELAYED_RELEASE_TABLET | Freq: Every day | ORAL | Status: DC
  Administered 2011-11-08: 40 mg via ORAL
  Filled 2011-11-07: qty 1

## 2011-11-07 MED ORDER — ASPIRIN 81 MG PO CHEW
324.0000 mg | CHEWABLE_TABLET | Freq: Once | ORAL | Status: AC
  Administered 2011-11-07: 324 mg via ORAL
  Filled 2011-11-07: qty 4

## 2011-11-07 MED ORDER — NITROGLYCERIN 0.4 MG SL SUBL: 0.4000 mg | SUBLINGUAL_TABLET | SUBLINGUAL | Status: DC | PRN

## 2011-11-07 MED ORDER — SIMVASTATIN 40 MG PO TABS
40.0000 mg | ORAL_TABLET | Freq: Every day | ORAL | Status: DC
  Filled 2011-11-07: qty 1

## 2011-11-07 NOTE — Consult Note (Signed)
Referring Physician: Rai    Chief Complaint: Right sided weakness  HPI: Jacob [name] is an 78 y.o. male who was taking the trash outside today at noon. When he came inside, around 12:30 PM, she noticed that he had right-sided facial drooping and dropped something he was holding in his right hand. Patient complained of numbness on his right side of the face with right arm and hand weakness. Patient's wife called EMS and he was brought to the ED.  Patient's symptoms resolved en route.  Patient is to be on an ASA at home but has not been taking it.  LSN: 12N tPA Given: No: Resolution of symptoms  Past Medical History  Diagnosis Date  . Systolic dysfunction 12/2006    AICD  . HTN (hypertension)   . History of diverticulitis of colon   . GERD (gastroesophageal reflux disease)   . History of nephrolithiasis   . CHF (congestive heart failure)     echo 2010 LVEF 25%; s/p ICD 12/2006  . Urinary retention     foley since july 2012, foley last changed 1 week ago  . Arthritis   . HLD (hyperlipidemia)   . Barrett's esophagus   . Coronary artery disease   . Benign prostatic hypertrophy   . Skin cancer     Past Surgical History  Procedure Date  . Cardiac defibrillator placement 12/2006    AICD  . Total knee arthroplasty 01/04/11    L TKA  . Cardiac catheterization 11-05-10  . Total knee arthroplasty     left  January 04, 2011  . Arthroscopic knee surgery 5-6 yrs ago    both knees  . Joint replacement   . Cataracts removed   . Transurethral resection of prostate 06/14/2011    Procedure: TRANSURETHRAL RESECTION OF THE PROSTATE (TURP);  Surgeon: Valetta Fuller, MD;  Location: [facility];  Service: Urology;  Laterality: N/A;    Family History  Problem Relation Age of Onset  . Arthritis    . Hypertension    . Heart disease     Social History:  reports that he quit smoking about 43 years ago. He has never used smokeless tobacco. He reports that he does not drink alcohol or use illicit  drugs.  Allergies:  Allergies  Allergen Reactions  . Penicillins Itching, Swelling and Rash    Medications:  I have reviewed the patient's current medications. Prior to Admission:  Prescriptions prior to admission  Medication Sig Dispense Refill  . aspirin 81 MG tablet Take 81 mg by mouth every morning.       . carvedilol (COREG) 6.25 MG tablet Take 1 tablet (6.25 mg total) by mouth 2 (two) times daily with a meal.  60 tablet  5  . cyanocobalamin 100 MCG tablet Take 100 mcg by mouth daily.       Marland Kitchen dutasteride (AVODART) 0.5 MG capsule Take 0.5 mg by mouth every morning.       . fluticasone (FLONASE) 50 MCG/ACT nasal spray Place 2 sprays into the nose daily as needed for allergies.  16 g  6  . HYDROcodone-acetaminophen (NORCO) 5-325 MG per tablet Take 1 tablet by mouth every 6 (six) hours as needed. Pain       . isosorbide mononitrate (IMDUR) 30 MG 24 hr tablet Take 30 mg by mouth daily.      Marland Kitchen loratadine (CLARITIN) 10 MG tablet Take 1 tablet (10 mg total) by mouth daily as needed for allergies.  30 tablet  2  . NITROSTAT 0.4 MG SL tablet Place 0.4 mg under the tongue every 5 (five) minutes as needed. For chest pain      . omeprazole (PRILOSEC) 20 MG capsule Take 1 capsule (20 mg total) by mouth daily.  30 capsule  5  . simvastatin (ZOCOR) 40 MG tablet 40 mg every morning.       . solifenacin (VESICARE) 5 MG tablet Take 10 mg by mouth daily.      . Tamsulosin HCl (FLOMAX) 0.4 MG CAPS Take 0.4 mg by mouth daily.      Marland Kitchen sulfamethoxazole-trimethoprim (BACTRIM DS) 800-160 MG per tablet Take 1 tablet by mouth Twice daily.        Scheduled:   . aspirin  324 mg Oral Once  . aspirin  325 mg Oral Daily  . carvedilol  6.25 mg Oral BID WC  . darifenacin  7.5 mg Oral Daily  . dutasteride  0.5 mg Oral Daily  . enoxaparin  40 mg Subcutaneous Q24H  . isosorbide mononitrate  30 mg Oral Daily  . pantoprazole  40 mg Oral Q1200  . simvastatin  40 mg Oral q1800  . Tamsulosin HCl  0.4 mg Oral Daily   . vitamin B-12  100 mcg Oral Daily  . DISCONTD: cyanocobalamin  100 mcg Oral Daily    ROS: History obtained from the patient  General ROS: negative for - chills, fatigue, fever, night sweats, weight gain or weight loss Psychological ROS: negative for - behavioral disorder, hallucinations, memory difficulties, mood swings or suicidal ideation Ophthalmic ROS: negative for - blurry vision, double vision, eye pain or loss of vision ENT ROS: negative for - epistaxis, nasal discharge, oral lesions, sore throat, tinnitus or vertigo Allergy and Immunology ROS: negative for - hives or itchy/watery eyes Hematological and Lymphatic ROS: negative for - bleeding problems, bruising or swollen lymph nodes Endocrine ROS: negative for - galactorrhea, hair pattern changes, polydipsia/polyuria or temperature intolerance Respiratory ROS: negative for - cough, hemoptysis, shortness of breath or wheezing Cardiovascular ROS: negative for - chest pain, dyspnea on exertion, edema or irregular heartbeat Gastrointestinal ROS: negative for - abdominal pain, diarrhea, hematemesis, nausea/vomiting or stool incontinence Genito-Urinary ROS: negative for - dysuria, hematuria, incontinence or urinary frequency/urgency Musculoskeletal ROS: negative for - joint swelling or muscular weakness Neurological ROS: as noted in HPI Dermatological ROS: negative for rash and skin lesion changes  Physical Examination: Blood pressure 154/76, pulse 66, temperature 97.9 F (36.6 C), temperature source Oral, resp. rate 18, height 5\' 9"  (1.753 m), weight 83.462 kg (184 lb), SpO2 96.00%.  Neurologic Examination: Mental Status: Alert, oriented, thought content appropriate.  Speech fluent without evidence of aphasia.  Some slurred speech noted.  Able to follow 3 step commands without difficulty. Cranial Nerves: II: visual fields grossly normal, pupils equal, round, reactive to light and accommodation III,IV, VI: ptosis not present,  extra-ocular motions intact bilaterally V,VII: decrease in the right NLF, facial light touch sensation normal bilaterally VIII: hearing normal bilaterally IX,X: gag reflex present XI: trapezius strength/neck flexion strength normal bilaterally XII: tongue strength normal  Motor: Right : Upper extremity   5/5 with pronator drift, 5-/5 hand grip Left:     Upper extremity   5/5  Lower extremity   5/5                             Lower extremity   5/5 Tone and bulk:normal tone throughout; no atrophy noted Sensory:  Pinprick and light touch intact throughout, bilaterally Deep Tendon Reflexes: 2+ and symmetric throughout Plantars: Right: downgoing   Left: downgoing Cerebellar: normal finger-to-nose and normal heel-to-shin test    Results for orders placed during the hospital encounter of 11/07/11 (from the past 48 hour(s))  GLUCOSE, CAPILLARY     Status: Normal   Collection Time   11/07/11  1:52 PM      Component Value Range Comment   Glucose-Capillary 91  70 - 99 (mg/dL)   CBC     Status: Abnormal   Collection Time   11/07/11  2:16 PM      Component Value Range Comment   WBC 8.2  4.0 - 10.5 (K/uL)    RBC 4.36  4.22 - 5.81 (MIL/uL)    Hemoglobin 10.5 (*) 13.0 - 17.0 (g/dL)    HCT 46.9 (*) 62.9 - 52.0 (%)    MCV 80.3  78.0 - 100.0 (fL)    MCH 24.1 (*) 26.0 - 34.0 (pg)    MCHC 30.0  30.0 - 36.0 (g/dL)    RDW 52.8 (*) 41.3 - 15.5 (%)    Platelets 179  150 - 400 (K/uL)   COMPREHENSIVE METABOLIC PANEL     Status: Abnormal   Collection Time   11/07/11  2:16 PM      Component Value Range Comment   Sodium 141  135 - 145 (mEq/L)    Potassium 4.1  3.5 - 5.1 (mEq/L)    Chloride 108  96 - 112 (mEq/L)    CO2 27  19 - 32 (mEq/L)    Glucose, Bld 74  70 - 99 (mg/dL)    BUN 17  6 - 23 (mg/dL)    Creatinine, Ser 2.44  0.50 - 1.35 (mg/dL)    Calcium 8.6  8.4 - 10.5 (mg/dL)    Total Protein 6.6  6.0 - 8.3 (g/dL)    Albumin 3.4 (*) 3.5 - 5.2 (g/dL)    AST 12  0 - 37 (U/L)    ALT 7  0 - 53 (U/L)     Alkaline Phosphatase 71  39 - 117 (U/L)    Total Bilirubin 0.3  0.3 - 1.2 (mg/dL)    GFR calc non Af Amer 54 (*) >90 (mL/min)    GFR calc Af Amer 62 (*) >90 (mL/min)   CBC     Status: Abnormal   Collection Time   11/07/11  7:09 PM      Component Value Range Comment   WBC 7.4  4.0 - 10.5 (K/uL)    RBC 4.24  4.22 - 5.81 (MIL/uL)    Hemoglobin 10.5 (*) 13.0 - 17.0 (g/dL)    HCT 01.0 (*) 27.2 - 52.0 (%)    MCV 80.9  78.0 - 100.0 (fL)    MCH 24.8 (*) 26.0 - 34.0 (pg)    MCHC 30.6  30.0 - 36.0 (g/dL)    RDW 53.6 (*) 64.4 - 15.5 (%)    Platelets 159  150 - 400 (K/uL)   CREATININE, SERUM     Status: Abnormal   Collection Time   11/07/11  7:09 PM      Component Value Range Comment   Creatinine, Ser 1.21  0.50 - 1.35 (mg/dL)    GFR calc non Af Amer 56 (*) >90 (mL/min)    GFR calc Af Amer 64 (*) >90 (mL/min)   URINALYSIS, ROUTINE W REFLEX MICROSCOPIC     Status: Abnormal   Collection Time   11/07/11  8:11 PM  Component Value Range Comment   Color, Urine YELLOW  YELLOW     APPearance CLEAR  CLEAR     Specific Gravity, Urine 1.025  1.005 - 1.030     pH 5.5  5.0 - 8.0     Glucose, UA NEGATIVE  NEGATIVE (mg/dL)    Hgb urine dipstick NEGATIVE  NEGATIVE     Bilirubin Urine SMALL (*) NEGATIVE     Ketones, ur NEGATIVE  NEGATIVE (mg/dL)    Protein, ur NEGATIVE  NEGATIVE (mg/dL)    Urobilinogen, UA 0.2  0.0 - 1.0 (mg/dL)    Nitrite NEGATIVE  NEGATIVE     Leukocytes, UA TRACE (*) NEGATIVE    URINE MICROSCOPIC-ADD ON     Status: Normal   Collection Time   11/07/11  8:11 PM      Component Value Range Comment   Squamous Epithelial / LPF RARE  RARE     WBC, UA 0-2  <3 (WBC/hpf)    RBC / HPF 0-2  <3 (RBC/hpf)    Bacteria, UA RARE  RARE     Urine-Other MUCOUS PRESENT      Ct Head Wo Contrast  11/07/2011  *RADIOLOGY REPORT*  Clinical Data: TIA  CT HEAD WITHOUT CONTRAST  Technique:  Contiguous axial images were obtained from the base of the skull through the vertex without contrast.   Comparison: None.  Findings: Chronic ischemic changes in the basal ganglia.  Mild global atrophy.  No mass effect, midline shift, or acute intracranial hemorrhage.  Mastoid air cells are clear.  Cranium is intact.  IMPRESSION: No acute intracranial pathology.  Original Report Authenticated By: Donavan Burnet, M.D.    Assessment: 78 y.o. male who presented with complaints of right face and arm weakness and slurred speech. Has only mild findings on exam at this time.  CT unremarkable for an acute event and MRI unable to be performed.  Has likely had a small ischemic left brain event.  It is not surprising that this is not evident on CT.  Patient has been noncompliant with medications.  Stroke Risk Factors - hyperlipidemia and hypertension  Plan: 1. HgbA1c, fasting lipid panel 2. PT consult, OT consult, Speech consult 3. Echocardiogram and interrogation of pacer 5. Carotid dopplers 6. Prophylactic therapy-Antiplatelet med: Aspirin - dose 325mg  daily 7. Risk factor modification 8. Telemetry monitoring 9. Frequent neuro checks   Thana Farr, MD [facility] [phone] 11/07/2011, 8:38 PM

## 2011-11-07 NOTE — ED Notes (Signed)
Onset of TIA sx's at 1250 today, Family called EMS because Pt had numbness on RT side . Pt also had slurred speech. Sx's resolved by the time EMS arrived at house. Pt was able to ambulate to stretcher with stand by assist only. ON arrival to ED Pt was A/O x4 and followed all commands. Pt does have a defib. Device ? Type.

## 2011-11-07 NOTE — ED Notes (Signed)
Wife Corrie Dandy [phone]

## 2011-11-07 NOTE — H&P (Signed)
History and Physical       Hospital Admission Note Date: 11/07/2011  Patient name: Jacob [name] Medical record number: [id number] Date of birth: [birth year] Age: 78 y.o. Gender: male PCP: [name], MD, MD    Chief Complaint:  Right facial drooping with right hand and arm weakness today  HPI: Patient is a 78 year old male with a history of systolic dysfunction, EF 25% (echo 2010), AICD (2008), coronary artery disease, atrial fibrillation (not on Coumadin), Barrett's esophagus, GERD presented to [facility] ED today with symptoms of TIA. History was obtained from the patient and his family members present in the room. Per patient's wife, patient was taking the trash outside today at noon. When he came inside, around 12:30 PM, she noticed that he had right-sided facial drooping and dropped something he was holding in his right hand. Patient complain off numbness on his right side of the face with right arm and hand weakness. Patient's wife called EMS and was brought to the ED where patient's symptoms has resolved. TPA was not given as the patient's symptoms had resolved. Patient otherwise denied any lower extremity weakness, any headache neck pain or any neck stiffness. He denied any change in vision. The patient's wife reported slight slurred speech at the time of this event which has also resolved. Patient states that he is supposed to take aspirin 81mg  daily however he has not been taking it consistently. Patient also has history of atrial fibrillation, however per office note by Dr. Eden Emms on 04/13/2011 to avoid Coumadin given comorbidities and patient had hematuria on pradaxa.   Review of Systems:  Constitutional: Denies fever, chills, diaphoresis, appetite change and fatigue.  HEENT: Denies photophobia, eye pain, redness, hearing loss, ear pain, congestion, sore throat, rhinorrhea, sneezing, mouth sores, trouble swallowing, neck pain,  neck stiffness and tinnitus.   Respiratory: Denies SOB, DOE, cough, chest tightness,  and wheezing.   Cardiovascular: Denies chest pain, palpitations and leg swelling.  Gastrointestinal: Denies nausea, vomiting, abdominal pain, diarrhea, constipation, blood in stool and abdominal distention.  Genitourinary: Denies dysuria, urgency, frequency, hematuria, flank pain and difficulty urinating.  Musculoskeletal: Denies myalgias, back pain, joint swelling, arthralgias and gait problem.  Skin: Denies pallor, rash and wound.  Neurological: Please see history of present illness.  Hematological: Denies adenopathy. Easy bruising, personal or family bleeding history  Psychiatric/Behavioral: Denies suicidal ideation, mood changes, confusion, nervousness, sleep disturbance and agitation  Past Medical History: Past Medical History  Diagnosis Date  . Systolic dysfunction 12/2006    AICD  . HTN (hypertension)   . History of diverticulitis of colon   . GERD (gastroesophageal reflux disease)   . History of nephrolithiasis   . CHF (congestive heart failure)     echo 2010 LVEF 25%; s/p ICD 12/2006  . Urinary retention     foley since july 2012, foley last changed 1 week ago  . Arthritis   . HLD (hyperlipidemia)   . Barrett's esophagus   . Coronary artery disease   . Benign prostatic hypertrophy   . Skin cancer    Past Surgical History  Procedure Date  . Cardiac defibrillator placement 12/2006    AICD  . Total knee arthroplasty 01/04/11    L TKA  . Cardiac catheterization 11-05-10  . Total knee arthroplasty     left  January 04, 2011  . Arthroscopic knee surgery 5-6 yrs ago    both knees  . Joint replacement   . Cataracts removed   . Transurethral resection  of prostate 06/14/2011    Procedure: TRANSURETHRAL RESECTION OF THE PROSTATE (TURP);  Surgeon: Valetta Fuller, MD;  Location: [facility];  Service: Urology;  Laterality: N/A;    Medications: Prior to Admission medications   Medication Sig Start  Date End Date Taking? Authorizing Provider  aspirin 81 MG tablet Take 81 mg by mouth every morning.    Yes Historical Provider, MD  carvedilol (COREG) 6.25 MG tablet Take 1 tablet (6.25 mg total) by mouth 2 (two) times daily with a meal. 06/10/11  Yes Newt Lukes, MD  cyanocobalamin 100 MCG tablet Take 100 mcg by mouth daily.    Yes Historical Provider, MD  dutasteride (AVODART) 0.5 MG capsule Take 0.5 mg by mouth every morning.    Yes Historical Provider, MD  fluticasone (FLONASE) 50 MCG/ACT nasal spray Place 2 sprays into the nose daily as needed for allergies. 05/26/11  Yes Newt Lukes, MD  HYDROcodone-acetaminophen (NORCO) 5-325 MG per tablet Take 1 tablet by mouth every 6 (six) hours as needed. Pain    Yes Historical Provider, MD  isosorbide mononitrate (IMDUR) 30 MG 24 hr tablet Take 30 mg by mouth daily.   Yes Historical Provider, MD  loratadine (CLARITIN) 10 MG tablet Take 1 tablet (10 mg total) by mouth daily as needed for allergies. 08/25/11 08/24/12 Yes Newt Lukes, MD  NITROSTAT 0.4 MG SL tablet Place 0.4 mg under the tongue every 5 (five) minutes as needed. For chest pain 08/13/11  Yes Historical Provider, MD  omeprazole (PRILOSEC) 20 MG capsule Take 1 capsule (20 mg total) by mouth daily. 06/10/11  Yes Newt Lukes, MD  simvastatin (ZOCOR) 40 MG tablet 40 mg every morning.  03/05/11  Yes Wendall Stade, MD  solifenacin (VESICARE) 5 MG tablet Take 10 mg by mouth daily.   Yes Historical Provider, MD  Tamsulosin HCl (FLOMAX) 0.4 MG CAPS Take 0.4 mg by mouth daily.   Yes Historical Provider, MD  sulfamethoxazole-trimethoprim (BACTRIM DS) 800-160 MG per tablet Take 1 tablet by mouth Twice daily.  05/19/11   Historical Provider, MD    Allergies:   Allergies  Allergen Reactions  . Penicillins Itching, Swelling and Rash    Social History:  reports that he quit smoking about 43 years ago. He has never used smokeless tobacco. He reports that he does not drink  alcohol or use illicit drugs. he lives at home with his wife, ambulates with a cane and rarely with a walker  Family History: Family History  Problem Relation Age of Onset  . Arthritis    . Hypertension    . Heart disease      Physical Exam: Blood pressure 140/76, pulse 71, temperature 98 F (36.7 C), temperature source Oral, resp. rate 17, SpO2 97.00%. General: Alert, awake, oriented x3, in no acute distress. HEENT: anicteric sclera, pink conjunctiva, pupils equal and reactive to light and accomodation Neck: supple, no masses or lymphadenopathy, no goiter, no bruits  Heart: Regular rate and rhythm, without murmurs, rubs or gallops. Lungs: Clear to auscultation bilaterally, no wheezing, rales or rhonchi. Abdomen: Soft, nontender, nondistended, positive bowel sounds, no masses. Extremities: No clubbing, cyanosis or edema with positive pedal pulses. Neuro: Grossly intact, no focal neurological deficits, strength 5/5 upper and lower extremities bilaterally, no dysarthria or facial drooping noted Psych: alert and oriented x 3, normal mood and affect Skin: no rashes or lesions, warm and dry   LABS on Admission:  Basic Metabolic Panel:  Lab 11/07/11 6045  NA  141  K 4.1  CL 108  CO2 27  GLUCOSE 74  BUN 17  CREATININE 1.24  CALCIUM 8.6  MG --  PHOS --   Liver Function Tests:  Lab 11/07/11 1416  AST 12  ALT 7  ALKPHOS 71  BILITOT 0.3  PROT 6.6  ALBUMIN 3.4*   CBC:  Lab 11/07/11 1416  WBC 8.2  NEUTROABS --  HGB 10.5*  HCT 35.0*  MCV 80.3  PLT 179  CBG:  Lab 11/07/11 1352  GLUCAP 91     Radiological Exams on Admission: Ct Head Wo Contrast  11/07/2011  *RADIOLOGY REPORT*  Clinical Data: TIA  CT HEAD WITHOUT CONTRAST  Technique:  Contiguous axial images were obtained from the base of the skull through the vertex without contrast.  Comparison: None.  Findings: Chronic ischemic changes in the basal ganglia.  Mild global atrophy.  No mass effect, midline shift, or  acute intracranial hemorrhage.  Mastoid air cells are clear.  Cranium is intact.  IMPRESSION: No acute intracranial pathology.  Original Report Authenticated By: Donavan Burnet, M.D.    Assessment/Plan Present on Admission:   .TIA (transient ischemic attack) - Admit to neuro telemetry floor, follow TIA pathway, serial neuro checks  - Patient has AICD hence unable to obtain MRI/MRA. Will obtain 2-D echocardiogram, carotid Dopplers, CTA if he has any carotid disease. Will obtain repeat CT of the head if patient develops any further neurological symptoms. - Will place him on aspirin 325 mg daily, continue statins, neurology/stroke team evaluation. - Lipid panel, HbA1c, PTOT evaluation  .HYPERCHOLESTEROLEMIA, MIXED - Obtain lipid panel, continue statins.   Marland KitchenHYPERTENSION - Stable, continue oral antihypertensives   .CORONARY ATHEROSCLEROSIS, NATIVE VESSEL/ CAD: Currently asymptomatic - Obtain 2-D echocardiogram, continue aspirin, beta blocker, statins  .Atrial fibrillation - continue beta blocker, per patient's cardiologist, not a Coumadin candidate secondary to his comorbidities and patient had developed hematuria on pradaxa - Continue full dose aspirin.   .CHF: EF of 25% on echo in 2010  - Avoid any fluid overload, continue Coreg, Imdur, obtain 2-D echocardiogram for further workup   .GERD: - Continue PPI   .AUTOMATIC IMPLANTABLE CARDIAC DEFIBRILLATOR SITU: Follows Dr. Sharrell Ku  DVT prophylaxis: Lovenox  CODE STATUS: I discussed in detail with the patient himself, he requested to be DO NOT RESUSCITATE  Further plan will depend as patient's clinical course evolves and further radiologic and laboratory data become available.   @Time  Spent on Admission: 1 hour Keziah Drotar M.D. [facility] 11/07/2011, 4:26 PM

## 2011-11-07 NOTE — ED Provider Notes (Signed)
History     CSN: [id number]  Arrival date & time 11/07/11  1348   First MD Initiated Contact with Patient 11/07/11 1409      Chief Complaint  Patient presents with  . Transient Ischemic Attack    (Consider location/radiation/quality/duration/timing/severity/associated sxs/prior treatment) The history is provided by the patient.  pt states at approximately 1230 today not onset numbness/weakness of right arm and numbness to right side of face. Called ems, symptoms had improved by time ems arrived at home. Pt currently denies numbness/weakness. Denies headache. No neck pain or stiffness. Denies change in vision or speech. Denies problems walking or with balance or coordination. Denies prior hx tia or cva. States is supposed to take an aspirin a day although has not bee taking. Denies trauma or fall. No fever or chills. No cp. No palpitations.   Past Medical History  Diagnosis Date  . Systolic dysfunction 12/2006    AICD  . HTN (hypertension)   . History of diverticulitis of colon   . GERD (gastroesophageal reflux disease)   . History of nephrolithiasis   . CHF (congestive heart failure)     echo 2010 LVEF 25%; s/p ICD 12/2006  . Urinary retention     foley since july 2012, foley last changed 1 week ago  . Arthritis   . HLD (hyperlipidemia)   . Barrett's esophagus   . Coronary artery disease   . Benign prostatic hypertrophy   . Skin cancer     Past Surgical History  Procedure Date  . Cardiac defibrillator placement 12/2006    AICD  . Total knee arthroplasty 01/04/11    L TKA  . Cardiac catheterization 11-05-10  . Total knee arthroplasty     left  January 04, 2011  . Arthroscopic knee surgery 5-6 yrs ago    both knees  . Joint replacement   . Cataracts removed   . Transurethral resection of prostate 06/14/2011    Procedure: TRANSURETHRAL RESECTION OF THE PROSTATE (TURP);  Surgeon: Valetta Fuller, MD;  Location: [facility];  Service: Urology;  Laterality: N/A;    Family History    Problem Relation Age of Onset  . Arthritis    . Hypertension    . Heart disease      History  Substance Use Topics  . Smoking status: Former Smoker -- 2.0 packs/day for 30 years    Quit date: 06/14/1968  . Smokeless tobacco: Never Used  . Alcohol Use: No      Review of Systems  Constitutional: Negative for fever.  HENT: Negative for neck pain.   Eyes: Negative for visual disturbance.  Respiratory: Negative for shortness of breath.   Cardiovascular: Negative for chest pain and palpitations.  Gastrointestinal: Negative for nausea, vomiting and abdominal pain.  Genitourinary: Negative for dysuria and flank pain.  Musculoskeletal: Negative for back pain.  Skin: Negative for rash.  Neurological: Positive for numbness.  Hematological: Does not bruise/bleed easily.  Psychiatric/Behavioral: Negative for confusion.    Allergies  Penicillins  Home Medications   Current Outpatient Rx  Name Route Sig Dispense Refill  . ASPIRIN 81 MG PO TABS Oral Take 81 mg by mouth every morning.     Marland Kitchen CARVEDILOL 6.25 MG PO TABS Oral Take 1 tablet (6.25 mg total) by mouth 2 (two) times daily with a meal. 60 tablet 5  . CARVEDILOL 6.25 MG PO TABS  TAKE 1 TABLET BY MOUTH TWICE DAILY 60 tablet 5  . CYANOCOBALAMIN 100 MCG PO TABS Oral  Take 100 mcg by mouth daily.     . DUTASTERIDE 0.5 MG PO CAPS Oral Take 0.5 mg by mouth every morning.     Marland Kitchen FLUTICASONE PROPIONATE 50 MCG/ACT NA SUSP Nasal Place 2 sprays into the nose daily as needed for allergies. 16 g 6  . GUAIFENESIN ER 600 MG PO TB12 Oral Take 2 tablets (1,200 mg total) by mouth 2 (two) times daily.    Marland Kitchen HYDROCODONE-ACETAMINOPHEN 5-325 MG PO TABS Oral Take 1 tablet by mouth every 6 (six) hours as needed. Pain     . ISOSORBIDE MONONITRATE ER 30 MG PO TB24  TAKE 1 TABLET BY MOUTH EVERY DAY 30 tablet 5  . LORATADINE 10 MG PO TABS Oral Take 1 tablet (10 mg total) by mouth daily as needed for allergies. 30 tablet 2  . NITROSTAT 0.4 MG SL SUBL      .  OMEPRAZOLE 20 MG PO CPDR Oral Take 1 capsule (20 mg total) by mouth daily. 30 capsule 5  . SIMVASTATIN 40 MG PO TABS  40 mg every morning.     Marland Kitchen SOLIFENACIN SUCCINATE 5 MG PO TABS Oral Take 10 mg by mouth daily.    . SULFAMETHOXAZOLE-TMP DS 800-160 MG PO TABS Oral Take 1 tablet by mouth Twice daily.     Marland Kitchen TAMSULOSIN HCL 0.4 MG PO CAPS  TAKE ONE CAPSULE BY MOUTH EVERY DAY 30 capsule 6    BP 140/76  Pulse 71  Temp(Src) 98.1 F (36.7 C) (Oral)  Resp 17  SpO2 97%  Physical Exam  Nursing note and vitals reviewed. Constitutional: He is oriented to person, place, and time. He appears well-developed and well-nourished. No distress.  HENT:  Head: Atraumatic.  Eyes: Pupils are equal, round, and reactive to light.  Neck: Neck supple. No tracheal deviation present.       No bruit  Cardiovascular: Normal rate, regular rhythm, normal heart sounds and intact distal pulses.  Exam reveals no gallop and no friction rub.   No murmur heard. Pulmonary/Chest: Effort normal and breath sounds normal. No accessory muscle usage. No respiratory distress.  Abdominal: Soft. He exhibits no distension. There is no tenderness.  Musculoskeletal: Normal range of motion. He exhibits no edema and no tenderness.  Neurological: He is alert and oriented to person, place, and time. No cranial nerve deficit.       Motor intact bil, 5/5. sens touch/prick intact bil. No pronator drift.   Skin: Skin is warm and dry.  Psychiatric: He has a normal mood and affect.    ED Course  Procedures (including critical care time)   Labs Reviewed  CBC  COMPREHENSIVE METABOLIC PANEL   Results for orders placed during the hospital encounter of 11/07/11  CBC      Component Value Range   WBC 8.2  4.0 - 10.5 (K/uL)   RBC 4.36  4.22 - 5.81 (MIL/uL)   Hemoglobin 10.5 (*) 13.0 - 17.0 (g/dL)   HCT 47.8 (*) 29.5 - 52.0 (%)   MCV 80.3  78.0 - 100.0 (fL)   MCH 24.1 (*) 26.0 - 34.0 (pg)   MCHC 30.0  30.0 - 36.0 (g/dL)   RDW 62.1 (*)  30.8 - 15.5 (%)   Platelets 179  150 - 400 (K/uL)  COMPREHENSIVE METABOLIC PANEL      Component Value Range   Sodium 141  135 - 145 (mEq/L)   Potassium 4.1  3.5 - 5.1 (mEq/L)   Chloride 108  96 - 112 (mEq/L)  CO2 27  19 - 32 (mEq/L)   Glucose, Bld 74  70 - 99 (mg/dL)   BUN 17  6 - 23 (mg/dL)   Creatinine, Ser 9.60  0.50 - 1.35 (mg/dL)   Calcium 8.6  8.4 - 45.4 (mg/dL)   Total Protein 6.6  6.0 - 8.3 (g/dL)   Albumin 3.4 (*) 3.5 - 5.2 (g/dL)   AST 12  0 - 37 (U/L)   ALT 7  0 - 53 (U/L)   Alkaline Phosphatase 71  39 - 117 (U/L)   Total Bilirubin 0.3  0.3 - 1.2 (mg/dL)   GFR calc non Af Amer 54 (*) >90 (mL/min)   GFR calc Af Amer 62 (*) >90 (mL/min)  GLUCOSE, CAPILLARY      Component Value Range   Glucose-Capillary 91  70 - 99 (mg/dL)   Ct Head Wo Contrast  11/07/2011  *RADIOLOGY REPORT*  Clinical Data: TIA  CT HEAD WITHOUT CONTRAST  Technique:  Contiguous axial images were obtained from the base of the skull through the vertex without contrast.  Comparison: None.  Findings: Chronic ischemic changes in the basal ganglia.  Mild global atrophy.  No mass effect, midline shift, or acute intracranial hemorrhage.  Mastoid air cells are clear.  Cranium is intact.  IMPRESSION: No acute intracranial pathology.  Original Report Authenticated By: Donavan Burnet, M.D.       MDM  Iv ns. Monitor. Labs. Ecg. Stat head ct.   Ct neg. Symptoms remain improved/resolved.  Asa po.  pts family/wife now present. Spouse states noted a marked facial droop with slurred speech, and right arm not working/dropping things earlier, felt c/w tia, therefore will admit to med service.          Suzi Roots, MD 11/07/11 [phone]

## 2011-11-08 ENCOUNTER — Encounter (HOSPITAL_COMMUNITY): Payer: Self-pay | Admitting: *Deleted

## 2011-11-08 DIAGNOSIS — R4789 Other speech disturbances: Secondary | ICD-10-CM

## 2011-11-08 DIAGNOSIS — I10 Essential (primary) hypertension: Secondary | ICD-10-CM

## 2011-11-08 DIAGNOSIS — G459 Transient cerebral ischemic attack, unspecified: Secondary | ICD-10-CM | POA: Diagnosis not present

## 2011-11-08 DIAGNOSIS — G8190 Hemiplegia, unspecified affecting unspecified side: Secondary | ICD-10-CM

## 2011-11-08 DIAGNOSIS — I6340 Cerebral infarction due to embolism of unspecified cerebral artery: Secondary | ICD-10-CM

## 2011-11-08 DIAGNOSIS — I25810 Atherosclerosis of coronary artery bypass graft(s) without angina pectoris: Secondary | ICD-10-CM | POA: Diagnosis not present

## 2011-11-08 DIAGNOSIS — I4891 Unspecified atrial fibrillation: Secondary | ICD-10-CM | POA: Diagnosis not present

## 2011-11-08 LAB — GLUCOSE, CAPILLARY: Glucose-Capillary: 138 mg/dL — ABNORMAL HIGH (ref 70–99)

## 2011-11-08 LAB — LIPID PANEL
LDL Cholesterol: 65 mg/dL (ref 0–99)
Triglycerides: 127 mg/dL (ref <150)
VLDL: 25 mg/dL (ref 0–40)

## 2011-11-08 MED ORDER — CARVEDILOL 12.5 MG PO TABS
12.5000 mg | ORAL_TABLET | Freq: Two times a day (BID) | ORAL | Status: DC
  Filled 2011-11-08 (×2): qty 1

## 2011-11-08 MED ORDER — ASPIRIN 325 MG PO TABS: 325.0000 mg | ORAL_TABLET | Freq: Every day | ORAL | Status: DC

## 2011-11-08 MED ORDER — CARVEDILOL 6.25 MG PO TABS: 12.5000 mg | ORAL_TABLET | Freq: Two times a day (BID) | ORAL | Status: DC

## 2011-11-08 NOTE — Progress Notes (Signed)
SUBJECTIVE  Jacob [name] is a 78 y.o. male  who was taking the trash outside on 11/07/11 at noon. When he came inside, around 12:30 PM, he noticed that he had right-sided facial drooping and dropped something he was holding in his right hand. Patient complained of numbness on his right side of the face with right arm and hand weakness. Patient's wife called EMS and he was brought to the ED. Patient's symptoms resolved en route. Patient is to be on an ASA at home but has not been taking it.  LSN: 12N  tPA Given: No: Resolution of symptoms He reports no recurrent symptoms overnight and says he his at his baseline.he denies prior h/o stroke or TIAs but has risk factors of HT, Hyperlipidimia, CHF  OBJECTIVE Most recent Vital Signs:   Respiratory Rate: 20 O2 Saturdation: 96%  CBG (last 3)   Basename 11/08/11 0028 11/07/11 1352  GLUCAP 138* 91   Intake/Output from previous day:    IV Fluid Intake:     . sodium chloride 10 mL/hr (11/07/11 1448)   Diet:  Cardiac   Activity:  Up with assistance   DVT Prophylaxis:  SCDs  Studies: CBC    Component Value Date/Time   WBC 7.4 11/07/2011 1909   RBC 4.24 11/07/2011 1909   HGB 10.5* 11/07/2011 1909   HCT 34.3* 11/07/2011 1909   PLT 159 11/07/2011 1909   MCV 80.9 11/07/2011 1909   MCH 24.8* 11/07/2011 1909   MCHC 30.6 11/07/2011 1909   RDW 16.5* 11/07/2011 1909   LYMPHSABS 1.7 05/17/2011 1511   MONOABS 0.8 05/17/2011 1511   EOSABS 0.1 05/17/2011 1511   BASOSABS 0.0 05/17/2011 1511   CMP    Component Value Date/Time   NA 141 11/07/2011 1416   K 4.1 11/07/2011 1416   CL 108 11/07/2011 1416   CO2 27 11/07/2011 1416   GLUCOSE 74 11/07/2011 1416   BUN 17 11/07/2011 1416   CREATININE 1.21 11/07/2011 1909   CALCIUM 8.6 11/07/2011 1416   PROT 6.6 11/07/2011 1416   ALBUMIN 3.4* 11/07/2011 1416   AST 12 11/07/2011 1416   ALT 7 11/07/2011 1416   ALKPHOS 71 11/07/2011 1416   BILITOT 0.3 11/07/2011 1416   GFRNONAA 56* 11/07/2011 1909   GFRAA 64* 11/07/2011  1909   COAGS Lab Results  Component Value Date   INR 1.19 12/30/2010   INR 1.3* 11/02/2010   INR 1.1 12/17/2008   Lipid Panel    Component Value Date/Time   CHOL 119 11/08/2011 0500   TRIG 127 11/08/2011 0500   HDL 29* 11/08/2011 0500   CHOLHDL 4.1 11/08/2011 0500   VLDL 25 11/08/2011 0500   LDLCALC 65 11/08/2011 0500   HgbA1C  No results found for this basename: HGBA1C   Urine Drug Screen  No results found for this basename: labopia, cocainscrnur, labbenz, amphetmu, thcu, labbarb    Alcohol Level No results found for this basename: eth     Ct Head Wo Contrast  11/07/2011  *RADIOLOGY REPORT*  Clinical Data: TIA  CT HEAD WITHOUT CONTRAST  Technique:  Contiguous axial images were obtained from the base of the skull through the vertex without contrast.  Comparison: None.  Findings: Chronic ischemic changes in the basal ganglia.  Mild global atrophy.  No mass effect, midline shift, or acute intracranial hemorrhage.  Mastoid air cells are clear.  Cranium is intact.  IMPRESSION: No acute intracranial pathology.  Original Report Authenticated By: Donavan Burnet, M.D.  2D Echocardiogram: pending Carotid Doppler:  pending     EKG:  ATRIAL-PACED COMPLEXES ~ other complexes also detected VENTRICULAR PREMATURE COMPLEX ~ V complex w/ short R-R interval NONSPECIFIC INTRAVENTRICULAR CONDUCTION DELAY ~ Q Physical Exam:  Pleasant elderly caucasian male not in distress.Awake alert. Afebrile. Head is nontraumatic. Neck is supple without bruit. Hearing is normal. Cardiac exam no murmur or gallop. Lungs are clear to auscultation. Distal pulses are well felt.  Neurological Exam :Awake alert oriented x 3 normal speech and language.Eye movements are full. Visual Acuity and fields are adequate.Fundi could not be visualized.. Mild right lower face asymmetry. Tongue midline. No drift. Mild diminished fine finger movements on right Orbits left over right upper extremity.  . Normal sensation . Normal  coordination. Gait deferred ASSESSMENT [name] is a 78 y.o. male with a  small left brain subcortical infarct/TIA, secondary to small vessel disease likely, on no antiplatelets prior to admission for secondary stroke prevention.  Stroke risk factors:  hyperlipidemia, Hypertension  Hospital day # 1  TREATMENT/PLAN Continue aspirin 325 mg orally every day for stroke prevention. Check ECHO, dopplers, lipids, HbA1c, PT, OT consults.Check TCD as cannot do MRA.Stroke Team will follow  Gates Rigg, MD [facility] Pager: [phone] 11/08/2011 10:41 AM

## 2011-11-08 NOTE — Evaluation (Signed)
Physical Therapy Evaluation Patient Details Name: Jacob [name] MRN: [id number] DOB: 07-05-1931 Today's Date: 11/08/2011 Time: 4098-1191 PT Time Calculation (min): 22 min  PT Assessment / Plan / Recommendation Clinical Impression  pt rpesents to R/O TIA.  pt with balance deficits and mild cognitive deficits/inconsistencies.  pt would benefit from OPPT at D/C for balance and safety.      PT Assessment  Patient needs continued PT services    Follow Up Recommendations  Outpatient PT;Supervision - Intermittent    Barriers to Discharge        lEquipment Recommendations  None recommended by PT    Recommendations for Other Services     Frequency Min 4X/week    Precautions / Restrictions Precautions Precautions: Fall Restrictions Weight Bearing Restrictions: No   Pertinent Vitals/Pain No c/o      Mobility  Bed Mobility Bed Mobility: Supine to Sit;Sitting - Scoot to Edge of Bed Supine to Sit: 6: Modified independent (Device/Increase time);With rails Sitting - Scoot to Edge of Bed: 7: Independent Transfers Transfers: Sit to Stand;Stand to Sit Sit to Stand: 5: Supervision;With upper extremity assist;From bed Stand to Sit: 5: Supervision;With upper extremity assist;With armrests;To chair/3-in-1 Details for Transfer Assistance: Demos good technique Ambulation/Gait Ambulation/Gait Assistance: 5: Supervision Ambulation Distance (Feet): 300 Feet Assistive device: None Ambulation/Gait Assistance Details: moves very slowly and very rigid, cautious steps Gait Pattern: Step-through pattern;Decreased stride length;Wide base of support Stairs: Yes Stairs Assistance: 5: Supervision Stairs Assistance Details (indicate cue type and reason): demos good technique, moves slowly Stair Management Technique: Two rails;Forwards Number of Stairs: 3  Wheelchair Mobility Wheelchair Mobility: No Modified Rankin (Stroke Patients Only) Pre-Morbid Rankin Score: No symptoms Modified Rankin:  Moderate disability    Exercises     PT Diagnosis: Difficulty walking  PT Problem List: Decreased activity tolerance;Decreased balance;Decreased mobility;Decreased knowledge of use of DME PT Treatment Interventions: DME instruction;Gait training;Stair training;Functional mobility training;Therapeutic activities;Therapeutic exercise;Balance training;Neuromuscular re-education;Patient/family education   PT Goals Acute Rehab PT Goals PT Goal Formulation: With patient Time For Goal Achievement: 11/15/11 Potential to Achieve Goals: Good Pt will go Supine/Side to Sit: Independently PT Goal: Supine/Side to Sit - Progress: Goal set today Pt will go Sit to Supine/Side: Independently PT Goal: Sit to Supine/Side - Progress: Goal set today Pt will go Sit to Stand: with modified independence PT Goal: Sit to Stand - Progress: Goal set today Pt will go Stand to Sit: with modified independence PT Goal: Stand to Sit - Progress: Goal set today Pt will Ambulate: >150 feet;with modified independence;with least restrictive assistive device PT Goal: Ambulate - Progress: Goal set today Pt will Go Up / Down Stairs: 3-5 stairs;with modified independence;with rail(s) PT Goal: Up/Down Stairs - Progress: Goal set today  Visit Information  Last PT Received On: 11/08/11 Assistance Needed: +1    Subjective Data  Subjective: I just feel a little clumsy.   Patient Stated Goal: Home   Prior Functioning  Home Living Lives With: Spouse;Son;Daughter (3 Grandchildren) Available Help at Discharge: Family;Available 24 hours/day (wife, son, dtr, 3grandchildren) Type of Home: House Home Access: Stairs to enter [employer] of Steps: 1 Home Layout: One level (3-4 steps to bedrooms) Bathroom Shower/Tub: Tub/shower unit;Curtain Bathroom Toilet: Handicapped height Home Adaptive Equipment: Walker - rolling;Walker - four wheeled;Straight cane;Bedside commode/3-in-1;Tub transfer bench Additional Comments: Pt  reports that wife wears O2 at home.   Prior Function Level of Independence: Independent Able to Take Stairs?: Yes Driving: Yes Vocation: Retired Musician: Other (comment) (increased time, possible difficulty with  word finding) Dominant Hand: Right    Cognition  Overall Cognitive Status: Impaired (unsure if this is baseline) Area of Impairment:  (increased time for processing) Arousal/Alertness: Awake/alert Orientation Level: Appears intact for tasks assessed Behavior During Session: [name] for tasks performed Memory Deficits: pt with inconsistent answers about home set-up and deficits.      Extremity/Trunk Assessment Right Upper Extremity Assessment RUE ROM/Strength/Tone: Within functional levels RUE Sensation: WFL - Light Touch;WFL - Proprioception RUE Coordination: WFL - gross/fine motor Left Upper Extremity Assessment LUE ROM/Strength/Tone: Within functional levels LUE Sensation: WFL - Light Touch;WFL - Proprioception LUE Coordination: WFL - gross/fine motor Right Lower Extremity Assessment RLE ROM/Strength/Tone: Within functional levels RLE Sensation: WFL - Light Touch Left Lower Extremity Assessment LLE ROM/Strength/Tone: Within functional levels LLE Sensation: WFL - Light Touch   Balance Balance Balance Assessed: No Standardized Balance Assessment Standardized Balance Assessment: Dynamic Gait Index Dynamic Gait Index Level Surface: Mild Impairment Change in Gait Speed: Mild Impairment Gait with Horizontal Head Turns: Mild Impairment Gait with Vertical Head Turns: Mild Impairment Gait and Pivot Turn: Mild Impairment Step Over Obstacle: Moderate Impairment Step Around Obstacles: Mild Impairment Steps: Mild Impairment Total Score: 15   End of Session PT - End of Session Equipment Utilized During Treatment: Gait belt Activity Tolerance: Patient tolerated treatment well Patient left: in chair;with call bell/phone within reach Nurse Communication:  Mobility status   Sunny Schlein, [place] [phone] 11/08/2011, 10:37 AM

## 2011-11-08 NOTE — Plan of Care (Signed)
Problem: Phase I Progression Outcomes Goal: Pain controlled with appropriate interventions Outcome: Not Applicable Date Met:  11/08/11 No c/o of pain voiced

## 2011-11-08 NOTE — Progress Notes (Signed)
  Echocardiogram 2D Echocardiogram has been performed.  Jacob [name] [facility] 11/08/2011, 9:29 AM

## 2011-11-08 NOTE — Progress Notes (Signed)
Stroke education given to pt the primary learner and other family members at bed side. Pt and family verbalized understanding and demonstrated understanding  by stating 2 of his risk factors & how to modify  those with teach back. Jacob KitchenMarland [name]

## 2011-11-08 NOTE — Discharge Summary (Signed)
Physician Discharge Summary  Patient ID: Jacob [name] MRN: [id number] DOB/AGE: [birth year] 78 y.o.  Admit date: 11/07/2011 Discharge date: 11/08/2011  Primary Care Physician:  [name], MD, MD  Discharge Diagnoses:    .TIA (transient ischemic attack) symptoms resolved  .HYPERCHOLESTEROLEMIA, MIXED .HYPERTENSION .CORONARY ATHEROSCLEROSIS, NATIVE VESSEL .Atrial fibrillation on aspirin  .CHF .GERD .AUTOMATIC IMPLANTABLE CARDIAC DEFIBRILLATOR SITU  Consults:  Neurology, Dr Pearlean Brownie    Discharge Medications: Medication List  As of 11/08/2011  2:03 PM   STOP taking these medications         sulfamethoxazole-trimethoprim 800-160 MG per tablet         TAKE these medications         aspirin 325 MG tablet   Take 1 tablet (325 mg total) by mouth daily.      AVODART 0.5 MG capsule   Generic drug: dutasteride   Take 0.5 mg by mouth every morning.      carvedilol 6.25 MG tablet   Commonly known as: COREG   Take 2 tablets (12.5 mg total) by mouth 2 (two) times daily with a meal.      cyanocobalamin 100 MCG tablet   Take 100 mcg by mouth daily.      fluticasone 50 MCG/ACT nasal spray   Commonly known as: FLONASE   Place 2 sprays into the nose daily as needed for allergies.      HYDROcodone-acetaminophen 5-325 MG per tablet   Commonly known as: NORCO   Take 1 tablet by mouth every 6 (six) hours as needed. Pain        isosorbide mononitrate 30 MG 24 hr tablet   Commonly known as: IMDUR   Take 30 mg by mouth daily.      loratadine 10 MG tablet   Commonly known as: CLARITIN   Take 1 tablet (10 mg total) by mouth daily as needed for allergies.      NITROSTAT 0.4 MG SL tablet   Generic drug: nitroGLYCERIN   Place 0.4 mg under the tongue every 5 (five) minutes as needed. For chest pain      omeprazole 20 MG capsule   Commonly known as: PRILOSEC   Take 1 capsule (20 mg total) by mouth daily.      simvastatin 40 MG tablet   Commonly known as: ZOCOR   40 mg every  morning.      solifenacin 5 MG tablet   Commonly known as: VESICARE   Take 10 mg by mouth daily.      Tamsulosin HCl 0.4 MG Caps   Commonly known as: FLOMAX   Take 0.4 mg by mouth daily.             Brief H and P: For complete details please refer to admission H and P, but in brief Patient is a 78 year old male with a history of systolic dysfunction, EF 25% (echo 2010), AICD (2008), coronary artery disease, atrial fibrillation (not on Coumadin), Barrett's esophagus, GERD presented to [facility] ED today with symptoms of TIA. History was obtained from the patient and his family members present in the room. Per patient's wife, patient was taking the trash outside on the day of admission at noon. When he came inside, around 12:30 PM, she noticed that he had right-sided facial drooping and dropped something he was holding in his right hand. Patient complain off numbness on his right side of the face with right arm and hand weakness. Patient's wife called EMS and was brought to the  ED where patient's symptoms has resolved. TPA was not given as the patient's symptoms had resolved. Patient otherwise denied any lower extremity weakness, any headache neck pain or any neck stiffness. He denied any change in vision. The patient's wife reported slight slurred speech at the time of this event which has also resolved. Patient states that he is supposed to take aspirin 81mg  daily however he has not been taking it consistently.  Patient also has history of atrial fibrillation, however per office note by Dr. Eden Emms on 04/13/2011 to avoid Coumadin given comorbidities and patient had hematuria on pradaxa   Hospital Course:   TIA (transient ischemic attack): Patient's symptoms were completely resolved at the time of admission, hence he did not receive any TPA. He was admitted to neuro tele floor. Serial neuro checks were obtained and patient did not have any relapse of the symptoms. Patient has AICD hence unable  to obtain MRI/MRA. 2-D echocardiogram was obtained which showed EF of 45-50% (improved from his previous EF of 30% demonstrated on echo in 11/2006). carotid Dopplers were done which showed no carotid artery disease. TCD's were done and the results will be followed by Dr Pearlean Brownie (Neurology-stroke service). The patient did well with PTOT evaluation. He was placed on full dose aspirin 325 mg daily and was counseled strongly to be consistent with the aspirin.  Marland KitchenHYPERCHOLESTEROLEMIA, MIXED: Lipid panel showed good control with LDL of 65 triglycerides 127, patient was continued on statins.   Marland KitchenHYPERTENSION: BP somewhat elevated, hence Coreg was increased to 12.5 mg BID    .CORONARY ATHEROSCLEROSIS, NATIVE VESSEL/ CAD: Remained asymptomatic, patient was continued on aspirin, beta blocker and statins  .Atrial fibrillation: Patient was continued on beta blocker, per patient's cardiologist, he is not a Coumadin candidate secondary to his comorbidities and patient had developed hematuria on pradaxa, hence continue full dose aspirin.   .CHF: EF of 25% on echo in 2010   .GERD: Patient was continued on PPI  .AUTOMATIC IMPLANTABLE CARDIAC DEFIBRILLATOR SITU: Follows Dr. Sharrell Ku, he has an appointment within this week for ICD check.   Day of Discharge BP 150/67  Pulse 65  Temp(Src) 98.7 F (37.1 C) (Oral)  Resp 20  Ht 5\' 9"  (1.753 m)  Wt 83.462 kg (184 lb)  BMI 27.17 kg/m2  SpO2 97%  Physical Exam: General: Alert and awake oriented x3 not in any acute distress. HEENT: anicteric sclera, pupils reactive to light and accommodation CVS: S1-S2 clear no murmur rubs or gallops Chest: clear to auscultation bilaterally, no wheezing rales or rhonchi Abdomen: soft nontender, nondistended, normal bowel sounds, no organomegaly Extremities: no cyanosis, clubbing or edema noted bilaterally Neuro: Cranial nerves II-XII intact, no focal neurological deficits   The results of significant diagnostics from this  hospitalization (including imaging, microbiology, ancillary and laboratory) are listed below for reference.    LAB RESULTS: Basic Metabolic Panel:  Lab 11/07/11 8119 11/07/11 1416  NA -- 141  K -- 4.1  CL -- 108  CO2 -- 27  GLUCOSE -- 74  BUN -- 17  CREATININE 1.21 1.24  CALCIUM -- 8.6  MG -- --  PHOS -- --   Liver Function Tests:  Lab 11/07/11 1416  AST 12  ALT 7  ALKPHOS 71  BILITOT 0.3  PROT 6.6  ALBUMIN 3.4*   CBC:  Lab 11/07/11 1909 11/07/11 1416  WBC 7.4 8.2  NEUTROABS -- --  HGB 10.5* 10.5*  HCT 34.3* 35.0*  MCV 80.9 --  PLT 159 179   CBG:  Lab 11/08/11 0028 11/07/11 1352  GLUCAP 138* 91    Significant Diagnostic Studies:  Ct Head Wo Contrast  11/07/2011  *RADIOLOGY REPORT*  Clinical Data: TIA  CT HEAD WITHOUT CONTRAST  Technique:  Contiguous axial images were obtained from the base of the skull through the vertex without contrast.  Comparison: None.  Findings: Chronic ischemic changes in the basal ganglia.  Mild global atrophy.  No mass effect, midline shift, or acute intracranial hemorrhage.  Mastoid air cells are clear.  Cranium is intact.  IMPRESSION: No acute intracranial pathology.  Original Report Authenticated By: Donavan Burnet, M.D.     Disposition and Follow-up: Discharge Orders    Future Appointments: Provider: Department: Dept Phone: Center:   11/10/2011 10:30 AM [facility] [phone] [facility]   12/02/2011 10:45 AM Newt Lukes, MD [facility] [phone] [facility]     Future Orders Please Complete By Expires   Diet - low sodium heart healthy      Increase activity slowly          DISPOSITION: Home  DIET: Heart healthy  ACTIVITY: As tolerated  TESTS THAT NEED FOLLOW-UP TCD results by Dr. Pearlean Brownie  DISCHARGE FOLLOW-UP Follow-up Information    Follow up with [name], MD. Schedule an appointment as soon as possible for a visit in 10 days. (for hospital follow-up)    Contact information:     [address] [facility] [address] [place] [address] [phone]       Follow up with Gates Rigg, MD. Schedule an appointment as soon as possible for a visit in 6 weeks. (for follow-up)    Contact information:   [address] [facility] [place] [address] [phone]       Follow up with Lewayne Bunting, MD. Schedule an appointment as soon as possible for a visit in 7 days. (for follow-up and ICD check)    Contact information:   [address] [place] [address] [phone]          Time spent on Discharge: 45 minutes  Signed:  Juron Vorhees M.D. [facility] 11/08/2011, 2:03 PM

## 2011-11-08 NOTE — Progress Notes (Signed)
Occupational Therapy Evaluation Patient Details Name: Jacob [name] MRN: [id number] DOB: November 18, 1931 Today's Date: 11/08/2011 Time: 0865-7846 OT Time Calculation (min): 18 min  OT Assessment / Plan / Recommendation Clinical Impression  Pt admitted with right side facial numbness and right UE weakness which has now resolved.   At this time, pt at baseline with BADLs. Pt educated on stroke signs and symptoms.  No further acute OT needs at this time.  Recommend d/c home with no OT f/u.      OT Assessment  Patient does not need any further OT services    Follow Up Recommendations  Supervision/Assistance - 24 hour;No OT follow up    Barriers to Discharge      Equipment Recommendations  None recommended by OT    Recommendations for Other Services Speech consult  Frequency       Precautions / Restrictions     Pertinent Vitals/Pain N/A    ADL  Eating/Feeding: Performed;Modified independent Where Assessed - Eating/Feeding: Chair Grooming: Performed;Wash/dry hands;Wash/dry face;Supervision/safety Where Assessed - Grooming: Unsupported standing Upper Body Bathing: Simulated;Set up Where Assessed - Upper Body Bathing: Unsupported sitting Lower Body Bathing: Simulated;Set up Where Assessed - Lower Body Bathing: Unsupported sit to stand Upper Body Dressing: Simulated;Set up Where Assessed - Upper Body Dressing: Unsupported sitting Lower Body Dressing: Performed;Set up Where Assessed - Lower Body Dressing: Unsupported sit to stand Toilet Transfer: Simulated;Min guard Toilet Transfer Method:  (ambulating) Acupuncturist:  (chair) Transfers/Ambulation Related to ADLs: Ambulated with min guard for safety.  No LOB but required increased time. ADL Comments: Pt appears at baseline for BADLs.    OT Diagnosis:    OT Problem List:   OT Treatment Interventions:     OT Goals    Visit Information  Last OT Received On: 11/08/11 Assistance Needed: +1    Subjective Data  Subjective: I feel a little weak.   Prior Functioning  Home Living Lives With: Spouse Available Help at Discharge: Family;Available 24 hours/day (wife) Type of Home: House Home Access: Stairs to enter [employer] of Steps: 1 Home Layout: One level (3-4 steps to bedrooms) Bathroom Shower/Tub: Tub/shower unit;Curtain Bathroom Toilet: Handicapped height Home Adaptive Equipment: Walker - rolling;Walker - four wheeled;Straight cane;Bedside commode/3-in-1;Tub transfer bench Additional Comments: Pt reports that wife wears O2 at home.   Prior Function Level of Independence: Independent Driving: Yes Vocation: Retired Musician: Other (comment) (increased time, possible difficulty with word finding) Dominant Hand: Right    Cognition  Overall Cognitive Status: Impaired (unsure if this is baseline) Area of Impairment:  (increased time for processing) Arousal/Alertness: Awake/alert Orientation Level: Appears intact for tasks assessed Behavior During Session: [facility] for tasks performed    Extremity/Trunk Assessment Right Upper Extremity Assessment RUE ROM/Strength/Tone: Within functional levels RUE Sensation: WFL - Light Touch;WFL - Proprioception RUE Coordination: WFL - gross/fine motor Left Upper Extremity Assessment LUE ROM/Strength/Tone: Within functional levels LUE Sensation: WFL - Light Touch;WFL - Proprioception LUE Coordination: WFL - gross/fine motor   Mobility Bed Mobility Bed Mobility: Not assessed (pt up in chair) Transfers Transfers: Sit to Stand;Stand to Sit Sit to Stand: 5: Supervision;From chair/3-in-1;With armrests;With upper extremity assist Stand to Sit: 5: Supervision;To chair/3-in-1;With armrests;With upper extremity assist Details for Transfer Assistance: Supervision for safety. Demonstrated safe technique   Exercise    Balance    End of Session OT - End of Session Equipment Utilized During Treatment: Gait belt Activity Tolerance:  Patient tolerated treatment well Patient left: in chair;with call bell/phone within reach Nurse  Communication: Mobility status  11/08/2011 Cipriano Mile OTR/L Pager [phone] Office [phone]   Cipriano Mile 11/08/2011, 8:46 AM

## 2011-11-08 NOTE — Progress Notes (Signed)
Patient is discharge today after basic tests came back ok, assessments remained unchanged prior to discharge. He is wheeled down accompany by family members.

## 2011-11-08 NOTE — Progress Notes (Signed)
VASCULAR LAB PRELIMINARY  PRELIMINARY  PRELIMINARY  PRELIMINARY  Carotid duplex and TCD  completed.    Preliminary report:  Bilateral:  No evidence of hemodynamically significant internal carotid artery stenosis.   Vertebral artery flow is antegrade.     Atlas Kuc D, RVS  11/08/2011, 1:54 PM

## 2011-11-10 ENCOUNTER — Encounter: Payer: Self-pay | Admitting: Internal Medicine

## 2011-11-10 ENCOUNTER — Ambulatory Visit (INDEPENDENT_AMBULATORY_CARE_PROVIDER_SITE_OTHER): Payer: Medicare Other | Admitting: *Deleted

## 2011-11-10 DIAGNOSIS — I509 Heart failure, unspecified: Secondary | ICD-10-CM

## 2011-11-10 DIAGNOSIS — I428 Other cardiomyopathies: Secondary | ICD-10-CM | POA: Diagnosis not present

## 2011-11-10 LAB — ICD DEVICE OBSERVATION
AL IMPEDENCE ICD: 638 Ohm
AL THRESHOLD: 1.2 V
HV IMPEDENCE: 42 Ohm
RV LEAD AMPLITUDE: 6.8 mv
RV LEAD IMPEDENCE ICD: 972 Ohm
RV LEAD THRESHOLD: 0.6 V
TZAT-0001FASTVT: 1
TZAT-0001FASTVT: 2
TZAT-0013FASTVT: 2
TZAT-0018FASTVT: NEGATIVE
TZON-0003FASTVT: 333.3 ms
TZST-0001FASTVT: 5
TZST-0001FASTVT: 6
TZST-0003FASTVT: 31 J
TZST-0003FASTVT: 41 J

## 2011-11-10 NOTE — Progress Notes (Signed)
ICD check 

## 2011-11-16 ENCOUNTER — Ambulatory Visit (INDEPENDENT_AMBULATORY_CARE_PROVIDER_SITE_OTHER): Payer: Medicare Other | Admitting: Physician Assistant

## 2011-11-16 ENCOUNTER — Encounter: Payer: Self-pay | Admitting: Physician Assistant

## 2011-11-16 VITALS — BP 130/70 | HR 73 | Ht 69.0 in | Wt 183.0 lb

## 2011-11-16 DIAGNOSIS — I5022 Chronic systolic (congestive) heart failure: Secondary | ICD-10-CM | POA: Diagnosis not present

## 2011-11-16 DIAGNOSIS — I2510 Atherosclerotic heart disease of native coronary artery without angina pectoris: Secondary | ICD-10-CM | POA: Diagnosis not present

## 2011-11-16 DIAGNOSIS — I10 Essential (primary) hypertension: Secondary | ICD-10-CM

## 2011-11-16 DIAGNOSIS — I2589 Other forms of chronic ischemic heart disease: Secondary | ICD-10-CM | POA: Diagnosis not present

## 2011-11-16 DIAGNOSIS — I255 Ischemic cardiomyopathy: Secondary | ICD-10-CM

## 2011-11-16 DIAGNOSIS — I4891 Unspecified atrial fibrillation: Secondary | ICD-10-CM

## 2011-11-16 DIAGNOSIS — E785 Hyperlipidemia, unspecified: Secondary | ICD-10-CM

## 2011-11-16 DIAGNOSIS — G459 Transient cerebral ischemic attack, unspecified: Secondary | ICD-10-CM

## 2011-11-16 NOTE — Progress Notes (Signed)
[address] [place], [place]  [address] Phone: [phone] Fax:  [phone]  Date:  11/16/2011   Name:  Jacob [name]   DOB:  1931-09-23   MRN:  [id number]  PCP:  [name], MD, MD  Primary Cardiologist:  Dr. Charlton Haws  Primary Electrophysiologist:  Dr. Lewayne Bunting    History of Present Illness: Jacob [name] is a 78 y.o. male who returns for post hospital follow up.  He has a history of Ischemic cardiomyopathy, systolic CHF, EF previously 25-30%, coronary artery disease with a chronically occluded RCA, status post AICD, HTN, HL, CKD, AFib.  Last cardiac catheterization 10/2010: Mid LAD 20-30%, distal LAD 40%, mid D1 40%, mid circumflex 20-30%, distal RCA occluded with left to right collaterals.  Last seen by Dr. Eden Emms 08/2011.  He was admitted 5/26-5/27 with a TIA.  He had right-sided facial drooping.  In the emergency room, his symptoms had resolved and he was not given tPA.  He did not have an MRI due to his history of ICD.  Echocardiogram 11/08/11: Normal LV wall thickness, EF 45-50%.  Carotid Dopplers were negative for ICA stenosis.  TCD notable for diffuse intracranial atherosclerosis.  It was noted that the patient has a history of atrial fibrillation.  He is apparently not a Coumadin candidate.  I reviewed his telemetry strips.  It appears that he remained in sinus rhythm throughout his hospitalization.  I reviewed his records.  He has a history of AFib noted on interrogation of his device and was placed on Pradaxa in 04/2010.  I believe Pradaxa was stopped in the setting of hematuria.  He has subsequently undergone TURP.  In the hospital, it was noted he was only taking ASA intermittently.  Thus, ASA was increased to 325 mg QD as it was thought he was not a candidate for coumadin or any novel anticoagulant.  The patient tells me that he is still taking Pradaxa.  He states that he was taking this when he presented to the hospital with his TIA  symptoms.  He denies any bleeding problems.  He denies any chest discomfort.  He denies any syncope or near syncope.  He denies any further facial droop or unilateral weakness or difficulty with speech.  He does not see neurology for several weeks. He does note dyspnea with more extreme activities.  This overall seems to be fairly stable.  He denies orthopnea, PND or significant pedal edema.  Wt Readings from Last 3 Encounters:  11/16/11 183 lb (83.008 kg)  11/07/11 184 lb (83.462 kg)  08/25/11 190 lb (86.183 kg)     Potassium  Date/Time Value Range Status  11/07/2011  2:16 PM 4.1  3.5-5.1 (mEq/L) Final     Creatinine, Ser  Date/Time Value Range Status  11/07/2011  7:09 PM 1.21  0.50-1.35 (mg/dL) Final     ALT  Date/Time Value Range Status  11/07/2011  2:16 PM 7  0-53 (U/L) Final     TSH  Date/Time Value Range Status  02/10/2011 10:01 AM 0.99  0.35-5.50 (uIU/mL) Final     Hemoglobin  Date/Time Value Range Status  11/07/2011  7:09 PM 10.5* 13.0-17.0 (g/dL) Final    Past Medical History  Diagnosis Date  . Chronic systolic heart failure     EF 18-84% in past;  Echocardiogram 11/08/11: Normal LV wall thickness, EF 45-50%.  Marland Kitchen HTN (hypertension)   . History of diverticulitis of colon   . GERD (gastroesophageal reflux disease)   .  History of nephrolithiasis   . Ischemic cardiomyopathy     echo 2010 LVEF 25%; s/p ICD 12/2006  . Urinary retention     foley since july 2012, foley last changed 1 week ago  . Arthritis   . HLD (hyperlipidemia)   . Barrett's esophagus   . Coronary artery disease     cardiac catheterization 10/2010: Mid LAD 20-30%, distal LAD 40%, mid D1 40%, mid circumflex 20-30%, distal RCA occluded with left to right collaterals  . Benign prostatic hypertrophy   . Skin cancer   . Atrial fibrillation   . TIA (transient ischemic attack) 10/2011    carotids neg for ICA stenosis    Current Outpatient Prescriptions  Medication Sig Dispense Refill  . aspirin 325 MG  tablet Take 1 tablet (325 mg total) by mouth daily.  90 tablet  3  . carvedilol (COREG) 6.25 MG tablet Take 6.25 mg by mouth 2 (two) times daily with a meal.      . cyanocobalamin 100 MCG tablet Take 100 mcg by mouth daily.       Marland Kitchen dutasteride (AVODART) 0.5 MG capsule Take 0.5 mg by mouth every morning.       . fluticasone (FLONASE) 50 MCG/ACT nasal spray Place 2 sprays into the nose daily as needed for allergies.  16 g  6  . HYDROcodone-acetaminophen (NORCO) 5-325 MG per tablet Take 1 tablet by mouth every 6 (six) hours as needed. Pain       . isosorbide mononitrate (IMDUR) 30 MG 24 hr tablet Take 30 mg by mouth daily.      Marland Kitchen loratadine (CLARITIN) 10 MG tablet Take 1 tablet (10 mg total) by mouth daily as needed for allergies.  30 tablet  2  . NITROSTAT 0.4 MG SL tablet Place 0.4 mg under the tongue every 5 (five) minutes as needed. For chest pain      . omeprazole (PRILOSEC) 20 MG capsule Take 1 capsule (20 mg total) by mouth daily.  30 capsule  5  . PRADAXA 150 MG CAPS Take 1 tablet by mouth Twice daily.      . simvastatin (ZOCOR) 40 MG tablet 40 mg every morning.       . solifenacin (VESICARE) 5 MG tablet Take 10 mg by mouth daily.      . Tamsulosin HCl (FLOMAX) 0.4 MG CAPS Take 0.4 mg by mouth daily.      Marland Kitchen DISCONTD: carvedilol (COREG) 6.25 MG tablet Take 2 tablets (12.5 mg total) by mouth 2 (two) times daily with a meal.  60 tablet  5    Allergies: Allergies  Allergen Reactions  . Penicillins Itching, Swelling and Rash    History  Substance Use Topics  . Smoking status: Former Smoker -- 2.0 packs/day for 30 years    Quit date: 06/14/1968  . Smokeless tobacco: Never Used  . Alcohol Use: No     ROS:  Please see the history of present illness.   He has occasional indigestion.  All other systems reviewed and negative.   PHYSICAL EXAM: VS:  BP 130/70  Pulse 73  Ht 5\' 9"  (1.753 m)  Wt 183 lb (83.008 kg)  BMI 27.02 kg/m2  SpO2 97% Well nourished, well developed, in no acute  distress HEENT: normal Neck: no JVD Cardiac:  normal S1, S2; RRR; no murmur Lungs:  clear to auscultation bilaterally, no wheezing, rhonchi or rales Abd: soft, nontender, no hepatomegaly Ext: no edema Skin: warm and dry Neuro:  CNs 2-12 intact,  no focal abnormalities noted  EKG:  A paced, heart rate 62, arm lead reversal   ASSESSMENT AND PLAN:  1.  Patient Fibrillation He has a CHADS2 score of 5. He is currently in sinus rhythm. He would benefit from anticoagulation therapy. The patient tells me that he is on Pradaxa.  I asked him several times about this.  He states that he has been taking this for the last several months.  He was taking it when he presented to the hospital with TIA. He does admit to taking aspirin only intermittently. At this point, I would not change his aspirin back to 81 mg a day until he sees Dr. Pearlean Brownie. I will have him followup with a basic metabolic panel and CBC in a couple of weeks. Followup with Dr. Eden Emms in 6 weeks.  2.  Coronary Artery Disease Stable.  No angina. Continue aspirin and statin.  3.  Chronic Systolic Congestive Heart Failure Secondary to Ischemic Cardiomyopathy EF improved on recent echo. Continue carvedilol.   I am unaware as to why he is not taking an ACE.   Volume is stable.  4.  Hypertension Controlled.  Continue current therapy.  5.  Hyperlipidemia Continue Zocor  6.Transient Ischemic Attack Continue followup with neurology. As noted above, continue aspirin 325 mg daily along with Pradaxa until seen back by neurology. Consider changing aspirin to 81 mg if neurology agrees.  7.  Status post AICD Device was interrogated last week by the EP clinic   Signed, Tereso Newcomer, PA-C  11:54 AM 11/16/2011

## 2011-11-16 NOTE — Patient Instructions (Signed)
Your physician recommends that you schedule a follow-up appointment in: 4-6 weeks with Dr Eden Emms  Your physician recommends that you return for lab work in: 2 weeks (bmet, cbc)  Continue on your current medications at the same dose.  Continue Coreg 6.25mg  twice daily

## 2011-11-24 ENCOUNTER — Encounter: Payer: Self-pay | Admitting: Internal Medicine

## 2011-11-24 ENCOUNTER — Ambulatory Visit (INDEPENDENT_AMBULATORY_CARE_PROVIDER_SITE_OTHER): Payer: Medicare Other | Admitting: Internal Medicine

## 2011-11-24 VITALS — BP 122/70 | HR 65 | Temp 98.2°F | Ht 67.0 in | Wt 184.0 lb

## 2011-11-24 DIAGNOSIS — G459 Transient cerebral ischemic attack, unspecified: Secondary | ICD-10-CM

## 2011-11-24 DIAGNOSIS — N401 Enlarged prostate with lower urinary tract symptoms: Secondary | ICD-10-CM

## 2011-11-24 DIAGNOSIS — I4891 Unspecified atrial fibrillation: Secondary | ICD-10-CM

## 2011-11-24 NOTE — Patient Instructions (Addendum)
It was good to see you today. We have reviewed your prior records including labs and tests today Medications reviewed and updated, no prescription changes recommended at this time. Continue Pradaxa AND full dose aspirin daily until your visit with stroke doctor Pearlean Brownie) for follow up  Please reschedule followup for 3-4 months (not next week); call sooner if problems.

## 2011-11-24 NOTE — Assessment & Plan Note (Signed)
Indwelling foley due to BOO since 01/2011 -replaced 03/2011- out 09/2011 after TURP Continue meds as per uro

## 2011-11-24 NOTE — Progress Notes (Signed)
  Subjective:    Patient ID: Jacob [name], male    DOB: 1932/03/24, 78 y.o.   MRN: [id number]  HPI  Here for hospital follow up - TIA event  Past Medical History  Diagnosis Date  . Chronic systolic heart failure     EF 82-95% in past;  Echocardiogram 11/08/11: Normal LV wall thickness, EF 45-50%.  Marland Kitchen HTN (hypertension)   . History of diverticulitis of colon   . GERD (gastroesophageal reflux disease)   . History of nephrolithiasis   . Ischemic cardiomyopathy     echo 2010 LVEF 25%; s/p ICD 12/2006  . Urinary retention     foley since july 2012, foley last changed 1 week ago  . Arthritis   . HLD (hyperlipidemia)   . Barrett's esophagus   . Coronary artery disease     cardiac catheterization 10/2010: Mid LAD 20-30%, distal LAD 40%, mid D1 40%, mid circumflex 20-30%, distal RCA occluded with left to right collaterals  . Benign prostatic hypertrophy   . Skin cancer   . Atrial fibrillation   . TIA (transient ischemic attack) 10/2011    carotids neg for ICA stenosis    Review of Systems  Constitutional: Positive for fatigue. Negative for fever and chills.  Respiratory: Negative for cough and shortness of breath.   Cardiovascular: Negative for chest pain.  Genitourinary: Negative for flank pain and testicular pain.       Objective:   Physical Exam  BP 122/70  Pulse 65  Temp 98.2 F (36.8 C) (Oral)  Ht 5\' 7"  (1.702 m)  Wt 184 lb (83.462 kg)  BMI 28.82 kg/m2  SpO2 98%. Wt Readings from Last 3 Encounters:  11/24/11 184 lb (83.462 kg)  11/16/11 183 lb (83.008 kg)  11/07/11 184 lb (83.462 kg)   Constitutional:  He appears well-developed and well-nourished. No distress. Neck: Normal range of motion. Neck supple. No JVD or bruits present.  Cardiovascular: Normal rate, regular rhythm and normal heart sounds.  No murmur heard. no BLE edema Pulmonary/Chest: Effort normal and breath sounds normal. No respiratory distress. no wheezes. Neurological: he is alert and oriented to  person, place, and time. No cranial nerve deficit. Coordination normal. speech hesitant but fluent and appropriate content Psychiatric: he has a normal mood and affect. behavior is normal. Judgment and thought content normal.    Lab Results  Component Value Date   WBC 7.4 11/07/2011   HGB 10.5* 11/07/2011   HCT 34.3* 11/07/2011   PLT 159 11/07/2011   GLUCOSE 74 11/07/2011   CHOL 119 11/08/2011   TRIG 127 11/08/2011   HDL 29* 11/08/2011   LDLDIRECT 54.3 08/26/2010   LDLCALC 65 11/08/2011   ALT 7 11/07/2011   AST 12 11/07/2011   NA 141 11/07/2011   K 4.1 11/07/2011   CL 108 11/07/2011   CREATININE 1.21 11/07/2011   BUN 17 11/07/2011   CO2 27 11/07/2011   TSH 0.99 02/10/2011   INR 1.19 12/30/2010   HGBA1C 5.9* 11/07/2011       Assessment & Plan:  See problem list. Medications and labs reviewed today.

## 2011-11-24 NOTE — Assessment & Plan Note (Signed)
Transient event - 12-h 10/2011 - resolved R facial droop and RUE weakness prior to ER presentation>no TIA No MRI/MRA due to AICD S/p neuro eval - neg carotids Careful hx reveals he appears to be on anticoag (pradaxa) prior to and during event despite concern in hospital that pt was not on anticoag despite AF (cards not 11/16/11 suggests same hx) Continue pradaxa and ASA 325 qd until follow up with stroke (dr Pearlean Brownie) and consider decrease ASA to 81 hosp x/tests and meds reviewed in depth with pt today, 25 min with >505 time counseling pt on same

## 2011-11-24 NOTE — Assessment & Plan Note (Signed)
Pt never stopped Pradaxa as recommended by cards 03/2011 - Continue same given TIA 10/2011 and no hematuria since fall 2012

## 2011-11-30 ENCOUNTER — Ambulatory Visit (INDEPENDENT_AMBULATORY_CARE_PROVIDER_SITE_OTHER): Payer: Medicare Other | Admitting: *Deleted

## 2011-11-30 ENCOUNTER — Other Ambulatory Visit: Payer: Medicare Other

## 2011-11-30 DIAGNOSIS — I509 Heart failure, unspecified: Secondary | ICD-10-CM

## 2011-11-30 DIAGNOSIS — E7800 Pure hypercholesterolemia, unspecified: Secondary | ICD-10-CM | POA: Diagnosis not present

## 2011-11-30 DIAGNOSIS — I4891 Unspecified atrial fibrillation: Secondary | ICD-10-CM

## 2011-11-30 DIAGNOSIS — I2510 Atherosclerotic heart disease of native coronary artery without angina pectoris: Secondary | ICD-10-CM

## 2011-11-30 DIAGNOSIS — I10 Essential (primary) hypertension: Secondary | ICD-10-CM

## 2011-11-30 LAB — CBC WITH DIFFERENTIAL/PLATELET
Basophils Absolute: 0 10*3/uL (ref 0.0–0.1)
Basophils Relative: 0.6 % (ref 0.0–3.0)
HCT: 33.7 % — ABNORMAL LOW (ref 39.0–52.0)
Hemoglobin: 10.8 g/dL — ABNORMAL LOW (ref 13.0–17.0)
Lymphocytes Relative: 21.3 % (ref 12.0–46.0)
Lymphs Abs: 1.6 10*3/uL (ref 0.7–4.0)
MCHC: 31.9 g/dL (ref 30.0–36.0)
Monocytes Relative: 7 % (ref 3.0–12.0)
Neutro Abs: 5.2 10*3/uL (ref 1.4–7.7)
RBC: 4.2 Mil/uL — ABNORMAL LOW (ref 4.22–5.81)
RDW: 19.4 % — ABNORMAL HIGH (ref 11.5–14.6)

## 2011-11-30 LAB — BASIC METABOLIC PANEL
BUN: 12 mg/dL (ref 6–23)
CO2: 31 mEq/L (ref 19–32)
Calcium: 8.5 mg/dL (ref 8.4–10.5)
Chloride: 110 mEq/L (ref 96–112)
Creatinine, Ser: 1.1 mg/dL (ref 0.4–1.5)

## 2011-11-30 NOTE — Progress Notes (Signed)
Quick Note:  Please report to patient. The recent labs are stable. Continue same medication and careful diet. ______ 

## 2011-12-02 ENCOUNTER — Encounter: Payer: Self-pay | Admitting: *Deleted

## 2011-12-02 ENCOUNTER — Encounter (HOSPITAL_COMMUNITY): Payer: Self-pay

## 2011-12-02 ENCOUNTER — Ambulatory Visit: Payer: Medicare Other | Admitting: Internal Medicine

## 2011-12-02 ENCOUNTER — Emergency Department (HOSPITAL_COMMUNITY): Payer: Medicare Other

## 2011-12-02 ENCOUNTER — Observation Stay (HOSPITAL_COMMUNITY)
Admission: EM | Admit: 2011-12-02 | Discharge: 2011-12-04 | Disposition: A | Discharge status: Home / Self Care | Payer: Medicare Other | Attending: Internal Medicine | Admitting: Internal Medicine

## 2011-12-02 DIAGNOSIS — R071 Chest pain on breathing: Secondary | ICD-10-CM | POA: Diagnosis not present

## 2011-12-02 DIAGNOSIS — R079 Chest pain, unspecified: Principal | ICD-10-CM | POA: Insufficient documentation

## 2011-12-02 DIAGNOSIS — I2589 Other forms of chronic ischemic heart disease: Secondary | ICD-10-CM | POA: Diagnosis not present

## 2011-12-02 DIAGNOSIS — R339 Retention of urine, unspecified: Secondary | ICD-10-CM | POA: Diagnosis present

## 2011-12-02 DIAGNOSIS — N138 Other obstructive and reflux uropathy: Secondary | ICD-10-CM | POA: Insufficient documentation

## 2011-12-02 DIAGNOSIS — I10 Essential (primary) hypertension: Secondary | ICD-10-CM

## 2011-12-02 DIAGNOSIS — I255 Ischemic cardiomyopathy: Secondary | ICD-10-CM

## 2011-12-02 DIAGNOSIS — Z79899 Other long term (current) drug therapy: Secondary | ICD-10-CM | POA: Diagnosis not present

## 2011-12-02 DIAGNOSIS — I4891 Unspecified atrial fibrillation: Secondary | ICD-10-CM | POA: Diagnosis not present

## 2011-12-02 DIAGNOSIS — R05 Cough: Secondary | ICD-10-CM | POA: Diagnosis not present

## 2011-12-02 DIAGNOSIS — R0789 Other chest pain: Secondary | ICD-10-CM | POA: Diagnosis not present

## 2011-12-02 DIAGNOSIS — K219 Gastro-esophageal reflux disease without esophagitis: Secondary | ICD-10-CM | POA: Insufficient documentation

## 2011-12-02 DIAGNOSIS — N401 Enlarged prostate with lower urinary tract symptoms: Secondary | ICD-10-CM | POA: Diagnosis not present

## 2011-12-02 DIAGNOSIS — E782 Mixed hyperlipidemia: Secondary | ICD-10-CM | POA: Diagnosis not present

## 2011-12-02 DIAGNOSIS — Z7901 Long term (current) use of anticoagulants: Secondary | ICD-10-CM | POA: Insufficient documentation

## 2011-12-02 DIAGNOSIS — Z8673 Personal history of transient ischemic attack (TIA), and cerebral infarction without residual deficits: Secondary | ICD-10-CM | POA: Diagnosis not present

## 2011-12-02 DIAGNOSIS — I509 Heart failure, unspecified: Secondary | ICD-10-CM | POA: Diagnosis not present

## 2011-12-02 DIAGNOSIS — E7800 Pure hypercholesterolemia, unspecified: Secondary | ICD-10-CM | POA: Insufficient documentation

## 2011-12-02 DIAGNOSIS — I5022 Chronic systolic (congestive) heart failure: Secondary | ICD-10-CM | POA: Diagnosis not present

## 2011-12-02 DIAGNOSIS — I2510 Atherosclerotic heart disease of native coronary artery without angina pectoris: Secondary | ICD-10-CM | POA: Insufficient documentation

## 2011-12-02 DIAGNOSIS — G459 Transient cerebral ischemic attack, unspecified: Secondary | ICD-10-CM | POA: Diagnosis present

## 2011-12-02 HISTORY — DX: Disorder of kidney and ureter, unspecified: N28.9

## 2011-12-02 LAB — COMPREHENSIVE METABOLIC PANEL
ALT: 10 U/L (ref 0–53)
AST: 12 U/L (ref 0–37)
Alkaline Phosphatase: 68 U/L (ref 39–117)
CO2: 27 mEq/L (ref 19–32)
Chloride: 107 mEq/L (ref 96–112)
GFR calc Af Amer: 60 mL/min — ABNORMAL LOW (ref >90)
GFR calc non Af Amer: 52 mL/min — ABNORMAL LOW (ref >90)
Glucose, Bld: 86 mg/dL (ref 70–99)
Potassium: 4.8 mEq/L (ref 3.5–5.1)
Sodium: 142 mEq/L (ref 135–145)

## 2011-12-02 LAB — URINALYSIS, ROUTINE W REFLEX MICROSCOPIC
Hgb urine dipstick: NEGATIVE
Ketones, ur: NEGATIVE mg/dL
Nitrite: NEGATIVE
Urobilinogen, UA: 0.2 mg/dL (ref 0.0–1.0)

## 2011-12-02 LAB — URINE MICROSCOPIC-ADD ON

## 2011-12-02 LAB — CBC
MCV: 80.8 fL (ref 78.0–100.0)
Platelets: 148 10*3/uL — ABNORMAL LOW (ref 150–400)
RBC: 4.02 MIL/uL — ABNORMAL LOW (ref 4.22–5.81)
WBC: 7.7 10*3/uL (ref 4.0–10.5)

## 2011-12-02 LAB — POCT I-STAT TROPONIN I: Troponin i, poc: 0 ng/mL (ref 0.00–0.08)

## 2011-12-02 LAB — DIFFERENTIAL
Lymphocytes Relative: 28 % (ref 12–46)
Lymphs Abs: 2.1 10*3/uL (ref 0.7–4.0)
Neutro Abs: 4.7 10*3/uL (ref 1.7–7.7)
Neutrophils Relative %: 61 % (ref 43–77)

## 2011-12-02 LAB — APTT: aPTT: 68 seconds — ABNORMAL HIGH (ref 24–37)

## 2011-12-02 LAB — D-DIMER, QUANTITATIVE: D-Dimer, Quant: 0.99 ug/mL-FEU — ABNORMAL HIGH (ref 0.00–0.48)

## 2011-12-02 MED ORDER — SODIUM CHLORIDE 0.9 % IV SOLN
INTRAVENOUS | Status: DC
  Administered 2011-12-02: 22:00:00 via INTRAVENOUS

## 2011-12-02 NOTE — ED Notes (Signed)
2L O2 applied via [place].

## 2011-12-02 NOTE — ED Notes (Signed)
Old and new EKG given to MD, copies placed in chart. 

## 2011-12-02 NOTE — ED Notes (Signed)
Per ems- pt c/o intermittent left sided cp radiating into center x3-4 days. Pt took 325asa and 5 nitro, currently denies any pain. Pt denies n/v, sob, diaphoresis. Pt does become SOB and has inc of cp with exertion.

## 2011-12-02 NOTE — ED Provider Notes (Cosign Needed)
History     CSN: [id number]  Arrival date & time 12/02/11  2027   First MD Initiated Contact with Patient 12/02/11 2037      Chief Complaint  Patient presents with  . Chest Pain    (Consider location/radiation/quality/duration/timing/severity/associated sxs/prior treatment) HPI Comments: Pt is a 78 year old man who complains of chest pain.  The pain is in the left chest and goes into the right chest.  He has also had a cough that accentuates his chest pain.  In addition, he feels that his food will hang up in his chest.  Today he took 6 sublingual nitroglycerins; when that did not help, he decided to come to the ED.  He has a history of ischemic cardiomyopathy with EF of about 25%.  He has an AICD.  He has had a total knee operation, complicated by hematuria from a foley catheter.  He has also had esophageal stricture and has had esophageal dilation twice in the past.  He was recently hospitalized with a TIA in April 2013.   Patient is a 78 y.o. male presenting with chest pain.  Chest Pain The chest pain began 3 - 5 days ago. Duration of episode(s) is 15 minutes. Chest pain occurs intermittently. The chest pain is unchanged. The pain is associated with coughing. The severity of the pain is moderate. The quality of the pain is described as pleuritic. Radiates to: Radiates from left anterior chest to right anterior chest. Exacerbated by: Nothing. Primary symptoms include shortness of breath and cough. Pertinent negatives for primary symptoms include no fever, no syncope, no nausea and no vomiting.  Pertinent negatives for associated symptoms include no diaphoresis and no lower extremity edema. He tried nitroglycerin for the symptoms. Risk factors include male gender (Known ischemic cardiomyopathy.).  His past medical history is significant for CAD and CHF.     Past Medical History  Diagnosis Date  . Chronic systolic heart failure     EF 82-95% in past;  Echocardiogram 11/08/11: Normal LV  wall thickness, EF 45-50%.  Marland Kitchen HTN (hypertension)   . History of diverticulitis of colon   . GERD (gastroesophageal reflux disease)   . History of nephrolithiasis   . Ischemic cardiomyopathy     echo 2010 LVEF 25%; s/p ICD 12/2006  . Urinary retention     foley since july 2012, foley last changed 1 week ago  . Arthritis   . HLD (hyperlipidemia)   . Barrett's esophagus   . Coronary artery disease     cardiac catheterization 10/2010: Mid LAD 20-30%, distal LAD 40%, mid D1 40%, mid circumflex 20-30%, distal RCA occluded with left to right collaterals  . Benign prostatic hypertrophy   . Skin cancer   . Atrial fibrillation   . TIA (transient ischemic attack) 10/2011    carotids neg for ICA stenosis    Past Surgical History  Procedure Date  . Cardiac defibrillator placement 12/2006    AICD  . Total knee arthroplasty 01/04/11    L TKA  . Cardiac catheterization 11-05-10  . Total knee arthroplasty     left  January 04, 2011  . Arthroscopic knee surgery 5-6 yrs ago    both knees  . Joint replacement   . Cataracts removed   . Transurethral resection of prostate 06/14/2011    Procedure: TRANSURETHRAL RESECTION OF THE PROSTATE (TURP);  Surgeon: Valetta Fuller, MD;  Location: [facility];  Service: Urology;  Laterality: N/A;    Family History  Problem  Relation Age of Onset  . Arthritis    . Hypertension    . Heart disease      History  Substance Use Topics  . Smoking status: Former Smoker -- 2.0 packs/day for 30 years    Quit date: 06/14/1968  . Smokeless tobacco: Never Used  . Alcohol Use: No      Review of Systems  Constitutional: Negative for fever and diaphoresis.  HENT: Negative.   Eyes: Negative.   Respiratory: Positive for cough and shortness of breath.   Cardiovascular: Positive for chest pain. Negative for syncope.  Gastrointestinal: Negative for nausea and vomiting.       Feels like food hangs up in his chest.  Genitourinary:       Recent foley catheter.    Musculoskeletal: Negative.   Skin: Negative.   Neurological: Negative.        Recent TIA.  Psychiatric/Behavioral: Negative.     Allergies  Penicillins  Home Medications   Current Outpatient Rx  Name Route Sig Dispense Refill  . ASPIRIN 325 MG PO TABS Oral Take 1 tablet (325 mg total) by mouth daily. 90 tablet 3  . CARVEDILOL 12.5 MG PO TABS Oral Take 1 tablet (12.5 mg total) by mouth 2 (two) times daily with a meal.    . CYANOCOBALAMIN 100 MCG PO TABS Oral Take 100 mcg by mouth daily.     . DUTASTERIDE 0.5 MG PO CAPS Oral Take 0.5 mg by mouth every morning.     Marland Kitchen FLUTICASONE PROPIONATE 50 MCG/ACT NA SUSP Nasal Place 2 sprays into the nose daily as needed for allergies. 16 g 6  . HYDROCODONE-ACETAMINOPHEN 5-325 MG PO TABS Oral Take 1 tablet by mouth every 6 (six) hours as needed. Pain     . ISOSORBIDE MONONITRATE ER 30 MG PO TB24 Oral Take 30 mg by mouth daily.    Marland Kitchen LORATADINE 10 MG PO TABS Oral Take 1 tablet (10 mg total) by mouth daily as needed for allergies. 30 tablet 2  . NITROSTAT 0.4 MG SL SUBL Sublingual Place 0.4 mg under the tongue every 5 (five) minutes as needed. For chest pain    . OMEPRAZOLE 20 MG PO CPDR Oral Take 1 capsule (20 mg total) by mouth daily. 30 capsule 5  . PRADAXA 150 MG PO CAPS Oral Take 1 tablet by mouth Twice daily.    Marland Kitchen SIMVASTATIN 40 MG PO TABS  40 mg every morning.     Marland Kitchen SOLIFENACIN SUCCINATE 5 MG PO TABS Oral Take 10 mg by mouth daily.      BP 131/82  Pulse 68  Temp 97.9 F (36.6 C) (Oral)  Resp 18  SpO2 100%  Physical Exam  Nursing note and vitals reviewed. Constitutional: He is oriented to person, place, and time. He appears well-developed and well-nourished. No distress.  HENT:  Head: Normocephalic and atraumatic.  Right Ear: External ear normal.  Left Ear: External ear normal.  Mouth/Throat: Oropharynx is clear and moist.  Eyes: Conjunctivae and EOM are normal. Pupils are equal, round, and reactive to light.  Neck: Normal range of  motion. Neck supple. No JVD present.  Cardiovascular: Normal rate, regular rhythm and normal heart sounds.   Pulmonary/Chest: Effort normal and breath sounds normal.  Abdominal: Soft. Bowel sounds are normal.  Musculoskeletal: Normal range of motion. He exhibits no edema and no tenderness.  Neurological: He is alert and oriented to person, place, and time.       No sensory or motor deficit.  Skin: Skin is warm and dry.  Psychiatric: He has a normal mood and affect. His behavior is normal.    ED Course  Procedures (including critical care time)  8:50 PM  Date: 12/02/2011  Rate: 64  Rhythm: normal sinus rhythm  QRS Axis: normal  Intervals: normal  ST/T Wave abnormalities: normal  Conduction Disutrbances:nonspecific intraventricular conduction delay  Narrative Interpretation: Abnormal EKG  Old EKG Reviewed: unchanged  9:33 PM Pt was seen and had physical exam performed.  Lab workup was ordered.  EKG was benign.  Old charts were reviewed.   12:06 AM Results for orders placed during the hospital encounter of 12/02/11  CBC      Component Value Range   WBC 7.7  4.0 - 10.5 K/uL   RBC 4.02 (*) 4.22 - 5.81 MIL/uL   Hemoglobin 10.0 (*) 13.0 - 17.0 g/dL   HCT 95.6 (*) 21.3 - 08.6 %   MCV 80.8  78.0 - 100.0 fL   MCH 24.9 (*) 26.0 - 34.0 pg   MCHC 30.8  30.0 - 36.0 g/dL   RDW 57.8 (*) 46.9 - 62.9 %   Platelets 148 (*) 150 - 400 K/uL  DIFFERENTIAL      Component Value Range   Neutrophils Relative 61  43 - 77 %   Neutro Abs 4.7  1.7 - 7.7 K/uL   Lymphocytes Relative 28  12 - 46 %   Lymphs Abs 2.1  0.7 - 4.0 K/uL   Monocytes Relative 8  3 - 12 %   Monocytes Absolute 0.6  0.1 - 1.0 K/uL   Eosinophils Relative 3  0 - 5 %   Eosinophils Absolute 0.2  0.0 - 0.7 K/uL   Basophils Relative 1  0 - 1 %   Basophils Absolute 0.0  0.0 - 0.1 K/uL  COMPREHENSIVE METABOLIC PANEL      Component Value Range   Sodium 142  135 - 145 mEq/L   Potassium 4.8  3.5 - 5.1 mEq/L   Chloride 107  96 - 112  mEq/L   CO2 27  19 - 32 mEq/L   Glucose, Bld 86  70 - 99 mg/dL   BUN 16  6 - 23 mg/dL   Creatinine, Ser 5.28  0.50 - 1.35 mg/dL   Calcium 8.6  8.4 - 41.3 mg/dL   Total Protein 6.3  6.0 - 8.3 g/dL   Albumin 3.3 (*) 3.5 - 5.2 g/dL   AST 12  0 - 37 U/L   ALT 10  0 - 53 U/L   Alkaline Phosphatase 68  39 - 117 U/L   Total Bilirubin 0.3  0.3 - 1.2 mg/dL   GFR calc non Af Amer 52 (*) >90 mL/min   GFR calc Af Amer 60 (*) >90 mL/min  URINALYSIS, ROUTINE W REFLEX MICROSCOPIC      Component Value Range   Color, Urine AMBER (*) YELLOW   APPearance CLEAR  CLEAR   Specific Gravity, Urine 1.030  1.005 - 1.030   pH 5.5  5.0 - 8.0   Glucose, UA NEGATIVE  NEGATIVE mg/dL   Hgb urine dipstick NEGATIVE  NEGATIVE   Bilirubin Urine SMALL (*) NEGATIVE   Ketones, ur NEGATIVE  NEGATIVE mg/dL   Protein, ur 30 (*) NEGATIVE mg/dL   Urobilinogen, UA 0.2  0.0 - 1.0 mg/dL   Nitrite NEGATIVE  NEGATIVE   Leukocytes, UA NEGATIVE  NEGATIVE  PROTIME-INR      Component Value Range   Prothrombin Time 19.1 (*)  11.6 - 15.2 seconds   INR 1.57 (*) 0.00 - 1.49  APTT      Component Value Range   aPTT 68 (*) 24 - 37 seconds  PRO B NATRIURETIC PEPTIDE      Component Value Range   Pro B Natriuretic peptide (BNP) 760.9 (*) 0 - 450 pg/mL  D-DIMER, QUANTITATIVE      Component Value Range   D-Dimer, Quant 0.99 (*) 0.00 - 0.48 ug/mL-FEU  POCT I-STAT TROPONIN I      Component Value Range   Troponin i, poc 0.00  0.00 - 0.08 ng/mL   Comment 3           URINE MICROSCOPIC-ADD ON      Component Value Range   Squamous Epithelial / LPF RARE  RARE   WBC, UA 0-2  <3 WBC/hpf   RBC / HPF 0-2  <3 RBC/hpf   Dg Chest 2 View  12/02/2011  *RADIOLOGY REPORT*  Clinical Data: Cough and pleuritic chest pain.  History of ischemic cardiomyopathy.  CHEST - 2 VIEW  Comparison: Chest radiograph performed 03/02/2011  Findings: The lungs are well-aerated.  Pulmonary vascularity is at the upper limits of normal.  There is no evidence of focal  opacification, pleural effusion or pneumothorax.  The heart is normal in size; a pacemaker/AICD is noted at the left chest wall, with leads ending at the right atrium and right ventricle.  No acute osseous abnormalities are seen.  IMPRESSION: No acute cardiopulmonary process seen.  Original Report Authenticated By: Tonia Ghent, M.D.   12:07 AM Review of lab tests shows normal chemistries, BNP mildly elevated at 760.9, CBC showing anemia with Hb 10 and Hct 32.5, D-dimer elevated at 0.99, INR subtherapeutic at 1.57, UA negative, Chest x-ray negative.  I advised pt that he should be admitted for chest pain observation.    12:56 AM Case discussed with Dr. Mikeal Hawthorne.  Admit to [facility], Team 4, to a telemetry bed, to Dr. Ramiro Harvest.  1. Chest pain   2. Ischemic cardiomyopathy         Carleene Cooper III, MD 12/03/11 [phone]

## 2011-12-02 NOTE — ED Notes (Signed)
Pt states that he was eating dinner a couple days ago and began having dull CP. Pt states that over past few days pain will come and go. Pt states "It could feel more like a pressure." Pt denies any other concerning sx. Pt denies any pain currently.

## 2011-12-03 ENCOUNTER — Encounter (HOSPITAL_COMMUNITY): Payer: Self-pay | Admitting: Internal Medicine

## 2011-12-03 ENCOUNTER — Observation Stay (HOSPITAL_COMMUNITY): Payer: Medicare Other

## 2011-12-03 DIAGNOSIS — E782 Mixed hyperlipidemia: Secondary | ICD-10-CM | POA: Diagnosis not present

## 2011-12-03 DIAGNOSIS — I4891 Unspecified atrial fibrillation: Secondary | ICD-10-CM

## 2011-12-03 DIAGNOSIS — K219 Gastro-esophageal reflux disease without esophagitis: Secondary | ICD-10-CM

## 2011-12-03 DIAGNOSIS — R079 Chest pain, unspecified: Principal | ICD-10-CM

## 2011-12-03 DIAGNOSIS — I10 Essential (primary) hypertension: Secondary | ICD-10-CM

## 2011-12-03 LAB — CARDIAC PANEL(CRET KIN+CKTOT+MB+TROPI)
Relative Index: INVALID (ref 0.0–2.5)
Relative Index: INVALID (ref 0.0–2.5)
Total CK: 47 U/L (ref 7–232)
Total CK: 48 U/L (ref 7–232)
Troponin I: <0.3 ng/mL (ref <0.30)
Troponin I: <0.3 ng/mL (ref <0.30)
Troponin I: <0.3 ng/mL (ref <0.30)

## 2011-12-03 LAB — PRO B NATRIURETIC PEPTIDE: Pro B Natriuretic peptide (BNP): 620.6 pg/mL — ABNORMAL HIGH (ref 0–450)

## 2011-12-03 LAB — COMPREHENSIVE METABOLIC PANEL
Albumin: 3.3 g/dL — ABNORMAL LOW (ref 3.5–5.2)
BUN: 15 mg/dL (ref 6–23)
Creatinine, Ser: 1.21 mg/dL (ref 0.50–1.35)
Total Protein: 6.3 g/dL (ref 6.0–8.3)

## 2011-12-03 LAB — HEMOGLOBIN A1C: Hgb A1c MFr Bld: 6 % — ABNORMAL HIGH (ref <5.7)

## 2011-12-03 MED ORDER — ASPIRIN 325 MG PO TABS
325.0000 mg | ORAL_TABLET | Freq: Every day | ORAL | Status: DC
  Administered 2011-12-03: 325 mg via ORAL
  Filled 2011-12-03: qty 1

## 2011-12-03 MED ORDER — NITROGLYCERIN 0.4 MG SL SUBL: 0.4000 mg | SUBLINGUAL_TABLET | SUBLINGUAL | Status: DC | PRN

## 2011-12-03 MED ORDER — MORPHINE SULFATE 2 MG/ML IJ SOLN: 1.0000 mg | INTRAMUSCULAR | Status: DC | PRN

## 2011-12-03 MED ORDER — ENOXAPARIN SODIUM 40 MG/0.4ML ~~LOC~~ SOLN: 40.0000 mg | SUBCUTANEOUS | Status: DC

## 2011-12-03 MED ORDER — SODIUM CHLORIDE 0.45 % IV SOLN
INTRAVENOUS | Status: DC
  Administered 2011-12-03: 05:00:00 via INTRAVENOUS

## 2011-12-03 MED ORDER — LISINOPRIL 2.5 MG PO TABS
2.5000 mg | ORAL_TABLET | Freq: Every day | ORAL | Status: DC
  Administered 2011-12-03 – 2011-12-04 (×2): 2.5 mg via ORAL
  Filled 2011-12-03 (×2): qty 1

## 2011-12-03 MED ORDER — CARVEDILOL 12.5 MG PO TABS
12.5000 mg | ORAL_TABLET | Freq: Two times a day (BID) | ORAL | Status: DC
  Administered 2011-12-03: 12.5 mg via ORAL
  Filled 2011-12-03 (×3): qty 1

## 2011-12-03 MED ORDER — TECHNETIUM TO 99M ALBUMIN AGGREGATED
3.0000 | Freq: Once | INTRAVENOUS | Status: AC | PRN
  Administered 2011-12-03: 3 via INTRAVENOUS

## 2011-12-03 MED ORDER — BIOTENE DRY MOUTH MT LIQD: 15.0000 mL | OROMUCOSAL | Status: DC | PRN

## 2011-12-03 MED ORDER — PANTOPRAZOLE SODIUM 40 MG PO TBEC
40.0000 mg | DELAYED_RELEASE_TABLET | Freq: Every day | ORAL | Status: DC
  Administered 2011-12-03: 40 mg via ORAL
  Filled 2011-12-03: qty 1

## 2011-12-03 MED ORDER — VITAMIN B-12 100 MCG PO TABS
100.0000 ug | ORAL_TABLET | Freq: Every day | ORAL | Status: DC
  Administered 2011-12-03 – 2011-12-04 (×2): 100 ug via ORAL
  Filled 2011-12-03 (×2): qty 1

## 2011-12-03 MED ORDER — DARIFENACIN HYDROBROMIDE ER 7.5 MG PO TB24
7.5000 mg | ORAL_TABLET | Freq: Every day | ORAL | Status: DC
  Administered 2011-12-03 – 2011-12-04 (×2): 7.5 mg via ORAL
  Filled 2011-12-03 (×2): qty 1

## 2011-12-03 MED ORDER — BIOTENE DRY MOUTH MT LIQD
15.0000 mL | Freq: Two times a day (BID) | OROMUCOSAL | Status: DC
  Administered 2011-12-03 – 2011-12-04 (×3): 15 mL via OROMUCOSAL

## 2011-12-03 MED ORDER — DABIGATRAN ETEXILATE MESYLATE 150 MG PO CAPS
150.0000 mg | ORAL_CAPSULE | Freq: Two times a day (BID) | ORAL | Status: DC
  Administered 2011-12-03 – 2011-12-04 (×3): 150 mg via ORAL
  Filled 2011-12-03 (×4): qty 1

## 2011-12-03 MED ORDER — ONDANSETRON HCL 4 MG PO TABS: 4.0000 mg | ORAL_TABLET | Freq: Four times a day (QID) | ORAL | Status: DC | PRN

## 2011-12-03 MED ORDER — ACETAMINOPHEN 325 MG PO TABS
650.0000 mg | ORAL_TABLET | Freq: Four times a day (QID) | ORAL | Status: DC | PRN
Start: 2011-12-03 — End: 2011-12-04

## 2011-12-03 MED ORDER — ONDANSETRON HCL 4 MG/2ML IJ SOLN: 4.0000 mg | Freq: Four times a day (QID) | INTRAMUSCULAR | Status: DC | PRN

## 2011-12-03 MED ORDER — ISOSORBIDE MONONITRATE ER 30 MG PO TB24
30.0000 mg | ORAL_TABLET | Freq: Every day | ORAL | Status: DC
  Administered 2011-12-03 – 2011-12-04 (×2): 30 mg via ORAL
  Filled 2011-12-03 (×2): qty 1

## 2011-12-03 MED ORDER — LORATADINE 10 MG PO TABS: 10.0000 mg | ORAL_TABLET | Freq: Every day | ORAL | Status: DC | PRN

## 2011-12-03 MED ORDER — DUTASTERIDE 0.5 MG PO CAPS
0.5000 mg | ORAL_CAPSULE | Freq: Every day | ORAL | Status: DC
  Administered 2011-12-03 – 2011-12-04 (×2): 0.5 mg via ORAL
  Filled 2011-12-03 (×2): qty 1

## 2011-12-03 MED ORDER — ACETAMINOPHEN 650 MG RE SUPP: 650.0000 mg | Freq: Four times a day (QID) | RECTAL | Status: DC | PRN

## 2011-12-03 MED ORDER — SIMVASTATIN 40 MG PO TABS
40.0000 mg | ORAL_TABLET | Freq: Every day | ORAL | Status: DC
  Administered 2011-12-03: 40 mg via ORAL
  Filled 2011-12-03 (×2): qty 1

## 2011-12-03 MED ORDER — CARVEDILOL 6.25 MG PO TABS
18.7500 mg | ORAL_TABLET | Freq: Two times a day (BID) | ORAL | Status: DC
  Administered 2011-12-03 – 2011-12-04 (×2): 18.75 mg via ORAL
  Filled 2011-12-03 (×4): qty 1

## 2011-12-03 MED ORDER — TAMSULOSIN HCL 0.4 MG PO CAPS
0.4000 mg | ORAL_CAPSULE | Freq: Every day | ORAL | Status: DC
  Administered 2011-12-03 – 2011-12-04 (×2): 0.4 mg via ORAL
  Filled 2011-12-03 (×2): qty 1

## 2011-12-03 MED ORDER — ASPIRIN EC 81 MG PO TBEC: 81.0000 mg | DELAYED_RELEASE_TABLET | Freq: Every day | ORAL | Status: DC

## 2011-12-03 MED ORDER — HYDROCODONE-ACETAMINOPHEN 5-325 MG PO TABS: 1.0000 | ORAL_TABLET | Freq: Four times a day (QID) | ORAL | Status: DC | PRN

## 2011-12-03 MED ORDER — FLUTICASONE PROPIONATE 50 MCG/ACT NA SUSP: 2.0000 | Freq: Every day | NASAL | Status: DC | PRN

## 2011-12-03 NOTE — Care Management Note (Unsigned)
    Page 1 of 1   12/03/2011     1:55:18 PM   CARE MANAGEMENT NOTE 12/03/2011  Patient:  Jacob [name], Jacob [name]   Account Number:  [id number]  Date Initiated:  12/03/2011  Documentation initiated by:  SIMMONS,Lark Langenfeld  Subjective/Objective Assessment:   ADMITTED WITH CHEST PAIN; LIVES AT HOME WITH WIFE, MARY; USES [facility] FOR AMBULATION; USES [facility] ON [address] FOR RX.     Action/Plan:   DISCHARGE PLANNING INITIATED.   Anticipated DC Date:  12/04/2011   Anticipated DC Plan:  HOME/SELF CARE      DC Planning Services  CM consult      Choice offered to / List presented to:             Status of service:  In process, will continue to follow Medicare Important Message given?   (If response is "NO", the following Medicare IM given date fields will be blank) Date Medicare IM given:   Date Additional Medicare IM given:    Discharge Disposition:    Per UR Regulation:  Reviewed for med. necessity/level of care/duration of stay  If discussed at Long Length of Stay Meetings, dates discussed:    Comments:  12/03/11  1354  Jamieson Lisa SIMMONS RN, BSN [phone] NCM WILL FOLLOW.

## 2011-12-03 NOTE — Progress Notes (Signed)
Subjective: Patient denies any CP this morning. Patient denies any current shortness of breath. Patient denies any recent long trips. Patient states last knee surgery was approximately 9 months ago. Patient denies any orthopnea or paroxysmal nocturnal dyspnea.  Objective: Vital signs in last 24 hours: Filed Vitals:   12/03/11 0300 12/03/11 0344 12/03/11 0553 12/03/11 0842  BP: 153/70 168/81 152/74 163/75  Pulse: 60 61  64  Temp:  97 F (36.1 C)    TempSrc:  Oral    Resp: 13 18    Height:  5\' 5"  (1.651 m)    Weight:  84.46 kg (186 lb 3.2 oz)    SpO2: 99% 95%      Intake/Output Summary (Last 24 hours) at 12/03/11 0937 Last data filed at 12/03/11 0900  Gross per 24 hour  Intake    570 ml  Output   1100 ml  Net   -530 ml    Weight change:   General: Alert, awake, oriented x3, in no acute distress. HEENT: No bruits, no goiter. Heart: Irregularly irregular. Lungs: Clear to auscultation bilaterally. Abdomen: Soft, nontender, nondistended, positive bowel sounds. Extremities: No clubbing cyanosis or edema with positive pedal pulses.    Lab Results:  [facility] 12/03/11 0457 12/02/11 2124  NA 143 142  K 4.6 4.8  CL 109 107  CO2 28 27  GLUCOSE 85 86  BUN 15 16  CREATININE 1.21 1.28  CALCIUM 8.6 8.6  MG -- --  PHOS -- --    Basename 12/03/11 0457 12/02/11 2124  AST 12 12  ALT 9 10  ALKPHOS 66 68  BILITOT 0.4 0.3  PROT 6.3 6.3  ALBUMIN 3.3* 3.3*   No results found for this basename: LIPASE:2,AMYLASE:2 in the last 72 hours  Basename 12/02/11 2124 11/30/11 1139  WBC 7.7 7.5  NEUTROABS 4.7 5.2  HGB 10.0* 10.8*  HCT 32.5* 33.7*  MCV 80.8 80.2  PLT 148* 153.0    Basename 12/03/11 0457  CKTOTAL 47  CKMB 1.9  CKMBINDEX --  TROPONINI <0.30   No components found with this basename: POCBNP:3  Basename 12/02/11 2124  DDIMER 0.99*   No results found for this basename: HGBA1C:2 in the last 72 hours No results found for this basename:  CHOL:2,HDL:2,LDLCALC:2,TRIG:2,CHOLHDL:2,LDLDIRECT:2 in the last 72 hours No results found for this basename: TSH,T4TOTAL,FREET3,T3FREE,THYROIDAB in the last 72 hours No results found for this basename: VITAMINB12:2,FOLATE:2,FERRITIN:2,TIBC:2,IRON:2,RETICCTPCT:2 in the last 72 hours  Micro Results: No results found for this or any previous visit (from the past 240 hour(s)).  Studies/Results: Dg Chest 2 View  12/02/2011  *RADIOLOGY REPORT*  Clinical Data: Cough and pleuritic chest pain.  History of ischemic cardiomyopathy.  CHEST - 2 VIEW  Comparison: Chest radiograph performed 03/02/2011  Findings: The lungs are well-aerated.  Pulmonary vascularity is at the upper limits of normal.  There is no evidence of focal opacification, pleural effusion or pneumothorax.  The heart is normal in size; a pacemaker/AICD is noted at the left chest wall, with leads ending at the right atrium and right ventricle.  No acute osseous abnormalities are seen.  IMPRESSION: No acute cardiopulmonary process seen.  Original Report Authenticated By: Tonia Ghent, M.D.    Medications:     . antiseptic oral rinse  15 mL Mouth Rinse BID  . aspirin  325 mg Oral Daily  . carvedilol  12.5 mg Oral BID WC  . dabigatran  150 mg Oral Q12H  . darifenacin  7.5 mg Oral Daily  . dutasteride  0.5  mg Oral Daily  . isosorbide mononitrate  30 mg Oral Daily  . pantoprazole  40 mg Oral Q1200  . simvastatin  40 mg Oral q1800  . Tamsulosin HCl  0.4 mg Oral Daily  . cyanocobalamin  100 mcg Oral Daily  . DISCONTD: aspirin EC  81 mg Oral Daily  . DISCONTD: enoxaparin  40 mg Subcutaneous Q24H    Assessment: Principal Problem:  *Chest pain Active Problems:  HYPERCHOLESTEROLEMIA, MIXED  HYPERTENSION  CORONARY ATHEROSCLEROSIS, NATIVE VESSEL  Atrial fibrillation  GERD  BENIGN PROSTATIC HYPERTROPHY, WITH OBSTRUCTION  Urinary retention  TIA (transient ischemic attack)   Plan: #1. Chest pain Questionable etiology. Patient with a  significant cardiac history and concern for angina. On admission d-dimer that was obtained was elevated at 0.99. Patient denies any long car trips. Patient states last surgery was approximately 9 months ago. Due to elevated d-dimer willl check a stat CT angiogram of the chest to rule out PE. Cardiac enzymes negative x1, serial enzymes are pending. EKG shows atrial pacing. Patient with recent 2-D echo done 11/08/2011 shows an EF of 45-50%. With mildly reduced systolic function. Cavity size was normal. Status post ICD implantation. Will saline lock IV fluids. Will cancel repeat 2-D echo for now. Continue oxygen, aspirin, Coreg, pradaxa, Imdur, Zocor. We'll consult with cardiology for further evaluation and recommendations.  #2 atrial fibrillation Currently rate controlled on Coreg. Continue Pradaxa.  #3 hyperlipidemia Patient with a fasting lipid panel done 11/08/2011 shows LDL at goal of 65. Continue home dose of Zocor.  #4 hypertension Continue current regimen of Coreg, Imdur, Avodart, Flomax.  #5 GERD PPI  #6 coronary artery disease/ Ischemic Cardiomyopathy/ ICD See problem #1. Continue aspirin, Coreg, Pradaxa, and, Zocor. Cardiology consultation is pending.  #7 BPH/urinary retention Continue Flomax and Avodart, enablex.  #8 history of recent TIA (5/ 2013) Continue Pradaxa.  #9 prophylaxis PPI for GI prophylaxis. Patient on pradaxa.     LOS: 1 day   Campbell Agramonte [phone] 12/03/2011, 9:37 AM

## 2011-12-03 NOTE — H&P (Signed)
Jacob [name] is an 78 y.o. male.   Chief Complaint: Chest pain HPI: A 78 year old man with known history of ischemic cardiomyopathy and coronary artery disease his last EF was 30-45%. Patient came in secondary to transient centrally located chest pain radiating to his left chin. He took up to 5 nitroglycerin as did not have much 122 to the emergency room. He he received nitroglycerin in the emergency room on morphine his chest pain is now gone. Initial cardiac enzymes are negative and his EKGs is unchanged from previous. Due to patient's known history of ischemic cardiomyopathy his being admitted for further workup. His pain and his high was normal as 6/10 no diaphoresis no nausea and vomiting. Past Medical History  Diagnosis Date  . Chronic systolic heart failure     EF 16-10% in past;  Echocardiogram 11/08/11: Normal LV wall thickness, EF 45-50%.  Marland Kitchen HTN (hypertension)   . History of diverticulitis of colon   . GERD (gastroesophageal reflux disease)   . History of nephrolithiasis   . Ischemic cardiomyopathy     echo 2010 LVEF 25%; s/p ICD 12/2006  . Urinary retention     foley since july 2012, foley last changed 1 week ago  . Arthritis   . HLD (hyperlipidemia)   . Barrett's esophagus   . Coronary artery disease     cardiac catheterization 10/2010: Mid LAD 20-30%, distal LAD 40%, mid D1 40%, mid circumflex 20-30%, distal RCA occluded with left to right collaterals  . Benign prostatic hypertrophy   . TIA (transient ischemic attack) 10/2011    carotids neg for ICA stenosis  . Atrial fibrillation   . Skin cancer     Past Surgical History  Procedure Date  . Cardiac defibrillator placement 12/2006    AICD  . Total knee arthroplasty 01/04/11    L TKA  . Cardiac catheterization 11-05-10  . Total knee arthroplasty     left  January 04, 2011  . Arthroscopic knee surgery 5-6 yrs ago    both knees  . Joint replacement   . Cataracts removed   . Transurethral resection of prostate 06/14/2011   Procedure: TRANSURETHRAL RESECTION OF THE PROSTATE (TURP);  Surgeon: Valetta Fuller, MD;  Location: [facility];  Service: Urology;  Laterality: N/A;    Family History  Problem Relation Age of Onset  . Arthritis    . Hypertension    . Heart disease     Social History:  reports that he quit smoking about 43 years ago. He has never used smokeless tobacco. He reports that he does not drink alcohol or use illicit drugs.  Allergies:  Allergies  Allergen Reactions  . Penicillins Itching, Swelling and Rash    Medications Prior to Admission  Medication Sig Dispense Refill  . aspirin 325 MG tablet Take 1 tablet (325 mg total) by mouth daily.  90 tablet  3  . carvedilol (COREG) 12.5 MG tablet Take 1 tablet (12.5 mg total) by mouth 2 (two) times daily with a meal.      . cyanocobalamin 100 MCG tablet Take 100 mcg by mouth daily.       Marland Kitchen dutasteride (AVODART) 0.5 MG capsule Take 0.5 mg by mouth every morning.       . fluticasone (FLONASE) 50 MCG/ACT nasal spray Place 2 sprays into the nose daily as needed for allergies.  16 g  6  . HYDROcodone-acetaminophen (NORCO) 5-325 MG per tablet Take 1 tablet by mouth every 6 (six) hours as  needed. Pain       . isosorbide mononitrate (IMDUR) 30 MG 24 hr tablet Take 30 mg by mouth daily.      Marland Kitchen loratadine (CLARITIN) 10 MG tablet Take 1 tablet (10 mg total) by mouth daily as needed for allergies.  30 tablet  2  . NITROSTAT 0.4 MG SL tablet Place 0.4 mg under the tongue every 5 (five) minutes as needed. For chest pain      . omeprazole (PRILOSEC) 20 MG capsule Take 1 capsule (20 mg total) by mouth daily.  30 capsule  5  . PRADAXA 150 MG CAPS Take 1 tablet by mouth Twice daily.      . simvastatin (ZOCOR) 40 MG tablet 40 mg every morning.       . solifenacin (VESICARE) 5 MG tablet Take 10 mg by mouth daily.      . Tamsulosin HCl (FLOMAX) 0.4 MG CAPS Take 0.4 mg by mouth daily.        Results for orders placed during the hospital encounter of 12/02/11 (from the  past 48 hour(s))  PRO B NATRIURETIC PEPTIDE     Status: Abnormal   Collection Time   12/02/11  9:23 PM      Component Value Range Comment   Pro B Natriuretic peptide (BNP) 760.9 (*) 0 - 450 pg/mL   CBC     Status: Abnormal   Collection Time   12/02/11  9:24 PM      Component Value Range Comment   WBC 7.7  4.0 - 10.5 K/uL    RBC 4.02 (*) 4.22 - 5.81 MIL/uL    Hemoglobin 10.0 (*) 13.0 - 17.0 g/dL    HCT 16.1 (*) 09.6 - 52.0 %    MCV 80.8  78.0 - 100.0 fL    MCH 24.9 (*) 26.0 - 34.0 pg    MCHC 30.8  30.0 - 36.0 g/dL    RDW 04.5 (*) 40.9 - 15.5 %    Platelets 148 (*) 150 - 400 K/uL   DIFFERENTIAL     Status: Normal   Collection Time   12/02/11  9:24 PM      Component Value Range Comment   Neutrophils Relative 61  43 - 77 %    Neutro Abs 4.7  1.7 - 7.7 K/uL    Lymphocytes Relative 28  12 - 46 %    Lymphs Abs 2.1  0.7 - 4.0 K/uL    Monocytes Relative 8  3 - 12 %    Monocytes Absolute 0.6  0.1 - 1.0 K/uL    Eosinophils Relative 3  0 - 5 %    Eosinophils Absolute 0.2  0.0 - 0.7 K/uL    Basophils Relative 1  0 - 1 %    Basophils Absolute 0.0  0.0 - 0.1 K/uL   COMPREHENSIVE METABOLIC PANEL     Status: Abnormal   Collection Time   12/02/11  9:24 PM      Component Value Range Comment   Sodium 142  135 - 145 mEq/L    Potassium 4.8  3.5 - 5.1 mEq/L    Chloride 107  96 - 112 mEq/L    CO2 27  19 - 32 mEq/L    Glucose, Bld 86  70 - 99 mg/dL    BUN 16  6 - 23 mg/dL    Creatinine, Ser 8.11  0.50 - 1.35 mg/dL    Calcium 8.6  8.4 - 91.4 mg/dL    Total Protein 6.3  6.0 - 8.3 g/dL    Albumin 3.3 (*) 3.5 - 5.2 g/dL    AST 12  0 - 37 U/L    ALT 10  0 - 53 U/L    Alkaline Phosphatase 68  39 - 117 U/L    Total Bilirubin 0.3  0.3 - 1.2 mg/dL    GFR calc non Af Amer 52 (*) >90 mL/min    GFR calc Af Amer 60 (*) >90 mL/min   PROTIME-INR     Status: Abnormal   Collection Time   12/02/11  9:24 PM      Component Value Range Comment   Prothrombin Time 19.1 (*) 11.6 - 15.2 seconds    INR 1.57 (*)  0.00 - 1.49   APTT     Status: Abnormal   Collection Time   12/02/11  9:24 PM      Component Value Range Comment   aPTT 68 (*) 24 - 37 seconds   D-DIMER, QUANTITATIVE     Status: Abnormal   Collection Time   12/02/11  9:24 PM      Component Value Range Comment   D-Dimer, Quant 0.99 (*) 0.00 - 0.48 ug/mL-FEU   URINALYSIS, ROUTINE W REFLEX MICROSCOPIC     Status: Abnormal   Collection Time   12/02/11  9:31 PM      Component Value Range Comment   Color, Urine AMBER (*) YELLOW BIOCHEMICALS MAY BE AFFECTED BY COLOR   APPearance CLEAR  CLEAR    Specific Gravity, Urine 1.030  1.005 - 1.030    pH 5.5  5.0 - 8.0    Glucose, UA NEGATIVE  NEGATIVE mg/dL    Hgb urine dipstick NEGATIVE  NEGATIVE    Bilirubin Urine SMALL (*) NEGATIVE    Ketones, ur NEGATIVE  NEGATIVE mg/dL    Protein, ur 30 (*) NEGATIVE mg/dL    Urobilinogen, UA 0.2  0.0 - 1.0 mg/dL    Nitrite NEGATIVE  NEGATIVE    Leukocytes, UA NEGATIVE  NEGATIVE   URINE MICROSCOPIC-ADD ON     Status: Normal   Collection Time   12/02/11  9:31 PM      Component Value Range Comment   Squamous Epithelial / LPF RARE  RARE    WBC, UA 0-2  <3 WBC/hpf    RBC / HPF 0-2  <3 RBC/hpf   POCT I-STAT TROPONIN I     Status: Normal   Collection Time   12/02/11  9:57 PM      Component Value Range Comment   Troponin i, poc 0.00  0.00 - 0.08 ng/mL    Comment 3             Dg Chest 2 View  12/02/2011  *RADIOLOGY REPORT*  Clinical Data: Cough and pleuritic chest pain.  History of ischemic cardiomyopathy.  CHEST - 2 VIEW  Comparison: Chest radiograph performed 03/02/2011  Findings: The lungs are well-aerated.  Pulmonary vascularity is at the upper limits of normal.  There is no evidence of focal opacification, pleural effusion or pneumothorax.  The heart is normal in size; a pacemaker/AICD is noted at the left chest wall, with leads ending at the right atrium and right ventricle.  No acute osseous abnormalities are seen.  IMPRESSION: No acute cardiopulmonary  process seen.  Original Report Authenticated By: Tonia Ghent, M.D.    Review of Systems  Respiratory: Positive for shortness of breath. Negative for wheezing.   Cardiovascular: Positive for chest pain and leg swelling. Negative for palpitations,  orthopnea, claudication and PND.  All other systems reviewed and are negative.    Blood pressure 168/81, pulse 61, temperature 97 F (36.1 C), temperature source Oral, resp. rate 18, height 5\' 5"  (1.651 m), weight 84.46 kg (186 lb 3.2 oz), SpO2 95.00%. Physical Exam  Constitutional: He is oriented to person, place, and time. He appears well-developed and well-nourished.  HENT:  Head: Normocephalic and atraumatic.  Right Ear: External ear normal.  Left Ear: External ear normal.  Nose: Nose normal.  Mouth/Throat: Oropharynx is clear and moist.  Eyes: Conjunctivae and EOM are normal. Pupils are equal, round, and reactive to light.  Neck: Normal range of motion. Neck supple.  Cardiovascular: Normal rate, regular rhythm and normal heart sounds.   Respiratory: Effort normal and breath sounds normal.  GI: Soft. Bowel sounds are normal.  Musculoskeletal: Normal range of motion.  Neurological: He is alert and oriented to person, place, and time. He has normal reflexes.  Skin: Skin is warm and dry.  Psychiatric: He has a normal mood and affect. His behavior is normal. Judgment and thought content normal.     Assessment/Plan 78 year old gentleman with history of coronary artery disease here with chest pain. More than likely with angina. Nose and cath. Patient has known atrial fibrillation the rate is normal. Plan #1 angina: Patient will be admitted for rule out MI. Will check serial cardiac enzymes and 3. [place] cardiology will be consulted in the morning for further evaluation and recommendations. #2 hyperlipidemia: We'll continue with his statin. #3 atrial fibrillation: Rate is controlled continue his home therapy #4 GERD: Continue his PPI in  the hospital. #5 hypertension: Continue his home medications also.  Braya Habermehl,LAWAL 12/03/2011, 5:18 AM

## 2011-12-03 NOTE — Consult Note (Signed)
CARDIOLOGY CONSULT NOTE  [name] ID: Jacob [name], MRN: [id number], DOB/AGE: Oct 16, 1931 78 y.o. Admit date: 12/02/2011   Date of Consult: 12/03/2011 Primary Physician: [name], MD Primary Cardiologist: Dr. Riley Kill  Chief Complaint: chest pain   HPI: Jacob [name] is a 78 y/o M with somewhat of a complex history, especially recently. He has known ICM/CHF (EF previously 25-30%, most recent 45-50%), CAD with chronically occ RCA, atrial fibrillation, CKD. He was admitted 5/26-5/27 with a TIA with R sided facial drooping. He had neg carotid dopplers, EF 45-50%; no MRI due to ICD.  At time of his TIA it was noted he was only taking ASA intermittently so was placed on 325mg  daily. However, there is some question about ongoing Pradaxa use despite instruction otherwise. He has a hx of AFib noted on interrogation of his device and was placed on Pradaxa in 04/2010. He was admitted 12/2010 following TKR with ABL anemia, hematuria, ARF, BPH and transfused and Pradaxa was continued at that time. OV with Dr. Eden Emms 03/2011 says he was not a coumadin candidate given hematuria while on Pradaxa - Jacob [name] was instructed to stop his Pradaxa shortly after that visit. Jacob [name] was admitted for TURP 06/2011 and was instructed to stop Pradaxa per that discharge summary. He was seen in our office by Tereso Newcomer PA-C on 11/16/11 at which time Jacob [name] said he was still taking Pradaxa but was felt to be doing stable on it. This was continued in light of recent TIA. He was instructed to talk to neurologist about decreasing to 81mg  of ASA, but Jacob [name] is not sure when his appointment (or if he has one) with Dr. Pearlean Brownie.  Jacob [name] presented to [facility] with complaints of chest pain. He reports it as L-sided radiating across to Jacob middle of his chest, fairly constant for Jacob last week except that it spontaneously resolved when EMS arrived yesterday and he has been pain free since. It would get transiently  worse at times, particularly when he would eat food and felt like it would get caught. This goes away on its own. However, he has also noticed at times it is worse with exertion. He thinks it might be resolved with rest, but is not sure. He has had no associated SOB, diaphoresis, nausea, vomiting, abdominal pain, palpitations, weight gain, orthopnea or syncope. He denies ICD discharge. He has been taking all of his medicines per his report.  He is not tachycardic, tachypnic or hypoxic. CE's are neg at POC x 1 and full set x 1. pBNP is only 620. Hgb 10.0 (3 weeks ago was near this at 10.5). CXR showed no acute CP dz. LFTs WNL. Last cardiac catheterization showed stable anatomy in 10/2010: Mid LAD 20-30%, distal LAD 40%, mid D1 40%, mid circumflex 20-30%, distal RCA occluded with left to right collaterals.  ICD was just interrogated 3 weeks ago demonstrating normal device function, no ventricular arrhythmias.  Past Medical History  Diagnosis Date  . Chronic systolic heart failure     EF 40-98% in past;  Echocardiogram 11/08/11: Normal LV wall thickness, EF 45-50%.  Marland Kitchen HTN (hypertension)   . History of diverticulitis of colon   . GERD (gastroesophageal reflux disease)   . History of nephrolithiasis   . Ischemic cardiomyopathy     s/p ICD 2008 (see CHF for EF)  . Urinary retention     foley since july 2012  . Arthritis   . HLD (hyperlipidemia)   . Barrett's  esophagus   . Coronary artery disease     a. Chronically occ RCA. b. Last cardiac catheterization 10/2010: Mid LAD 20-30%, distal LAD 40%, mid D1 40%, mid circumflex 20-30%, distal RCA occluded with left to right collaterals.  . Benign prostatic hypertrophy   . TIA (transient ischemic attack) 10/2011    carotids neg for ICA stenosis  . Atrial fibrillation   . Skin cancer   . BPH (benign prostatic hyperplasia)   . Renal insufficiency     Renal failure in July 2012 secondary to hypotension, ARB use, and       Most Recent Cardiac  Studies: 10/2011 2D Echo Study Conclusions Left ventricle: Jacob cavity size was normal. Wall thickness was normal. Systolic function was mildly reduced. Jacob estimated ejection fraction was in Jacob range of 45% to 50%.  ICD interrogation 11/10/11 ICD check in clinic. Normal device function. Thresholds and sensing consistent with previous device measurements. Impedance trends stable over time. No evidence of any ventricular arrhythmias. 4 mode switches, 0%, - coumadin. Histogram distribution  appropriate for [name] and level of activity. No changes made this session. Device programmed at appropriate safety margins. Device programmed to optimize intrinsic conduction. [name] education completed including shock plan. Alert tones/vibration  demonstrated for [name]. ROV 3 months with Jacob device clinic.  10/2010 Cath CORONARY ARTERIOGRAPHY: Left main coronary artery was normal.  Jacob left anterior descending artery was a large vessel.  There was calcification of Jacob proximal portion, but no significant  stenosis. Jacob midvessel had 20-30% disease at Jacob distal vessels, had a  40% eccentric lesion.  Jacob first diagonal branch was large with 40% midvessel disease.  Jacob circumflex coronary artery was nondominant. There is a very large  branching obtuse marginal branch with a 20-30% midvessel disease, CAD  groove branch was normal.  There were left-to-right collaterals to Jacob distal RCA.  Jacob right coronary artery was 100% occluded distally, which was known  from previous cath in 2010 and is chronic.  Jacob [name] has known decrease in LV function with an EF of 25-30% by  echo because his creatinine was 1.7. We elected only to cross Jacob valve  and did not shoot an LV gram.  IMPRESSION: Jacob [name] had stable hemodynamics with low filling  pressures. His coronary anatomy is stable with a collateralized total  right coronary artery, but no significant disease in his left-sided  vessels. He should be  fairly  low risk in regard to an ischemic event during knee surgery. His  filling pressures were low, but there still should be judicious use of  fluids post knee replacement. He is cleared to have knee surgery with  Dr. Durene Romans.   Surgical History:  Past Surgical History  Procedure Date  . Cardiac defibrillator placement 12/2006    AICD  . Total knee arthroplasty 01/04/11    L TKA  . Cardiac catheterization 11-05-10  . Total knee arthroplasty     left  January 04, 2011  . Arthroscopic knee surgery 5-6 yrs ago    both knees  . Joint replacement   . Cataracts removed   . Transurethral resection of prostate 06/14/2011    Procedure: TRANSURETHRAL RESECTION OF Jacob PROSTATE (TURP);  Surgeon: Valetta Fuller, MD;  Location: [facility];  Service: Urology;  Laterality: N/A;     Home Meds: Prior to Admission medications   Medication Sig Start Date End Date Taking? Authorizing Provider  aspirin 325 MG tablet Take 1 tablet (325 mg total)  by mouth daily. 11/08/11 11/07/12 Yes Ripudeep Jenna Luo, MD  carvedilol (COREG) 12.5 MG tablet Take 1 tablet (12.5 mg total) by mouth 2 (two) times daily with a meal. 11/24/11  Yes Newt Lukes, MD  cyanocobalamin 100 MCG tablet Take 100 mcg by mouth daily.    Yes Historical Provider, MD  dutasteride (AVODART) 0.5 MG capsule Take 0.5 mg by mouth every morning.    Yes Historical Provider, MD  fluticasone (FLONASE) 50 MCG/ACT nasal spray Place 2 sprays into Jacob nose daily as needed for allergies. 05/26/11  Yes Newt Lukes, MD  HYDROcodone-acetaminophen (NORCO) 5-325 MG per tablet Take 1 tablet by mouth every 6 (six) hours as needed. Pain    Yes Historical Provider, MD  isosorbide mononitrate (IMDUR) 30 MG 24 hr tablet Take 30 mg by mouth daily.   Yes Historical Provider, MD  loratadine (CLARITIN) 10 MG tablet Take 1 tablet (10 mg total) by mouth daily as needed for allergies. 08/25/11 08/24/12 Yes Newt Lukes, MD  NITROSTAT 0.4 MG SL tablet Place 0.4 mg under  Jacob tongue every 5 (five) minutes as needed. For chest pain 08/13/11  Yes Historical Provider, MD  omeprazole (PRILOSEC) 20 MG capsule Take 1 capsule (20 mg total) by mouth daily. 06/10/11  Yes Newt Lukes, MD  PRADAXA 150 MG CAPS Take 1 tablet by mouth Twice daily. 11/15/11  Yes Historical Provider, MD  simvastatin (ZOCOR) 40 MG tablet 40 mg every morning.  03/05/11  Yes Wendall Stade, MD  solifenacin (VESICARE) 5 MG tablet Take 10 mg by mouth daily.   Yes Historical Provider, MD  Tamsulosin HCl (FLOMAX) 0.4 MG CAPS Take 0.4 mg by mouth daily.   Yes Historical Provider, MD    Inpatient Medications:    . antiseptic oral rinse  15 mL Mouth Rinse BID  . aspirin  325 mg Oral Daily  . carvedilol  12.5 mg Oral BID WC  . dabigatran  150 mg Oral Q12H  . darifenacin  7.5 mg Oral Daily  . dutasteride  0.5 mg Oral Daily  . isosorbide mononitrate  30 mg Oral Daily  . pantoprazole  40 mg Oral Q1200  . simvastatin  40 mg Oral q1800  . Tamsulosin HCl  0.4 mg Oral Daily  . cyanocobalamin  100 mcg Oral Daily  . DISCONTD: aspirin EC  81 mg Oral Daily  . DISCONTD: enoxaparin  40 mg Subcutaneous Q24H    Allergies:  Allergies  Allergen Reactions  . Penicillins Itching, Swelling and Rash    History   Social History  . Marital Status: Married    Spouse Name: N/A    Number of Children: N/A  . Years of Education: N/A   Occupational History  . retired    Social History Main Topics  . Smoking status: Former Smoker -- 2.0 packs/day for 30 years    Quit date: 06/14/1968  . Smokeless tobacco: Never Used  . Alcohol Use: No  . Drug Use: No  . Sexually Active: Not on file   Other Topics Concern  . Not on file   Social History Narrative  . No narrative on file     Family History  Problem Relation Age of Onset  . Arthritis    . Hypertension    . Heart disease      Review of Systems General: negative for chills, fever, night sweats or weight changes.  Cardiovascular: see  above Dermatological: negative for rash Respiratory: negative for cough or wheezing  Urologic: negative for hematuria Abdominal: negative for nausea, vomiting, diarrhea, bright red blood per rectum, melena, or hematemesis Neurologic: negative for visual changes, syncope, or dizziness All other systems reviewed and are otherwise negative except as noted above.  Labs:  [facility] 12/03/11 0457  CKTOTAL 47  CKMB 1.9  TROPONINI <0.30   Lab Results  Component Value Date   WBC 7.7 12/02/2011   HGB 10.0* 12/02/2011   HCT 32.5* 12/02/2011   MCV 80.8 12/02/2011   PLT 148* 12/02/2011    Lab 12/03/11 0457  NA 143  K 4.6  CL 109  CO2 28  BUN 15  CREATININE 1.21  CALCIUM 8.6  PROT 6.3  BILITOT 0.4  ALKPHOS 66  ALT 9  AST 12  GLUCOSE 85   Lab Results  Component Value Date   CHOL 119 11/08/2011   HDL 29* 11/08/2011   LDLCALC 65 11/08/2011   TRIG 127 11/08/2011   Lab Results  Component Value Date   DDIMER 0.99* 12/02/2011    Radiology/Studies:  1. Chest 2 View 12/02/2011  *RADIOLOGY REPORT*  Clinical Data: Cough and pleuritic chest pain.  History of ischemic cardiomyopathy.  CHEST - 2 VIEW  Comparison: Chest radiograph performed 03/02/2011  Findings: Jacob lungs are well-aerated.  Pulmonary vascularity is at Jacob upper limits of normal.  There is no evidence of focal opacification, pleural effusion or pneumothorax.  Jacob heart is normal in size; a pacemaker/AICD is noted at Jacob left chest wall, with leads ending at Jacob right atrium and right ventricle.  No acute osseous abnormalities are seen.  IMPRESSION: No acute cardiopulmonary process seen.  Original Report Authenticated By: Tonia Ghent, M.D.   EKG: 64bpm atrial pacing, NSIVD noa cute changes and no change from 11/16/11 except III no longer has TWI. Tele thus far shows NSR, intermittent A pacing, occ PACs  Physical Exam: Blood pressure 163/75, pulse 64, temperature 97 F (36.1 C), temperature source Oral, resp. rate 18, height 5\' 5"  (1.651  m), weight 186 lb 3.2 oz (84.46 kg), SpO2 95.00%. General: Well developed WM in no acute distress. Head: Normocephalic, atraumatic, sclera non-icteric, no xanthomas, nares are without discharge.  Neck: Negative for carotid bruits. JVD not elevated. Lungs: Clear bilaterally to auscultation without wheezes, rales, or rhonchi. Breathing is unlabored. Heart: RRR with S1 S2. No murmurs, rubs, or gallops appreciated. Abdomen: Soft, non-tender, non-distended with normoactive bowel sounds. No hepatomegaly. No rebound/guarding. No obvious abdominal masses. Msk:  Strength and tone appear normal for age. Skin: Multiple skin lesions of varying color/thickness, including head and neck. Extremities: No clubbing or cyanosis. No edema.  Distal pedal pulses are 2+ and equal bilaterally. Neuro: Alert and oriented X 3, but is not a very specific historian. Moves all extremities spontaneously. Psych:  Responds to questions appropriately with a normal affect.   Assessment and Plan:   1. Chest pain with somewhat atypical features and no objective evidence thus far - pain is mostly atypical. Ddx expands to include esophageal etiology given sensation of food getting "stuck" which exacerbates Jacob discomfort. However, there is a vaguely exertional component. Will discuss further w/u with MD. 2. CAD with chronically occluded RCA, stable dz by cath 10/2010. Continue ASA (consider decrease as below), BB, statin, Imdur. 3. ICM/chronic systolic CHF - stable without exacerbation, recently improved EF. Continue BB. Not on ACEI/ARB presumably due to hx of ARF. 4. HTN - will discuss med titration with MD given elevated BP while inpatient. 5. Atrial fib with recent TIA - continue Pradaxa.  Consider decreasing ASA to 81mg  daily. 6. H/o skin CA - he reports dx last year. He has multiple lesions that may warrant further investigation (forehead, neck) - would advise derm referral.  Signed, Dayna Dunn PA-C 12/03/2011, 11:15  AM  [name] seen with PA, agree with above note.  1. Chest pain: Pain seems to flare when he eats and food gets stuck going down.  However, he also has chest pain at other times as well.  Cardiac enzymes have been negative. I think his pain is most likely noncardiac.  However, he does have a significant CAD history.  I think it would be reasonable to have him do a myoview as an outpatient. I think PE is unlikely here.  He does have elevated D dimer but this is nonspecific.  No current chest pain, dyspnea, or hypoxia.  He has CKD.  I would not give him a contrast load by doing a CT angiogram.  Would be ok to do a V/Q scan but suspect this will be low-yield.  My highest suspicion is for GI etiology, ? Stricture.  Should get GI eval as outpatient.  2. Atrial fibrillation: Was supposed to stop Pradaxa for hematuria but never did.  He has had no recent hematuria.  OK to continue Pradaxa at current dose (GFR > 30).  Would, however, stop ASA 81.  I suspect [name] has stable CAD and ASA in addition to Pradaxa will increase bleeding risk without appreciable benefit. TIA was likely atrial fibrillation related so needs Pradaxa but not necessarily ASA.  3. Chronic systolic CHF: Last echo with EF back to 45-50%.  BP running high.  Increase Coreg to 18.75 mg bid.  Can start low dose lisinopril 2.5 mg daily.   Marca Ancona 12/03/2011 2:04 PM

## 2011-12-03 NOTE — Progress Notes (Signed)
Nutrition Brief Note  RD pulled to patient due to Nutrition Risk Report: problems chewing or swallowing foods and/or liquids per admission nutrition screen. Patient states he will occasionally have some difficulties; had his esophagus stretched several times in the past. Reports a good appetite; PO intake 100% on a Heart Healthy diet. BMI = 31.1 kg/m2 (Obesity Class I). No nutrition intervention indicated at this time. Please consult RD as needed.  Alger Memos, RD, LDN Pager #: [phone]

## 2011-12-04 DIAGNOSIS — E782 Mixed hyperlipidemia: Secondary | ICD-10-CM

## 2011-12-04 DIAGNOSIS — K219 Gastro-esophageal reflux disease without esophagitis: Secondary | ICD-10-CM

## 2011-12-04 DIAGNOSIS — I10 Essential (primary) hypertension: Secondary | ICD-10-CM | POA: Diagnosis not present

## 2011-12-04 DIAGNOSIS — R079 Chest pain, unspecified: Secondary | ICD-10-CM | POA: Diagnosis not present

## 2011-12-04 LAB — BASIC METABOLIC PANEL
BUN: 14 mg/dL (ref 6–23)
Calcium: 8.9 mg/dL (ref 8.4–10.5)
Creatinine, Ser: 1.16 mg/dL (ref 0.50–1.35)
GFR calc Af Amer: 68 mL/min — ABNORMAL LOW (ref >90)

## 2011-12-04 LAB — CBC
HCT: 34.1 % — ABNORMAL LOW (ref 39.0–52.0)
MCHC: 30.8 g/dL (ref 30.0–36.0)
MCV: 80.4 fL (ref 78.0–100.0)
Platelets: 144 10*3/uL — ABNORMAL LOW (ref 150–400)
RDW: 17 % — ABNORMAL HIGH (ref 11.5–15.5)
WBC: 8.5 10*3/uL (ref 4.0–10.5)

## 2011-12-04 MED ORDER — CARVEDILOL 6.25 MG PO TABS: 18.7500 mg | ORAL_TABLET | Freq: Two times a day (BID) | ORAL | Status: DC

## 2011-12-04 MED ORDER — OMEPRAZOLE 40 MG PO CPDR: 40.0000 mg | DELAYED_RELEASE_CAPSULE | Freq: Every day | ORAL | Status: DC

## 2011-12-04 MED ORDER — LISINOPRIL 2.5 MG PO TABS: 2.5000 mg | ORAL_TABLET | Freq: Every day | ORAL | Status: DC

## 2011-12-04 NOTE — Progress Notes (Signed)
Patient ID: Jacob [name], male   DOB: [birth year], 78 y.o.   MRN: [id number] Subjective:  No chest pain or sob.  Objective:  Vital Signs in the last 24 hours: Temp:  [97.1 F (36.2 C)-98.4 F (36.9 C)] 98.4 F (36.9 C) (06/22 0406) Pulse Rate:  [60-75] 75  (06/22 0406) Resp:  [18] 18  (06/22 0406) BP: (121-163)/(66-77) 162/66 mmHg (06/22 0406) SpO2:  [96 %] 96 % (06/22 0406)  Intake/Output from previous day: 06/21 0701 - 06/22 0700 In: 570 [P.O.:240; I.V.:330] Out: 800 [Urine:800] Intake/Output from this shift:    Physical Exam: Well appearing NAD HEENT: Unremarkable Neck:  No JVD, no thyromegally Lungs:  Clear with no wheezes. HEART:  Regular rate rhythm, no murmurs, no rubs, no clicks Abd:  Flat, positive bowel sounds, no organomegally, no rebound, no guarding Ext:  2 plus pulses, no edema, no cyanosis, no clubbing Skin:  No rashes no nodules Neuro:  CN II through XII intact, motor grossly intact  Lab Results:  Basename 12/04/11 0600 12/02/11 2124  WBC 8.5 7.7  HGB 10.5* 10.0*  PLT 144* 148*    Basename 12/04/11 0600 12/03/11 0457  NA 141 143  K 4.0 4.6  CL 106 109  CO2 25 28  GLUCOSE 96 85  BUN 14 15  CREATININE 1.16 1.21    Basename 12/03/11 1934 12/03/11 1140  TROPONINI <0.30 <0.30   Hepatic Function Panel  Basename 12/03/11 0457  PROT 6.3  ALBUMIN 3.3*  AST 12  ALT 9  ALKPHOS 66  BILITOT 0.4  BILIDIR --  IBILI --   No results found for this basename: CHOL in the last 72 hours No results found for this basename: PROTIME in the last 72 hours  Imaging: Dg Chest 2 View  12/02/2011  *RADIOLOGY REPORT*  Clinical Data: Cough and pleuritic chest pain.  History of ischemic cardiomyopathy.  CHEST - 2 VIEW  Comparison: Chest radiograph performed 03/02/2011  Findings: The lungs are well-aerated.  Pulmonary vascularity is at the upper limits of normal.  There is no evidence of focal opacification, pleural effusion or pneumothorax.  The heart is normal in  size; a pacemaker/AICD is noted at the left chest wall, with leads ending at the right atrium and right ventricle.  No acute osseous abnormalities are seen.  IMPRESSION: No acute cardiopulmonary process seen.  Original Report Authenticated By: Tonia Ghent, M.D.   [facility]  12/03/2011  *RADIOLOGY REPORT*  Clinical Data: Elevated D-dimer.  Back and chest pain.  NM PULMONARY PERFUSION PARTICULATE  Radiopharmaceutical: CURIE MAA TECHNETIUM TO 38M ALBUMIN AGGREGATED  Comparison: Plain film of the chest 12/02/2011.  Findings: Radiotracer distribution is mildly heterogeneous but no segmental or subsegmental defect is identified.  IMPRESSION: Low probability for pulmonary embolus.  Original Report Authenticated By: Bernadene Bell. Maricela Curet, M.D.    Cardiac Studies: Tele - NSR with atrial pacing Assessment/Plan:  1. Chest pain 2. CAD 3. Difficulty swallowing Rec: Ok from my perspective to discharge with outpatient GI eval. I suspect he will need esophageal dilatation. Continue home cardiac meds.  LOS: 2 days    Lewayne Bunting 12/04/2011, 8:20 AM

## 2011-12-04 NOTE — Progress Notes (Signed)
Pt. Discharged 12/04/2011  12:03 PM Discharge instructions reviewed with patient/family. Patient/family verbalized understanding. All Rx's given. Questions answered as needed. Pt. Discharged to home with family/self.  Annie Sable

## 2011-12-04 NOTE — Discharge Summary (Signed)
Discharge Summary  Jacob [name] MR#: [id number]  DOB:February 24, 1932  Date of Admission: 12/02/2011 Date of Discharge: 12/04/2011  Patient's PCP: [name], MD  Attending Physician:Izmael Duross  Consults: Treatment Team:  #1: Cardiology: Dr. Freida Busman McLean/Dr. Sharrell Ku  [facility], MD   Discharge Diagnoses: Chest pain Present on Admission:  .Chest pain .HYPERCHOLESTEROLEMIA, MIXED .HYPERTENSION .Atrial fibrillation .GERD .CORONARY ATHEROSCLEROSIS, NATIVE VESSEL .BENIGN PROSTATIC HYPERTROPHY, WITH OBSTRUCTION .Urinary retention .TIA (transient ischemic attack)   Brief Admitting History and Physical A 78 year old man with known history of ischemic cardiomyopathy and coronary artery disease his last EF was 30-45%. Patient came in secondary to transient centrally located chest pain radiating to his left chin. He took up to 5 nitroglycerin as did not have much 122 to the emergency room. He he received nitroglycerin in the emergency room on morphine his chest pain is now gone. Initial cardiac enzymes are negative and his EKGs is unchanged from previous. Due to patient's known history of ischemic cardiomyopathy his being admitted for further workup. His pain and his high was normal as 6/10 no diaphoresis no nausea and vomiting. For the rest of the admission history and physical please see H&P dictated by Dr. Mikeal Hawthorne.  Discharge Medications Medication List  As of 12/04/2011 10:09 AM   START taking these medications         lisinopril 2.5 MG tablet   Commonly known as: PRINIVIL,ZESTRIL   Take 1 tablet (2.5 mg total) by mouth daily.         CHANGE how you take these medications         carvedilol 6.25 MG tablet   Commonly known as: COREG   Take 3 tablets (18.75 mg total) by mouth 2 (two) times daily with a meal.   What changed: - medication strength - dose      omeprazole 40 MG capsule   Commonly known as: PRILOSEC   Take 1 capsule (40 mg total) by mouth daily.   What changed: - medication strength - dose         CONTINUE taking these medications         AVODART 0.5 MG capsule   Generic drug: dutasteride      cyanocobalamin 100 MCG tablet      fluticasone 50 MCG/ACT nasal spray   Commonly known as: FLONASE   Place 2 sprays into the nose daily as needed for allergies.      HYDROcodone-acetaminophen 5-325 MG per tablet   Commonly known as: NORCO      isosorbide mononitrate 30 MG 24 hr tablet   Commonly known as: IMDUR      loratadine 10 MG tablet   Commonly known as: CLARITIN   Take 1 tablet (10 mg total) by mouth daily as needed for allergies.      NITROSTAT 0.4 MG SL tablet   Generic drug: nitroGLYCERIN      PRADAXA 150 MG Caps   Generic drug: dabigatran      simvastatin 40 MG tablet   Commonly known as: ZOCOR      solifenacin 5 MG tablet   Commonly known as: VESICARE      Tamsulosin HCl 0.4 MG Caps   Commonly known as: FLOMAX         STOP taking these medications         aspirin 325 MG tablet          Where to get your medications    These are the prescriptions that you need to pick  up.   You may get these medications from any pharmacy.         carvedilol 6.25 MG tablet   lisinopril 2.5 MG tablet   omeprazole 40 MG capsule           Hospital Course: #1: Chest pain Patient was admitted with chest pain which had improved after nitroglycerin and morphine. Due to patient's extensive cardiac history he was admitted to the telemetry floor for chest pain rule out. D. dimer which was obtained on admission and came back elevated at 0.99 and a such a VQ scan was obtained to rule out PE. VQ scan which was done had a low probability for PE. Patient did not have any further chest pain during the hospitalization however did endorse some heartburn at night prior to discharge. Due to his extensive cardiac history a cardiology consultation was obtained and patient was seen in consultation by Dr. Jearld Pies of [facility] cardiology  on 12/03/2011. It was felt per cardiology that patient's chest pain was likely GI related as patient related to cardiology that his pain third of 28 when he fell foot getting stuck on its way down. Cardiac enzymes were cycled which were negative x3. Patient was maintained on a PPI. Patient did have a history of esophageal strictures and was felt that patient will benefit from a GI evaluation as outpatient. Patient did not have any further chest pain did have some complaints of heartburn. Patient's cardiac medications were adjusted per cardiology. His Coreg was increased to 18.75 mg twice daily and he was started on a low-dose lisinopril at 2.5 mg daily. Patient's aspirin was discontinued per cardiology as patient was on Pradaxa and was felt that patient would have an increased leading risk without appreciable benefit if he was on both Pradaxa and aspirin. Patient remained in stable condition be discharged home in stable and improved condition. Patient has been instructed to followup with his gastroenterologist, Dr. Russella Dar one week post discharge for further evaluation of his chest pain which is felt to be esophageal/GI related. Patient be discharged in stable and improved condition. Patient will be discharged home on an increased dose of omeprazole 40 mg daily.  #2 atrial fibrillation During the hospitalization patient remained rate controlled. Patient's Coreg was increased to 18.75 mg twice daily per cardiology. Patient's aspirin was discontinued as patient was also on pradaxa. Patient will followup with cardiology as scheduled.  #3 chronic systolic CHF Remained stable throughout the hospitalization. Patient had an echo in May of 2013 that had an EF of 45-50%. Due to slight elevation in patient's blood pressure his Coreg was increased to 18.75 mg twice daily per cardiology and was started on low-dose lisinopril at 2.5 mg daily. Patient will followup with cardiology and his PCP as outpatient.  The rest of  patient's chronic medical issues remained stable throughout the hospitalization and patient be discharged in stable and improved condition. Is been a pleasure taking care of [name].   Present on Admission:  .Chest pain .HYPERCHOLESTEROLEMIA, MIXED .HYPERTENSION .Atrial fibrillation .GERD .CORONARY ATHEROSCLEROSIS, NATIVE VESSEL .BENIGN PROSTATIC HYPERTROPHY, WITH OBSTRUCTION .Urinary retention .TIA (transient ischemic attack)   Day of Discharge BP 122/56  Pulse 75  Temp 98.4 F (36.9 C) (Oral)  Resp 18  Ht 5\' 5"  (1.651 m)  Wt 84.46 kg (186 lb 3.2 oz)  BMI 30.99 kg/m2  SpO2 96% Subjective: Patient denies any chest pain. No shortness of breath. General: Alert, awake, oriented x3, in no acute distress. HEENT: No bruits, no goiter.  Heart: Regular rate and rhythm, without murmurs, rubs, gallops. Lungs: Clear to auscultation bilaterally. Abdomen: Soft, nontender, nondistended, positive bowel sounds. Extremities: No clubbing cyanosis or edema with positive pedal pulses. Neuro: Grossly intact, nonfocal.   Results for orders placed during the hospital encounter of 12/02/11 (from the past 48 hour(s))  PRO B NATRIURETIC PEPTIDE     Status: Abnormal   Collection Time   12/02/11  9:23 PM      Component Value Range Comment   Pro B Natriuretic peptide (BNP) 760.9 (*) 0 - 450 pg/mL   CBC     Status: Abnormal   Collection Time   12/02/11  9:24 PM      Component Value Range Comment   WBC 7.7  4.0 - 10.5 K/uL    RBC 4.02 (*) 4.22 - 5.81 MIL/uL    Hemoglobin 10.0 (*) 13.0 - 17.0 g/dL    HCT 16.1 (*) 09.6 - 52.0 %    MCV 80.8  78.0 - 100.0 fL    MCH 24.9 (*) 26.0 - 34.0 pg    MCHC 30.8  30.0 - 36.0 g/dL    RDW 04.5 (*) 40.9 - 15.5 %    Platelets 148 (*) 150 - 400 K/uL   DIFFERENTIAL     Status: Normal   Collection Time   12/02/11  9:24 PM      Component Value Range Comment   Neutrophils Relative 61  43 - 77 %    Neutro Abs 4.7  1.7 - 7.7 K/uL    Lymphocytes Relative 28  12 - 46  %    Lymphs Abs 2.1  0.7 - 4.0 K/uL    Monocytes Relative 8  3 - 12 %    Monocytes Absolute 0.6  0.1 - 1.0 K/uL    Eosinophils Relative 3  0 - 5 %    Eosinophils Absolute 0.2  0.0 - 0.7 K/uL    Basophils Relative 1  0 - 1 %    Basophils Absolute 0.0  0.0 - 0.1 K/uL   COMPREHENSIVE METABOLIC PANEL     Status: Abnormal   Collection Time   12/02/11  9:24 PM      Component Value Range Comment   Sodium 142  135 - 145 mEq/L    Potassium 4.8  3.5 - 5.1 mEq/L    Chloride 107  96 - 112 mEq/L    CO2 27  19 - 32 mEq/L    Glucose, Bld 86  70 - 99 mg/dL    BUN 16  6 - 23 mg/dL    Creatinine, Ser 8.11  0.50 - 1.35 mg/dL    Calcium 8.6  8.4 - 91.4 mg/dL    Total Protein 6.3  6.0 - 8.3 g/dL    Albumin 3.3 (*) 3.5 - 5.2 g/dL    AST 12  0 - 37 U/L    ALT 10  0 - 53 U/L    Alkaline Phosphatase 68  39 - 117 U/L    Total Bilirubin 0.3  0.3 - 1.2 mg/dL    GFR calc non Af Amer 52 (*) >90 mL/min    GFR calc Af Amer 60 (*) >90 mL/min   PROTIME-INR     Status: Abnormal   Collection Time   12/02/11  9:24 PM      Component Value Range Comment   Prothrombin Time 19.1 (*) 11.6 - 15.2 seconds    INR 1.57 (*) 0.00 - 1.49   APTT  Status: Abnormal   Collection Time   12/02/11  9:24 PM      Component Value Range Comment   aPTT 68 (*) 24 - 37 seconds   D-DIMER, QUANTITATIVE     Status: Abnormal   Collection Time   12/02/11  9:24 PM      Component Value Range Comment   D-Dimer, Quant 0.99 (*) 0.00 - 0.48 ug/mL-FEU   URINALYSIS, ROUTINE W REFLEX MICROSCOPIC     Status: Abnormal   Collection Time   12/02/11  9:31 PM      Component Value Range Comment   Color, Urine AMBER (*) YELLOW BIOCHEMICALS MAY BE AFFECTED BY COLOR   APPearance CLEAR  CLEAR    Specific Gravity, Urine 1.030  1.005 - 1.030    pH 5.5  5.0 - 8.0    Glucose, UA NEGATIVE  NEGATIVE mg/dL    Hgb urine dipstick NEGATIVE  NEGATIVE    Bilirubin Urine SMALL (*) NEGATIVE    Ketones, ur NEGATIVE  NEGATIVE mg/dL    Protein, ur 30 (*) NEGATIVE  mg/dL    Urobilinogen, UA 0.2  0.0 - 1.0 mg/dL    Nitrite NEGATIVE  NEGATIVE    Leukocytes, UA NEGATIVE  NEGATIVE   URINE MICROSCOPIC-ADD ON     Status: Normal   Collection Time   12/02/11  9:31 PM      Component Value Range Comment   Squamous Epithelial / LPF RARE  RARE    WBC, UA 0-2  <3 WBC/hpf    RBC / HPF 0-2  <3 RBC/hpf   POCT I-STAT TROPONIN I     Status: Normal   Collection Time   12/02/11  9:57 PM      Component Value Range Comment   Troponin i, poc 0.00  0.00 - 0.08 ng/mL    Comment 3            CARDIAC PANEL(CRET KIN+CKTOT+MB+TROPI)     Status: Normal   Collection Time   12/03/11  4:57 AM      Component Value Range Comment   Total CK 47  7 - 232 U/L    CK, MB 1.9  0.3 - 4.0 ng/mL    Troponin I <0.30  <0.30 ng/mL    Relative Index RELATIVE INDEX IS INVALID  0.0 - 2.5   TSH     Status: Normal   Collection Time   12/03/11  4:57 AM      Component Value Range Comment   TSH 2.522  0.350 - 4.500 uIU/mL   PRO B NATRIURETIC PEPTIDE     Status: Abnormal   Collection Time   12/03/11  4:57 AM      Component Value Range Comment   Pro B Natriuretic peptide (BNP) 620.6 (*) 0 - 450 pg/mL   HEMOGLOBIN A1C     Status: Abnormal   Collection Time   12/03/11  4:57 AM      Component Value Range Comment   Hemoglobin A1C 6.0 (*) <5.7 %    Mean Plasma Glucose 126 (*) <117 mg/dL   COMPREHENSIVE METABOLIC PANEL     Status: Abnormal   Collection Time   12/03/11  4:57 AM      Component Value Range Comment   Sodium 143  135 - 145 mEq/L    Potassium 4.6  3.5 - 5.1 mEq/L    Chloride 109  96 - 112 mEq/L    CO2 28  19 - 32 mEq/L    Glucose, Bld  85  70 - 99 mg/dL    BUN 15  6 - 23 mg/dL    Creatinine, Ser 1.61  0.50 - 1.35 mg/dL    Calcium 8.6  8.4 - 09.6 mg/dL    Total Protein 6.3  6.0 - 8.3 g/dL    Albumin 3.3 (*) 3.5 - 5.2 g/dL    AST 12  0 - 37 U/L    ALT 9  0 - 53 U/L    Alkaline Phosphatase 66  39 - 117 U/L    Total Bilirubin 0.4  0.3 - 1.2 mg/dL    GFR calc non Af Amer 56 (*) >90  mL/min    GFR calc Af Amer 64 (*) >90 mL/min   CARDIAC PANEL(CRET KIN+CKTOT+MB+TROPI)     Status: Normal   Collection Time   12/03/11 11:40 AM      Component Value Range Comment   Total CK 48  7 - 232 U/L    CK, MB 1.9  0.3 - 4.0 ng/mL    Troponin I <0.30  <0.30 ng/mL    Relative Index RELATIVE INDEX IS INVALID  0.0 - 2.5   CARDIAC PANEL(CRET KIN+CKTOT+MB+TROPI)     Status: Normal   Collection Time   12/03/11  7:34 PM      Component Value Range Comment   Total CK 45  7 - 232 U/L    CK, MB 1.7  0.3 - 4.0 ng/mL    Troponin I <0.30  <0.30 ng/mL    Relative Index RELATIVE INDEX IS INVALID  0.0 - 2.5   BASIC METABOLIC PANEL     Status: Abnormal   Collection Time   12/04/11  6:00 AM      Component Value Range Comment   Sodium 141  135 - 145 mEq/L    Potassium 4.0  3.5 - 5.1 mEq/L    Chloride 106  96 - 112 mEq/L    CO2 25  19 - 32 mEq/L    Glucose, Bld 96  70 - 99 mg/dL    BUN 14  6 - 23 mg/dL    Creatinine, Ser 0.45  0.50 - 1.35 mg/dL    Calcium 8.9  8.4 - 40.9 mg/dL    GFR calc non Af Amer 58 (*) >90 mL/min    GFR calc Af Amer 68 (*) >90 mL/min   CBC     Status: Abnormal   Collection Time   12/04/11  6:00 AM      Component Value Range Comment   WBC 8.5  4.0 - 10.5 K/uL    RBC 4.24  4.22 - 5.81 MIL/uL    Hemoglobin 10.5 (*) 13.0 - 17.0 g/dL    HCT 81.1 (*) 91.4 - 52.0 %    MCV 80.4  78.0 - 100.0 fL    MCH 24.8 (*) 26.0 - 34.0 pg    MCHC 30.8  30.0 - 36.0 g/dL    RDW 78.2 (*) 95.6 - 15.5 %    Platelets 144 (*) 150 - 400 K/uL     Dg Chest 2 View  12/02/2011  *RADIOLOGY REPORT*  Clinical Data: Cough and pleuritic chest pain.  History of ischemic cardiomyopathy.  CHEST - 2 VIEW  Comparison: Chest radiograph performed 03/02/2011  Findings: The lungs are well-aerated.  Pulmonary vascularity is at the upper limits of normal.  There is no evidence of focal opacification, pleural effusion or pneumothorax.  The heart is normal in size; a pacemaker/AICD is noted at the left chest  wall,  with leads ending at the right atrium and right ventricle.  No acute osseous abnormalities are seen.  IMPRESSION: No acute cardiopulmonary process seen.  Original Report Authenticated By: Tonia Ghent, M.D.   Ct Head Wo Contrast  11/07/2011  *RADIOLOGY REPORT*  Clinical Data: TIA  CT HEAD WITHOUT CONTRAST  Technique:  Contiguous axial images were obtained from the base of the skull through the vertex without contrast.  Comparison: None.  Findings: Chronic ischemic changes in the basal ganglia.  Mild global atrophy.  No mass effect, midline shift, or acute intracranial hemorrhage.  Mastoid air cells are clear.  Cranium is intact.  IMPRESSION: No acute intracranial pathology.  Original Report Authenticated By: Donavan Burnet, M.D.   Nm Pulmonary Perfusion  12/03/2011  *RADIOLOGY REPORT*  Clinical Data: Elevated D-dimer.  Back and chest pain.  NM PULMONARY PERFUSION PARTICULATE  Radiopharmaceutical: CURIE MAA TECHNETIUM TO 67M ALBUMIN AGGREGATED  Comparison: Plain film of the chest 12/02/2011.  Findings: Radiotracer distribution is mildly heterogeneous but no segmental or subsegmental defect is identified.  IMPRESSION: Low probability for pulmonary embolus.  Original Report Authenticated By: Bernadene Bell. Maricela Curet, M.D.     Disposition: Home  Diet: Low sodium heart healthy  Activity: Increase activity slowly   Follow-up Appts: Discharge Orders    Future Appointments: Provider: Department: Dept Phone: Center:   12/14/2011 11:30 AM Wendall Stade, MD [facility] [phone] [facility]   02/10/2012 10:00 AM [facility] [place] [phone] [facility]   02/23/2012 10:15 AM Newt Lukes, MD [facility] [phone] [facility]     Future Orders Please Complete By Expires   Diet - low sodium heart healthy      Increase activity slowly      Discharge instructions      Comments:   fOLLOW UP WITH [name], MD in 1 -2 weeks Follow up with Dr Russella Dar, [facility] in  1 week      TESTS THAT NEED FOLLOW-UP   Time spent on discharge, talking to the patient, and coordinating care: 50 mins.   Signed: Trisha Ken [phone] 12/04/2011, 10:09 AM

## 2011-12-06 ENCOUNTER — Encounter: Payer: Self-pay | Admitting: Gastroenterology

## 2011-12-08 ENCOUNTER — Ambulatory Visit: Payer: Medicare Other | Admitting: Internal Medicine

## 2011-12-09 ENCOUNTER — Encounter: Payer: Self-pay | Admitting: Internal Medicine

## 2011-12-09 ENCOUNTER — Ambulatory Visit (INDEPENDENT_AMBULATORY_CARE_PROVIDER_SITE_OTHER): Payer: Medicare Other | Admitting: Internal Medicine

## 2011-12-09 VITALS — BP 132/70 | HR 72 | Temp 97.9°F | Ht 67.0 in | Wt 184.1 lb

## 2011-12-09 DIAGNOSIS — I2510 Atherosclerotic heart disease of native coronary artery without angina pectoris: Secondary | ICD-10-CM

## 2011-12-09 DIAGNOSIS — R079 Chest pain, unspecified: Secondary | ICD-10-CM | POA: Diagnosis not present

## 2011-12-09 DIAGNOSIS — G459 Transient cerebral ischemic attack, unspecified: Secondary | ICD-10-CM | POA: Diagnosis not present

## 2011-12-09 DIAGNOSIS — K219 Gastro-esophageal reflux disease without esophagitis: Secondary | ICD-10-CM | POA: Diagnosis not present

## 2011-12-09 NOTE — Assessment & Plan Note (Signed)
Felt to be cause of chest pain prompting 11/2011 hospitalization Encouraged compliace with PPI as rx'd and follow up GI as planned 01/10/12

## 2011-12-09 NOTE — Assessment & Plan Note (Signed)
Transient event - 12h in 10/2011 - resolved R facial droop and RUE weakness prior to ER presentation No MRI/MRA due to AICD S/p neuro eval - neg carotids Careful hx reveals he appears to be on chronic anticoag (pradaxa) prior to and during 10/2011 TIA event (known AF) Continue pradaxa and ASA 325 qd until follow up with stroke (dr Pearlean Brownie) 12/2011 - ?if still need to consider decrease ASA to 81 in light of cardiac hx - defer to cards/neuro  Recent hospitalizations (5/13 nd 6/13) labs/tests and meds reviewed in depth with pt today, 25 min with >50% time counseling pt on same

## 2011-12-09 NOTE — Progress Notes (Signed)
  Subjective:    Patient ID: Jacob [name], male    DOB: 1931-11-26, 78 y.o.   MRN: [id number]  HPI  Here for hospital follow up - 6/20-22, 2013 for chest pain - DC summary reviewed No further CP since at home, inconsistent PPI use since DC hosp 10/2011 for TIA event  Past Medical History  Diagnosis Date  . Chronic systolic heart failure     EF 40-98% in past;  Echocardiogram 11/08/11: Normal LV wall thickness, EF 45-50%.  Marland Kitchen HTN (hypertension)   . History of diverticulitis of colon   . GERD (gastroesophageal reflux disease)   . History of nephrolithiasis   . Ischemic cardiomyopathy     s/p ICD 2008 (see CHF for EF)  . Urinary retention     foley indwelling 12/2010-04/2011  . Arthritis   . HLD (hyperlipidemia)   . Barrett's esophagus   . Coronary artery disease     a. Chronically occ RCA. b. Last cardiac catheterization 10/2010: Mid LAD 20-30%, distal LAD 40%, mid D1 40%, mid circumflex 20-30%, distal RCA occluded with left to right collaterals.  . Benign prostatic hypertrophy   . TIA (transient ischemic attack) 10/2011    carotids neg for ICA stenosis  . Atrial fibrillation   . Skin cancer   . BPH (benign prostatic hyperplasia)   . Renal insufficiency     Renal failure in July 2012 secondary to hypotension, ARB use, and     Review of Systems  Constitutional: Positive for fatigue. Negative for fever and chills.  Respiratory: Negative for cough and shortness of breath.   Cardiovascular: Negative for chest pain.  Genitourinary: Negative for flank pain and testicular pain.       Objective:   Physical Exam  BP 132/70  Pulse 72  Temp 97.9 F (36.6 C) (Oral)  Ht 5\' 7"  (1.702 m)  Wt 184 lb 1.9 oz (83.516 kg)  BMI 28.84 kg/m2  SpO2 98%. Wt Readings from Last 3 Encounters:  12/09/11 184 lb 1.9 oz (83.516 kg)  12/03/11 186 lb 3.2 oz (84.46 kg)  11/24/11 184 lb (83.462 kg)   Constitutional:  He appears well-developed and well-nourished. No distress.  Neck: Normal range of  motion. Neck supple. No JVD or bruits present.  Cardiovascular: Normal rate, regular rhythm and normal heart sounds.  No murmur heard. no BLE edema Pulmonary/Chest: Effort normal and breath sounds normal. No respiratory distress. no wheezes. Abd: SNTND, +BS Neurological: he is alert and oriented to person, place, and time. No cranial nerve deficit. Coordination normal. speech hesitant but fluent and appropriate content Psychiatric: he has a normal mood and affect. behavior is normal. Judgment and thought content normal.    Lab Results  Component Value Date   WBC 8.5 12/04/2011   HGB 10.5* 12/04/2011   HCT 34.1* 12/04/2011   PLT 144* 12/04/2011   GLUCOSE 96 12/04/2011   CHOL 119 11/08/2011   TRIG 127 11/08/2011   HDL 29* 11/08/2011   LDLDIRECT 54.3 08/26/2010   LDLCALC 65 11/08/2011   ALT 9 12/03/2011   AST 12 12/03/2011   NA 141 12/04/2011   K 4.0 12/04/2011   CL 106 12/04/2011   CREATININE 1.16 12/04/2011   BUN 14 12/04/2011   CO2 25 12/04/2011   TSH 2.522 12/03/2011   INR 1.57* 12/02/2011   HGBA1C 6.0* 12/03/2011       Assessment & Plan:  See problem list. Medications and labs reviewed today.

## 2011-12-09 NOTE — Assessment & Plan Note (Signed)
hosp 11/2011 for chest pain reviewed: neg tele, cardiac enz x 3 and low prob V/Q meds adjusted with increased Coreg dose and added ACEI (low dose lisinopril) follow up cards 12/14/11 as planned

## 2011-12-09 NOTE — Patient Instructions (Signed)
It was good to see you today. We have reviewed your hospital records from 10/2011 AND 11/2011 including labs and tests today Medications reviewed and updated, no prescription changes recommended at this time. See below for list of changes made during 11/2011 hospitalization Continue Pradaxa AND full dose aspirin daily until your visit with stroke doctor Pearlean Brownie) for follow up  Keep follow up with cardiology (nishan) and GI Russella Dar) as planned in 12/2011 Please keep schedule followup with me as planned, call sooner if problems.

## 2011-12-14 ENCOUNTER — Ambulatory Visit (INDEPENDENT_AMBULATORY_CARE_PROVIDER_SITE_OTHER): Payer: Medicare Other | Admitting: Cardiovascular Disease

## 2011-12-14 ENCOUNTER — Encounter: Payer: Self-pay | Admitting: Cardiovascular Disease

## 2011-12-14 VITALS — BP 129/49 | HR 55 | Wt 185.0 lb

## 2011-12-14 DIAGNOSIS — E7800 Pure hypercholesterolemia, unspecified: Secondary | ICD-10-CM | POA: Diagnosis not present

## 2011-12-14 DIAGNOSIS — I2510 Atherosclerotic heart disease of native coronary artery without angina pectoris: Secondary | ICD-10-CM | POA: Diagnosis not present

## 2011-12-14 DIAGNOSIS — Z95810 Presence of automatic (implantable) cardiac defibrillator: Secondary | ICD-10-CM

## 2011-12-14 DIAGNOSIS — I4891 Unspecified atrial fibrillation: Secondary | ICD-10-CM

## 2011-12-14 DIAGNOSIS — I10 Essential (primary) hypertension: Secondary | ICD-10-CM | POA: Diagnosis not present

## 2011-12-14 DIAGNOSIS — K219 Gastro-esophageal reflux disease without esophagitis: Secondary | ICD-10-CM

## 2011-12-14 DIAGNOSIS — I509 Heart failure, unspecified: Secondary | ICD-10-CM

## 2011-12-14 NOTE — Progress Notes (Signed)
Patient ID: Jacob [name], male   DOB: 1931/08/28, 78 y.o.   MRN: [id number] Jacob [name] is a 78 y/o M with somewhat of a complex history, especially recently. He has known ICM/CHF (EF previously 25-30%, most recent 45-50%), CAD with chronically occ RCA, atrial fibrillation, CKD. He was admitted 5/26-5/27 with a TIA with R sided facial drooping. He had neg carotid dopplers, EF 45-50%; no MRI due to ICD. At time of his TIA it was noted he was only taking ASA intermittently so was placed on 325mg  daily. However, there is some question about ongoing Pradaxa use despite instruction otherwise. He has a hx of AFib noted on interrogation of his device and was placed on Pradaxa in 04/2010. He was admitted 12/2010 following TKR with ABL anemia, hematuria, ARF, BPH and transfused and Pradaxa was continued at that time. OV with Dr. Eden Emms 03/2011 says he was not a coumadin candidate given hematuria while on Pradaxa - the patient was instructed to stop his Pradaxa shortly after that visit. The patient was admitted for TURP 06/2011 and was instructed to stop Pradaxa per that discharge summary. He was seen in our office by Tereso Newcomer PA-C on 11/16/11 at which time the patient said he was still taking Pradaxa but was felt to be doing stable on it. This was continued in light of recent TIA. He was instructed to talk to neurologist about decreasing to 81mg  of ASA, but the patient is not sure when his appointment (or if he has one) with Dr. Pearlean Brownie.   Just D/C on 6/20 from [facility] with complaints of chest pain. He reported  it as L-sided radiating across to the middle of his chest, fairly constant for the last week except that it spontaneously resolved when EMS arrived yesterday and he has been pain free since. It would get transiently worse at times, particularly when he would eat food and felt like it would get caught. This goes away on its own. However, he has also noticed at times it is worse with exertion. He thinks it might be resolved  with rest, but is not sure. He has had no associated SOB, diaphoresis, nausea, vomiting, abdominal pain, palpitations, weight gain, orthopnea or syncope. He denies ICD discharge. He has been taking all of his medicines per his report.   Last cardiac catheterization showed stable anatomy in 10/2010: Mid LAD 20-30%, distal LAD 40%, mid D1 40%, mid circumflex 20-30%, distal RCA occluded with left to right collaterals. ICD was just interrogated 3 weeks ago demonstrating normal device function, no ventricular arrhythmias.  Dr Shirlee Latch recommended possible outpatient myovue and stopping ASA since he is on Pradaxa.  ROS: Denies fever, malais, weight loss, blurry vision, decreased visual acuity, cough, sputum, SOB, hemoptysis, pleuritic pain, palpitaitons, heartburn, abdominal pain, melena, lower extremity edema, claudication, or rash.  All other systems reviewed and negative  General: Affect appropriate Chronically ill male HEENT: normal Neck supple with no adenopathy JVP normal no bruits no thyromegaly Lungs clear with no wheezing and good diaphragmatic motion Heart:  S1/S2 no murmur, no rub, gallop or click PMI normal Abdomen: benighn, BS positve, no tenderness, no AAA no bruit.  No HSM or HJR Distal pulses intact with no bruits No edema Neuro non-focal Skin warm and dry No muscular weakness   Current Outpatient Prescriptions  Medication Sig Dispense Refill  . carvedilol (COREG) 6.25 MG tablet Take 3 tablets (18.75 mg total) by mouth 2 (two) times daily with a meal.  180 tablet  0  .  cyanocobalamin 100 MCG tablet Take 100 mcg by mouth daily.       Marland Kitchen dutasteride (AVODART) 0.5 MG capsule Take 0.5 mg by mouth every morning.       . fluticasone (FLONASE) 50 MCG/ACT nasal spray Place 2 sprays into the nose daily as needed for allergies.  16 g  6  . HYDROcodone-acetaminophen (NORCO) 5-325 MG per tablet Take 1 tablet by mouth every 6 (six) hours as needed. Pain       . isosorbide mononitrate  (IMDUR) 30 MG 24 hr tablet Take 30 mg by mouth daily.      Marland Kitchen lisinopril (PRINIVIL,ZESTRIL) 2.5 MG tablet Take 1 tablet (2.5 mg total) by mouth daily.  30 tablet  0  . NITROSTAT 0.4 MG SL tablet Place 0.4 mg under the tongue every 5 (five) minutes as needed. For chest pain      . omeprazole (PRILOSEC) 40 MG capsule Take 1 capsule (40 mg total) by mouth daily.  30 capsule  0  . PRADAXA 150 MG CAPS Take 1 tablet by mouth Twice daily.      . simvastatin (ZOCOR) 40 MG tablet 40 mg every morning.       . solifenacin (VESICARE) 5 MG tablet Take 10 mg by mouth daily.        Allergies  Penicillins  Electrocardiogram: 6/24  A pacing with normal ventricular complexs  Assessment and Plan

## 2011-12-14 NOTE — Assessment & Plan Note (Signed)
Well controlled.  Continue current medications and low sodium Dash type diet.    

## 2011-12-14 NOTE — Assessment & Plan Note (Signed)
Normal function no shocks F/U Dr Ladona Ridgel

## 2011-12-14 NOTE — Assessment & Plan Note (Signed)
In NSR today.  Continue Pradaxa with history of TIA.  Will need pradaxa held for endoscopy

## 2011-12-14 NOTE — Assessment & Plan Note (Signed)
Has F/U with Dr Russella Dar  Prevoius dilatation of esophagus.  Ok to stop pradaxa if needed for EGD

## 2011-12-14 NOTE — Assessment & Plan Note (Signed)
Cholesterol is at goal.  Continue current dose of statin and diet Rx.  No myalgias or side effects.  F/U  LFT's in 6 months. Lab Results  Component Value Date   LDLCALC 65 11/08/2011

## 2011-12-14 NOTE — Patient Instructions (Signed)
Your physician wants you to follow-up in:   3 MONTHS WITH  DR NISHAN  You will receive a reminder letter in the mail two months in advance. If you don't receive a letter, please call our office to schedule the follow-up appointment. Your physician recommends that you continue on your current medications as directed. Please refer to the Current Medication list given to you today.  

## 2011-12-14 NOTE — Assessment & Plan Note (Signed)
Stable with no angina and good activity level.  Continue medical Rx Recent pain likley related to GERD and dysphagia.  No need for myovue at this time

## 2011-12-14 NOTE — Assessment & Plan Note (Signed)
Euvolemic with improved EF on most recent echo

## 2011-12-17 DIAGNOSIS — G459 Transient cerebral ischemic attack, unspecified: Secondary | ICD-10-CM | POA: Diagnosis not present

## 2011-12-28 DIAGNOSIS — M19019 Primary osteoarthritis, unspecified shoulder: Secondary | ICD-10-CM | POA: Diagnosis not present

## 2012-01-10 ENCOUNTER — Encounter: Payer: Self-pay | Admitting: Gastroenterology

## 2012-01-10 ENCOUNTER — Ambulatory Visit (INDEPENDENT_AMBULATORY_CARE_PROVIDER_SITE_OTHER): Payer: Medicare Other | Admitting: Gastroenterology

## 2012-01-10 VITALS — BP 140/60 | HR 80 | Ht 67.0 in | Wt 186.0 lb

## 2012-01-10 DIAGNOSIS — K219 Gastro-esophageal reflux disease without esophagitis: Secondary | ICD-10-CM

## 2012-01-10 DIAGNOSIS — R1319 Other dysphagia: Secondary | ICD-10-CM

## 2012-01-10 MED ORDER — OMEPRAZOLE 40 MG PO CPDR: 40.0000 mg | DELAYED_RELEASE_CAPSULE | Freq: Every day | ORAL | Status: DC

## 2012-01-10 NOTE — Progress Notes (Signed)
History of Present Illness: This is a 78 year old male was recently hospitalized in May for a TIA and in June for chest pain. I reviewed records from both hospitalizations. His chest pain was felt to be related to GERD and appeared to improve with increased compliance with omeprazole. He relates intermittent liquid and solid food dysphagia over the past 6 months that have possibly worsened since his TIA. It is difficult to get a detailed history from him. He has a history of an esophageal stricture and underwent upper endoscopy with dilation in 2011. Old records show a history of Barrett's esophagus however I reviewed all EGD biopsies in [facility] and there is no evidence of Barrett's esophagus, only GERD. Denies weight loss, abdominal pain, constipation, diarrhea, change in stool caliber, melena, hematochezia, nausea, vomiting.  Current Medications, Allergies, Past Medical History, Past Surgical History, Family History and Social History were reviewed in [employer] record.  Physical Exam: General: Well developed , well nourished, no acute distress Head: Normocephalic and atraumatic Eyes:  sclerae anicteric, EOMI Ears: Normal auditory acuity Mouth: No deformity or lesions Lungs: Clear throughout to auscultation Heart: Regular rate and rhythm; no murmurs, rubs or bruits Abdomen: Soft, non tender and non distended. No masses, hepatosplenomegaly or hernias noted. Normal Bowel sounds Musculoskeletal: Symmetrical with no gross deformities  Pulses:  Normal pulses noted Extremities: No clubbing, cyanosis, edema or deformities noted Neurological: Alert oriented x 4, grossly nonfocal except slightly slurred speech Psychological:  Alert and cooperative. Normal mood and affect  Assessment and Recommendations:  1. GERD is a likely cause of chest pain. Intensify all antireflux measures, written instructions supplied. Continue omeprazole 40 mg once daily. Would increase omeprazole to 40  mg twice a day if his symptoms are not adequately controlled.   2. Dysphagia to solids and liquids. This could be oropharyngeal or esophageal. Further evaluation with a modified speech pathology study and barium esophagram. Given his comorbidities including ischemic cardiomyopathy, Pradaxia anti-coagulation, AICD and his recent TIA he is at higher risk for endoscopic procedures at this time so we would like to avoid them if at all possible.

## 2012-01-10 NOTE — Patient Instructions (Addendum)
You have been scheduled for a Barium Esophogram at [facility] (1st floor of the hospital) on 01/17/12 at 10:30am. Please arrive 15 minutes prior to your appointment for registration. Make certain not to have anything to eat or drink 6 hours prior to your test. If you need to reschedule for any reason, please contact radiology at [phone] to do so.  We will call you back tomorrow if this does not work with Speech Pathology regarding the Modified Barium Swallow.   We have sent the following medications to your pharmacy for you to pick up at your convenience: Omeprazole. Patient advised to avoid spicy, acidic, citrus, chocolate, mints, fruit and fruit juices.  Limit the intake of caffeine, alcohol and Soda.  Don't exercise too soon after eating.  Don't lie down within 3-4 hours of eating.  Elevate the head of your bed.  cc: [name], MD

## 2012-01-17 ENCOUNTER — Ambulatory Visit (HOSPITAL_COMMUNITY)
Admission: RE | Admit: 2012-01-17 | Discharge: 2012-01-17 | Disposition: A | Discharge status: Home / Self Care | Payer: Medicare Other | Source: Ambulatory Visit | Attending: Gastroenterology | Admitting: Gastroenterology

## 2012-01-17 ENCOUNTER — Ambulatory Visit (HOSPITAL_COMMUNITY): Admission: RE | Admit: 2012-01-17 | Payer: Medicare Other | Source: Ambulatory Visit

## 2012-01-17 ENCOUNTER — Other Ambulatory Visit (HOSPITAL_COMMUNITY): Payer: Medicare Other

## 2012-01-17 DIAGNOSIS — K219 Gastro-esophageal reflux disease without esophagitis: Secondary | ICD-10-CM

## 2012-01-17 DIAGNOSIS — R1319 Other dysphagia: Secondary | ICD-10-CM

## 2012-01-17 DIAGNOSIS — K228 Other specified diseases of esophagus: Secondary | ICD-10-CM | POA: Diagnosis not present

## 2012-01-17 DIAGNOSIS — R1310 Dysphagia, unspecified: Secondary | ICD-10-CM | POA: Diagnosis not present

## 2012-01-17 DIAGNOSIS — K2289 Other specified disease of esophagus: Secondary | ICD-10-CM | POA: Diagnosis not present

## 2012-01-17 NOTE — Procedures (Addendum)
Objective Swallowing Evaluation: Modified Barium Swallowing Study  Patient Details  Name: Jacob [name] MRN: [id number] Date of Birth: November 15, 1931  Today's Date: 01/17/2012 Time: 1105-1201 SLP Time Calculation (min): 56 min  Past Medical History:  Past Medical History  Diagnosis Date  . Chronic systolic heart failure     EF 21-30% in past;  Echocardiogram 11/08/11: Normal LV wall thickness, EF 45-50%.  Marland Kitchen HTN (hypertension)   . History of diverticulitis of colon   . GERD (gastroesophageal reflux disease)   . History of nephrolithiasis   . Ischemic cardiomyopathy     s/p ICD 2008 (see CHF for EF)  . Urinary retention     foley indwelling 12/2010-04/2011  . Arthritis   . HLD (hyperlipidemia)   . Coronary artery disease     a. Chronically occ RCA. b. Last cardiac catheterization 10/2010: Mid LAD 20-30%, distal LAD 40%, mid D1 40%, mid circumflex 20-30%, distal RCA occluded with left to right collaterals.  . Benign prostatic hypertrophy   . TIA (transient ischemic attack) 10/2011    carotids neg for ICA stenosis  . Atrial fibrillation   . Skin cancer   . BPH (benign prostatic hyperplasia)   . Renal insufficiency     Renal failure in July 2012 secondary to hypotension, ARB use, and   . Esophageal stricture   . Hemorrhoids    Past Surgical History:  Past Surgical History  Procedure Date  . Cardiac defibrillator placement 12/2006    AICD  . Total knee arthroplasty 01/04/11    L TKA  . Cardiac catheterization 11-05-10  . Joint replacement   . Cataracts removed   . Transurethral resection of prostate 06/14/2011    Procedure: TRANSURETHRAL RESECTION OF THE PROSTATE (TURP);  Surgeon: Valetta Fuller, MD;  Location: [facility];  Service: Urology;  Laterality: N/A;   HPI:  78 yo male referred by Dr Russella Dar for MBS and esophagram (by radiologist) due to pt complaint of dysphagia to solid and liquid.  PMH + for GERD, ischemic cardiomyopathy, TIA.  Pt reports having undergone dilatation approx 2- 3  times - last being in 2011 with improved swallow function.  Pt takes a PPI and has had decr symptoms since increased compliance.  Per pt and spouse however he does consume mints and eats soon prior to going to bed.  Pt's wife reports he had TIA symptoms this week (decr right facial movement/droop) but did not go to the hospital because he wanted this testing done today and symptoms resolved.  Pt and spouse report chronic cough - worse in the middle of the night and some throughout the day-not coorelated to meals.   At times cough is productive to viscous secretions.   Pt also does acknowledge occasional problems with pills lodging in throat- he takes several at one time.  Pt denies pulmonary infections nor significant recent weight loss.       Assessment / Plan / Recommendation Clinical Impression  Dysphagia Diagnosis: Suspected primary esophageal dysphagia;Within Functional Limits Clinical impression: Pt presents with functional oropharyngeal swallow without aspiration or penetration of any consistency tested *cracker, pudding, nectar, thin.  Swallow was timely with only trace pharyngeal stasis of liquids that cleared with cued dry swallow.   Pt observed to cough prior to MBS (during interview) and after, but not during MBS.  He did not complain of any symptoms of dysphagia during MBS testing.    Pt does appear to have decr phonation strength, mild dysarthria x one year per family.  Also appeared with mild word finding difficulties therefore SLP advised family/pt to monitor oropharyngeal swallow.  Pt denies neuro diagnosis aside from TIA.    SLP reiterated reflux precautions that Dr Russella Dar had recommended.  Thanks for the referral.     Treatment Recommendation       Diet Recommendation Regular;Thin liquid   Supervision: Patient able to self feed Compensations: Slow rate;Small sips/bites (ocassional dry swallow) Postural Changes and/or Swallow Maneuvers: Seated upright 90 degrees;Upright 30-60 min  after meal    Other  Recommendations Oral Care Recommendations: Oral care BID   Follow Up Recommendations  None    Frequency and Duration        Pertinent Vitals/Pain     SLP Swallow Goals     General Date of Onset: 01/17/12 HPI: 78 yo male referred by Dr Russella Dar for MBS and esophagram (by radiologist) due to pt complaint of dysphagia to solid and liquid.  PMH + for GERD, ischemic cardiomyopathy, TIA.  Pt reports having undergone dilatation approx 2- 3 times - last being in 2011 with improved swallow function.  Pt takes a PPI and has had decr symptoms since increased compliance.  Per pt and spouse however he does consume mints and eats soon prior to going to bed.  Pt's wife reports he had TIA symptoms this week (decr right facial movement/droop) but did not go to the hospital because he wanted this testing done today and symptoms resolved.  Pt and spouse report chronic cough - worse in the middle of the night and some throughout the day-not coorelated to meals.   At times cough is productive to viscous secretions.   Pt also does acknowledge occasional problems with pills lodging in throat- he takes several at one time.  Pt denies pulmonary infections nor significant recent weight loss.   Type of Study: Modified Barium Swallowing Study Reason for Referral: Objectively evaluate swallowing function Diet Prior to this Study: Regular;Thin liquids Temperature Spikes Noted: No Respiratory Status: Room air History of Recent Intubation: No Behavior/Cognition: Alert;Cooperative;Pleasant mood Oral Cavity - Dentition: Dentures, top (no lowers: due to ill fitting - for years per pt) Oral Motor / Sensory Function: Within functional limits Self-Feeding Abilities: Able to feed self Patient Positioning: Upright in chair Baseline Vocal Quality: Low vocal intensity (family reports low vocal intensity and decr artic precision) Volitional Cough: Strong Volitional Swallow: Able to elicit Anatomy: Within  functional limits Pharyngeal Secretions: Not observed secondary MBS    Reason for Referral Objectively evaluate swallowing function   Oral Phase   WFL  Pharyngeal Phase   WFL  Cervical Esophageal Phase Cervical Esophageal Phase: [facility]    Functional Limitations: Swallowing Swallow Current Status ([id number]): At least 1 percent but less than 20 percent impaired, limited or restricted Swallow Goal Status [phone]): At least 1 percent but less than 20 percent impaired, limited or restricted Swallow Discharge Status [phone]): At least 1 percent but less than 20 percent impaired, limited or restricted   Chales Abrahams 01/17/2012, 12:25 PM

## 2012-02-10 ENCOUNTER — Ambulatory Visit (INDEPENDENT_AMBULATORY_CARE_PROVIDER_SITE_OTHER): Payer: Medicare Other | Admitting: *Deleted

## 2012-02-10 ENCOUNTER — Encounter: Payer: Self-pay | Admitting: Internal Medicine

## 2012-02-10 DIAGNOSIS — Z95810 Presence of automatic (implantable) cardiac defibrillator: Secondary | ICD-10-CM | POA: Diagnosis not present

## 2012-02-10 DIAGNOSIS — I4891 Unspecified atrial fibrillation: Secondary | ICD-10-CM

## 2012-02-10 LAB — ICD DEVICE OBSERVATION
AL AMPLITUDE: 2.3 mv
BATTERY VOLTAGE: 2.93 V
CHARGE TIME: 9 s
DEVICE MODEL ICD: 504434
HV IMPEDENCE: 42 Ohm
RV LEAD IMPEDENCE ICD: 972 Ohm
RV LEAD THRESHOLD: 1.2 V
TZAT-0001FASTVT: 1
TZAT-0013FASTVT: 2
TZAT-0018FASTVT: NEGATIVE
TZAT-0018FASTVT: NEGATIVE
TZST-0001FASTVT: 3
TZST-0001FASTVT: 6
TZST-0001FASTVT: 7
TZST-0003FASTVT: 41 J
TZST-0003FASTVT: 41 J

## 2012-02-10 NOTE — Progress Notes (Signed)
defib check in clinic  

## 2012-02-11 ENCOUNTER — Other Ambulatory Visit: Payer: Self-pay | Admitting: Gastroenterology

## 2012-02-11 NOTE — Telephone Encounter (Signed)
Told patient we do not prescribe this medication for him and to call his PCP or who ever prescribed the medication. Patient states he will call his PCP.

## 2012-02-15 ENCOUNTER — Other Ambulatory Visit: Payer: Self-pay | Admitting: *Deleted

## 2012-02-15 MED ORDER — CARVEDILOL 6.25 MG PO TABS: 18.7500 mg | ORAL_TABLET | Freq: Two times a day (BID) | ORAL | Status: DC

## 2012-02-23 ENCOUNTER — Encounter: Payer: Self-pay | Admitting: Internal Medicine

## 2012-02-23 ENCOUNTER — Ambulatory Visit (INDEPENDENT_AMBULATORY_CARE_PROVIDER_SITE_OTHER): Payer: Medicare Other | Admitting: Internal Medicine

## 2012-02-23 VITALS — BP 114/68 | HR 76 | Temp 97.8°F | Wt 182.1 lb

## 2012-02-23 DIAGNOSIS — I10 Essential (primary) hypertension: Secondary | ICD-10-CM | POA: Diagnosis not present

## 2012-02-23 DIAGNOSIS — M129 Arthropathy, unspecified: Secondary | ICD-10-CM

## 2012-02-23 DIAGNOSIS — E7800 Pure hypercholesterolemia, unspecified: Secondary | ICD-10-CM | POA: Diagnosis not present

## 2012-02-23 DIAGNOSIS — G459 Transient cerebral ischemic attack, unspecified: Secondary | ICD-10-CM

## 2012-02-23 NOTE — Progress Notes (Signed)
Subjective:    Patient ID: Jacob [name], male    DOB: 1932/05/23, 78 y.o.   MRN: [id number]  HPI  Here for follow up -  reviewed chronic med issues:  Diffuse OA - shoulders, knees and hips  S/p 12/2010 L TKR with ortho after cleared by cards with 11/05/10 cath -  ? R TKR  CHF/CAD - follows with [facility] cards for same - [facility]ishan/[facility]  reports compliance with ongoing medical treatment and no changes in medication dose or frequency.  denies adverse side effects related to current therapy. no chest pain, shortness of breath or edema -   HTN - reports compliance with ongoing medical treatment and no changes in medication dose or frequency. denies adverse side effects related to current therapy. no headache or vision changes   dyslipidemia - reports compliance with ongoing medical treatment and no changes in medication dose or frequency. denies adverse side effects related to current therapy.   BPH - hx urinary retention from BOO summer 2012 - follows with dr. Isabel Caprice -uro  -postop 01/2011 requiring indwelling cath, removed 03/2011 - intermittent abx for UTI   Past Medical History  Diagnosis Date  . Chronic systolic heart failure     EF 30-86% in past;  Echocardiogram 11/08/11: Normal LV wall thickness, EF 45-50%.  Marland Kitchen HTN (hypertension)   . History of diverticulitis of colon   . GERD (gastroesophageal reflux disease)   . History of nephrolithiasis   . Ischemic cardiomyopathy     s/p ICD 2008 (see CHF for EF)  . Urinary retention     foley indwelling 12/2010-04/2011  . Arthritis   . HLD (hyperlipidemia)   . Coronary artery disease     a. Chronically occ RCA. b. Last cardiac catheterization 10/2010: Mid LAD 20-30%, distal LAD 40%, mid D1 40%, mid circumflex 20-30%, distal RCA occluded with left to right collaterals.  . Benign prostatic hypertrophy   . TIA (transient ischemic attack) 10/2011    carotids neg for ICA stenosis  . Atrial fibrillation   . Skin cancer   . BPH (benign  prostatic hyperplasia)   . Renal insufficiency     Renal failure in July 2012 secondary to hypotension, ARB use, and   . Esophageal stricture   . Hemorrhoids     Review of Systems  Cardiovascular: Negative for chest pain and leg swelling.  Musculoskeletal: Positive for gait problem.  Neurological: Negative for weakness and headaches.       Objective:   Physical Exam  BP 114/68  Pulse 76  Temp 97.8 F (36.6 C) (Oral)  Wt 182 lb 1.9 oz (82.609 kg)  SpO2 97% Wt Readings from Last 3 Encounters:  02/23/12 182 lb 1.9 oz (82.609 kg)  01/10/12 186 lb (84.369 kg)  12/14/11 185 lb (83.915 kg)   Constitutional:  appears well-developed and well-nourished. No distress. Wife at side Neck: Normal range of motion. Neck supple. No JVD present. No thyromegaly present.  Cardiovascular: Normal rate, regular rhythm and normal heart sounds.  No murmur heard. no BLE edema Pulmonary/Chest: Effort normal and breath sounds normal. No respiratory distress. no wheezes. MSkel: right knee - boggy synovitis - tender to palpation over joint line; FROM and ligamentous function intact  Abdominal: Soft. Bowel sounds are normal. Patient exhibits no distension. There is no tenderness. Psychiatric: he has a normal mood and affect. behavior is normal. Judgment and thought content normal.      Lab Results  Component Value Date   WBC 8.5 12/04/2011  HGB 10.5* 12/04/2011   HCT 34.1* 12/04/2011   PLT 144* 12/04/2011   CHOL 119 11/08/2011   TRIG 127 11/08/2011   HDL 29* 11/08/2011   LDLDIRECT 54.3 08/26/2010   ALT 9 12/03/2011   AST 12 12/03/2011   NA 141 12/04/2011   K 4.0 12/04/2011   CL 106 12/04/2011   CREATININE 1.16 12/04/2011   BUN 14 12/04/2011   CO2 25 12/04/2011   TSH 2.522 12/03/2011   INR 1.57* 12/02/2011   HGBA1C 6.0* 12/03/2011     Assessment & Plan:  See problem list. Medications and labs reviewed today.  R knee osteoarthritis - refer to ortho for consideration of TKR - failing steroid injections  and pain mgmt - Given his comorbidities including ischemic cardiomyopathy, Pradaxia anti-coagulation, AICD and his recent TIAs,  he is at higher risk for surgical procedures - but risk felt to be outweighed by benefit of potential pain relief  Time spent with pt/family today 25 minutes, greater than 50% time spent counseling patient on risk/benefit of possible TKR and medication review. Also review of prior labs

## 2012-02-23 NOTE — Assessment & Plan Note (Signed)
The current medical regimen is effective;  continue present plan and medications. BP Readings from Last 3 Encounters:  02/23/12 114/68  01/10/12 140/60  12/14/11 129/49

## 2012-02-23 NOTE — Assessment & Plan Note (Signed)
Transient event - 12h in 10/2011 and again 11/2011 - resolved R facial droop and RUE weakness prior to ER presentation No MRI/MRA due to AICD S/p neuro eval - neg carotids Careful hx reveals he appears to be on chronic anticoag (pradaxa) prior to and during 10/2011 TIA event (known AF) Continue pradaxa and no asa per neuro

## 2012-02-23 NOTE — Patient Instructions (Signed)
It was good to see you today. We have reviewed your interval records including labs and tests today Medications reviewed and updated, no prescription changes recommended at this time. we'll make referral to Dr Charlann Boxer. Our office will contact you regarding appointment(s) once made. Please reschedule followup for 6 months; call sooner if problems.

## 2012-02-23 NOTE — Assessment & Plan Note (Signed)
On simva, tolerating well Reviewed last lipids - at goal The current medical regimen is effective;  continue present plan and medications.

## 2012-02-23 NOTE — Assessment & Plan Note (Signed)
Follows with [place] [facility] - s/p L TKR 01/07/11 with Dr. Charlann Boxer.  Refer now to consider R TKR Previously cleared by cards for L knee following L/R heart cath 11/04/10 In meanwhile, continue ongoing care - steroids and pain mgmt

## 2012-03-16 ENCOUNTER — Other Ambulatory Visit: Payer: Self-pay | Admitting: Cardiovascular Disease

## 2012-03-20 DIAGNOSIS — G459 Transient cerebral ischemic attack, unspecified: Secondary | ICD-10-CM | POA: Diagnosis not present

## 2012-04-11 ENCOUNTER — Other Ambulatory Visit: Payer: Self-pay | Admitting: Internal Medicine

## 2012-04-12 DIAGNOSIS — G4731 Primary central sleep apnea: Secondary | ICD-10-CM | POA: Diagnosis not present

## 2012-04-25 DIAGNOSIS — N3941 Urge incontinence: Secondary | ICD-10-CM | POA: Diagnosis not present

## 2012-04-25 DIAGNOSIS — N401 Enlarged prostate with lower urinary tract symptoms: Secondary | ICD-10-CM | POA: Diagnosis not present

## 2012-04-26 ENCOUNTER — Other Ambulatory Visit: Payer: Self-pay | Admitting: *Deleted

## 2012-04-26 MED ORDER — ISOSORBIDE MONONITRATE ER 30 MG PO TB24: 30.0000 mg | ORAL_TABLET | Freq: Every day | ORAL | Status: DC

## 2012-04-26 NOTE — Telephone Encounter (Signed)
R'cd fax from [facility] for refill of Isosorbide.

## 2012-05-12 ENCOUNTER — Encounter: Payer: Self-pay | Admitting: *Deleted

## 2012-05-15 ENCOUNTER — Encounter: Payer: Self-pay | Admitting: Internal Medicine

## 2012-05-15 ENCOUNTER — Ambulatory Visit (INDEPENDENT_AMBULATORY_CARE_PROVIDER_SITE_OTHER): Payer: Medicare Other | Admitting: *Deleted

## 2012-05-15 DIAGNOSIS — I4891 Unspecified atrial fibrillation: Secondary | ICD-10-CM

## 2012-05-15 LAB — ICD DEVICE OBSERVATION
AL IMPEDENCE ICD: 598 Ohm
AL THRESHOLD: 1 V
DEV-0020ICD: NEGATIVE
RV LEAD AMPLITUDE: 5.4 mv
RV LEAD IMPEDENCE ICD: 856 Ohm
RV LEAD THRESHOLD: 0.6 V
TZAT-0001FASTVT: 2
TZAT-0013FASTVT: 2
TZAT-0013FASTVT: 2
TZON-0003FASTVT: 333.3 ms
TZST-0001FASTVT: 4
TZST-0001FASTVT: 5
TZST-0003FASTVT: 26 J
TZST-0003FASTVT: 31 J
TZST-0003FASTVT: 41 J
VENTRICULAR PACING ICD: 0 pct

## 2012-05-15 NOTE — Progress Notes (Signed)
defib check in clinic  

## 2012-05-15 NOTE — Patient Instructions (Signed)
Return office visit with Dr Ladona Ridgel

## 2012-05-19 DIAGNOSIS — G894 Chronic pain syndrome: Secondary | ICD-10-CM | POA: Diagnosis not present

## 2012-05-19 DIAGNOSIS — M1710 Unilateral primary osteoarthritis, unspecified knee: Secondary | ICD-10-CM | POA: Diagnosis not present

## 2012-05-22 ENCOUNTER — Other Ambulatory Visit: Payer: Self-pay | Admitting: Internal Medicine

## 2012-05-26 ENCOUNTER — Telehealth: Payer: Self-pay | Admitting: Cardiovascular Disease

## 2012-05-26 ENCOUNTER — Encounter: Payer: Self-pay | Admitting: Cardiovascular Disease

## 2012-05-26 NOTE — Telephone Encounter (Signed)
New Problem:     I attempted to call the patient, which also happens to be both the listed cell phone and the emergency contact number of the patient's son, to schedule an appointment and received a message that the voicemail box was full.  I sent a letter.

## 2012-06-02 DIAGNOSIS — G4733 Obstructive sleep apnea (adult) (pediatric): Secondary | ICD-10-CM | POA: Diagnosis not present

## 2012-07-17 ENCOUNTER — Other Ambulatory Visit: Payer: Self-pay | Admitting: Internal Medicine

## 2012-07-17 NOTE — Telephone Encounter (Signed)
Med never been fill by md. Is this ok to refill?..lmb

## 2012-07-24 ENCOUNTER — Ambulatory Visit (INDEPENDENT_AMBULATORY_CARE_PROVIDER_SITE_OTHER): Payer: Medicare Other | Admitting: *Deleted

## 2012-07-24 DIAGNOSIS — Z23 Encounter for immunization: Secondary | ICD-10-CM

## 2012-08-15 ENCOUNTER — Ambulatory Visit (INDEPENDENT_AMBULATORY_CARE_PROVIDER_SITE_OTHER): Payer: Medicare Other | Admitting: Internal Medicine

## 2012-08-15 ENCOUNTER — Encounter: Payer: Self-pay | Admitting: Internal Medicine

## 2012-08-15 VITALS — BP 151/67 | HR 60 | Ht 67.0 in | Wt 186.8 lb

## 2012-08-15 DIAGNOSIS — Z95810 Presence of automatic (implantable) cardiac defibrillator: Secondary | ICD-10-CM | POA: Diagnosis not present

## 2012-08-15 DIAGNOSIS — I4891 Unspecified atrial fibrillation: Secondary | ICD-10-CM

## 2012-08-15 DIAGNOSIS — I2510 Atherosclerotic heart disease of native coronary artery without angina pectoris: Secondary | ICD-10-CM | POA: Diagnosis not present

## 2012-08-15 LAB — ICD DEVICE OBSERVATION
AL AMPLITUDE: 1.6 mv
ATRIAL PACING ICD: 17 pct
BATTERY VOLTAGE: 2.92 V
DEVICE MODEL ICD: 504434
HV IMPEDENCE: 38 Ohm
TZAT-0013FASTVT: 2
TZAT-0018FASTVT: NEGATIVE
TZST-0001FASTVT: 3
TZST-0001FASTVT: 4
TZST-0001FASTVT: 7
TZST-0003FASTVT: 41 J
TZST-0003FASTVT: 41 J
VENTRICULAR PACING ICD: 0 pct

## 2012-08-15 NOTE — Assessment & Plan Note (Signed)
His [employer] ICD is working normally. We'll plan recheck in several months.

## 2012-08-15 NOTE — Progress Notes (Signed)
HPI Jacob [name] returns today for followup. He is a very pleasant 79 year old man with an ischemic cardiomyopathy, chronic systolic heart failure, class II, ventricular tachycardia, and atrial fibrillation, status post ICD implantation. The patient has been well-controlled from his atrial fibrillation on medical therapy and he denies chest pain, or shortness of breath. No peripheral edema. Allergies  Allergen Reactions  . Penicillins Itching, Swelling and Rash     Current Outpatient Prescriptions  Medication Sig Dispense Refill  . aspirin 81 MG tablet Take 81 mg by mouth daily.      . carvedilol (COREG) 6.25 MG tablet TAKE 3 TABLETS BY MOUTH TWICE DAILY WITH A MEAL  180 tablet  3  . cyanocobalamin 100 MCG tablet Take 100 mcg by mouth daily.       Marland Kitchen dutasteride (AVODART) 0.5 MG capsule Take 0.5 mg by mouth every morning.       . fluticasone (FLONASE) 50 MCG/ACT nasal spray INSTILL 2 SPRAYS IN EACH NOSTRIL EVERY DAY FOR ALLERGIES  16 g  3  . HYDROcodone-acetaminophen (NORCO) 5-325 MG per tablet Take 1 tablet by mouth every 6 (six) hours as needed. Pain       . isosorbide mononitrate (IMDUR) 30 MG 24 hr tablet Take 1 tablet (30 mg total) by mouth daily.  30 tablet  4  . lisinopril (PRINIVIL,ZESTRIL) 2.5 MG tablet Take 1 tablet (2.5 mg total) by mouth daily.  30 tablet  0  . NITROSTAT 0.4 MG SL tablet Place 0.4 mg under the tongue every 5 (five) minutes as needed. For chest pain      . omeprazole (PRILOSEC) 40 MG capsule Take 1 capsule (40 mg total) by mouth daily.  30 capsule  11  . PRADAXA 150 MG CAPS TAKE ONE CAPSULE BY MOUTH TWICE DAILY  60 capsule  0  . simvastatin (ZOCOR) 40 MG tablet 40 mg every morning.       . solifenacin (VESICARE) 5 MG tablet Take 10 mg by mouth daily.       No current facility-administered medications for this visit.     Past Medical History  Diagnosis Date  . Chronic systolic heart failure     EF 01-02% in past;  Echocardiogram 11/08/11: Normal LV wall  thickness, EF 45-50%.  Marland Kitchen HTN (hypertension)   . History of diverticulitis of colon   . GERD (gastroesophageal reflux disease)   . History of nephrolithiasis   . Ischemic cardiomyopathy     s/p ICD 2008 (see CHF for EF)  . Urinary retention     foley indwelling 12/2010-04/2011  . Arthritis   . HLD (hyperlipidemia)   . Coronary artery disease     a. Chronically occ RCA. b. Last cardiac catheterization 10/2010: Mid LAD 20-30%, distal LAD 40%, mid D1 40%, mid circumflex 20-30%, distal RCA occluded with left to right collaterals.  . Benign prostatic hypertrophy   . TIA (transient ischemic attack) 10/2011    carotids neg for ICA stenosis  . Atrial fibrillation   . Skin cancer   . BPH (benign prostatic hyperplasia)   . Renal insufficiency     Renal failure in July 2012 secondary to hypotension, ARB use, and   . Esophageal stricture   . Hemorrhoids     ROS:   All systems reviewed and negative except as noted in the HPI.   Past Surgical History  Procedure Laterality Date  . Cardiac defibrillator placement  12/2006    AICD  . Total knee arthroplasty  01/04/11  L TKA  . Cardiac catheterization  11-05-10  . Joint replacement    . Cataracts removed    . Transurethral resection of prostate  06/14/2011    Procedure: TRANSURETHRAL RESECTION OF THE PROSTATE (TURP);  Surgeon: Valetta Fuller, MD;  Location: [facility];  Service: Urology;  Laterality: N/A;     Family History  Problem Relation Age of Onset  . Heart disease Son     x 2  . Colon cancer Neg Hx      History   Social History  . Marital Status: Married    Spouse Name: N/A    Number of Children: N/A  . Years of Education: N/A   Occupational History  . retired    Social History Main Topics  . Smoking status: Former Smoker -- 2.00 packs/day for 30 years    Quit date: 06/14/1968  . Smokeless tobacco: Never Used  . Alcohol Use: No  . Drug Use: No  . Sexually Active: Not on file   Other Topics Concern  . Not on file    Social History Narrative   Daily caffeine      BP 151/67  Pulse 60  Ht 5\' 7"  (1.702 m)  Wt 186 lb 12.8 oz (84.732 kg)  BMI 29.25 kg/m2  Physical Exam:  Well appearing 79 year old man, NAD HEENT: Unremarkable Neck:  7 cm JVD, no thyromegally Lungs:  Clear except for scattered basilar rales, no wheezes, no rhonchi. HEART:  IRegular rate rhythm, no murmurs, no rubs, no clicks Abd:  soft, positive bowel sounds, no organomegally, no rebound, no guarding Ext:  2 plus pulses, no edema, no cyanosis, no clubbing Skin:  No rashes no nodules Neuro:  CN II through XII intact, motor grossly intact  EKG - normal sinus rhythm with atrial pacing and occasional PVCs in  DEVICE  Normal device function.  See PaceArt for details.   Assess/Plan:

## 2012-08-15 NOTE — Assessment & Plan Note (Signed)
He is maintaining sinus rhythm. No change in medical therapy. 

## 2012-08-15 NOTE — Patient Instructions (Addendum)
Your physician recommends that you schedule a follow-up appointment in: 3 months with device clinic and 12 months with Dr Taylor  

## 2012-08-15 NOTE — Assessment & Plan Note (Signed)
He denies anginal symptoms. I've encouraged the patient to increase his physical activity. No change in medical therapy.

## 2012-09-01 ENCOUNTER — Other Ambulatory Visit: Payer: Self-pay | Admitting: Internal Medicine

## 2012-09-05 ENCOUNTER — Encounter: Payer: Self-pay | Admitting: Neurology

## 2012-09-05 ENCOUNTER — Ambulatory Visit: Payer: Self-pay | Admitting: Neurology

## 2012-09-06 ENCOUNTER — Other Ambulatory Visit: Payer: Self-pay | Admitting: *Deleted

## 2012-09-06 MED ORDER — CARVEDILOL 6.25 MG PO TABS: ORAL_TABLET | ORAL | Status: DC

## 2012-09-26 IMAGING — CR DG CHEST 2V
2 series · 2 of 2 positions shown · non-contrast
Comparison: 12/17/2008

CLINICAL DATA: Preoperative respiratory exam for total knee
arthroplasty.

CHEST - 2 VIEW

[w chest pa]
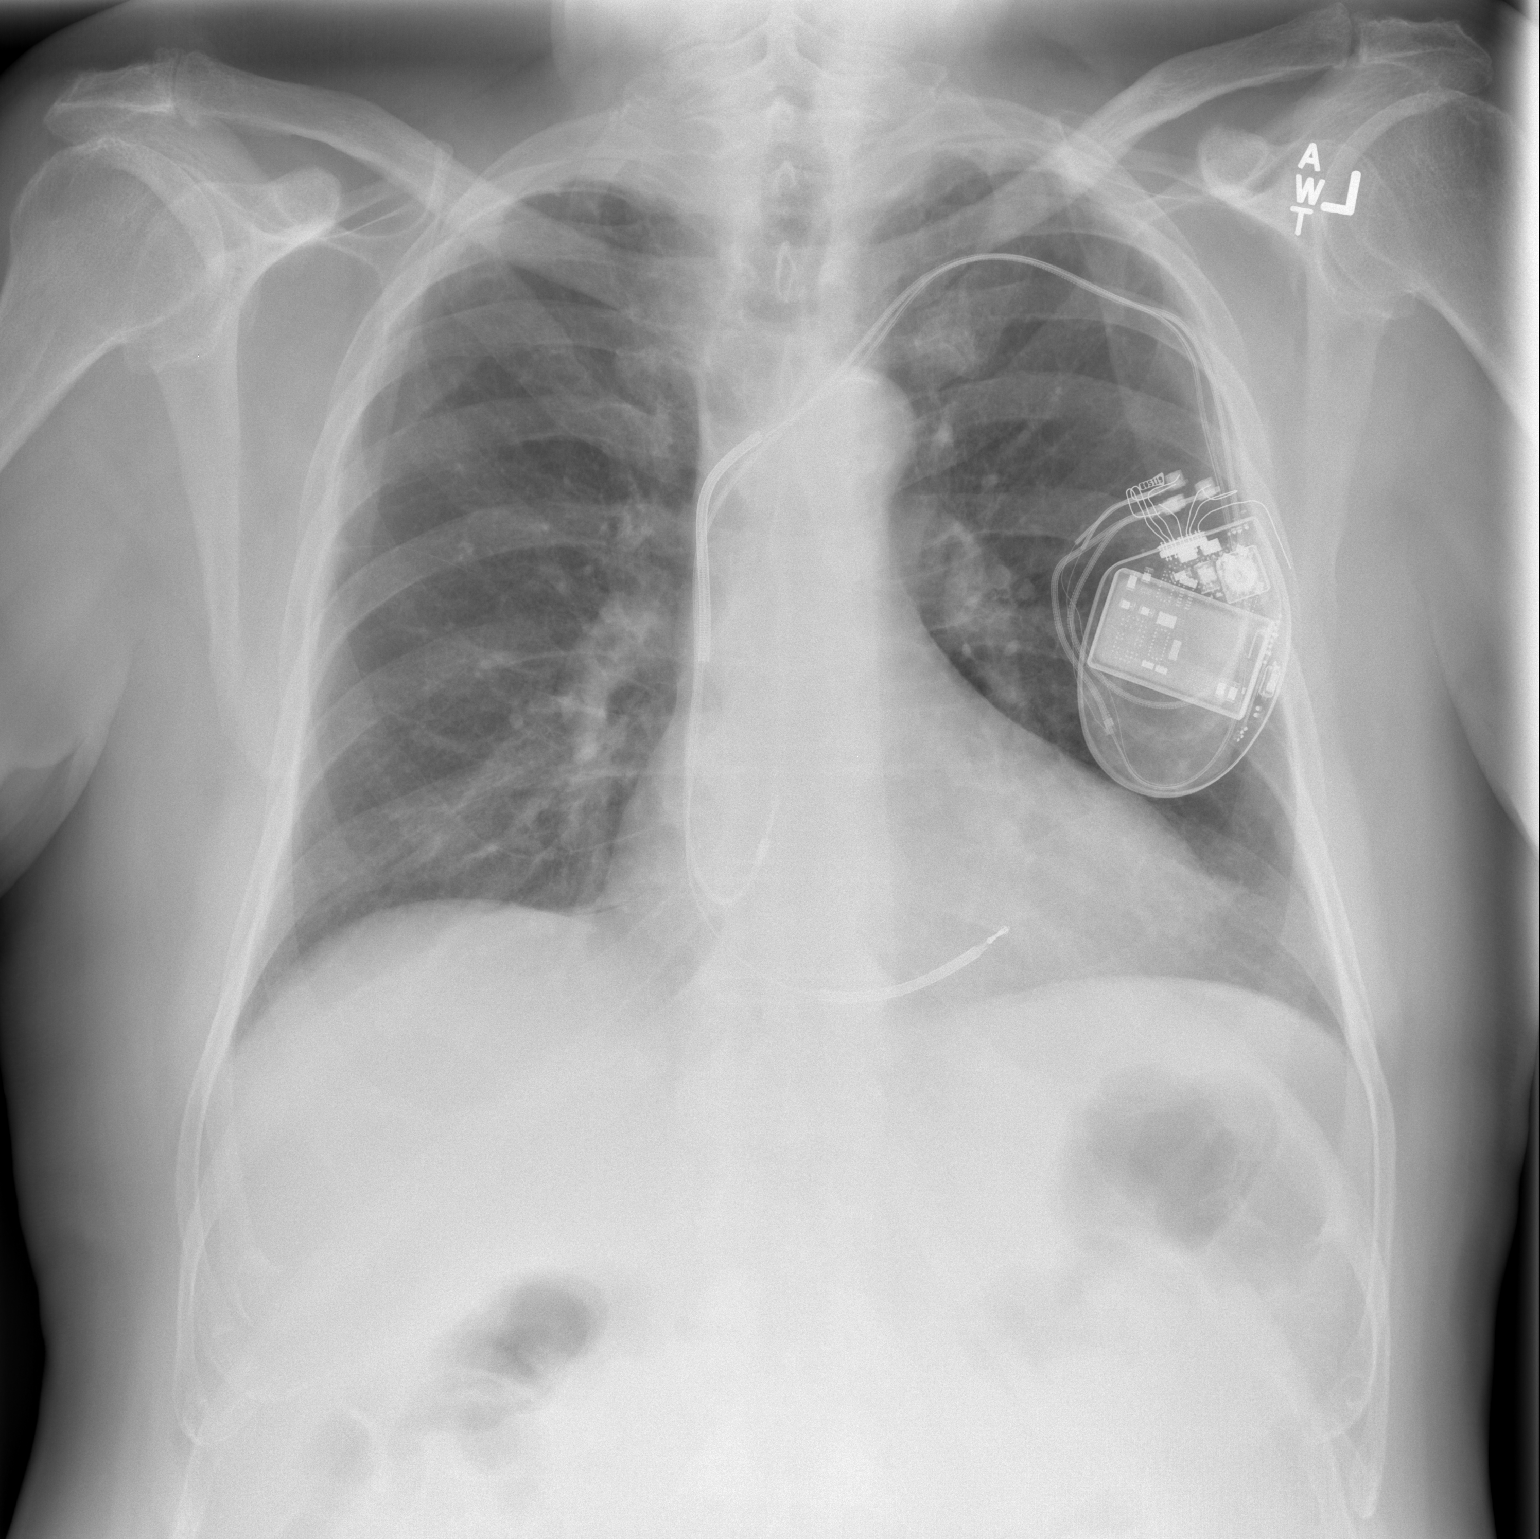

[w chest lat]
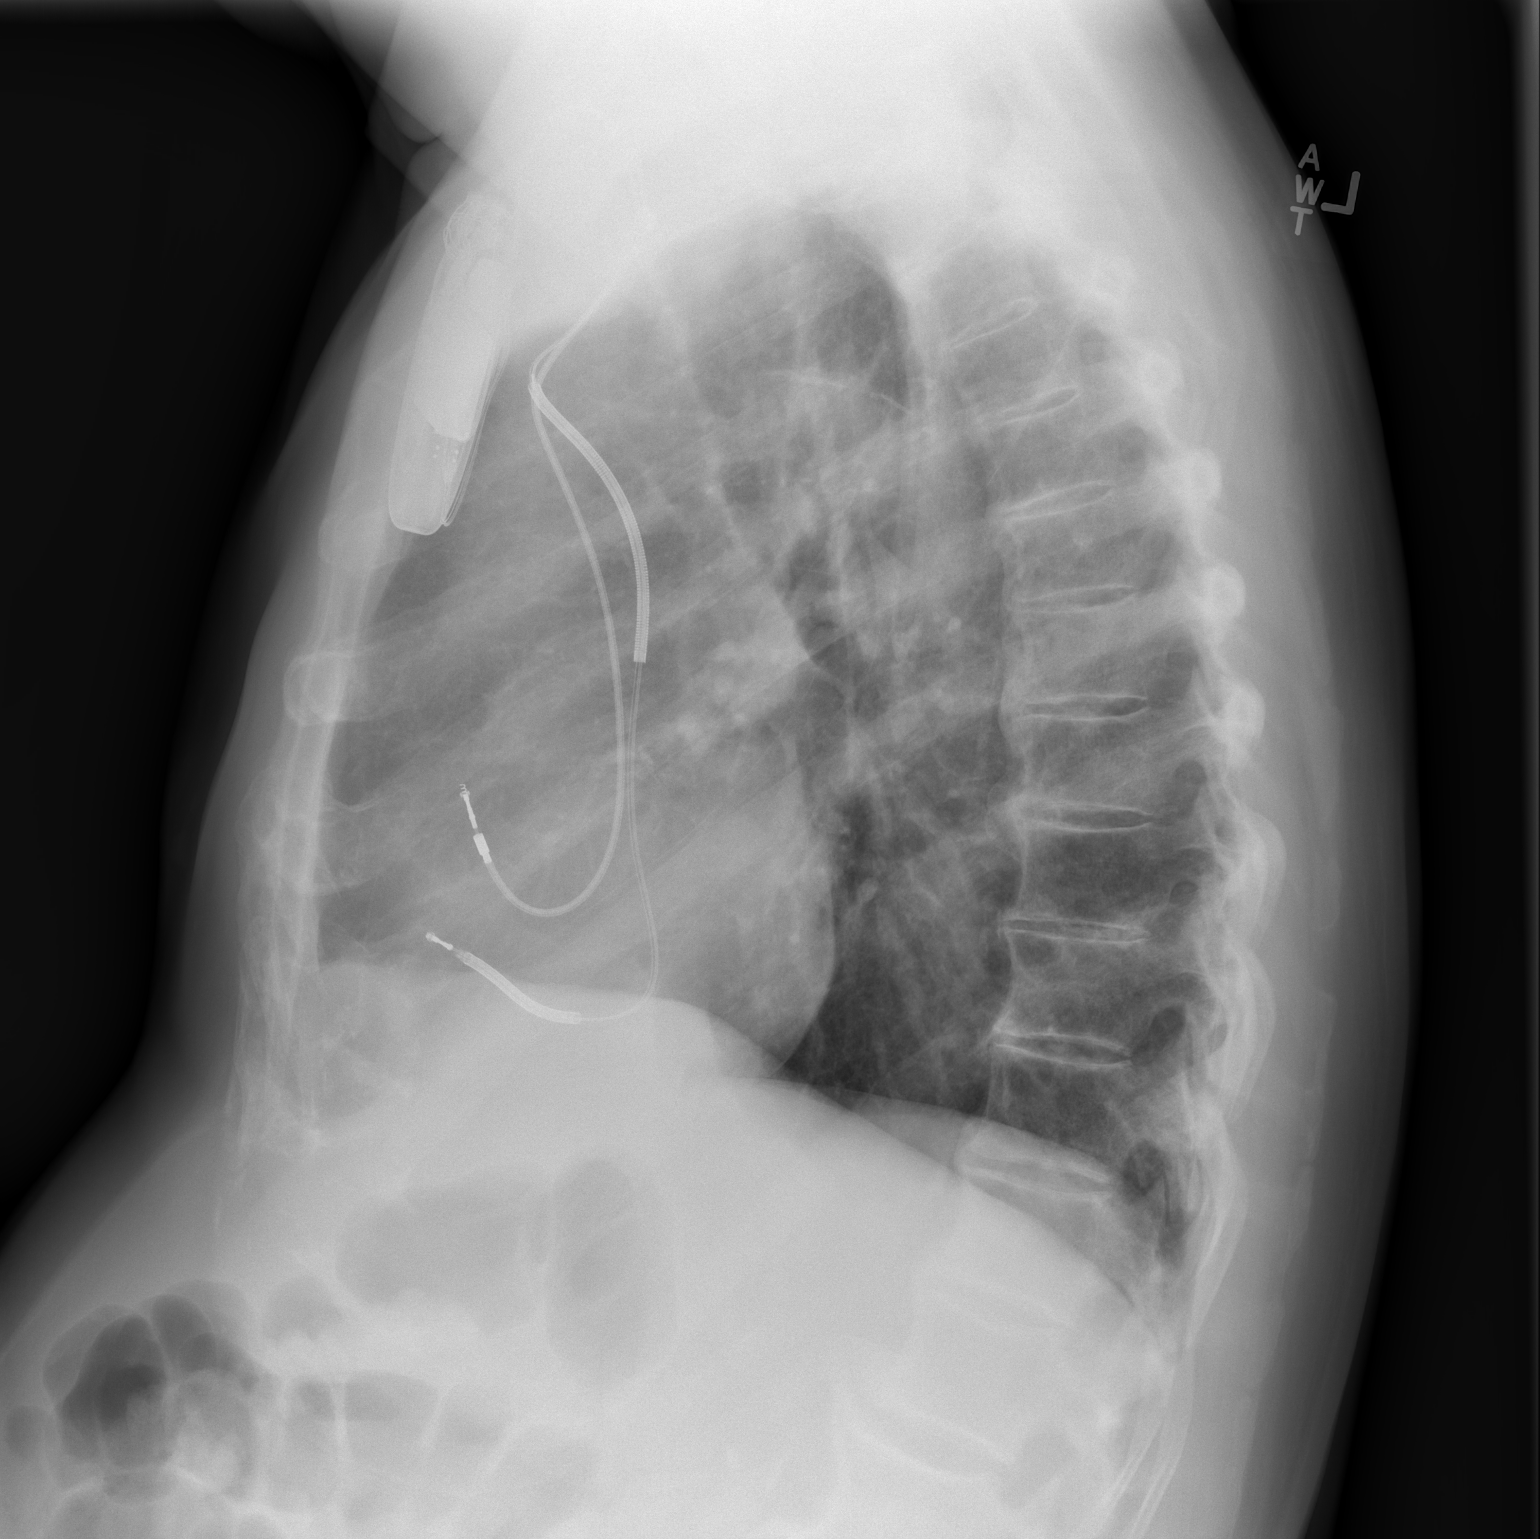

[2 of 2 positions shown; findings below may reference images not displayed]

FINDINGS: Heart size is at the upper limits of normal.
Pacemaker/AICD remains in place.  Mediastinal shadows are otherwise
normal.  Lungs are clear.  The vascularity is normal.  There are
chronic degenerative changes of the spine.
IMPRESSION: No active cardiopulmonary disease.  Pacemaker/AICD in place.

## 2012-11-07 DIAGNOSIS — Z79899 Other long term (current) drug therapy: Secondary | ICD-10-CM | POA: Diagnosis not present

## 2012-11-07 DIAGNOSIS — M1710 Unilateral primary osteoarthritis, unspecified knee: Secondary | ICD-10-CM | POA: Diagnosis not present

## 2012-11-07 DIAGNOSIS — G894 Chronic pain syndrome: Secondary | ICD-10-CM | POA: Diagnosis not present

## 2012-11-14 ENCOUNTER — Other Ambulatory Visit: Payer: Self-pay | Admitting: Internal Medicine

## 2012-11-20 ENCOUNTER — Ambulatory Visit (INDEPENDENT_AMBULATORY_CARE_PROVIDER_SITE_OTHER): Payer: Medicare Other | Admitting: *Deleted

## 2012-11-20 DIAGNOSIS — I428 Other cardiomyopathies: Secondary | ICD-10-CM | POA: Diagnosis not present

## 2012-11-20 DIAGNOSIS — I509 Heart failure, unspecified: Secondary | ICD-10-CM

## 2012-11-20 LAB — ICD DEVICE OBSERVATION
AL IMPEDENCE ICD: 598 Ohm
AL THRESHOLD: 1 V
RV LEAD IMPEDENCE ICD: 919 Ohm
RV LEAD THRESHOLD: 0.6 V
TZAT-0001FASTVT: 1
TZAT-0013FASTVT: 2
TZAT-0018FASTVT: NEGATIVE
TZON-0003FASTVT: 333.3 ms
TZST-0001FASTVT: 5
TZST-0001FASTVT: 6
VENTRICULAR PACING ICD: 0 pct

## 2012-11-20 NOTE — Progress Notes (Signed)
ICD check in office. 

## 2012-11-22 ENCOUNTER — Other Ambulatory Visit: Payer: Self-pay | Admitting: *Deleted

## 2012-11-22 MED ORDER — ISOSORBIDE MONONITRATE ER 30 MG PO TB24: 30.0000 mg | ORAL_TABLET | Freq: Every day | ORAL | Status: DC

## 2012-11-22 NOTE — Telephone Encounter (Signed)
R'cd fax from [facility] for refill of Isosorbide.

## 2013-01-04 ENCOUNTER — Encounter: Payer: Self-pay | Admitting: Internal Medicine

## 2013-02-16 ENCOUNTER — Telehealth: Payer: Self-pay | Admitting: Gastroenterology

## 2013-02-16 MED ORDER — OMEPRAZOLE 40 MG PO CPDR: 40.0000 mg | DELAYED_RELEASE_CAPSULE | Freq: Every day | ORAL | Status: DC

## 2013-02-16 NOTE — Telephone Encounter (Signed)
Sent one refill to patient's pharmacy until he comes for appt on 02/28/13.

## 2013-02-19 ENCOUNTER — Ambulatory Visit (INDEPENDENT_AMBULATORY_CARE_PROVIDER_SITE_OTHER): Payer: Medicare Other | Admitting: *Deleted

## 2013-02-19 DIAGNOSIS — I509 Heart failure, unspecified: Secondary | ICD-10-CM

## 2013-02-19 LAB — ICD DEVICE OBSERVATION
AL THRESHOLD: 1 V
ATRIAL PACING ICD: 33 pct
DEVICE MODEL ICD: 504434
RV LEAD AMPLITUDE: 5 mv
TZAT-0001FASTVT: 2
TZAT-0013FASTVT: 2
TZAT-0018FASTVT: NEGATIVE
TZST-0001FASTVT: 4
TZST-0001FASTVT: 5
TZST-0001FASTVT: 7
TZST-0003FASTVT: 26 J
TZST-0003FASTVT: 31 J
TZST-0003FASTVT: 41 J
VENTRICULAR PACING ICD: 0 pct

## 2013-02-19 NOTE — Progress Notes (Signed)
ICD check in office. 

## 2013-02-22 ENCOUNTER — Emergency Department (HOSPITAL_COMMUNITY): Payer: Medicare Other

## 2013-02-22 ENCOUNTER — Encounter (HOSPITAL_COMMUNITY): Payer: Self-pay | Admitting: Emergency Medicine

## 2013-02-22 ENCOUNTER — Observation Stay (HOSPITAL_COMMUNITY)
Admission: EM | Admit: 2013-02-22 | Discharge: 2013-02-24 | Disposition: A | Discharge status: Home / Self Care | Payer: Medicare Other | Attending: Internal Medicine | Admitting: Internal Medicine

## 2013-02-22 DIAGNOSIS — I4891 Unspecified atrial fibrillation: Secondary | ICD-10-CM

## 2013-02-22 DIAGNOSIS — I429 Cardiomyopathy, unspecified: Secondary | ICD-10-CM | POA: Diagnosis present

## 2013-02-22 DIAGNOSIS — K219 Gastro-esophageal reflux disease without esophagitis: Secondary | ICD-10-CM

## 2013-02-22 DIAGNOSIS — I5022 Chronic systolic (congestive) heart failure: Secondary | ICD-10-CM | POA: Insufficient documentation

## 2013-02-22 DIAGNOSIS — Z8679 Personal history of other diseases of the circulatory system: Secondary | ICD-10-CM

## 2013-02-22 DIAGNOSIS — Z79899 Other long term (current) drug therapy: Secondary | ICD-10-CM | POA: Insufficient documentation

## 2013-02-22 DIAGNOSIS — N401 Enlarged prostate with lower urinary tract symptoms: Secondary | ICD-10-CM

## 2013-02-22 DIAGNOSIS — I428 Other cardiomyopathies: Secondary | ICD-10-CM

## 2013-02-22 DIAGNOSIS — G459 Transient cerebral ischemic attack, unspecified: Secondary | ICD-10-CM

## 2013-02-22 DIAGNOSIS — Z95810 Presence of automatic (implantable) cardiac defibrillator: Secondary | ICD-10-CM

## 2013-02-22 DIAGNOSIS — I2589 Other forms of chronic ischemic heart disease: Secondary | ICD-10-CM | POA: Insufficient documentation

## 2013-02-22 DIAGNOSIS — R55 Syncope and collapse: Principal | ICD-10-CM

## 2013-02-22 DIAGNOSIS — Z87438 Personal history of other diseases of male genital organs: Secondary | ICD-10-CM

## 2013-02-22 DIAGNOSIS — E785 Hyperlipidemia, unspecified: Secondary | ICD-10-CM | POA: Insufficient documentation

## 2013-02-22 DIAGNOSIS — Z8673 Personal history of transient ischemic attack (TIA), and cerebral infarction without residual deficits: Secondary | ICD-10-CM | POA: Insufficient documentation

## 2013-02-22 DIAGNOSIS — D239 Other benign neoplasm of skin, unspecified: Secondary | ICD-10-CM

## 2013-02-22 DIAGNOSIS — M67919 Unspecified disorder of synovium and tendon, unspecified shoulder: Secondary | ICD-10-CM

## 2013-02-22 DIAGNOSIS — L259 Unspecified contact dermatitis, unspecified cause: Secondary | ICD-10-CM

## 2013-02-22 DIAGNOSIS — M1710 Unilateral primary osteoarthritis, unspecified knee: Secondary | ICD-10-CM | POA: Diagnosis not present

## 2013-02-22 DIAGNOSIS — I10 Essential (primary) hypertension: Secondary | ICD-10-CM | POA: Diagnosis not present

## 2013-02-22 DIAGNOSIS — I509 Heart failure, unspecified: Secondary | ICD-10-CM

## 2013-02-22 DIAGNOSIS — I2510 Atherosclerotic heart disease of native coronary artery without angina pectoris: Secondary | ICD-10-CM

## 2013-02-22 DIAGNOSIS — N400 Enlarged prostate without lower urinary tract symptoms: Secondary | ICD-10-CM | POA: Diagnosis not present

## 2013-02-22 DIAGNOSIS — M129 Arthropathy, unspecified: Secondary | ICD-10-CM

## 2013-02-22 DIAGNOSIS — E7800 Pure hypercholesterolemia, unspecified: Secondary | ICD-10-CM

## 2013-02-22 DIAGNOSIS — R339 Retention of urine, unspecified: Secondary | ICD-10-CM

## 2013-02-22 DIAGNOSIS — R5381 Other malaise: Secondary | ICD-10-CM | POA: Diagnosis not present

## 2013-02-22 DIAGNOSIS — Z7901 Long term (current) use of anticoagulants: Secondary | ICD-10-CM | POA: Diagnosis not present

## 2013-02-22 DIAGNOSIS — M179 Osteoarthritis of knee, unspecified: Secondary | ICD-10-CM

## 2013-02-22 DIAGNOSIS — R404 Transient alteration of awareness: Secondary | ICD-10-CM | POA: Diagnosis not present

## 2013-02-22 DIAGNOSIS — Z01810 Encounter for preprocedural cardiovascular examination: Secondary | ICD-10-CM

## 2013-02-22 DIAGNOSIS — R0989 Other specified symptoms and signs involving the circulatory and respiratory systems: Secondary | ICD-10-CM | POA: Diagnosis not present

## 2013-02-22 LAB — COMPREHENSIVE METABOLIC PANEL
ALT: 6 U/L (ref 0–53)
AST: 13 U/L (ref 0–37)
Alkaline Phosphatase: 74 U/L (ref 39–117)
CO2: 26 mEq/L (ref 19–32)
Chloride: 105 mEq/L (ref 96–112)
GFR calc Af Amer: 58 mL/min — ABNORMAL LOW (ref >90)
GFR calc non Af Amer: 50 mL/min — ABNORMAL LOW (ref >90)
Glucose, Bld: 116 mg/dL — ABNORMAL HIGH (ref 70–99)
Potassium: 3.9 mEq/L (ref 3.5–5.1)
Sodium: 140 mEq/L (ref 135–145)

## 2013-02-22 LAB — CBC WITH DIFFERENTIAL/PLATELET
Basophils Absolute: 0 10*3/uL (ref 0.0–0.1)
Lymphocytes Relative: 20 % (ref 12–46)
Lymphs Abs: 1.5 10*3/uL (ref 0.7–4.0)
MCV: 86.4 fL (ref 78.0–100.0)
Neutro Abs: 5.1 10*3/uL (ref 1.7–7.7)
Neutrophils Relative %: 69 % (ref 43–77)
Platelets: 141 10*3/uL — ABNORMAL LOW (ref 150–400)
RBC: 4.11 MIL/uL — ABNORMAL LOW (ref 4.22–5.81)
WBC: 7.4 10*3/uL (ref 4.0–10.5)

## 2013-02-22 MED ORDER — ONDANSETRON HCL 4 MG PO TABS: 4.0000 mg | ORAL_TABLET | Freq: Four times a day (QID) | ORAL | Status: DC | PRN

## 2013-02-22 MED ORDER — HYDROCODONE-ACETAMINOPHEN 5-325 MG PO TABS: 1.0000 | ORAL_TABLET | Freq: Four times a day (QID) | ORAL | Status: DC | PRN

## 2013-02-22 MED ORDER — SODIUM CHLORIDE 0.9 % IJ SOLN
3.0000 mL | Freq: Two times a day (BID) | INTRAMUSCULAR | Status: DC
  Administered 2013-02-22 – 2013-02-23 (×2): 3 mL via INTRAVENOUS

## 2013-02-22 MED ORDER — SIMVASTATIN 40 MG PO TABS
40.0000 mg | ORAL_TABLET | Freq: Every day | ORAL | Status: DC
  Filled 2013-02-22: qty 1

## 2013-02-22 MED ORDER — ASPIRIN EC 81 MG PO TBEC
81.0000 mg | DELAYED_RELEASE_TABLET | Freq: Every day | ORAL | Status: DC
  Administered 2013-02-23 – 2013-02-24 (×2): 81 mg via ORAL
  Filled 2013-02-22 (×3): qty 1

## 2013-02-22 MED ORDER — TAMSULOSIN HCL 0.4 MG PO CAPS: 0.4000 mg | ORAL_CAPSULE | Freq: Every day | ORAL | Status: DC

## 2013-02-22 MED ORDER — SODIUM CHLORIDE 0.9 % IV SOLN: 250.0000 mL | INTRAVENOUS | Status: DC | PRN

## 2013-02-22 MED ORDER — ISOSORBIDE MONONITRATE ER 30 MG PO TB24
30.0000 mg | ORAL_TABLET | Freq: Every day | ORAL | Status: DC
  Administered 2013-02-23 – 2013-02-24 (×2): 30 mg via ORAL
  Filled 2013-02-22 (×3): qty 1

## 2013-02-22 MED ORDER — SODIUM CHLORIDE 0.9 % IJ SOLN
3.0000 mL | Freq: Two times a day (BID) | INTRAMUSCULAR | Status: DC
  Administered 2013-02-24 (×2): 3 mL via INTRAVENOUS

## 2013-02-22 MED ORDER — ONDANSETRON HCL 4 MG/2ML IJ SOLN: 4.0000 mg | Freq: Four times a day (QID) | INTRAMUSCULAR | Status: DC | PRN

## 2013-02-22 MED ORDER — DABIGATRAN ETEXILATE MESYLATE 150 MG PO CAPS
150.0000 mg | ORAL_CAPSULE | Freq: Two times a day (BID) | ORAL | Status: DC
  Administered 2013-02-22 – 2013-02-24 (×4): 150 mg via ORAL
  Filled 2013-02-22 (×5): qty 1

## 2013-02-22 MED ORDER — CARVEDILOL 12.5 MG PO TABS
18.6900 mg | ORAL_TABLET | Freq: Two times a day (BID) | ORAL | Status: DC
  Administered 2013-02-23 – 2013-02-24 (×3): 18.75 mg via ORAL
  Filled 2013-02-22 (×5): qty 1

## 2013-02-22 MED ORDER — SODIUM CHLORIDE 0.9 % IV SOLN
1000.0000 mL | INTRAVENOUS | Status: DC
  Administered 2013-02-22: 1000 mL via INTRAVENOUS

## 2013-02-22 MED ORDER — DUTASTERIDE 0.5 MG PO CAPS
0.5000 mg | ORAL_CAPSULE | Freq: Every day | ORAL | Status: DC
  Administered 2013-02-23 – 2013-02-24 (×2): 0.5 mg via ORAL
  Filled 2013-02-22 (×2): qty 1

## 2013-02-22 MED ORDER — PANTOPRAZOLE SODIUM 40 MG PO TBEC
40.0000 mg | DELAYED_RELEASE_TABLET | Freq: Every day | ORAL | Status: DC
  Administered 2013-02-23 – 2013-02-24 (×2): 40 mg via ORAL
  Filled 2013-02-22 (×2): qty 1

## 2013-02-22 MED ORDER — PANTOPRAZOLE SODIUM 40 MG PO TBEC: 40.0000 mg | DELAYED_RELEASE_TABLET | Freq: Every day | ORAL | Status: DC

## 2013-02-22 MED ORDER — TAMSULOSIN HCL 0.4 MG PO CAPS
0.4000 mg | ORAL_CAPSULE | Freq: Every day | ORAL | Status: DC
  Administered 2013-02-23 – 2013-02-24 (×2): 0.4 mg via ORAL
  Filled 2013-02-22 (×2): qty 1

## 2013-02-22 MED ORDER — NITROGLYCERIN 0.4 MG SL SUBL
0.4000 mg | SUBLINGUAL_TABLET | SUBLINGUAL | Status: DC | PRN
Start: 2013-02-22 — End: 2013-02-24

## 2013-02-22 MED ORDER — MORPHINE SULFATE 2 MG/ML IJ SOLN: 2.0000 mg | INTRAMUSCULAR | Status: DC | PRN

## 2013-02-22 MED ORDER — SIMVASTATIN 40 MG PO TABS
40.0000 mg | ORAL_TABLET | Freq: Every day | ORAL | Status: DC
  Administered 2013-02-24: 01:00:00 40 mg via ORAL
  Filled 2013-02-22 (×2): qty 1

## 2013-02-22 MED ORDER — FLUTICASONE PROPIONATE 50 MCG/ACT NA SUSP
2.0000 | Freq: Every day | NASAL | Status: DC
  Administered 2013-02-23 – 2013-02-24 (×2): 2 via NASAL
  Filled 2013-02-22: qty 16

## 2013-02-22 MED ORDER — SODIUM CHLORIDE 0.9 % IJ SOLN: 3.0000 mL | INTRAMUSCULAR | Status: DC | PRN

## 2013-02-22 MED ORDER — NITROFURANTOIN MONOHYD MACRO 100 MG PO CAPS
100.0000 mg | ORAL_CAPSULE | Freq: Every day | ORAL | Status: DC
  Administered 2013-02-23 – 2013-02-24 (×2): 100 mg via ORAL
  Filled 2013-02-22 (×2): qty 1

## 2013-02-22 MED ORDER — DARIFENACIN HYDROBROMIDE ER 7.5 MG PO TB24
7.5000 mg | ORAL_TABLET | Freq: Every day | ORAL | Status: DC
  Administered 2013-02-23 – 2013-02-24 (×2): 7.5 mg via ORAL
  Filled 2013-02-22 (×3): qty 1

## 2013-02-22 MED ORDER — VITAMIN B-12 100 MCG PO TABS
100.0000 ug | ORAL_TABLET | Freq: Every day | ORAL | Status: DC
  Administered 2013-02-23 – 2013-02-24 (×2): 100 ug via ORAL
  Filled 2013-02-22 (×3): qty 1

## 2013-02-22 MED ORDER — HYDROCODONE-ACETAMINOPHEN 5-325 MG PO TABS
1.0000 | ORAL_TABLET | Freq: Once | ORAL | Status: AC
  Administered 2013-02-22: 1 via ORAL
  Filled 2013-02-22: qty 1

## 2013-02-22 NOTE — ED Notes (Signed)
Report attempted x 1

## 2013-02-22 NOTE — Consult Note (Addendum)
Reason for Consult:Syncope Referring Physician: Oti  CC: Syncope and possible seizure  HPI: Jacob [name] is an 79 y.o. male who reports that for the past 2 weeks has been having intermittent dizziness.  Reports that the dizziness improves if he lies down.  Today while snapping string beans began to feel dizzy.  Patient then became diaphoretic.  He does not describe chest pain.  Patient remembers nothing after that other than coming to with people all around.  Per reports of his daughter he became suddenly rigid, with knife clenched in his fist, and the left side of his face drawn up "as if he was in pain", then his eyes closed. He remained "stiff" with it requiring quite a bit of effort to get the knife out of his hand.  The episode lasted about 5 minutes.  The patient was confused afterward.  EMS was called and the patient was brought to the ED for evaluation.  Patient has resolved completely on arrival.     Past Medical History  Diagnosis Date  . Chronic systolic heart failure     EF 78-29% in past;  Echocardiogram 11/08/11: Normal LV wall thickness, EF 45-50%.  Marland Kitchen HTN (hypertension)   . History of diverticulitis of colon   . GERD (gastroesophageal reflux disease)   . History of nephrolithiasis   . Ischemic cardiomyopathy     s/p ICD 2008 (see CHF for EF)  . Urinary retention     foley indwelling 12/2010-04/2011  . Arthritis   . HLD (hyperlipidemia)   . Coronary artery disease     a. Chronically occ RCA. b. Last cardiac catheterization 10/2010: Mid LAD 20-30%, distal LAD 40%, mid D1 40%, mid circumflex 20-30%, distal RCA occluded with left to right collaterals.  . Benign prostatic hypertrophy   . TIA (transient ischemic attack) 10/2011    carotids neg for ICA stenosis  . Atrial fibrillation   . Skin cancer   . BPH (benign prostatic hyperplasia)   . Renal insufficiency     Renal failure in July 2012 secondary to hypotension, ARB use, and   . Esophageal stricture   . Hemorrhoids      Past Surgical History  Procedure Laterality Date  . Cardiac defibrillator placement  12/2006    AICD  . Total knee arthroplasty  01/04/11    L TKA  . Cardiac catheterization  11-05-10  . Joint replacement    . Cataracts removed    . Transurethral resection of prostate  06/14/2011    Procedure: TRANSURETHRAL RESECTION OF THE PROSTATE (TURP);  Surgeon: Valetta Fuller, MD;  Location: [facility];  Service: Urology;  Laterality: N/A;    Family History  Problem Relation Age of Onset  . Heart disease Son     x 2  . Colon cancer Neg Hx     Social History:  reports that he quit smoking about 44 years ago. He has never used smokeless tobacco. He reports that he does not drink alcohol or use illicit drugs.  Allergies  Allergen Reactions  . Penicillins Itching, Swelling and Rash    Medications:  I have reviewed the patient's current medications. Prior to Admission:  Prescriptions prior to admission  Medication Sig Dispense Refill  . aspirin 81 MG tablet Take 81 mg by mouth daily.      . carvedilol (COREG) 6.25 MG tablet Take 18.69 mg by mouth 2 (two) times daily with a meal.      . cyanocobalamin 100 MCG tablet Take 100  mcg by mouth daily.       . dabigatran (PRADAXA) 150 MG CAPS capsule Take 150 mg by mouth every 12 (twelve) hours.      . dutasteride (AVODART) 0.5 MG capsule Take 0.5 mg by mouth every morning.       . fluticasone (FLONASE) 50 MCG/ACT nasal spray Place 2 sprays into the nose daily.      . furosemide (LASIX) 20 MG tablet Take 20 mg by mouth.      Marland Kitchen HYDROcodone-acetaminophen (NORCO) 5-325 MG per tablet Take 1 tablet by mouth every 6 (six) hours as needed. Pain       . isosorbide mononitrate (IMDUR) 30 MG 24 hr tablet Take 1 tablet (30 mg total) by mouth daily.  30 tablet  4  . losartan-hydrochlorothiazide (HYZAAR) 50-12.5 MG per tablet Take 1 tablet by mouth daily.      . nitrofurantoin, macrocrystal-monohydrate, (MACROBID) 100 MG capsule Take 100 mg by mouth daily.       Marland Kitchen NITROSTAT 0.4 MG SL tablet Place 0.4 mg under the tongue every 5 (five) minutes as needed. For chest pain      . omeprazole (PRILOSEC) 40 MG capsule Take 20 mg by mouth daily.      . simvastatin (ZOCOR) 40 MG tablet 40 mg every morning.       . solifenacin (VESICARE) 5 MG tablet Take 5 mg by mouth daily.       . tamsulosin (FLOMAX) 0.4 MG CAPS capsule Take 0.4 mg by mouth daily.       Scheduled: . [START ON 02/23/2013] aspirin EC  81 mg Oral Daily  . [START ON 02/23/2013] carvedilol  18.75 mg Oral BID WC  . dabigatran  150 mg Oral Q12H  . [START ON 02/23/2013] darifenacin  7.5 mg Oral Daily  . [START ON 02/23/2013] dutasteride  0.5 mg Oral Daily  . [START ON 02/23/2013] fluticasone  2 spray Each Nare Daily  . [START ON 02/23/2013] isosorbide mononitrate  30 mg Oral Daily  . [START ON 02/23/2013] nitrofurantoin (macrocrystal-monohydrate)  100 mg Oral Daily  . [START ON 02/23/2013] pantoprazole  40 mg Oral Daily  . [START ON 02/23/2013] simvastatin  40 mg Oral QHS  . sodium chloride  3 mL Intravenous Q12H  . sodium chloride  3 mL Intravenous Q12H  . [START ON 02/23/2013] tamsulosin  0.4 mg Oral Daily  . [START ON 02/23/2013] cyanocobalamin  100 mcg Oral Daily    ROS: History obtained from the patient  General ROS: negative for - chills, fatigue, fever, night sweats, weight gain or weight loss Psychological ROS: negative for - behavioral disorder, hallucinations, memory difficulties, mood swings or suicidal ideation Ophthalmic ROS: negative for - blurry vision, double vision, eye pain or loss of vision ENT ROS: negative for - epistaxis, nasal discharge, oral lesions, sore throat, tinnitus  Allergy and Immunology ROS: negative for - hives or itchy/watery eyes Hematological and Lymphatic ROS: negative for - bleeding problems, bruising or swollen lymph nodes Endocrine ROS: negative for - galactorrhea, hair pattern changes, polydipsia/polyuria or temperature intolerance Respiratory ROS: negative  for - cough, hemoptysis, shortness of breath or wheezing Cardiovascular ROS: negative for - chest pain, dyspnea on exertion, edema or irregular heartbeat Gastrointestinal ROS: negative for - abdominal pain, diarrhea, hematemesis, nausea/vomiting or stool incontinence Genito-Urinary ROS: negative for - dysuria, hematuria, incontinence or urinary frequency/urgency Musculoskeletal ROS: right knee pain Neurological ROS: as noted in HPI Dermatological ROS: negative for rash and skin lesion changes  Physical Examination:  Blood pressure 135/60, pulse 59, temperature 98.2 F (36.8 C), temperature source Oral, resp. rate 16, height 5\' 8"  (1.727 m), weight 80.695 kg (177 lb 14.4 oz), SpO2 100.00%.  Neurologic Examination Mental Status: Alert, oriented, thought content appropriate.  Speech fluent without evidence of aphasia.  Able to follow 3 step commands without difficulty. Cranial Nerves: II: Discs flat bilaterally; Visual fields grossly normal, pupils equal, round, reactive to light and accommodation III,IV, VI: ptosis not present, extra-ocular motions intact bilaterally V,VII: smile symmetric, facial light touch sensation normal bilaterally VIII: hearing normal bilaterally IX,X: gag reflex present XI: bilateral shoulder shrug XII: midline tongue extension Motor: Right : Upper extremity   5/5    Left:     Upper extremity   5/5  Lower extremity   5/5     Lower extremity   5/5 Tone and bulk:normal tone throughout; no atrophy noted Sensory: Pinprick and light touch intact throughout, bilaterally Deep Tendon Reflexes: 2+ in the upper extremities, absent at the knees bilaterally and left ankle jerk.  1+ right ankle jerk Plantars: Right: downgoing   Left: downgoing Cerebellar: normal finger-to-nose and normal heel-to-shin test Gait: Unable to test CV: pulses palpable throughout   Laboratory Studies:   Basic Metabolic Panel:  Recent Labs Lab 02/22/13 1530  NA 140  K 3.9  CL 105  CO2 26   GLUCOSE 116*  BUN 15  CREATININE 1.31  CALCIUM 8.8    Liver Function Tests:  Recent Labs Lab 02/22/13 1530  AST 13  ALT 6  ALKPHOS 74  BILITOT 0.5  PROT 6.9  ALBUMIN 3.5   No results found for this basename: LIPASE, AMYLASE,  in the last 168 hours No results found for this basename: AMMONIA,  in the last 168 hours  CBC:  Recent Labs Lab 02/22/13 1530  WBC 7.4  NEUTROABS 5.1  HGB 11.9*  HCT 35.5*  MCV 86.4  PLT 141*    Cardiac Enzymes:  Recent Labs Lab 02/22/13 1846  TROPONINI <0.30    BNP: No components found with this basename: POCBNP,   CBG: No results found for this basename: GLUCAP,  in the last 168 hours  Microbiology: Results for orders placed during the hospital encounter of 06/09/11  SURGICAL PCR SCREEN     Status: None   Collection Time    06/09/11 11:42 AM      Result Value Range Status   MRSA, PCR NEGATIVE  NEGATIVE Final   Staphylococcus aureus NEGATIVE  NEGATIVE Final   Comment:            The Xpert SA Assay (FDA     approved for NASAL specimens     only), is one component of     a comprehensive surveillance     program.  It is not intended     to diagnose infection nor to     guide or monitor treatment.    Coagulation Studies: No results found for this basename: LABPROT, INR,  in the last 72 hours  Urinalysis: No results found for this basename: COLORURINE, APPERANCEUR, LABSPEC, PHURINE, GLUCOSEU, HGBUR, BILIRUBINUR, KETONESUR, PROTEINUR, UROBILINOGEN, NITRITE, LEUKOCYTESUR,  in the last 168 hours  Lipid Panel:     Component Value Date/Time   CHOL 119 11/08/2011 0500   TRIG 127 11/08/2011 0500   HDL 29* 11/08/2011 0500   CHOLHDL 4.1 11/08/2011 0500   VLDL 25 11/08/2011 0500   LDLCALC 65 11/08/2011 0500    HgbA1C:  Lab Results  Component Value Date  HGBA1C 6.0* 12/03/2011    Urine Drug Screen:   No results found for this basename: labopia, cocainscrnur, labbenz, amphetmu, thcu, labbarb    Alcohol Level: No results  found for this basename: ETH,  in the last 168 hours  Other results: EKG: sinus rhythm at 60 bpm.  Imaging: Dg Chest 2 View  02/22/2013   *RADIOLOGY REPORT*  Clinical Data: Syncopal episode, dizziness, former smoker  CHEST - 2 VIEW  Comparison: 12/02/2011; 03/02/2011  Findings:  Grossly unchanged enlarged cardiac silhouette and mediastinal contours.  Stable positioning of support apparatus.  The lungs remain hyperexpanded with flattening of the hemidiaphragms. There is unchanged mild diffuse slightly nodular thickening of the interstitium.  There is grossly unchanged grossly symmetric biapical pleural parenchymal thickening.  No new focal airspace opacities.  No pleural effusion or pneumothorax.  No definite evidence of edema.  Unchanged bones including multilevel bulky flowing osteophytosis with preservation of disc spaces throughout the thoracic spine suggestive of DISH.  IMPRESSION: Hyperexpanded lungs and  bronchitic change without acute cardiopulmonary disease.   Original Report Authenticated By: Tacey Ruiz, MD   Ct Head Wo Contrast  02/22/2013   CLINICAL DATA:  Loss of consciousness.  EXAM: CT HEAD WITHOUT CONTRAST  TECHNIQUE: Contiguous axial images were obtained from the base of the skull through the vertex without intravenous contrast.  COMPARISON:  11/07/2011  FINDINGS: There is atrophy and chronic small vessel disease changes. Old bilateral basal ganglia lacunar infarcts, stable. No acute intracranial abnormality. Specifically, no hemorrhage, hydrocephalus, mass lesion, acute infarction, or significant intracranial injury. No acute calvarial abnormality. Visualized paranasal sinuses and mastoids clear. Orbital soft tissues unremarkable.  IMPRESSION: Chronic changes as above. No acute intracranial abnormality.   Electronically Signed   By: Charlett Nose M.D.   On: 02/22/2013 16:54     Assessment/Plan: 79 year old male with multiple medical problems admitted after an episode of syncope.   Patient was seated with the episode.  Has a history of a low EF.  Was then noted to have a tonic episode with unresponsiveness.  Seizure is in the differential but must also consider the possibility that the event was related to hypoperfusion.  Further work up recommended.  CT of the head reviewed and shows no acute changes.    Recommendations: 1.  Patient with an AICD-unable to have an MRI.  Would repeat the head CT in 24 to 48 hours 2.  EEG 3.  Would not anticonvulsant therapy at this time.   4.  Seizure precautions  Thana Farr, MD [facility] [phone] 02/22/2013, 11:11 PM

## 2013-02-22 NOTE — ED Notes (Signed)
[employer] at bedside.  

## 2013-02-22 NOTE — ED Provider Notes (Signed)
CSN: [id number]     Arrival date & time 02/22/13  1438 History   First MD Initiated Contact with Patient 02/22/13 1448     Chief Complaint  Patient presents with  . Loss of Consciousness   HPI Pt was outside gardening.  He started to feel a little dizzy and the next thing her remembers was his family checking on him.  911 was called.  He denies chest pain or shortness of breath but he was sweating a lot.  He does not recall feeling overheated.  No headache.  No new numbness or weakness.  Pt has a pacemaker.  He does have a history of prior stroke. Past Medical History  Diagnosis Date  . Chronic systolic heart failure     EF 84-69% in past;  Echocardiogram 11/08/11: Normal LV wall thickness, EF 45-50%.  Marland Kitchen HTN (hypertension)   . History of diverticulitis of colon   . GERD (gastroesophageal reflux disease)   . History of nephrolithiasis   . Ischemic cardiomyopathy     s/p ICD 2008 (see CHF for EF)  . Urinary retention     foley indwelling 12/2010-04/2011  . Arthritis   . HLD (hyperlipidemia)   . Coronary artery disease     a. Chronically occ RCA. b. Last cardiac catheterization 10/2010: Mid LAD 20-30%, distal LAD 40%, mid D1 40%, mid circumflex 20-30%, distal RCA occluded with left to right collaterals.  . Benign prostatic hypertrophy   . TIA (transient ischemic attack) 10/2011    carotids neg for ICA stenosis  . Atrial fibrillation   . Skin cancer   . BPH (benign prostatic hyperplasia)   . Renal insufficiency     Renal failure in July 2012 secondary to hypotension, ARB use, and   . Esophageal stricture   . Hemorrhoids    Past Surgical History  Procedure Laterality Date  . Cardiac defibrillator placement  12/2006    AICD  . Total knee arthroplasty  01/04/11    L TKA  . Cardiac catheterization  11-05-10  . Joint replacement    . Cataracts removed    . Transurethral resection of prostate  06/14/2011    Procedure: TRANSURETHRAL RESECTION OF THE PROSTATE (TURP);  Surgeon: Valetta Fuller, MD;  Location: [facility];  Service: Urology;  Laterality: N/A;   Family History  Problem Relation Age of Onset  . Heart disease Son     x 2  . Colon cancer Neg Hx    History  Substance Use Topics  . Smoking status: Former Smoker -- 2.00 packs/day for 30 years    Quit date: 06/14/1968  . Smokeless tobacco: Never Used  . Alcohol Use: No    Review of Systems  Allergies  Penicillins  Home Medications   Current Outpatient Rx  Name  Route  Sig  Dispense  Refill  . aspirin 81 MG tablet   Oral   Take 81 mg by mouth daily.         . carvedilol (COREG) 6.25 MG tablet   Oral   Take 18.69 mg by mouth 2 (two) times daily with a meal.         . cyanocobalamin 100 MCG tablet   Oral   Take 100 mcg by mouth daily.          . dabigatran (PRADAXA) 150 MG CAPS capsule   Oral   Take 150 mg by mouth every 12 (twelve) hours.         . dutasteride (AVODART)  0.5 MG capsule   Oral   Take 0.5 mg by mouth every morning.          . fluticasone (FLONASE) 50 MCG/ACT nasal spray   Nasal   Place 2 sprays into the nose daily.         . furosemide (LASIX) 20 MG tablet   Oral   Take 20 mg by mouth.         Marland Kitchen HYDROcodone-acetaminophen (NORCO) 5-325 MG per tablet   Oral   Take 1 tablet by mouth every 6 (six) hours as needed. Pain          . isosorbide mononitrate (IMDUR) 30 MG 24 hr tablet   Oral   Take 1 tablet (30 mg total) by mouth daily.   30 tablet   4   . losartan-hydrochlorothiazide (HYZAAR) 50-12.5 MG per tablet   Oral   Take 1 tablet by mouth daily.         . nitrofurantoin, macrocrystal-monohydrate, (MACROBID) 100 MG capsule   Oral   Take 100 mg by mouth daily.         Marland Kitchen NITROSTAT 0.4 MG SL tablet   Sublingual   Place 0.4 mg under the tongue every 5 (five) minutes as needed. For chest pain         . omeprazole (PRILOSEC) 40 MG capsule   Oral   Take 20 mg by mouth daily.         . simvastatin (ZOCOR) 40 MG tablet      40 mg every  morning.          . solifenacin (VESICARE) 5 MG tablet   Oral   Take 5 mg by mouth daily.          . tamsulosin (FLOMAX) 0.4 MG CAPS capsule   Oral   Take 0.4 mg by mouth daily.          Temp(Src) 97.2 F (36.2 C) (Oral)  Resp 20  SpO2 97% Physical Exam  Nursing note and vitals reviewed. Constitutional: He is oriented to person, place, and time. He appears well-developed and well-nourished. No distress.  HENT:  Head: Normocephalic and atraumatic.  Right Ear: External ear normal.  Left Ear: External ear normal.  Mouth/Throat: Oropharynx is clear and moist.  Eyes: Conjunctivae are normal. Right eye exhibits no discharge. Left eye exhibits no discharge. No scleral icterus.  Neck: Neck supple. No tracheal deviation present.  Cardiovascular: Normal rate, regular rhythm and intact distal pulses.   Pulmonary/Chest: Effort normal and breath sounds normal. No stridor. No respiratory distress. He has no wheezes. He has no rales.  Pacemaker left chest wall  Abdominal: Soft. Bowel sounds are normal. He exhibits no distension. There is no tenderness. There is no rebound and no guarding.  Musculoskeletal: He exhibits no edema and no tenderness.  Neurological: He is alert and oriented to person, place, and time. He has normal strength. No sensory deficit. Cranial nerve deficit:  no gross defecits noted. He exhibits normal muscle tone. He displays no seizure activity. Coordination normal.  No pronator drift bilateral upper extrem, able to hold both legs off bed for 5 seconds, sensation intact in all extremities, no visual field cuts, no left or right sided neglect, slight tongue deviation to the right  Skin: Skin is warm and dry. No rash noted.  Psychiatric: He has a normal mood and affect.    ED Course  Procedures (including critical care time) EKG Normal sinus rhythm incomplete left bundle branch  block Normal axis Normal ST T waves EKG not significantly changed from EKG dated  08/15/2012 Labs Review Labs Reviewed  CBC WITH DIFFERENTIAL - Abnormal; Notable for the following:    RBC 4.11 (*)    Hemoglobin 11.9 (*)    HCT 35.5 (*)    Platelets 141 (*)    All other components within normal limits  COMPREHENSIVE METABOLIC PANEL - Abnormal; Notable for the following:    Glucose, Bld 116 (*)    GFR calc non Af Amer 50 (*)    GFR calc Af Amer 58 (*)    All other components within normal limits  POCT I-STAT TROPONIN I  POCT CBG (FASTING - GLUCOSE)-MANUAL ENTRY   Imaging Review Dg Chest 2 View  02/22/2013   *RADIOLOGY REPORT*  Clinical Data: Syncopal episode, dizziness, former smoker  CHEST - 2 VIEW  Comparison: 12/02/2011; 03/02/2011  Findings:  Grossly unchanged enlarged cardiac silhouette and mediastinal contours.  Stable positioning of support apparatus.  The lungs remain hyperexpanded with flattening of the hemidiaphragms. There is unchanged mild diffuse slightly nodular thickening of the interstitium.  There is grossly unchanged grossly symmetric biapical pleural parenchymal thickening.  No new focal airspace opacities.  No pleural effusion or pneumothorax.  No definite evidence of edema.  Unchanged bones including multilevel bulky flowing osteophytosis with preservation of disc spaces throughout the thoracic spine suggestive of DISH.  IMPRESSION: Hyperexpanded lungs and  bronchitic change without acute cardiopulmonary disease.   Original Report Authenticated By: Tacey Ruiz, MD   Ct Head Wo Contrast  02/22/2013   CLINICAL DATA:  Loss of consciousness.  EXAM: CT HEAD WITHOUT CONTRAST  TECHNIQUE: Contiguous axial images were obtained from the base of the skull through the vertex without intravenous contrast.  COMPARISON:  11/07/2011  FINDINGS: There is atrophy and chronic small vessel disease changes. Old bilateral basal ganglia lacunar infarcts, stable. No acute intracranial abnormality. Specifically, no hemorrhage, hydrocephalus, mass lesion, acute infarction, or  significant intracranial injury. No acute calvarial abnormality. Visualized paranasal sinuses and mastoids clear. Orbital soft tissues unremarkable.  IMPRESSION: Chronic changes as above. No acute intracranial abnormality.   Electronically Signed   By: Charlett Nose M.D.   On: 02/22/2013 16:54    MDM   1. Syncope    Pt remains asymptomatic in the ED.  Pacemaker was interrogated and is functioning properly without dysrhythmia.   Family questioned the possibility of seizure but there did not seemed to be any post ictal phase and they did not witness any typical seizure like activity.    Considering his risk factors, CAD, CHF Pt will be admitted to the hospital for further evaluation and monitoring.    Celene Kras, MD 02/22/13 1726

## 2013-02-22 NOTE — H&P (Signed)
PCP:   [name], MD   Chief Complaint:  Passed out.   HPI: This is a 79 year old male, with known history of CAD s/p cath 10/2010, ischemic cardiomyopathy/chronic systolic CHF, EF 16%-10%, V. Tach, s/p AICD 2008, Atrial fibrillation on Pradaxa, HTN, TIA, dyslipidemia, GERD/Esophageal stricture s/p dilatation 2011, diverticulosis, hemorrhoids, BPH s/p TURP, OA s/p left TKA. History is provided by patient, and supplemented by daughter [name], who was at the bedside, and provided observer account. Over the past couple of weeks, patient has experienced dizziness and sometimes vertigo, on standing from sitting or  Lying position, and on bending over. He however, was feeling quite fine, since waking up in AM of 02/22/13. At about 1:30 PM, he went into the yard to pick sting beans, and worked for about an hour, without any problems. He then came into the house, sat down, and started stringing the beans. About an hour into this task, patient suddenly felt dizzy, with the room spinning around, and became very sweaty, but had no chest pain, palpitations or SOB. That's all patient remembers, till he came to. According to daughter, he had become suddenly rigid, with knife clenched in his fist, and the left side of his face drawn up "as if he was in pain", then his eyes closed. He remained "stiff", and as a matter of fact, the knife was removed from his grip with some difficulty. The whole episode lasted about 5 minutes, and then patient's body relaxed, he came to, and appeared very confused and disoriented, drenched in sweat, but not incontinent, and had no tongue biting. He remained seated in chair,throughout this episode. Daughter called 911, EMS arrived , brought patient to the ED, by which time, his symptoms had resolved. Patient has had tinnitus in the left ear for years, and is hard of hearing. He has had no recent respiratory tract illness.    Allergies:   Allergies  Allergen Reactions  .  Penicillins Itching, Swelling and Rash      Past Medical History  Diagnosis Date  . Chronic systolic heart failure     EF 96-04% in past;  Echocardiogram 11/08/11: Normal LV wall thickness, EF 45-50%.  Marland Kitchen HTN (hypertension)   . History of diverticulitis of colon   . GERD (gastroesophageal reflux disease)   . History of nephrolithiasis   . Ischemic cardiomyopathy     s/p ICD 2008 (see CHF for EF)  . Urinary retention     foley indwelling 12/2010-04/2011  . Arthritis   . HLD (hyperlipidemia)   . Coronary artery disease     a. Chronically occ RCA. b. Last cardiac catheterization 10/2010: Mid LAD 20-30%, distal LAD 40%, mid D1 40%, mid circumflex 20-30%, distal RCA occluded with left to right collaterals.  . Benign prostatic hypertrophy   . TIA (transient ischemic attack) 10/2011    carotids neg for ICA stenosis  . Atrial fibrillation   . Skin cancer   . BPH (benign prostatic hyperplasia)   . Renal insufficiency     Renal failure in July 2012 secondary to hypotension, ARB use, and   . Esophageal stricture   . Hemorrhoids     Past Surgical History  Procedure Laterality Date  . Cardiac defibrillator placement  12/2006    AICD  . Total knee arthroplasty  01/04/11    L TKA  . Cardiac catheterization  11-05-10  . Joint replacement    . Cataracts removed    . Transurethral resection of prostate  06/14/2011  Procedure: TRANSURETHRAL RESECTION OF THE PROSTATE (TURP);  Surgeon: Valetta Fuller, MD;  Location: [facility];  Service: Urology;  Laterality: N/A;    Prior to Admission medications   Medication Sig Start Date End Date Taking? Authorizing Provider  aspirin 81 MG tablet Take 81 mg by mouth daily.   Yes Historical Provider, MD  carvedilol (COREG) 6.25 MG tablet Take 18.69 mg by mouth 2 (two) times daily with a meal.   Yes Historical Provider, MD  cyanocobalamin 100 MCG tablet Take 100 mcg by mouth daily.    Yes Historical Provider, MD  dabigatran (PRADAXA) 150 MG CAPS capsule Take 150  mg by mouth every 12 (twelve) hours.   Yes Historical Provider, MD  dutasteride (AVODART) 0.5 MG capsule Take 0.5 mg by mouth every morning.    Yes Historical Provider, MD  fluticasone (FLONASE) 50 MCG/ACT nasal spray Place 2 sprays into the nose daily.   Yes Historical Provider, MD  furosemide (LASIX) 20 MG tablet Take 20 mg by mouth.   Yes Historical Provider, MD  HYDROcodone-acetaminophen (NORCO) 5-325 MG per tablet Take 1 tablet by mouth every 6 (six) hours as needed. Pain    Yes Historical Provider, MD  isosorbide mononitrate (IMDUR) 30 MG 24 hr tablet Take 1 tablet (30 mg total) by mouth daily. 11/22/12  Yes Newt Lukes, MD  losartan-hydrochlorothiazide (HYZAAR) 50-12.5 MG per tablet Take 1 tablet by mouth daily.   Yes Historical Provider, MD  nitrofurantoin, macrocrystal-monohydrate, (MACROBID) 100 MG capsule Take 100 mg by mouth daily.   Yes Historical Provider, MD  NITROSTAT 0.4 MG SL tablet Place 0.4 mg under the tongue every 5 (five) minutes as needed. For chest pain 08/13/11  Yes Historical Provider, MD  omeprazole (PRILOSEC) 40 MG capsule Take 20 mg by mouth daily. 02/16/13  Yes Meryl Dare, MD  simvastatin (ZOCOR) 40 MG tablet 40 mg every morning.  03/05/11  Yes Wendall Stade, MD  solifenacin (VESICARE) 5 MG tablet Take 5 mg by mouth daily.    Yes Historical Provider, MD  tamsulosin (FLOMAX) 0.4 MG CAPS capsule Take 0.4 mg by mouth daily.   Yes Historical Provider, MD    Social History: Patient reports that he quit smoking about 44 years ago. He has never used smokeless tobacco. He reports that he does not drink alcohol or use illicit drugs.  Family History  Problem Relation Age of Onset  . Heart disease Son     x 2  . Colon cancer Neg Hx     Review of Systems:  As per HPI and chief complaint. Patient denies fatigue, diminished appetite, weight loss, fever, chills, headache, blurred vision, difficulty in speaking, dysphagia, chest pain, cough, shortness of breath,  orthopnea, paroxysmal nocturnal dyspnea, nausea, diaphoresis, abdominal pain, vomiting, diarrhea, belching, heartburn, hematemesis, melena, dysuria, nocturia, urinary frequency, hematochezia, lower extremity swelling, pain, or redness. The rest of the systems review is negative.  Physical Exam:  General:  Patient does not appear to be in obvious acute distress. Alert, communicative, fully oriented, talking in complete sentences, not short of breath at rest.  HEENT:  Mild clinical pallor, no jaundice, no conjunctival injection or discharge. Hydration appears fair.  NECK:  Supple, JVP not seen, no carotid bruits, no palpable lymphadenopathy, no palpable goiter. CHEST:  Clinically clear to auscultation, no wheezes, no crackles. AICD is noted in left subclavian pouch.  HEART:  Sounds 1 and 2 heard, normal, regular, no murmurs. ABDOMEN:  Full, soft, non-tender, no palpable organomegaly, no  palpable masses, normal bowel sounds. GENITALIA:  Not examined. LOWER EXTREMITIES:  No pitting edema, palpable peripheral pulses. MUSCULOSKELETAL SYSTEM:  Generalized osteoarthritic changes, otherwise, normal. CENTRAL NERVOUS SYSTEM:  No focal neurologic deficit on gross examination.  Labs on Admission:  Results for orders placed during the hospital encounter of 02/22/13 (from the past 48 hour(s))  CBC WITH DIFFERENTIAL     Status: Abnormal   Collection Time    02/22/13  3:30 PM      Result Value Range   WBC 7.4  4.0 - 10.5 K/uL   RBC 4.11 (*) 4.22 - 5.81 MIL/uL   Hemoglobin 11.9 (*) 13.0 - 17.0 g/dL   HCT 16.1 (*) 09.6 - 04.5 %   MCV 86.4  78.0 - 100.0 fL   MCH 29.0  26.0 - 34.0 pg   MCHC 33.5  30.0 - 36.0 g/dL   RDW 40.9  81.1 - 91.4 %   Platelets 141 (*) 150 - 400 K/uL   Neutrophils Relative % 69  43 - 77 %   Neutro Abs 5.1  1.7 - 7.7 K/uL   Lymphocytes Relative 20  12 - 46 %   Lymphs Abs 1.5  0.7 - 4.0 K/uL   Monocytes Relative 8  3 - 12 %   Monocytes Absolute 0.6  0.1 - 1.0 K/uL   Eosinophils  Relative 3  0 - 5 %   Eosinophils Absolute 0.2  0.0 - 0.7 K/uL   Basophils Relative 0  0 - 1 %   Basophils Absolute 0.0  0.0 - 0.1 K/uL  COMPREHENSIVE METABOLIC PANEL     Status: Abnormal   Collection Time    02/22/13  3:30 PM      Result Value Range   Sodium 140  135 - 145 mEq/L   Potassium 3.9  3.5 - 5.1 mEq/L   Chloride 105  96 - 112 mEq/L   CO2 26  19 - 32 mEq/L   Glucose, Bld 116 (*) 70 - 99 mg/dL   BUN 15  6 - 23 mg/dL   Creatinine, Ser 7.82  0.50 - 1.35 mg/dL   Calcium 8.8  8.4 - 95.6 mg/dL   Total Protein 6.9  6.0 - 8.3 g/dL   Albumin 3.5  3.5 - 5.2 g/dL   AST 13  0 - 37 U/L   ALT 6  0 - 53 U/L   Alkaline Phosphatase 74  39 - 117 U/L   Total Bilirubin 0.5  0.3 - 1.2 mg/dL   GFR calc non Af Amer 50 (*) >90 mL/min   GFR calc Af Amer 58 (*) >90 mL/min   Comment: (NOTE)     The eGFR has been calculated using the CKD EPI equation.     This calculation has not been validated in all clinical situations.     eGFR's persistently <90 mL/min signify possible Chronic Kidney     Disease.  POCT I-STAT TROPONIN I     Status: None   Collection Time    02/22/13  3:31 PM      Result Value Range   Troponin i, poc 0.00  0.00 - 0.08 ng/mL   Comment 3            Comment: Due to the release kinetics of cTnI,     a negative result within the first hours     of the onset of symptoms does not rule out     myocardial infarction with certainty.     If  myocardial infarction is still suspected,     repeat the test at appropriate intervals.    Radiological Exams on Admission: Dg Chest 2 View  02/22/2013   *RADIOLOGY REPORT*  Clinical Data: Syncopal episode, dizziness, former smoker  CHEST - 2 VIEW  Comparison: 12/02/2011; 03/02/2011  Findings:  Grossly unchanged enlarged cardiac silhouette and mediastinal contours.  Stable positioning of support apparatus.  The lungs remain hyperexpanded with flattening of the hemidiaphragms. There is unchanged mild diffuse slightly nodular thickening of the  interstitium.  There is grossly unchanged grossly symmetric biapical pleural parenchymal thickening.  No new focal airspace opacities.  No pleural effusion or pneumothorax.  No definite evidence of edema.  Unchanged bones including multilevel bulky flowing osteophytosis with preservation of disc spaces throughout the thoracic spine suggestive of DISH.  IMPRESSION: Hyperexpanded lungs and  bronchitic change without acute cardiopulmonary disease.   Original Report Authenticated By: Tacey Ruiz, MD   Ct Head Wo Contrast  02/22/2013   CLINICAL DATA:  Loss of consciousness.  EXAM: CT HEAD WITHOUT CONTRAST  TECHNIQUE: Contiguous axial images were obtained from the base of the skull through the vertex without intravenous contrast.  COMPARISON:  11/07/2011  FINDINGS: There is atrophy and chronic small vessel disease changes. Old bilateral basal ganglia lacunar infarcts, stable. No acute intracranial abnormality. Specifically, no hemorrhage, hydrocephalus, mass lesion, acute infarction, or significant intracranial injury. No acute calvarial abnormality. Visualized paranasal sinuses and mastoids clear. Orbital soft tissues unremarkable.  IMPRESSION: Chronic changes as above. No acute intracranial abnormality.   Electronically Signed   By: Charlett Nose M.D.   On: 02/22/2013 16:54    Assessment/Plan Active Problems:   1. Syncope: Patient presented following a syncopal episode, described as sudden onset rigidity, with left facial grimacing, preceded by vertigo and profuse sweating, not associated with incontinence or tongue-biting. Episode was followed by confusion/disorientation. Patient has retrograde amnesia, for event, and has no history of previous similar episodes. Clincally, a possible seizure episode has to be considered. Head Ct scan was negative for acute findings, MRI is not feasible, due to the presence pf AICD, and patient has no focal neurologic deficit on examination. The [employer] team  interrogated AICD in ED, and reported it to be functioning normally. We shall admit patient for telemetric monitoring, arrange EEG, and consult neurology. Carotid doppler will be ordered. Patient is on low dose ASA and Pradaxa.  2. History of cardiac arrhythmia: Patient has known history of A. Fib, V.tach and is s/p AICD. He is on chronic anticoagulation with Pradaxa, and follows up regularly, with Dr Lewayne Bunting. As described above, AICD interrogation revealed no malfunction.   3. Cardiomyopathy: Patient has CAD/Ischemic cardiomyopathy, and chronic systolic CHF, EF 16%-10%. Clinically, he has no features of CHF decompensation at this time. He routinely follows up with Dr Charlton Haws. 4. CAD (coronary artery disease): Patient has no chest pain or SOB. Initial POC markers are negative. Will complete cycling cardiac enzymes.  5. GERD (gastroesophageal reflux disease: Asymptomatic.  6. OA (osteoarthritis) of knee: Patient has bilateral knee osteoarthritis, and is s/p left TKA. Mobility is limited by chronic right knee pain. Otherwise, stable.  7. History of BPH: S/P TURP. Not problematic.   Further management will depend on clinicial course.   Comment: Patient is FULL CODE.    Time Spent on Admission: 1 Hour.   Jenicka Coxe,CHRISTOPHER 02/22/2013, 6:15 PM

## 2013-02-22 NOTE — ED Notes (Signed)
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

## 2013-02-22 NOTE — ED Notes (Signed)
Spoke to [employer] about interrogating pts AICD. Representative on the way.

## 2013-02-23 ENCOUNTER — Observation Stay (HOSPITAL_COMMUNITY): Payer: Medicare Other

## 2013-02-23 ENCOUNTER — Ambulatory Visit (HOSPITAL_COMMUNITY): Payer: Medicare Other

## 2013-02-23 DIAGNOSIS — I2510 Atherosclerotic heart disease of native coronary artery without angina pectoris: Secondary | ICD-10-CM | POA: Diagnosis not present

## 2013-02-23 DIAGNOSIS — I4891 Unspecified atrial fibrillation: Secondary | ICD-10-CM | POA: Diagnosis not present

## 2013-02-23 DIAGNOSIS — R55 Syncope and collapse: Secondary | ICD-10-CM

## 2013-02-23 DIAGNOSIS — K219 Gastro-esophageal reflux disease without esophagitis: Secondary | ICD-10-CM

## 2013-02-23 DIAGNOSIS — I428 Other cardiomyopathies: Secondary | ICD-10-CM | POA: Diagnosis not present

## 2013-02-23 LAB — CBC
Platelets: 147 10*3/uL — ABNORMAL LOW (ref 150–400)
RBC: 4.28 MIL/uL (ref 4.22–5.81)
WBC: 7.5 10*3/uL (ref 4.0–10.5)

## 2013-02-23 LAB — COMPREHENSIVE METABOLIC PANEL
BUN: 17 mg/dL (ref 6–23)
CO2: 27 mEq/L (ref 19–32)
Chloride: 105 mEq/L (ref 96–112)
Creatinine, Ser: 1.29 mg/dL (ref 0.50–1.35)
GFR calc non Af Amer: 51 mL/min — ABNORMAL LOW (ref >90)
Glucose, Bld: 118 mg/dL — ABNORMAL HIGH (ref 70–99)
Total Bilirubin: 0.5 mg/dL (ref 0.3–1.2)

## 2013-02-23 MED ORDER — LOSARTAN POTASSIUM 50 MG PO TABS
50.0000 mg | ORAL_TABLET | Freq: Every day | ORAL | Status: DC
  Administered 2013-02-23 – 2013-02-24 (×2): 50 mg via ORAL
  Filled 2013-02-23 (×2): qty 1

## 2013-02-23 MED ORDER — ACETAMINOPHEN 325 MG PO TABS: 650.0000 mg | ORAL_TABLET | Freq: Four times a day (QID) | ORAL | Status: DC | PRN

## 2013-02-23 MED ORDER — HYDROCHLOROTHIAZIDE 12.5 MG PO CAPS
12.5000 mg | ORAL_CAPSULE | Freq: Every day | ORAL | Status: DC
  Administered 2013-02-23 – 2013-02-24 (×2): 12.5 mg via ORAL
  Filled 2013-02-23 (×2): qty 1

## 2013-02-23 NOTE — Progress Notes (Signed)
*  PRELIMINARY RESULTS* Vascular Ultrasound Carotid Duplex (Doppler) has been completed.  Preliminary findings: Bilateral:  1-39% ICA stenosis.  Vertebral artery flow is antegrade.     Farrel Demark, RDMS, RVT  02/23/2013, 3:59 PM

## 2013-02-23 NOTE — Progress Notes (Signed)
UR Completed. Dorna Mallet, RN, BSN Nurse Case Manager  [phone]  

## 2013-02-23 NOTE — Procedures (Signed)
EEG report.  Brief clinical history:79 year old male with multiple medical problems admitted after an episode of syncope.  Technique: this is a 17 channel routine scalp EEG performed at the bedside with bipolar and monopolar montages arranged in accordance to the international 10/20 system of electrode placement. One channel was dedicated to EKG recording.  The study was performed during wakefulness and drowsiness. No activating procedures performed.  Description:In the wakeful state, the best background consisted of a medium amplitude, posterior dominant, well sustained, symmetric and reactive 9 Hz rhythm. Drowsiness demonstrated dropout of the alpha rhythm. No focal or generalized epileptiform discharges noted.  No slowing seen.  EKG showed sinus rhythm.  Impression: this is a normal awake and drowsy EEG. Please, be aware that a normal EEG does not exclude the possibility of epilepsy.  Clinical correlation is advised.  Wyatt Portela, MD

## 2013-02-23 NOTE — Progress Notes (Signed)
EEG Completed; Results Pending  

## 2013-02-23 NOTE — Progress Notes (Addendum)
[facility] PROGRESS NOTE  Jacob [name]:[id number] DOB: May 04, 1932 DOA: 02/22/2013 PCP: [name], MD  Assessment/Plan  Syncope:   DDx includes arrhythmia, seizure, TIA or stroke.   -  Repeat CT minimum of 24 hours after initial (1700 today) -  EEG -  Carotid duplex -  Appreciate neurology recommendations -  Continue telemetry:  Paced rhythm and interrogation by cardiology was normal  History of cardiac arrhythmia: Patient has known history of A. Fib, V.tach and is s/p AICD. He is on chronic anticoagulation with Pradaxa, and follows up regularly, with Dr Lewayne Bunting.  -  AICD interrogation revealed no malfunction.  Cardiomyopathy: Patient has CAD/Ischemic cardiomyopathy, and chronic systolic CHF, EF 81%-19%.  -  Appears euvolemic -  Follows up with Dr Charlton Haws.  CAD, chest pain free.   -  Troponins negative -  Continue ASA, BB, statin -  Restart ARB  PAF, chronic stable, rate controlled, paced.  Continue pradaxa  HTN, blood pressure elevated -  Continue BB -  Restart ARB-HCTZ   GERD, stable.  Continue PPI  OA, stable s/p left TKA.  Stable  BPH s/p TURP, stable.    Mild headache, chronic intermittent problem -  Tylenol prn  Diet:  Healthy heart Access:  PIV IVF:  OFF Proph:  pradaxa  Code Status: full Family Communication: patient alone Disposition Plan: pending CT and EEG, likely tomorrow   Consultants:  Neurology  Procedures:  CT head  EEG pending  Antibiotics:  macrobid  HPI/Subjective:  Patient states that he feels well.  Denies pain, lightheadedness, dizziness, SOB, nausea, vomiting, diarrhea, fevers, dysuria.  Mild headache.    Objective: Filed Vitals:   02/22/13 1916 02/23/13 0139 02/23/13 0501 02/23/13 0515  BP: 135/60 156/63 171/69 169/75  Pulse: 59 60 59   Temp: 98.2 F (36.8 C)  97.9 F (36.6 C)   TempSrc: Oral  Oral   Resp: 16 16 18    Height: 5\' 8"  (1.727 m)     Weight: 80.695 kg (177 lb 14.4 oz)  81.194  kg (179 lb)   SpO2: 100% 96% 95%     Intake/Output Summary (Last 24 hours) at 02/23/13 1135 Last data filed at 02/23/13 1133  Gross per 24 hour  Intake    243 ml  Output    100 ml  Net    143 ml   Filed Weights   02/22/13 1916 02/23/13 0501  Weight: 80.695 kg (177 lb 14.4 oz) 81.194 kg (179 lb)    Exam:   General:  CM, No acute distress  HEENT:  NCAT, MMM  Cardiovascular:  RRR, nl S1, S2 no mrg, 2+ pulses, warm extremities  Respiratory:  CTAB, no increased WOB  Abdomen:   NABS, soft, NT/ND  MSK:   Normal tone and bulk, no LEE  Neuro:  Grossly intact  Data Reviewed: Basic Metabolic Panel:  Recent Labs Lab 02/22/13 1530 02/23/13 0808  NA 140 141  K 3.9 3.7  CL 105 105  CO2 26 27  GLUCOSE 116* 118*  BUN 15 17  CREATININE 1.31 1.29  CALCIUM 8.8 8.9   Liver Function Tests:  Recent Labs Lab 02/22/13 1530 02/23/13 0808  AST 13 14  ALT 6 6  ALKPHOS 74 74  BILITOT 0.5 0.5  PROT 6.9 7.0  ALBUMIN 3.5 3.5   No results found for this basename: LIPASE, AMYLASE,  in the last 168 hours No results found for this basename: AMMONIA,  in the last 168 hours CBC:  Recent Labs Lab 02/22/13 1530 02/23/13 0808  WBC 7.4 7.5  NEUTROABS 5.1  --   HGB 11.9* 12.1*  HCT 35.5* 37.3*  MCV 86.4 87.1  PLT 141* 147*   Cardiac Enzymes:  Recent Labs Lab 02/22/13 1846 02/23/13 0017 02/23/13 0808  TROPONINI <0.30 <0.30 <0.30   BNP (last 3 results) No results found for this basename: PROBNP,  in the last 8760 hours CBG: No results found for this basename: GLUCAP,  in the last 168 hours  No results found for this or any previous visit (from the past 240 hour(s)).   Studies: Dg Chest 2 View  02/22/2013   *RADIOLOGY REPORT*  Clinical Data: Syncopal episode, dizziness, former smoker  CHEST - 2 VIEW  Comparison: 12/02/2011; 03/02/2011  Findings:  Grossly unchanged enlarged cardiac silhouette and mediastinal contours.  Stable positioning of support apparatus.  The  lungs remain hyperexpanded with flattening of the hemidiaphragms. There is unchanged mild diffuse slightly nodular thickening of the interstitium.  There is grossly unchanged grossly symmetric biapical pleural parenchymal thickening.  No new focal airspace opacities.  No pleural effusion or pneumothorax.  No definite evidence of edema.  Unchanged bones including multilevel bulky flowing osteophytosis with preservation of disc spaces throughout the thoracic spine suggestive of DISH.  IMPRESSION: Hyperexpanded lungs and  bronchitic change without acute cardiopulmonary disease.   Original Report Authenticated By: Tacey Ruiz, MD   Ct Head Wo Contrast  02/22/2013   CLINICAL DATA:  Loss of consciousness.  EXAM: CT HEAD WITHOUT CONTRAST  TECHNIQUE: Contiguous axial images were obtained from the base of the skull through the vertex without intravenous contrast.  COMPARISON:  11/07/2011  FINDINGS: There is atrophy and chronic small vessel disease changes. Old bilateral basal ganglia lacunar infarcts, stable. No acute intracranial abnormality. Specifically, no hemorrhage, hydrocephalus, mass lesion, acute infarction, or significant intracranial injury. No acute calvarial abnormality. Visualized paranasal sinuses and mastoids clear. Orbital soft tissues unremarkable.  IMPRESSION: Chronic changes as above. No acute intracranial abnormality.   Electronically Signed   By: Charlett Nose M.D.   On: 02/22/2013 16:54    Scheduled Meds: . aspirin EC  81 mg Oral Daily  . carvedilol  18.75 mg Oral BID WC  . dabigatran  150 mg Oral Q12H  . darifenacin  7.5 mg Oral Daily  . dutasteride  0.5 mg Oral Daily  . fluticasone  2 spray Each Nare Daily  . isosorbide mononitrate  30 mg Oral Daily  . nitrofurantoin (macrocrystal-monohydrate)  100 mg Oral Daily  . pantoprazole  40 mg Oral Daily  . simvastatin  40 mg Oral QHS  . sodium chloride  3 mL Intravenous Q12H  . sodium chloride  3 mL Intravenous Q12H  . tamsulosin  0.4 mg  Oral Daily  . cyanocobalamin  100 mcg Oral Daily   Continuous Infusions:   Active Problems:   Syncope   History of cardiac arrhythmia   Cardiomyopathy   CAD (coronary artery disease)   GERD (gastroesophageal reflux disease)   OA (osteoarthritis) of knee   History of BPH    Time spent: 30 min    Airelle Everding, [facility]  [facility] Pager [phone]. If 7PM-7AM, please contact night-coverage at [email or web address], password [facility] 02/23/2013, 11:35 AM  LOS: 1 day

## 2013-02-23 NOTE — Progress Notes (Signed)
[facility]URO HOSPITALIST PROGRESS NOTE   SUBJECTIVE:                                                                                                                        Patient has had no further episodes since admission.  He feels he is back to his baseline. EEG is pending.   OBJECTIVE:                                                                                                                           Vital signs in last 24 hours: Temp:  [97.2 F (36.2 C)-98.2 F (36.8 C)] 97.9 F (36.6 C) (09/12 0501) Pulse Rate:  [59-76] 59 (09/12 0501) Resp:  [14-20] 18 (09/12 0501) BP: (122-171)/(60-77) 169/75 mmHg (09/12 0515) SpO2:  [95 %-100 %] 95 % (09/12 0501) Weight:  [80.695 kg (177 lb 14.4 oz)-81.194 kg (179 lb)] 81.194 kg (179 lb) (09/12 0501)  Intake/Output from previous day: 09/11 0701 - 09/12 0700 In: 120 [P.O.:120] Out: 100 [Urine:100] Intake/Output this shift: Total I/O In: 120 [P.O.:120] Out: -  Nutritional status: Cardiac  Past Medical History  Diagnosis Date  . Chronic systolic heart failure     EF 16-10% in past;  Echocardiogram 11/08/11: Normal LV wall thickness, EF 45-50%.  Marland Kitchen HTN (hypertension)   . History of diverticulitis of colon   . GERD (gastroesophageal reflux disease)   . History of nephrolithiasis   . Ischemic cardiomyopathy     s/p ICD 2008 (see CHF for EF)  . Urinary retention     foley indwelling 12/2010-04/2011  . Arthritis   . HLD (hyperlipidemia)   . Coronary artery disease     a. Chronically occ RCA. b. Last cardiac catheterization 10/2010: Mid LAD 20-30%, distal LAD 40%, mid D1 40%, mid circumflex 20-30%, distal RCA occluded with left to right collaterals.  . Benign prostatic hypertrophy   . TIA (transient ischemic attack) 10/2011    carotids neg for ICA stenosis  . Atrial fibrillation   . Skin cancer   . BPH (benign prostatic hyperplasia)   . Renal insufficiency     Renal failure in July 2012 secondary to  hypotension, ARB use, and   . Esophageal stricture   . Hemorrhoids  Neurologic Exam:   Mental Status: Alert, oriented, thought content appropriate.  Speech fluent without evidence of aphasia.  Able to follow 3 step commands without difficulty. Cranial Nerves: II: Visual fields grossly normal, pupils equal, round, reactive to light and accommodation III,IV, VI: ptosis left eye (old), extra-ocular motions intact bilaterally V,VII: smile symmetric, facial light touch sensation normal bilaterally VIII: hearing normal bilaterally IX,X: gag reflex present XI: bilateral shoulder shrug XII: midline tongue extension Motor: Right : Upper extremity   5/5    Left:     Upper extremity   5/5  Lower extremity   5/5     Lower extremity   5/5 Tone and bulk:normal tone throughout; no atrophy noted Sensory: Pinprick and light touch intact throughout, bilaterally Deep Tendon Reflexes:  Right: Upper Extremity   Left: Upper extremity   biceps (C-5 to C-6) 2/4   biceps (C-5 to C-6) 2/4 tricep (C7) 2/4    triceps (C7) 2/4 Brachioradialis (C6) 2/4  Brachioradialis (C6) 2/4  Lower Extremity Lower Extremity  quadriceps (L-2 to L-4) 1/4   quadriceps (L-2 to L-4) 1/4 Achilles (S1) 0/4   Achilles (S1) 0/4  Plantars: Right: downgoing   Left: downgoing Cerebellar: normal finger-to-nose,  normal heel-to-shin test CV: pulses palpable throughout   Lab Results: Lab Results  Component Value Date/Time   CHOL 119 11/08/2011  5:00 AM   Lipid Panel No results found for this basename: CHOL, TRIG, HDL, CHOLHDL, VLDL, LDLCALC,  in the last 72 hours  Studies/Results: Dg Chest 2 View  02/22/2013   *RADIOLOGY REPORT*  Clinical Data: Syncopal episode, dizziness, former smoker  CHEST - 2 VIEW  Comparison: 12/02/2011; 03/02/2011  Findings:  Grossly unchanged enlarged cardiac silhouette and mediastinal contours.  Stable positioning of support apparatus.  The lungs remain hyperexpanded with flattening of the  hemidiaphragms. There is unchanged mild diffuse slightly nodular thickening of the interstitium.  There is grossly unchanged grossly symmetric biapical pleural parenchymal thickening.  No new focal airspace opacities.  No pleural effusion or pneumothorax.  No definite evidence of edema.  Unchanged bones including multilevel bulky flowing osteophytosis with preservation of disc spaces throughout the thoracic spine suggestive of DISH.  IMPRESSION: Hyperexpanded lungs and  bronchitic change without acute cardiopulmonary disease.   Original Report Authenticated By: Tacey Ruiz, MD   Ct Head Wo Contrast  02/22/2013   CLINICAL DATA:  Loss of consciousness.  EXAM: CT HEAD WITHOUT CONTRAST  TECHNIQUE: Contiguous axial images were obtained from the base of the skull through the vertex without intravenous contrast.  COMPARISON:  11/07/2011  FINDINGS: There is atrophy and chronic small vessel disease changes. Old bilateral basal ganglia lacunar infarcts, stable. No acute intracranial abnormality. Specifically, no hemorrhage, hydrocephalus, mass lesion, acute infarction, or significant intracranial injury. No acute calvarial abnormality. Visualized paranasal sinuses and mastoids clear. Orbital soft tissues unremarkable.  IMPRESSION: Chronic changes as above. No acute intracranial abnormality.   Electronically Signed   By: Charlett Nose M.D.   On: 02/22/2013 16:54    MEDICATIONS  Scheduled: . aspirin EC  81 mg Oral Daily  . carvedilol  18.75 mg Oral BID WC  . dabigatran  150 mg Oral Q12H  . darifenacin  7.5 mg Oral Daily  . dutasteride  0.5 mg Oral Daily  . fluticasone  2 spray Each Nare Daily  . isosorbide mononitrate  30 mg Oral Daily  . nitrofurantoin (macrocrystal-monohydrate)  100 mg Oral Daily  . pantoprazole  40 mg Oral Daily  . simvastatin  40 mg Oral QHS  . sodium chloride  3 mL  Intravenous Q12H  . sodium chloride  3 mL Intravenous Q12H  . tamsulosin  0.4 mg Oral Daily  . cyanocobalamin  100 mcg Oral Daily    ASSESSMENT/PLAN:                                                                                                             79 year old male with multiple medical problems admitted after an episode of syncope. As states previously, patient has AICD, history of low EF, currently on Dabigatran and ASA. Seizure is in the differential but must also consider the possibility that the event was related to hypoperfusion.    Recommend: 1) EEG -pending 2) repeat CT tomorrow 3) no initiation of AED at this time unless EEG shows indication that warrants AED.  4) Seizure precaution.    Assessment and plan discussed with with attending physician and they are in agreement.    Felicie Morn PA-C [facility] [phone]  02/23/2013, 9:45 AM

## 2013-02-24 ENCOUNTER — Observation Stay (HOSPITAL_COMMUNITY): Payer: Medicare Other

## 2013-02-24 DIAGNOSIS — R55 Syncope and collapse: Secondary | ICD-10-CM | POA: Diagnosis not present

## 2013-02-24 DIAGNOSIS — I428 Other cardiomyopathies: Secondary | ICD-10-CM | POA: Diagnosis not present

## 2013-02-24 DIAGNOSIS — I4891 Unspecified atrial fibrillation: Secondary | ICD-10-CM | POA: Diagnosis not present

## 2013-02-24 DIAGNOSIS — I2510 Atherosclerotic heart disease of native coronary artery without angina pectoris: Secondary | ICD-10-CM | POA: Diagnosis not present

## 2013-02-24 NOTE — Progress Notes (Signed)
DC IV, DC Tele, DC Home. Discharge instructions and home medications discussed with patient. Patient denied any questions or concerns at this time. Patient leaving unit via wheelchair and appears in no acute distress.  

## 2013-02-24 NOTE — Progress Notes (Signed)
Pt. Resting well during the night. No s/s of distress or discomfort noted. Pt. Denies pain. RN will continue to monitor pt. For changes in condition. Evadene Wardrip, Cheryll Dessert

## 2013-02-24 NOTE — Discharge Summary (Signed)
Physician Discharge Summary  Jacob [name]:[id number] DOB: Dec 23, 1931 DOA: 02/22/2013  PCP: [name], MD  Admit date: 02/22/2013 Discharge date: 02/24/2013  Recommendations for Outpatient Follow-up:  1. Follow up with neurology in 1 month for further evaluation of unresponsive spell.   2. Follow up with primary care doctor in 2 weeks for the same 3. No medication changes.   Discharge Diagnoses:  Active Problems:   Syncope   History of cardiac arrhythmia   Cardiomyopathy   CAD (coronary artery disease)   GERD (gastroesophageal reflux disease)   OA (osteoarthritis) of knee   History of BPH   Discharge Condition: Stable improved  Diet recommendation: The heart  Wt Readings from Last 3 Encounters:  02/24/13 80.1 kg (176 lb 9.4 oz)  08/15/12 84.732 kg (186 lb 12.8 oz)  02/23/12 82.609 kg (182 lb 1.9 oz)    History of present illness:  This is a 79 year old male, with known history of CAD s/p cath 10/2010, ischemic cardiomyopathy/chronic systolic CHF, EF 81%-19%, V. Tach, s/p AICD 2008, Atrial fibrillation on Pradaxa, HTN, TIA, dyslipidemia, GERD/Esophageal stricture s/p dilatation 2011, diverticulosis, hemorrhoids, BPH s/p TURP, OA s/p left TKA. History is provided by patient, and supplemented by daughter Jacob [name], who was at the bedside, and provided observer account. Over the past couple of weeks, patient has experienced dizziness and sometimes vertigo, on standing from sitting or Lying position, and on bending over. He however, was feeling quite fine, since waking up in AM of 02/22/13. At about 1:30 PM, he went into the yard to pick sting beans, and worked for about an hour, without any problems. He then came into the house, sat down, and started stringing the beans. About an hour into this task, patient suddenly felt dizzy, with the room spinning around, and became very sweaty, but had no chest pain, palpitations or SOB. That's all patient remembers, till he came to.  According to daughter, he had become suddenly rigid, with knife clenched in his fist, and the left side of his face drawn up "as if he was in pain", then his eyes closed. He remained "stiff", and as a matter of fact, the knife was removed from his grip with some difficulty. The whole episode lasted about 5 minutes, and then patient's body relaxed, he came to, and appeared very confused and disoriented, drenched in sweat, but not incontinent, and had no tongue biting. He remained seated in chair,throughout this episode. Daughter called 911, EMS arrived , brought patient to the ED, by which time, his symptoms had resolved. Patient has had tinnitus in the left ear for years, and is hard of hearing. He has had no recent respiratory tract illness.    Hospital Course:   [name] was admitted with syncope. Differential diagnoses included arrhythmia, seizure, TIA or stroke. He was seen by neurology. He underwent CT of the head which was initially negative and a repeat CT scan 48 hours later which was stable. It is unlikely that he had a stroke. He had an EEG which demonstrated no epileptiform activity. His carotid duplex demonstrated 1-39% ICA stenosis and antegrade vertebral artery flow.  His EKG was stable and telemetry demonstrated a paced rhythm. His pacemaker was interrogated by cardiology and was normal.  Orthostatics demonstrated a small drop in systolic and diastolic blood pressure without a change in pulse from lying to sitting which would qualify him for orthostatic hypotension, however he remained a symptomatic. When he was asked to stand, his heart rate  remained stable and his blood pressure rose. Again he was asymptomatic.  He should followup with his primary care doctor within one to 2 weeks of discharge. It is unclear what caused his unresponsive episode.  This may be a sign of developing dementia, however, currently, his memory appears generally intact.  Seizure is not excluded by a single EEG.   Referral for neurology within one month.    History of cardiac arrhythmia: The patient has a known history of atrial fibrillation, ventricular tachycardia status post AICD placement. He is on chronic anticoagulation with pradaxa.  His heart remained in the 60s, paced rhythm.  Ischemic cardiomyopathy with ejection fraction of 25-30%. Clinically euvolemic. Followup with cardiology at regular scheduled appointment.  CAD, chest pain-free. Troponins were negative. He continued aspirin, statin, beta blocker. His ARB was restarted.  Hypertension, his blood pressure was mildly elevated. This is likely due to his ARB and HCTZ been withheld at the time of admission. These medications were restarted and his blood pressure trended down to normal range.  GERD, stable. Continue PPI.  BPH s/p TURP.  Continue prophylactic macrobid, flomax, vesicare.    He had a mild headache that improved with Tylenol.  Consultants:  Neurology Procedures:  CT head  EEG pending Antibiotics:  macrobid   Discharge Exam: Filed Vitals:   02/24/13 0818  BP: 137/71  Pulse: 71  Temp:   Resp:    Filed Vitals:   02/24/13 0811 02/24/13 0812 02/24/13 0814 02/24/13 0818  BP: 150/63 123/61 139/55 137/71  Pulse: 66 67 71 71  Temp:      TempSrc:      Resp:      Height:      Weight:      SpO2:       Patient feels well, asking to go home.  General: CM, No acute distress  HEENT: NCAT, MMM  Cardiovascular: RRR, nl S1, S2 no mrg, 2+ pulses, warm extremities  Respiratory: CTAB, no increased WOB  Abdomen: NABS, soft, NT/ND  MSK: Normal tone and bulk, no LEE  Neuro: Grossly intact   Discharge Instructions      Discharge Orders   Future Appointments Provider Department Dept Phone   02/28/2013 2:15 PM Meryl Dare, MD [facility] [phone]   05/21/2013 2:30 PM [facility] Main Office [place]) [phone]   Future Orders Complete By Expires   (HEART FAILURE  PATIENTS) Call MD:  Anytime you have any of the following symptoms: 1) 3 pound weight gain in 24 hours or 5 pounds in 1 week 2) shortness of breath, with or without a dry hacking cough 3) swelling in the hands, feet or stomach 4) if you have to sleep on extra pillows at night in order to breathe.  As directed    Call MD for:  difficulty breathing, headache or visual disturbances  As directed    Call MD for:  extreme fatigue  As directed    Call MD for:  hives  As directed    Call MD for:  persistant dizziness or light-headedness  As directed    Call MD for:  persistant nausea and vomiting  As directed    Call MD for:  severe uncontrolled pain  As directed    Call MD for:  temperature >100.4  As directed    Diet - low sodium heart healthy  As directed    Discharge instructions  As directed    Comments:     You were hospitalized  with syncope.  Tests for heart rhythm problems and heart attack were negative.  Your are CAT scans of the brain did not show signs of stroke. You had an EEG, a test for seizure, which was normal.  A normal test does not completely rule out seizure.  We are unsure what caused your episode of unresponsiveness so please follow up with your primary care doctor within two weeks and with a local neurology group within one month for further investigation. No driving, babysitting, swimming, or climbing until cleared by a physician.   Driving Restrictions  As directed    Comments:     No driving unclear cleared by your primary care doctor.   Increase activity slowly  As directed        Medication List         aspirin 81 MG tablet  Take 81 mg by mouth daily.     AVODART 0.5 MG capsule  Generic drug:  dutasteride  Take 0.5 mg by mouth every morning.     carvedilol 6.25 MG tablet  Commonly known as:  COREG  Take 18.69 mg by mouth 2 (two) times daily with a meal.     cyanocobalamin 100 MCG tablet  Take 100 mcg by mouth daily.     dabigatran 150 MG Caps capsule  Commonly  known as:  PRADAXA  Take 150 mg by mouth every 12 (twelve) hours.     fluticasone 50 MCG/ACT nasal spray  Commonly known as:  FLONASE  Place 2 sprays into the nose daily.     furosemide 20 MG tablet  Commonly known as:  LASIX  Take 20 mg by mouth.     HYDROcodone-acetaminophen 5-325 MG per tablet  Commonly known as:  NORCO/VICODIN  - Take 1 tablet by mouth every 6 (six) hours as needed. Pain  -      isosorbide mononitrate 30 MG 24 hr tablet  Commonly known as:  IMDUR  Take 1 tablet (30 mg total) by mouth daily.     losartan-hydrochlorothiazide 50-12.5 MG per tablet  Commonly known as:  HYZAAR  Take 1 tablet by mouth daily.     nitrofurantoin (macrocrystal-monohydrate) 100 MG capsule  Commonly known as:  MACROBID  Take 100 mg by mouth daily.     NITROSTAT 0.4 MG SL tablet  Generic drug:  nitroGLYCERIN  Place 0.4 mg under the tongue every 5 (five) minutes as needed. For chest pain     omeprazole 40 MG capsule  Commonly known as:  PRILOSEC  Take 20 mg by mouth daily.     simvastatin 40 MG tablet  Commonly known as:  ZOCOR  40 mg every morning.     solifenacin 5 MG tablet  Commonly known as:  VESICARE  Take 5 mg by mouth daily.     tamsulosin 0.4 MG Caps capsule  Commonly known as:  FLOMAX  Take 0.4 mg by mouth daily.       Follow-up Information   Follow up with [name], MD. Schedule an appointment as soon as possible for a visit in 2 weeks.   Specialty:  Internal Medicine   Contact information:   [address] [place] [address] [phone]        The results of significant diagnostics from this hospitalization (including imaging, microbiology, ancillary and laboratory) are listed below for reference.    Significant Diagnostic Studies: Dg Chest 2 View  02/22/2013   *RADIOLOGY REPORT*  Clinical  Data: Syncopal episode, dizziness, former smoker  CHEST - 2 VIEW  Comparison: 12/02/2011; 03/02/2011  Findings:  Grossly  unchanged enlarged cardiac silhouette and mediastinal contours.  Stable positioning of support apparatus.  The lungs remain hyperexpanded with flattening of the hemidiaphragms. There is unchanged mild diffuse slightly nodular thickening of the interstitium.  There is grossly unchanged grossly symmetric biapical pleural parenchymal thickening.  No new focal airspace opacities.  No pleural effusion or pneumothorax.  No definite evidence of edema.  Unchanged bones including multilevel bulky flowing osteophytosis with preservation of disc spaces throughout the thoracic spine suggestive of DISH.  IMPRESSION: Hyperexpanded lungs and  bronchitic change without acute cardiopulmonary disease.   Original Report Authenticated By: Tacey Ruiz, MD   Ct Head Wo Contrast  02/24/2013   *RADIOLOGY REPORT*  Clinical Data: single episode of syncope, possibly due to TIA  CT HEAD WITHOUT CONTRAST  Technique:  Contiguous axial images were obtained from the base of the skull through the vertex without contrast.  Comparison: 02/22/13  Findings: No hemorrhage, extra-axial fluid, or vascular territory infarct.  Stable diffuse atrophy with severe chronic small vessel ischemic change including numerous sub centimeter lacunar infarcts in the deep white matter and bilateral basal ganglia.  IMPRESSION: Stable chronic abnormalities.  No acute process.   Original Report Authenticated By: Esperanza Heir, M.D.   Ct Head Wo Contrast  02/22/2013   CLINICAL DATA:  Loss of consciousness.  EXAM: CT HEAD WITHOUT CONTRAST  TECHNIQUE: Contiguous axial images were obtained from the base of the skull through the vertex without intravenous contrast.  COMPARISON:  11/07/2011  FINDINGS: There is atrophy and chronic small vessel disease changes. Old bilateral basal ganglia lacunar infarcts, stable. No acute intracranial abnormality. Specifically, no hemorrhage, hydrocephalus, mass lesion, acute infarction, or significant intracranial injury. No acute  calvarial abnormality. Visualized paranasal sinuses and mastoids clear. Orbital soft tissues unremarkable.  IMPRESSION: Chronic changes as above. No acute intracranial abnormality.   Electronically Signed   By: Charlett Nose M.D.   On: 02/22/2013 16:54    Microbiology: No results found for this or any previous visit (from the past 240 hour(s)).   Labs: Basic Metabolic Panel:  Recent Labs Lab 02/22/13 1530 02/23/13 0808  NA 140 141  K 3.9 3.7  CL 105 105  CO2 26 27  GLUCOSE 116* 118*  BUN 15 17  CREATININE 1.31 1.29  CALCIUM 8.8 8.9   Liver Function Tests:  Recent Labs Lab 02/22/13 1530 02/23/13 0808  AST 13 14  ALT 6 6  ALKPHOS 74 74  BILITOT 0.5 0.5  PROT 6.9 7.0  ALBUMIN 3.5 3.5   No results found for this basename: LIPASE, AMYLASE,  in the last 168 hours No results found for this basename: AMMONIA,  in the last 168 hours CBC:  Recent Labs Lab 02/22/13 1530 02/23/13 0808  WBC 7.4 7.5  NEUTROABS 5.1  --   HGB 11.9* 12.1*  HCT 35.5* 37.3*  MCV 86.4 87.1  PLT 141* 147*   Cardiac Enzymes:  Recent Labs Lab 02/22/13 1846 02/23/13 0017 02/23/13 0808  TROPONINI <0.30 <0.30 <0.30   BNP: BNP (last 3 results) No results found for this basename: PROBNP,  in the last 8760 hours CBG: No results found for this basename: GLUCAP,  in the last 168 hours  Time coordinating discharge: 45 minutes  Signed:  Virdell Hoiland  [facility] 02/24/2013, 10:44 AM

## 2013-02-24 NOTE — Progress Notes (Signed)
NEURO HOSPITALIST PROGRESS NOTE   SUBJECTIVE:                                                                                                                        Offers no neurological complains. No further episodes of paroxysmal dizziness. EEG is unremarkable.  Repeat CT brain pending.  OBJECTIVE:                                                                                                                           Vital signs in last 24 hours: Temp:  [97.7 F (36.5 C)-97.8 F (36.6 C)] 97.8 F (36.6 C) (09/13 0557) Pulse Rate:  [59-71] 71 (09/13 0557) Resp:  [18-20] 18 (09/13 0557) BP: (119-146)/(56-69) 138/56 mmHg (09/13 0557) SpO2:  [98 %-99 %] 99 % (09/13 0557) Weight:  [80.1 kg (176 lb 9.4 oz)] 80.1 kg (176 lb 9.4 oz) (09/13 0557)  Intake/Output from previous day: 09/12 0701 - 09/13 0700 In: 843 [P.O.:840; I.V.:3] Out: 550 [Urine:550] Intake/Output this shift:   Nutritional status: Cardiac  Past Medical History  Diagnosis Date  . Chronic systolic heart failure     EF 16-10% in past;  Echocardiogram 11/08/11: Normal LV wall thickness, EF 45-50%.  Marland Kitchen HTN (hypertension)   . History of diverticulitis of colon   . GERD (gastroesophageal reflux disease)   . History of nephrolithiasis   . Ischemic cardiomyopathy     s/p ICD 2008 (see CHF for EF)  . Urinary retention     foley indwelling 12/2010-04/2011  . Arthritis   . HLD (hyperlipidemia)   . Coronary artery disease     a. Chronically occ RCA. b. Last cardiac catheterization 10/2010: Mid LAD 20-30%, distal LAD 40%, mid D1 40%, mid circumflex 20-30%, distal RCA occluded with left to right collaterals.  . Benign prostatic hypertrophy   . TIA (transient ischemic attack) 10/2011    carotids neg for ICA stenosis  . Atrial fibrillation   . Skin cancer   . BPH (benign prostatic hyperplasia)   . Renal insufficiency     Renal failure in July 2012 secondary to hypotension, ARB use, and    . Esophageal stricture   . Hemorrhoids       Neurologic Exam:  Mental Status:  Alert, oriented, thought content appropriate. Speech fluent without evidence of aphasia. Able to follow 3 step commands without difficulty.  Cranial Nerves:  II: Visual fields grossly normal, pupils equal, round, reactive to light and accommodation  III,IV, VI: ptosis left eye (old), extra-ocular motions intact bilaterally  V,VII: smile symmetric, facial light touch sensation normal bilaterally  VIII: hearing normal bilaterally  IX,X: gag reflex present  XI: bilateral shoulder shrug  XII: midline tongue extension  Motor:  Right : Upper extremity 5/5 Left: Upper extremity 5/5  Lower extremity 5/5 Lower extremity 5/5  Tone and bulk:normal tone throughout; no atrophy noted  Sensory: Pinprick and light touch intact throughout, bilaterally  Deep Tendon Reflexes:  Right: Upper Extremity Left: Upper extremity  biceps (C-5 to C-6) 2/4 biceps (C-5 to C-6) 2/4  tricep (C7) 2/4 triceps (C7) 2/4  Brachioradialis (C6) 2/4 Brachioradialis (C6) 2/4  Lower Extremity Lower Extremity  quadriceps (L-2 to L-4) 1/4 quadriceps (L-2 to L-4) 1/4  Achilles (S1) 0/4 Achilles (S1) 0/4  Plantars:  Right: downgoing Left: downgoing  Cerebellar:  normal finger-to-nose, normal heel-to-shin test  CV: pulses palpable throughout    Lab Results: Lab Results  Component Value Date/Time   CHOL 119 11/08/2011  5:00 AM   Lipid Panel No results found for this basename: CHOL, TRIG, HDL, CHOLHDL, VLDL, LDLCALC,  in the last 72 hours  Studies/Results: Dg Chest 2 View  02/22/2013   *RADIOLOGY REPORT*  Clinical Data: Syncopal episode, dizziness, former smoker  CHEST - 2 VIEW  Comparison: 12/02/2011; 03/02/2011  Findings:  Grossly unchanged enlarged cardiac silhouette and mediastinal contours.  Stable positioning of support apparatus.  The lungs remain hyperexpanded with flattening of the hemidiaphragms. There is unchanged mild diffuse  slightly nodular thickening of the interstitium.  There is grossly unchanged grossly symmetric biapical pleural parenchymal thickening.  No new focal airspace opacities.  No pleural effusion or pneumothorax.  No definite evidence of edema.  Unchanged bones including multilevel bulky flowing osteophytosis with preservation of disc spaces throughout the thoracic spine suggestive of DISH.  IMPRESSION: Hyperexpanded lungs and  bronchitic change without acute cardiopulmonary disease.   Original Report Authenticated By: Tacey Ruiz, MD   Ct Head Wo Contrast  02/22/2013   CLINICAL DATA:  Loss of consciousness.  EXAM: CT HEAD WITHOUT CONTRAST  TECHNIQUE: Contiguous axial images were obtained from the base of the skull through the vertex without intravenous contrast.  COMPARISON:  11/07/2011  FINDINGS: There is atrophy and chronic small vessel disease changes. Old bilateral basal ganglia lacunar infarcts, stable. No acute intracranial abnormality. Specifically, no hemorrhage, hydrocephalus, mass lesion, acute infarction, or significant intracranial injury. No acute calvarial abnormality. Visualized paranasal sinuses and mastoids clear. Orbital soft tissues unremarkable.  IMPRESSION: Chronic changes as above. No acute intracranial abnormality.   Electronically Signed   By: Charlett Nose M.D.   On: 02/22/2013 16:54    MEDICATIONS  I have reviewed the patient's current medications.  ASSESSMENT/PLAN:                                                                                                           Isolated syncopal episode. EEG normal. Will wait for repeat CT brain (can not have MRI due to AICD). From a neurological standpoint, he can be discharge home if CT normal with further work up as outpatient (prolonged 24 hour ambulatory EEG)  Wyatt Portela, MD [facility] [phone]  02/24/2013, 8:06 AM

## 2013-02-28 ENCOUNTER — Encounter: Payer: Self-pay | Admitting: Gastroenterology

## 2013-02-28 ENCOUNTER — Ambulatory Visit (INDEPENDENT_AMBULATORY_CARE_PROVIDER_SITE_OTHER): Payer: Medicare Other | Admitting: Gastroenterology

## 2013-02-28 VITALS — BP 100/60 | HR 76 | Ht 68.0 in | Wt 177.5 lb

## 2013-02-28 DIAGNOSIS — R1319 Other dysphagia: Secondary | ICD-10-CM | POA: Diagnosis not present

## 2013-02-28 DIAGNOSIS — K219 Gastro-esophageal reflux disease without esophagitis: Secondary | ICD-10-CM | POA: Diagnosis not present

## 2013-02-28 MED ORDER — OMEPRAZOLE 40 MG PO CPDR: 40.0000 mg | DELAYED_RELEASE_CAPSULE | Freq: Every day | ORAL | Status: DC

## 2013-02-28 NOTE — Patient Instructions (Addendum)
We have sent the following medications to your pharmacy for you to pick up at your convenience: Omeprazole.  If your acid reflux is stable then you can continue follow up and medication refills with your Primary Care Physician.  Thank you for choosing me and [facility].  Venita Lick. Pleas Koch., MD., Clementeen Graham

## 2013-02-28 NOTE — Progress Notes (Signed)
History of Present Illness: This is a 79 year old male accompanied by his son. His reflux is under very good control on omeprazole 40 mg daily. He notes occasional problems choking when he swallows water and occasional solid food dysphagia. He underwent barium esophagram and speech pathology study last year that were both fairly unremarkable. Mild reflux and presbyesophagus were noted. He exhibits symptoms that are consistent with oropharyngeal dysphasia.  Current Medications, Allergies, Past Medical History, Past Surgical History, Family History and Social History were reviewed in [employer] record.  Physical Exam: General: Well developed , well nourished, elderly, no acute distress Head: Normocephalic and atraumatic Eyes:  sclerae anicteric, EOMI Ears: Normal auditory acuity Mouth: No deformity or lesions Lungs: Clear throughout to auscultation Heart: Regular rate and rhythm; no murmurs, rubs or bruits Abdomen: Soft, non tender and non distended. No masses, hepatosplenomegaly or hernias noted. Normal Bowel sounds Musculoskeletal: Symmetrical with no gross deformities  Pulses:  Normal pulses noted Extremities: No clubbing, cyanosis, edema or deformities noted Neurological: Alert oriented x 4, grossly nonfocal Psychological:  Alert and cooperative. Normal mood and affect  Assessment and Recommendations:  1. GERD. Suspected oropharyngeal disorder. Possible mild esophageal motility. His symptoms are stable and they improve slightly after increasing omeprazole to 40 mg daily. Continue all standard antireflux measures and omeprazole 40 mg daily. His oropharyngeal symptoms worsen consider repeat barium esophagram. Ongoing followup and medication refills with his PCP.

## 2013-03-07 ENCOUNTER — Ambulatory Visit: Payer: Medicare Other | Admitting: Internal Medicine

## 2013-03-07 DIAGNOSIS — Z0289 Encounter for other administrative examinations: Secondary | ICD-10-CM

## 2013-03-14 ENCOUNTER — Encounter: Payer: Self-pay | Admitting: Internal Medicine

## 2013-03-26 ENCOUNTER — Encounter: Payer: Self-pay | Admitting: Internal Medicine

## 2013-03-26 ENCOUNTER — Other Ambulatory Visit (INDEPENDENT_AMBULATORY_CARE_PROVIDER_SITE_OTHER): Payer: Medicare Other

## 2013-03-26 ENCOUNTER — Ambulatory Visit (INDEPENDENT_AMBULATORY_CARE_PROVIDER_SITE_OTHER): Payer: Medicare Other | Admitting: Internal Medicine

## 2013-03-26 VITALS — BP 110/60 | HR 72 | Temp 98.6°F | Wt 182.0 lb

## 2013-03-26 DIAGNOSIS — R4182 Altered mental status, unspecified: Secondary | ICD-10-CM | POA: Diagnosis not present

## 2013-03-26 DIAGNOSIS — E7800 Pure hypercholesterolemia, unspecified: Secondary | ICD-10-CM | POA: Diagnosis not present

## 2013-03-26 DIAGNOSIS — R739 Hyperglycemia, unspecified: Secondary | ICD-10-CM

## 2013-03-26 DIAGNOSIS — R7309 Other abnormal glucose: Secondary | ICD-10-CM

## 2013-03-26 DIAGNOSIS — G459 Transient cerebral ischemic attack, unspecified: Secondary | ICD-10-CM

## 2013-03-26 DIAGNOSIS — Z23 Encounter for immunization: Secondary | ICD-10-CM

## 2013-03-26 LAB — LIPID PANEL: Cholesterol: 119 mg/dL (ref 0–200)

## 2013-03-26 NOTE — Assessment & Plan Note (Signed)
Transient events - 12h in 10/2011 and again 11/2011 -  resolved R facial droop and RUE weakness prior to ER presentation AMS episode 02/2013, but not felt to be TIA - ?sz No MRI/MRA due to AICD S/p neuro eval - neg carotids - follow up planned on chronic anticoag (pradaxa) prior to and during 10/2011 TIA event (known AF) Continue pradaxa and asa 81 qd as per neuro

## 2013-03-26 NOTE — Patient Instructions (Addendum)
It was good to see you today.  Your annual flu shot was given and/or updated today.  We have reviewed your interval records including labs and tests today  Medications reviewed and updated, no prescription changes recommended at this time.  Test(s) ordered today. Your results will be released to MyChart (or called to you) after review, usually within 72hours after test completion. If any changes need to be made, you will be notified at that same time.  we'll make referral to [facility] neurology . Our office will contact you regarding appointment(s) once made.  Please reschedule followup for 6 months; call sooner if problems.

## 2013-03-26 NOTE — Assessment & Plan Note (Signed)
On simva, tolerating well Reviewed last lipids - at goal - check annually The current medical regimen is effective;  continue present plan and medications.

## 2013-03-26 NOTE — Progress Notes (Signed)
Subjective:    Patient ID: Jacob [name], male    DOB: 01-27-1932, 79 y.o.   MRN: [id number]  HPI Here for hospital follow up - Admit date: 02/22/2013 Discharge date: 02/24/2013  Recommendations for Outpatient Follow-up:   1. Follow up with neurology in 1 month for further evaluation of unresponsive spell.    2. Follow up with primary care doctor in 2 weeks for the same 3. No medication changes.  Discharge Diagnoses:  Active Problems:   Syncope   History of cardiac arrhythmia   Cardiomyopathy   CAD (coronary artery disease)   GERD (gastroesophageal reflux disease)   OA (osteoarthritis) of knee   History of BPH   Discharge Condition: Stable improved   Also reviewed chronic medical issues:  Diffuse OA - shoulders, knees and hips - unable to undergo R TKR due to medical issues - follows with [facility] [facility] for pain mgmt  CHF/CAD - follows with [facility] for same - nishan/taylor/[facility] clinic  reports compliance with ongoing medical treatment and no changes in medication dose or frequency. denies adverse side effects related to current therapy. no chest pain, shortness of breath or edema -   HTN - reports compliance with ongoing medical treatment and no changes in medication dose or frequency. denies adverse side effects related to current therapy. no headache or vision changes   dyslipidemia - reports compliance with ongoing medical treatment and no changes in medication dose or frequency. denies adverse side effects related to current therapy.   BPH - hx urinary retention from BOO summer 2012 - follows with dr. Isabel Caprice -uro  -postop 01/2011 requiring indwelling cath, removed 03/2011 - intermittent abx for UTI   Past Medical History  Diagnosis Date  . Chronic systolic heart failure     EF 40-98% in past;  Echocardiogram 11/08/11: Normal LV wall thickness, EF 45-50%.  Marland Kitchen HTN (hypertension)   . History of diverticulitis of colon   . GERD (gastroesophageal reflux disease)   . History of  nephrolithiasis   . Ischemic cardiomyopathy     s/p ICD 2008 (see CHF for EF)  . Urinary retention     foley indwelling 12/2010-04/2011  . Arthritis   . HLD (hyperlipidemia)   . Coronary artery disease     a. Chronically occ RCA. b. Last cardiac catheterization 10/2010: Mid LAD 20-30%, distal LAD 40%, mid D1 40%, mid circumflex 20-30%, distal RCA occluded with left to right collaterals.  . Benign prostatic hypertrophy   . TIA (transient ischemic attack) 10/2011    carotids neg for ICA stenosis  . Atrial fibrillation   . Skin cancer   . BPH (benign prostatic hyperplasia)   . Renal insufficiency     Renal failure in July 2012 secondary to hypotension, ARB use, and   . Esophageal stricture   . Hemorrhoids     Review of Systems  Constitutional: Negative for fever, fatigue and unexpected weight change.  Respiratory: Negative for cough and shortness of breath.   Cardiovascular: Negative for chest pain and leg swelling.  Neurological: Negative for weakness and headaches.       Objective:   Physical Exam BP 110/60  Pulse 72  Temp(Src) 98.6 F (37 C) (Oral)  Wt 182 lb (82.555 kg)  BMI 27.68 kg/m2  SpO2 96% Wt Readings from Last 3 Encounters:  03/26/13 182 lb (82.555 kg)  02/28/13 177 lb 8 oz (80.513 kg)  02/24/13 176 lb 9.4 oz (80.1 kg)   Constitutional:  appears well-developed and well-nourished. No  distress. Wife at side Neck: Normal range of motion. Neck supple. No JVD present. No thyromegaly present.  Cardiovascular: Normal rate, regular rhythm and normal heart sounds.  No murmur heard. no BLE edema Pulmonary/Chest: Effort normal and breath sounds normal. No respiratory distress. no wheezes. Abdominal: Soft. Bowel sounds are normal. Patient exhibits no distension. There is no tenderness. Neurology: AAOx4, CN2-12 symmetrically intact - gait affected by knee OA pain - Psychiatric: he has a normal mood and affect. behavior is normal. Judgment and thought content normal.       Lab Results  Component Value Date   WBC 7.5 02/23/2013   HGB 12.1* 02/23/2013   HCT 37.3* 02/23/2013   PLT 147* 02/23/2013   CHOL 119 11/08/2011   TRIG 127 11/08/2011   HDL 29* 11/08/2011   LDLDIRECT 54.3 08/26/2010   ALT 6 02/23/2013   AST 14 02/23/2013   NA 141 02/23/2013   K 3.7 02/23/2013   CL 105 02/23/2013   CREATININE 1.29 02/23/2013   BUN 17 02/23/2013   CO2 27 02/23/2013   TSH 2.522 12/03/2011   INR 1.57* 12/02/2011   HGBA1C 6.0* 12/03/2011   Dg Chest 2 View  02/22/2013   *RADIOLOGY REPORT*  Clinical Data: Syncopal episode, dizziness, former smoker  CHEST - 2 VIEW  Comparison: 12/02/2011; 03/02/2011  Findings:  Grossly unchanged enlarged cardiac silhouette and mediastinal contours.  Stable positioning of support apparatus.  The lungs remain hyperexpanded with flattening of the hemidiaphragms. There is unchanged mild diffuse slightly nodular thickening of the interstitium.  There is grossly unchanged grossly symmetric biapical pleural parenchymal thickening.  No new focal airspace opacities.  No pleural effusion or pneumothorax.  No definite evidence of edema.  Unchanged bones including multilevel bulky flowing osteophytosis with preservation of disc spaces throughout the thoracic spine suggestive of DISH.  IMPRESSION: Hyperexpanded lungs and  bronchitic change without acute cardiopulmonary disease.   Original Report Authenticated By: Tacey Ruiz, MD   Ct Head Wo Contrast  02/22/2013   CLINICAL DATA:  Loss of consciousness.  EXAM: CT HEAD WITHOUT CONTRAST  TECHNIQUE: Contiguous axial images were obtained from the base of the skull through the vertex without intravenous contrast.  COMPARISON:  11/07/2011  FINDINGS: There is atrophy and chronic small vessel disease changes. Old bilateral basal ganglia lacunar infarcts, stable. No acute intracranial abnormality. Specifically, no hemorrhage, hydrocephalus, mass lesion, acute infarction, or significant intracranial injury. No acute calvarial  abnormality. Visualized paranasal sinuses and mastoids clear. Orbital soft tissues unremarkable.  IMPRESSION: Chronic changes as above. No acute intracranial abnormality.   Electronically Signed   By: Charlett Nose M.D.   On: 02/22/2013 16:54    Assessment & Plan:  AMS - ?syncope?- hospitalization for same September 2014 reviewed. No clear evidence of seizure, but other medical etiologies excluded such as cardiac arrhythmia -no recurrence in the past month -followup with neurology as planned (requests [facility] rather than [facility])  Also see problem list. Medications and labs reviewed today.  Time spent with pt/family today 25 minutes, greater than 50% time spent counseling patient on AMS, ?seizure, and medication review. Also review of prior labs

## 2013-04-16 ENCOUNTER — Ambulatory Visit: Payer: Medicare Other | Admitting: Neurology

## 2013-04-17 ENCOUNTER — Encounter: Payer: Self-pay | Admitting: Neurology

## 2013-05-08 DIAGNOSIS — N139 Obstructive and reflux uropathy, unspecified: Secondary | ICD-10-CM | POA: Diagnosis not present

## 2013-05-08 DIAGNOSIS — N401 Enlarged prostate with lower urinary tract symptoms: Secondary | ICD-10-CM | POA: Diagnosis not present

## 2013-05-09 DIAGNOSIS — M1710 Unilateral primary osteoarthritis, unspecified knee: Secondary | ICD-10-CM | POA: Diagnosis not present

## 2013-05-23 ENCOUNTER — Encounter: Payer: Self-pay | Admitting: Neurology

## 2013-05-23 ENCOUNTER — Ambulatory Visit (INDEPENDENT_AMBULATORY_CARE_PROVIDER_SITE_OTHER): Payer: Medicare Other | Admitting: Neurology

## 2013-05-23 VITALS — BP 136/72 | HR 60 | Temp 98.1°F | Ht 68.5 in | Wt 180.0 lb

## 2013-05-23 DIAGNOSIS — R4182 Altered mental status, unspecified: Secondary | ICD-10-CM

## 2013-05-23 NOTE — Patient Instructions (Signed)
I really don't know what the episode was.  It could certainly have been a seizure, although the presentation wasn't classic.  Since it was an isolated event, and you have been doing well thus far, I would just continue doing what you are doing.  I would not start any medications at this point.  If you should have another spell, then we can consider starting a seizure medication.  I would like you to follow up with me in 6 months.  Call sooner with questions or concerns.

## 2013-05-23 NOTE — Progress Notes (Signed)
NEUROLOGY CONSULTATION NOTE  VENKAT ANKNEY MRN: [id number] DOB: [birth year]  Referring provider: Dr. Felicity Coyer Primary care provider: Dr. Felicity Coyer  Reason for consult:  Episode of unresponsiveness.  HISTORY OF PRESENT ILLNESS: Jacob [name] is a 79 year old right-handed man with history of TIA, CAD, ischemic cardiomyopathy with EF 25-30%, V-tach, s/p AICD, A fib on Pradaxa, and HTN who presents for episode of unresponsiveness.  He is accompanied by his wife.  Records and images were personally reviewed where available.   He was admitted to the hospital from 02/22/13 to 02/24/13 for syncopal event.  On the day of the episode, he was in his garden.  He had a feeling of dizziness, described as feeling that he would pass out.  He was sitting in a chair in the yard cutting string beans with a knife.  His wife handed him another knife to use.  At that point, he was sitting still, with his right hand clenching the knife.  His head was down and his eyes were open.  He had a glazed look.  There was no jerking, tremor or other abnormal movements.  He did not make a sound and he was unresponsive.  His children had to forcibly open his hand to get the knife.  He remained stiff in his chair for about 10 minutes.  His body relaxed and he was diaphoretic.  He was able to talk.  He did not have urinary incontinence or tongue laceration.  He did not exhibit focal numbness, weakness or facial droop.  He was completely amnestic to the whole event and it just came on suddenly.  There was no incontinence or tongue biting.  In the hospital, an initial head CT and follow up CT did not reveal acute stroke.  Carotid dopplers did not reveal hemodynamically significant ICA stenosis.  EKG and telemetry revealed paced rhythm.  His defibrillator was interrogated and was normal.  EEG normal.   Orthostatics from the supine to sitting position did reveal small drop in systolic and diastolic BP, qualifying for orthostatic  hypotension, but patient was not symptomatic.  Since then, he was been feeling well.  No other episodes.  PAST MEDICAL HISTORY: Past Medical History  Diagnosis Date  . Chronic systolic heart failure     EF 40-98% in past;  Echocardiogram 11/08/11: Normal LV wall thickness, EF 45-50%.  Marland Kitchen HTN (hypertension)   . History of diverticulitis of colon   . GERD (gastroesophageal reflux disease)   . History of nephrolithiasis   . Ischemic cardiomyopathy     s/p ICD 2008 (see CHF for EF)  . Urinary retention     foley indwelling 12/2010-04/2011  . Arthritis   . HLD (hyperlipidemia)   . Coronary artery disease     a. Chronically occ RCA. b. Last cardiac catheterization 10/2010: Mid LAD 20-30%, distal LAD 40%, mid D1 40%, mid circumflex 20-30%, distal RCA occluded with left to right collaterals.  . Benign prostatic hypertrophy   . TIA (transient ischemic attack) 10/2011    carotids neg for ICA stenosis  . Atrial fibrillation   . Skin cancer   . BPH (benign prostatic hyperplasia)   . Renal insufficiency     Renal failure in July 2012 secondary to hypotension, ARB use, and   . Esophageal stricture   . Hemorrhoids     PAST SURGICAL HISTORY: Past Surgical History  Procedure Laterality Date  . Cardiac defibrillator placement  12/2006    AICD  . Total knee  arthroplasty  01/04/11    L TKA  . Cardiac catheterization  11-05-10  . Joint replacement    . Cataracts removed    . Transurethral resection of prostate  06/14/2011    Procedure: TRANSURETHRAL RESECTION OF THE PROSTATE (TURP);  Surgeon: Valetta Fuller, MD;  Location: [facility];  Service: Urology;  Laterality: N/A;    MEDICATIONS: Current Outpatient Prescriptions on File Prior to Visit  Medication Sig Dispense Refill  . aspirin 81 MG tablet Take 81 mg by mouth daily.      . carvedilol (COREG) 6.25 MG tablet Take 18.69 mg by mouth 2 (two) times daily with a meal.      . cyanocobalamin 100 MCG tablet Take 100 mcg by mouth daily.       .  dabigatran (PRADAXA) 150 MG CAPS capsule Take 150 mg by mouth every 12 (twelve) hours.      . dutasteride (AVODART) 0.5 MG capsule Take 0.5 mg by mouth every morning.       . fluticasone (FLONASE) 50 MCG/ACT nasal spray Place 2 sprays into the nose daily.      . furosemide (LASIX) 20 MG tablet Take 20 mg by mouth.      Marland Kitchen HYDROcodone-acetaminophen (NORCO) 5-325 MG per tablet Take 1 tablet by mouth every 6 (six) hours as needed. Pain       . isosorbide mononitrate (IMDUR) 30 MG 24 hr tablet Take 1 tablet (30 mg total) by mouth daily.  30 tablet  4  . losartan-hydrochlorothiazide (HYZAAR) 50-12.5 MG per tablet Take 1 tablet by mouth daily.      . nitrofurantoin, macrocrystal-monohydrate, (MACROBID) 100 MG capsule Take 100 mg by mouth daily.      Marland Kitchen NITROSTAT 0.4 MG SL tablet Place 0.4 mg under the tongue every 5 (five) minutes as needed. For chest pain      . omeprazole (PRILOSEC) 40 MG capsule Take 1 capsule (40 mg total) by mouth daily.  30 capsule  11  . simvastatin (ZOCOR) 40 MG tablet 40 mg every morning.       . solifenacin (VESICARE) 5 MG tablet Take 5 mg by mouth daily.       . tamsulosin (FLOMAX) 0.4 MG CAPS capsule Take 0.4 mg by mouth daily.       No current facility-administered medications on file prior to visit.    ALLERGIES: Allergies  Allergen Reactions  . Penicillins Itching, Swelling and Rash    FAMILY HISTORY: Family History  Problem Relation Age of Onset  . Heart disease Son     x 2  . Colon cancer Neg Hx     SOCIAL HISTORY: History   Social History  . Marital Status: Married    Spouse Name: N/A    Number of Children: N/A  . Years of Education: N/A   Occupational History  . retired    Social History Main Topics  . Smoking status: Former Smoker -- 2.00 packs/day for 30 years    Quit date: 06/14/1968  . Smokeless tobacco: Never Used  . Alcohol Use: No  . Drug Use: No  . Sexual Activity: Not on file   Other Topics Concern  . Not on file   Social  History Narrative   Daily caffeine     REVIEW OF SYSTEMS: Constitutional: No fevers, chills, or sweats, no generalized fatigue, change in appetite Eyes: No visual changes, double vision, eye pain Ear, nose and throat: No hearing loss, ear pain, nasal congestion, sore throat Cardiovascular:  No chest pain, palpitations Respiratory:  No shortness of breath at rest or with exertion, wheezes GastrointestinaI: No nausea, vomiting, diarrhea, abdominal pain, fecal incontinence Genitourinary:  No dysuria, urinary retention or frequency Musculoskeletal:  No neck pain, back pain Integumentary: No rash, pruritus, skin lesions Neurological: as above Psychiatric: No depression, insomnia, anxiety Endocrine: No palpitations, fatigue, diaphoresis, mood swings, change in appetite, change in weight, increased thirst Hematologic/Lymphatic:  No anemia, purpura, petechiae. Allergic/Immunologic: no itchy/runny eyes, nasal congestion, recent allergic reactions, rashes  PHYSICAL EXAM: Filed Vitals:   05/23/13 1343  BP: 136/72  Pulse: 60  Temp: 98.1 F (36.7 C)   General: No acute distress Head:  Normocephalic/atraumatic Neck: supple, no paraspinal tenderness, full range of motion Back: No paraspinal tenderness Heart: regular rate and rhythm Lungs: Clear to auscultation bilaterally. Vascular: No carotid bruits. Neurological Exam: Mental status: alert and oriented to person, place, and time, speech fluent and not dysarthric, language intact. Cranial nerves: CN I: not tested CN II: pupils equal, round and reactive to light, visual fields intact, fundi unremarkable. CN III, IV, VI:  full range of motion, no nystagmus, no ptosis CN V: facial sensation intact CN VII: upper and lower face symmetric CN VIII: hearing intact CN IX, X: gag intact, uvula midline CN XI: sternocleidomastoid and trapezius muscles intact CN XII: tongue midline Bulk & Tone: normal, no fasciculations. Motor: 5/5  throughout Sensation: pinprick and vibration intact Deep Tendon Reflexes: 2+ throughout, toes down Finger to nose testing: no dysmetria Gait: normal stance and stride.  No ataxia. Romberg negative.  IMPRESSION: Episode of unresponsiveness.  Etiology unknown.  It could have been a seizure, TIA or cardiovascular-related, but the semiology is really unclear.  PLAN: Since it was an isolated event, workup was unremarkable and he has been doing well, I would not start any medications at this time.  I would still like to see him again for re-evaluation in 6 months.  45 minutes spent with patient, over 50% spent counseling and coordinating care.  Thank you for allowing me to take part in the care of this patient.  Shon Millet, DO  CC:  [name], MD

## 2013-05-31 ENCOUNTER — Encounter: Payer: Self-pay | Admitting: Internal Medicine

## 2013-05-31 ENCOUNTER — Ambulatory Visit (INDEPENDENT_AMBULATORY_CARE_PROVIDER_SITE_OTHER): Payer: Medicare Other | Admitting: *Deleted

## 2013-05-31 DIAGNOSIS — I509 Heart failure, unspecified: Secondary | ICD-10-CM | POA: Diagnosis not present

## 2013-05-31 DIAGNOSIS — R55 Syncope and collapse: Secondary | ICD-10-CM

## 2013-05-31 DIAGNOSIS — I4891 Unspecified atrial fibrillation: Secondary | ICD-10-CM

## 2013-05-31 LAB — MDC_IDC_ENUM_SESS_TYPE_INCLINIC
Battery Voltage: 2.89 V
Date Time Interrogation Session: 20141218050000
HighPow Impedance: 44 Ohm
Implantable Pulse Generator Serial Number: 504434
Lead Channel Impedance Value: 605 Ohm
Lead Channel Pacing Threshold Pulse Width: 0.4 ms
Zone Setting Detection Interval: 285 ms
Zone Setting Detection Interval: 333 ms

## 2013-05-31 NOTE — Progress Notes (Signed)
ICD check in clinic. Normal device function. Thresholds and sensing consistent with previous device measurements. Impedance trends stable over time. No evidence of any ventricular arrhythmias. No mode switches. Histogram distribution appropriate for patient and level of activity. No changes made this session. Device programmed at appropriate safety margins. Device programmed to optimize intrinsic conduction. Batt voltage 2.89V. Pt will follow up with [facility] on 09-05-2013.

## 2013-06-01 ENCOUNTER — Other Ambulatory Visit: Payer: Self-pay | Admitting: Internal Medicine

## 2013-06-01 ENCOUNTER — Other Ambulatory Visit: Payer: Self-pay | Admitting: Cardiovascular Disease

## 2013-07-16 ENCOUNTER — Other Ambulatory Visit: Payer: Self-pay | Admitting: Internal Medicine

## 2013-08-30 ENCOUNTER — Other Ambulatory Visit: Payer: Self-pay | Admitting: Cardiovascular Disease

## 2013-08-30 ENCOUNTER — Other Ambulatory Visit: Payer: Self-pay | Admitting: Internal Medicine

## 2013-08-30 DIAGNOSIS — IMO0002 Reserved for concepts with insufficient information to code with codable children: Secondary | ICD-10-CM | POA: Diagnosis not present

## 2013-08-30 DIAGNOSIS — M5137 Other intervertebral disc degeneration, lumbosacral region: Secondary | ICD-10-CM | POA: Diagnosis not present

## 2013-08-30 DIAGNOSIS — Z79899 Other long term (current) drug therapy: Secondary | ICD-10-CM | POA: Diagnosis not present

## 2013-08-30 DIAGNOSIS — G894 Chronic pain syndrome: Secondary | ICD-10-CM | POA: Diagnosis not present

## 2013-08-30 DIAGNOSIS — M1710 Unilateral primary osteoarthritis, unspecified knee: Secondary | ICD-10-CM | POA: Diagnosis not present

## 2013-09-05 ENCOUNTER — Ambulatory Visit (INDEPENDENT_AMBULATORY_CARE_PROVIDER_SITE_OTHER): Payer: Medicare Other | Admitting: Internal Medicine

## 2013-09-05 ENCOUNTER — Encounter: Payer: Self-pay | Admitting: Internal Medicine

## 2013-09-05 VITALS — BP 123/53 | HR 44 | Ht 67.5 in | Wt 182.0 lb

## 2013-09-05 DIAGNOSIS — I428 Other cardiomyopathies: Secondary | ICD-10-CM | POA: Diagnosis not present

## 2013-09-05 DIAGNOSIS — I509 Heart failure, unspecified: Secondary | ICD-10-CM | POA: Diagnosis not present

## 2013-09-05 DIAGNOSIS — Z95810 Presence of automatic (implantable) cardiac defibrillator: Secondary | ICD-10-CM | POA: Diagnosis not present

## 2013-09-05 DIAGNOSIS — I429 Cardiomyopathy, unspecified: Secondary | ICD-10-CM

## 2013-09-05 LAB — MDC_IDC_ENUM_SESS_TYPE_INCLINIC
HighPow Impedance: 37 Ohm
HighPow Impedance: 39 Ohm
Implantable Pulse Generator Serial Number: 504434
Lead Channel Impedance Value: 555 Ohm
Lead Channel Impedance Value: 902 Ohm
Lead Channel Pacing Threshold Amplitude: 0.2 V — CL
Lead Channel Pacing Threshold Amplitude: 0.4 V
Lead Channel Pacing Threshold Amplitude: 0.6 V
Lead Channel Pacing Threshold Pulse Width: 0.4 ms
Lead Channel Pacing Threshold Pulse Width: 0.6 ms
Lead Channel Pacing Threshold Pulse Width: 0.6 ms
Lead Channel Pacing Threshold Pulse Width: 0.6 ms
Lead Channel Sensing Intrinsic Amplitude: 6.6 mV
Lead Channel Setting Pacing Amplitude: 2 V
MDC IDC MSMT BATTERY VOLTAGE: 2.87 V
MDC IDC MSMT LEADCHNL RA PACING THRESHOLD AMPLITUDE: 0.6 V
MDC IDC MSMT LEADCHNL RA PACING THRESHOLD AMPLITUDE: 0.8 V
MDC IDC MSMT LEADCHNL RA PACING THRESHOLD AMPLITUDE: 1.2 V
MDC IDC MSMT LEADCHNL RA PACING THRESHOLD PULSEWIDTH: 0.6 ms
MDC IDC MSMT LEADCHNL RA SENSING INTR AMPL: 2.8 mV
MDC IDC MSMT LEADCHNL RV PACING THRESHOLD AMPLITUDE: 0.6 V
MDC IDC MSMT LEADCHNL RV PACING THRESHOLD AMPLITUDE: 0.8 V
MDC IDC MSMT LEADCHNL RV PACING THRESHOLD PULSEWIDTH: 0.4 ms
MDC IDC MSMT LEADCHNL RV PACING THRESHOLD PULSEWIDTH: 0.4 ms
MDC IDC MSMT LEADCHNL RV PACING THRESHOLD PULSEWIDTH: 0.4 ms
MDC IDC SESS DTM: 20150325040000
MDC IDC SET LEADCHNL RV PACING AMPLITUDE: 2.4 V
MDC IDC SET LEADCHNL RV PACING PULSEWIDTH: 0.4 ms
MDC IDC SET ZONE DETECTION INTERVAL: 333 ms
MDC IDC STAT BRADY RA PERCENT PACED: 27 %
MDC IDC STAT BRADY RV PERCENT PACED: 0 %
Zone Setting Detection Interval: 285 ms

## 2013-09-05 NOTE — Assessment & Plan Note (Signed)
His [facility] ICD is working normally. Will recheck in several months.

## 2013-09-05 NOTE — Patient Instructions (Signed)
Your physician recommends that you schedule a follow-up appointment in: 3 months in the device clinic and 12 months with Dr Taylor  

## 2013-09-05 NOTE — Assessment & Plan Note (Signed)
His chronic systolic CHF is stable class 2. He will continue his current meds. I have asked the patient to reduce his sodium intake.

## 2013-09-05 NOTE — Progress Notes (Signed)
HPI Jacob [name] returns today for followup. He is a very pleasant 80 year old man with an ischemic cardiomyopathy, chronic systolic heart failure, class II, ventricular tachycardia, and atrial fibrillation, status post ICD implantation. The patient has been well-controlled from his atrial fibrillation on medical therapy and he denies chest pain, or shortness of breath. No peripheral edema. Allergies  Allergen Reactions  . Penicillins Itching, Swelling and Rash     Current Outpatient Prescriptions  Medication Sig Dispense Refill  . aspirin 81 MG tablet Take 325 mg by mouth daily.       . carvedilol (COREG) 6.25 MG tablet TAKE 3 TABLETS BY MOUTH ONCE DAILY WITH MEALS      . dabigatran (PRADAXA) 150 MG CAPS capsule Take 150 mg by mouth every 12 (twelve) hours.      . dutasteride (AVODART) 0.5 MG capsule Take 0.5 mg by mouth every morning.       . fluticasone (FLONASE) 50 MCG/ACT nasal spray INSTILL 2 SPRAYS IN EACH NOSTRIL EVERY DAY FOR ALLERGIES  16 g  3  . furosemide (LASIX) 20 MG tablet Take 20 mg by mouth.      Marland Kitchen HYDROcodone-acetaminophen (NORCO) 5-325 MG per tablet Take 1 tablet by mouth every 6 (six) hours as needed. Pain       . isosorbide mononitrate (IMDUR) 30 MG 24 hr tablet TAKE 1 TABLET BY MOUTH DAILY  30 tablet  5  . losartan-hydrochlorothiazide (HYZAAR) 50-12.5 MG per tablet Take 1 tablet by mouth daily.      . nitrofurantoin, macrocrystal-monohydrate, (MACROBID) 100 MG capsule Take 100 mg by mouth daily.      Marland Kitchen NITROSTAT 0.4 MG SL tablet Place 0.4 mg under the tongue every 5 (five) minutes as needed. For chest pain      . omeprazole (PRILOSEC) 40 MG capsule Take 1 capsule (40 mg total) by mouth daily.  30 capsule  11  . PRADAXA 150 MG CAPS capsule TAKE ONE CAPSULE BY MOUTH TWICE DAILY  60 capsule  5  . simvastatin (ZOCOR) 40 MG tablet 40 mg every morning.       . solifenacin (VESICARE) 5 MG tablet Take 5 mg by mouth daily.       . tamsulosin (FLOMAX) 0.4 MG CAPS capsule Take 0.4  mg by mouth daily.       No current facility-administered medications for this visit.     Past Medical History  Diagnosis Date  . Chronic systolic heart failure     EF 25-30% in past;  Echocardiogram 11/08/11: Normal LV wall thickness, EF 45-50%.  Marland Kitchen HTN (hypertension)   . History of diverticulitis of colon   . GERD (gastroesophageal reflux disease)   . History of nephrolithiasis   . Ischemic cardiomyopathy     s/p ICD 2008 (see CHF for EF)  . Urinary retention     foley indwelling 12/2010-04/2011  . Arthritis   . HLD (hyperlipidemia)   . Coronary artery disease     a. Chronically occ RCA. b. Last cardiac catheterization 10/2010: Mid LAD 20-30%, distal LAD 40%, mid D1 40%, mid circumflex 20-30%, distal RCA occluded with left to right collaterals.  . Benign prostatic hypertrophy   . TIA (transient ischemic attack) 10/2011    carotids neg for ICA stenosis  . Atrial fibrillation   . Skin cancer   . BPH (benign prostatic hyperplasia)   . Renal insufficiency     Renal failure in July 2012 secondary to hypotension, ARB use, and   . Esophageal stricture   .  Hemorrhoids     ROS:   All systems reviewed and negative except as noted in the HPI.   Past Surgical History  Procedure Laterality Date  . Cardiac defibrillator placement  12/2006    AICD  . Total knee arthroplasty  01/04/11    L TKA  . Cardiac catheterization  11-05-10  . Joint replacement    . Cataracts removed    . Transurethral resection of prostate  06/14/2011    Procedure: TRANSURETHRAL RESECTION OF THE PROSTATE (TURP);  Surgeon: Bernestine Amass, MD;  Location: [facility];  Service: Urology;  Laterality: N/A;     Family History  Problem Relation Age of Onset  . Heart disease Son     x 2  . Colon cancer Neg Hx      History   Social History  . Marital Status: Married    Spouse Name: N/A    Number of Children: N/A  . Years of Education: N/A   Occupational History  . retired    Social History Main Topics  .  Smoking status: Former Smoker -- 2.00 packs/day for 30 years    Quit date: 06/14/1968  . Smokeless tobacco: Never Used  . Alcohol Use: No  . Drug Use: No  . Sexual Activity: Not on file   Other Topics Concern  . Not on file   Social History Narrative   Daily caffeine      BP 123/53  Pulse 44  Ht 5' 7.5" (1.715 m)  Wt 182 lb (82.555 kg)  BMI 28.07 kg/m2  Physical Exam:  Well appearing 80 year old man, NAD HEENT: Unremarkable Neck:  6 cm JVD, no thyromegally Lungs:  Clear except for scattered basilar rales, no wheezes, no rhonchi. HEART:  IRegular rate rhythm, no murmurs, no rubs, no clicks Abd:  soft, positive bowel sounds, no organomegally, no rebound, no guarding Ext:  2 plus pulses, no edema, no cyanosis, no clubbing Skin:  No rashes no nodules Neuro:  CN II through XII intact, motor grossly intact   DEVICE  Normal device function.  See PaceArt for details.   Assess/Plan:

## 2013-09-10 ENCOUNTER — Encounter: Payer: Self-pay | Admitting: Internal Medicine

## 2013-09-24 ENCOUNTER — Ambulatory Visit: Payer: Medicare Other | Admitting: Internal Medicine

## 2013-09-24 DIAGNOSIS — Z0289 Encounter for other administrative examinations: Secondary | ICD-10-CM

## 2013-10-04 ENCOUNTER — Encounter: Payer: Self-pay | Admitting: Cardiovascular Disease

## 2013-10-04 ENCOUNTER — Ambulatory Visit (INDEPENDENT_AMBULATORY_CARE_PROVIDER_SITE_OTHER): Payer: Medicare Other | Admitting: Cardiovascular Disease

## 2013-10-04 VITALS — BP 126/60 | HR 61 | Ht 68.0 in | Wt 172.0 lb

## 2013-10-04 DIAGNOSIS — I2510 Atherosclerotic heart disease of native coronary artery without angina pectoris: Secondary | ICD-10-CM | POA: Diagnosis not present

## 2013-10-04 DIAGNOSIS — I10 Essential (primary) hypertension: Secondary | ICD-10-CM

## 2013-10-04 DIAGNOSIS — I509 Heart failure, unspecified: Secondary | ICD-10-CM | POA: Diagnosis not present

## 2013-10-04 DIAGNOSIS — I4891 Unspecified atrial fibrillation: Secondary | ICD-10-CM | POA: Diagnosis not present

## 2013-10-04 NOTE — Patient Instructions (Signed)
Your physician recommends that you continue on your current medications as directed. Please refer to the Current Medication list given to you today.  Your physician wants you to follow-up in: 6 months with Dr. Nishan. You will receive a reminder letter in the mail two months in advance. If you don't receive a letter, please call our office to schedule the follow-up appointment.  

## 2013-10-04 NOTE — Progress Notes (Signed)
Patient ID: Jacob [name], male   DOB: 1931-09-23, 80 y.o.   MRN: [id number] Mr. Ontiveros is a 80 y/o M with somewhat of a complex history, especially recently. He has known ICM/CHF (EF previously 25-30%, most recent 45-50% 2013 ), CAD with chronically occ RCA, atrial fibrillation, CKD. He was admitted 5/26-5/27 with a TIA with R sided facial drooping. He had neg carotid dopplers, EF 45-50%; no MRI due to ICD. At time of his TIA it was noted he was only taking ASA intermittently so was placed on 325mg  daily. However, there is some question about ongoing Pradaxa use despite instruction otherwise. He has a hx of AFib noted on interrogation of his device and was placed on Pradaxa in 04/2010. Some hematuria pre TURP but tolerating anticoagulation   Last cardiac catheterization showed stable anatomy in 10/2010: Mid LAD 20-30%, distal LAD 40%, mid D1 40%, mid circumflex 20-30%, distal RCA occluded with left to right collaterals. ICD was just interrogated 3 weeks ago demonstrating normal device function, no ventricular arrhythmias.   No longer driving  Ambulates slowly       ROS: Denies fever, malais, weight loss, blurry vision, decreased visual acuity, cough, sputum, SOB, hemoptysis, pleuritic pain, palpitaitons, heartburn, abdominal pain, melena, lower extremity edema, claudication, or rash.  All other systems reviewed and negative  General: Affect appropriate Chronically ill white male  HEENT: normal Neck supple with no adenopathy JVP normal no bruits no thyromegaly Lungs clear with no wheezing and good diaphragmatic motion Heart:  S1/S2 no murmur, no rub, gallop or click AICD under left clavicle  PMI normal Abdomen: benighn, BS positve, no tenderness, no AAA no bruit.  No HSM or HJR Distal pulses intact with no bruits No edema Neuro non-focal Skin warm and dry No muscular weakness   Current Outpatient Prescriptions  Medication Sig Dispense Refill  . aspirin 81 MG tablet Take 325 mg by mouth  daily.       . carvedilol (COREG) 6.25 MG tablet TAKE 3 TABLETS BY MOUTH ONCE DAILY WITH MEALS      . dabigatran (PRADAXA) 150 MG CAPS capsule Take 150 mg by mouth every 12 (twelve) hours.      . fluticasone (FLONASE) 50 MCG/ACT nasal spray INSTILL 2 SPRAYS IN EACH NOSTRIL EVERY DAY FOR ALLERGIES  16 g  3  . furosemide (LASIX) 20 MG tablet Take 20 mg by mouth.      Marland Kitchen HYDROcodone-acetaminophen (NORCO) 5-325 MG per tablet Take 1 tablet by mouth every 6 (six) hours as needed. Pain       . isosorbide mononitrate (IMDUR) 30 MG 24 hr tablet TAKE 1 TABLET BY MOUTH DAILY  30 tablet  5  . losartan-hydrochlorothiazide (HYZAAR) 50-12.5 MG per tablet Take 1 tablet by mouth daily.      . nitrofurantoin, macrocrystal-monohydrate, (MACROBID) 100 MG capsule Take 100 mg by mouth daily.      Marland Kitchen NITROSTAT 0.4 MG SL tablet Place 0.4 mg under the tongue every 5 (five) minutes as needed. For chest pain      . omeprazole (PRILOSEC) 40 MG capsule Take 1 capsule (40 mg total) by mouth daily.  30 capsule  11  . simvastatin (ZOCOR) 40 MG tablet 40 mg every morning.       . solifenacin (VESICARE) 5 MG tablet Take 5 mg by mouth daily.        No current facility-administered medications for this visit.    Allergies  Penicillins  Electrocardiogram:  SR rate 61 ICLBBB  Assessment and Plan

## 2013-10-04 NOTE — Assessment & Plan Note (Signed)
Stable with no angina and good activity level.  Continue medical Rx  

## 2013-10-04 NOTE — Assessment & Plan Note (Signed)
With previous TIA  Continue anticoagulation No bleeding issue

## 2013-10-04 NOTE — Assessment & Plan Note (Signed)
Euvolemic no significant fluid overload Functional class 2 continue current meds

## 2013-10-04 NOTE — Assessment & Plan Note (Signed)
Well controlled.  Continue current medications and low sodium Dash type diet.    

## 2013-10-18 ENCOUNTER — Other Ambulatory Visit: Payer: Self-pay | Admitting: Cardiovascular Disease

## 2013-10-18 ENCOUNTER — Other Ambulatory Visit: Payer: Self-pay | Admitting: Internal Medicine

## 2013-11-20 ENCOUNTER — Telehealth: Payer: Self-pay | Admitting: Neurology

## 2013-11-20 NOTE — Telephone Encounter (Signed)
Pt canc tomorrow's 6 month follow up d/t car troubles. She will call later to r/s / Sherri S.  °

## 2013-11-21 ENCOUNTER — Ambulatory Visit: Payer: Medicare Other | Admitting: Neurology

## 2013-12-07 ENCOUNTER — Encounter: Payer: Self-pay | Admitting: Internal Medicine

## 2013-12-17 ENCOUNTER — Ambulatory Visit (INDEPENDENT_AMBULATORY_CARE_PROVIDER_SITE_OTHER): Payer: Medicare Other | Admitting: *Deleted

## 2013-12-17 DIAGNOSIS — I4891 Unspecified atrial fibrillation: Secondary | ICD-10-CM | POA: Diagnosis not present

## 2013-12-17 DIAGNOSIS — Z95810 Presence of automatic (implantable) cardiac defibrillator: Secondary | ICD-10-CM | POA: Diagnosis not present

## 2013-12-17 DIAGNOSIS — I428 Other cardiomyopathies: Secondary | ICD-10-CM

## 2013-12-17 DIAGNOSIS — I509 Heart failure, unspecified: Secondary | ICD-10-CM | POA: Diagnosis not present

## 2013-12-17 DIAGNOSIS — I429 Cardiomyopathy, unspecified: Secondary | ICD-10-CM

## 2013-12-17 LAB — MDC_IDC_ENUM_SESS_TYPE_INCLINIC
Battery Voltage: 2.87 V
HighPow Impedance: 43 Ohm
Implantable Pulse Generator Serial Number: 504434
Lead Channel Pacing Threshold Amplitude: 0.8 V
Lead Channel Sensing Intrinsic Amplitude: 2 mV
Lead Channel Setting Pacing Amplitude: 2 V
Lead Channel Setting Pacing Amplitude: 2.4 V
Lead Channel Setting Pacing Pulse Width: 0.4 ms
MDC IDC MSMT LEADCHNL RA IMPEDANCE VALUE: 583 Ohm
MDC IDC MSMT LEADCHNL RA PACING THRESHOLD PULSEWIDTH: 0.6 ms
MDC IDC MSMT LEADCHNL RV IMPEDANCE VALUE: 871 Ohm
MDC IDC MSMT LEADCHNL RV PACING THRESHOLD AMPLITUDE: 0.6 V
MDC IDC MSMT LEADCHNL RV PACING THRESHOLD PULSEWIDTH: 0.4 ms
MDC IDC MSMT LEADCHNL RV SENSING INTR AMPL: 4.6 mV
MDC IDC SET ZONE DETECTION INTERVAL: 285.71 ms
MDC IDC SET ZONE DETECTION INTERVAL: 333.33 ms
MDC IDC STAT BRADY RA PERCENT PACED: 24 %
MDC IDC STAT BRADY RV PERCENT PACED: 0 %

## 2013-12-17 NOTE — Progress Notes (Signed)
ICD check in clinic. Normal device function. Thresholds and sensing consistent with previous device measurements. Impedance trends stable over time. No evidence of any ventricular arrhythmias. 8 ATR---longest 4hr69min + pradaxa. Histogram distribution appropriate for patient and level of activity. No changes made this session. Device programmed at appropriate safety margins. Device programmed to optimize intrinsic conduction. Battery @2 .87V, MOL 1. ROV w/ device clinic 03/18/14 & w/ Dr. Lovena Le in 15mo.

## 2013-12-20 ENCOUNTER — Encounter: Payer: Self-pay | Admitting: Internal Medicine

## 2013-12-20 DIAGNOSIS — G894 Chronic pain syndrome: Secondary | ICD-10-CM | POA: Diagnosis not present

## 2013-12-20 DIAGNOSIS — M1710 Unilateral primary osteoarthritis, unspecified knee: Secondary | ICD-10-CM | POA: Diagnosis not present

## 2013-12-20 DIAGNOSIS — Z79899 Other long term (current) drug therapy: Secondary | ICD-10-CM | POA: Diagnosis not present

## 2013-12-20 DIAGNOSIS — Z471 Aftercare following joint replacement surgery: Secondary | ICD-10-CM | POA: Diagnosis not present

## 2013-12-23 ENCOUNTER — Telehealth: Payer: Self-pay | Admitting: Internal Medicine

## 2013-12-23 NOTE — Telephone Encounter (Signed)
Entered in error. Wrong patient

## 2014-01-23 ENCOUNTER — Emergency Department (HOSPITAL_COMMUNITY): Payer: Medicare Other

## 2014-01-23 ENCOUNTER — Emergency Department (HOSPITAL_COMMUNITY)
Admission: EM | Admit: 2014-01-23 | Discharge: 2014-01-24 | Disposition: A | Discharge status: Home / Self Care | Payer: Medicare Other | Attending: Emergency Medicine | Admitting: Emergency Medicine

## 2014-01-23 ENCOUNTER — Encounter (HOSPITAL_COMMUNITY): Payer: Self-pay | Admitting: Emergency Medicine

## 2014-01-23 DIAGNOSIS — Z7982 Long term (current) use of aspirin: Secondary | ICD-10-CM | POA: Insufficient documentation

## 2014-01-23 DIAGNOSIS — R1310 Dysphagia, unspecified: Secondary | ICD-10-CM | POA: Diagnosis not present

## 2014-01-23 DIAGNOSIS — Z880 Allergy status to penicillin: Secondary | ICD-10-CM | POA: Diagnosis not present

## 2014-01-23 DIAGNOSIS — Z95810 Presence of automatic (implantable) cardiac defibrillator: Secondary | ICD-10-CM | POA: Insufficient documentation

## 2014-01-23 DIAGNOSIS — T18108A Unspecified foreign body in esophagus causing other injury, initial encounter: Secondary | ICD-10-CM | POA: Insufficient documentation

## 2014-01-23 DIAGNOSIS — Z87442 Personal history of urinary calculi: Secondary | ICD-10-CM | POA: Diagnosis not present

## 2014-01-23 DIAGNOSIS — Z85828 Personal history of other malignant neoplasm of skin: Secondary | ICD-10-CM | POA: Insufficient documentation

## 2014-01-23 DIAGNOSIS — Z860100 Personal history of colon polyps, unspecified: Secondary | ICD-10-CM | POA: Diagnosis not present

## 2014-01-23 DIAGNOSIS — I5022 Chronic systolic (congestive) heart failure: Secondary | ICD-10-CM | POA: Diagnosis not present

## 2014-01-23 DIAGNOSIS — Y9389 Activity, other specified: Secondary | ICD-10-CM | POA: Insufficient documentation

## 2014-01-23 DIAGNOSIS — M129 Arthropathy, unspecified: Secondary | ICD-10-CM | POA: Diagnosis not present

## 2014-01-23 DIAGNOSIS — Y9289 Other specified places as the place of occurrence of the external cause: Secondary | ICD-10-CM | POA: Diagnosis not present

## 2014-01-23 DIAGNOSIS — I10 Essential (primary) hypertension: Secondary | ICD-10-CM | POA: Insufficient documentation

## 2014-01-23 DIAGNOSIS — Z8673 Personal history of transient ischemic attack (TIA), and cerebral infarction without residual deficits: Secondary | ICD-10-CM | POA: Diagnosis not present

## 2014-01-23 DIAGNOSIS — R0789 Other chest pain: Secondary | ICD-10-CM | POA: Diagnosis not present

## 2014-01-23 DIAGNOSIS — Z9889 Other specified postprocedural states: Secondary | ICD-10-CM | POA: Diagnosis not present

## 2014-01-23 DIAGNOSIS — E785 Hyperlipidemia, unspecified: Secondary | ICD-10-CM | POA: Diagnosis not present

## 2014-01-23 DIAGNOSIS — K222 Esophageal obstruction: Secondary | ICD-10-CM

## 2014-01-23 DIAGNOSIS — Z79899 Other long term (current) drug therapy: Secondary | ICD-10-CM | POA: Diagnosis not present

## 2014-01-23 DIAGNOSIS — Z87891 Personal history of nicotine dependence: Secondary | ICD-10-CM | POA: Insufficient documentation

## 2014-01-23 DIAGNOSIS — IMO0002 Reserved for concepts with insufficient information to code with codable children: Secondary | ICD-10-CM | POA: Diagnosis not present

## 2014-01-23 DIAGNOSIS — Z87448 Personal history of other diseases of urinary system: Secondary | ICD-10-CM | POA: Insufficient documentation

## 2014-01-23 DIAGNOSIS — I2510 Atherosclerotic heart disease of native coronary artery without angina pectoris: Secondary | ICD-10-CM | POA: Insufficient documentation

## 2014-01-23 DIAGNOSIS — K219 Gastro-esophageal reflux disease without esophagitis: Secondary | ICD-10-CM | POA: Diagnosis not present

## 2014-01-23 DIAGNOSIS — T18128A Food in esophagus causing other injury, initial encounter: Secondary | ICD-10-CM

## 2014-01-23 MED ORDER — NITROGLYCERIN 0.4 MG SL SUBL
0.4000 mg | SUBLINGUAL_TABLET | SUBLINGUAL | Status: DC | PRN
  Administered 2014-01-23: 0.4 mg via SUBLINGUAL
  Filled 2014-01-23: qty 1

## 2014-01-23 MED ORDER — ONDANSETRON 8 MG PO TBDP
8.0000 mg | ORAL_TABLET | Freq: Once | ORAL | Status: AC
  Administered 2014-01-23: 8 mg via ORAL
  Filled 2014-01-23: qty 1

## 2014-01-23 NOTE — ED Notes (Signed)
Pt reports having food stuck in his esophagus after eating dinner tonight.  Has had his esophagus stretched twice in the past.  Pt is spitting up saliva in triage.

## 2014-01-24 ENCOUNTER — Telehealth: Payer: Self-pay

## 2014-01-24 ENCOUNTER — Encounter (HOSPITAL_COMMUNITY): Payer: Self-pay | Admitting: Gastroenterology

## 2014-01-24 ENCOUNTER — Encounter (HOSPITAL_COMMUNITY)
Admission: EM | Disposition: A | Discharge status: Home / Self Care | Payer: Self-pay | Source: Home / Self Care | Attending: Emergency Medicine

## 2014-01-24 DIAGNOSIS — R1310 Dysphagia, unspecified: Secondary | ICD-10-CM | POA: Diagnosis not present

## 2014-01-24 DIAGNOSIS — W44F3XA Food entering into or through a natural orifice, initial encounter: Secondary | ICD-10-CM

## 2014-01-24 DIAGNOSIS — T18108A Unspecified foreign body in esophagus causing other injury, initial encounter: Secondary | ICD-10-CM | POA: Diagnosis not present

## 2014-01-24 DIAGNOSIS — T18128A Food in esophagus causing other injury, initial encounter: Secondary | ICD-10-CM

## 2014-01-24 DIAGNOSIS — K222 Esophageal obstruction: Secondary | ICD-10-CM

## 2014-01-24 HISTORY — PX: ESOPHAGOGASTRODUODENOSCOPY: SHX5428

## 2014-01-24 LAB — PROTIME-INR
INR: 1.43 (ref 0.00–1.49)
PROTHROMBIN TIME: 17.5 s — AB (ref 11.6–15.2)

## 2014-01-24 LAB — CBC WITH DIFFERENTIAL/PLATELET
Basophils Absolute: 0 10*3/uL (ref 0.0–0.1)
Basophils Relative: 0 % (ref 0–1)
Eosinophils Absolute: 0.2 10*3/uL (ref 0.0–0.7)
Eosinophils Relative: 1 % (ref 0–5)
HEMATOCRIT: 36.1 % — AB (ref 39.0–52.0)
Hemoglobin: 11.9 g/dL — ABNORMAL LOW (ref 13.0–17.0)
Lymphocytes Relative: 13 % (ref 12–46)
Lymphs Abs: 1.4 10*3/uL (ref 0.7–4.0)
MCH: 29 pg (ref 26.0–34.0)
MCHC: 33 g/dL (ref 30.0–36.0)
MCV: 87.8 fL (ref 78.0–100.0)
MONO ABS: 0.6 10*3/uL (ref 0.1–1.0)
Monocytes Relative: 5 % (ref 3–12)
NEUTROS ABS: 8.7 10*3/uL — AB (ref 1.7–7.7)
Neutrophils Relative %: 81 % — ABNORMAL HIGH (ref 43–77)
Platelets: 160 10*3/uL (ref 150–400)
RBC: 4.11 MIL/uL — ABNORMAL LOW (ref 4.22–5.81)
RDW: 14.4 % (ref 11.5–15.5)
WBC: 10.8 10*3/uL — AB (ref 4.0–10.5)

## 2014-01-24 LAB — BASIC METABOLIC PANEL
Anion gap: 11 (ref 5–15)
BUN: 18 mg/dL (ref 6–23)
CHLORIDE: 107 meq/L (ref 96–112)
CO2: 27 meq/L (ref 19–32)
CREATININE: 1.26 mg/dL (ref 0.50–1.35)
Calcium: 8.6 mg/dL (ref 8.4–10.5)
GFR calc Af Amer: 60 mL/min — ABNORMAL LOW (ref >90)
GFR calc non Af Amer: 52 mL/min — ABNORMAL LOW (ref >90)
Glucose, Bld: 187 mg/dL — ABNORMAL HIGH (ref 70–99)
POTASSIUM: 3.7 meq/L (ref 3.7–5.3)
Sodium: 145 mEq/L (ref 137–147)

## 2014-01-24 LAB — APTT: aPTT: 57 seconds — ABNORMAL HIGH (ref 24–37)

## 2014-01-24 SURGERY — EGD (ESOPHAGOGASTRODUODENOSCOPY)
Anesthesia: Moderate Sedation

## 2014-01-24 MED ORDER — SODIUM CHLORIDE 0.9 % IV SOLN
INTRAVENOUS | Status: DC
  Administered 2014-01-24: 500 mL via INTRAVENOUS

## 2014-01-24 MED ORDER — FENTANYL CITRATE 0.05 MG/ML IJ SOLN
INTRAMUSCULAR | Status: DC | PRN
  Administered 2014-01-24 (×2): 25 ug via INTRAVENOUS

## 2014-01-24 MED ORDER — MIDAZOLAM HCL 10 MG/2ML IJ SOLN
INTRAMUSCULAR | Status: AC
  Filled 2014-01-24: qty 4

## 2014-01-24 MED ORDER — GLUCAGON HCL RDNA (DIAGNOSTIC) 1 MG IJ SOLR
1.0000 mg | Freq: Once | INTRAMUSCULAR | Status: AC
  Administered 2014-01-24: 1 mg via INTRAMUSCULAR
  Filled 2014-01-24: qty 1

## 2014-01-24 MED ORDER — FENTANYL CITRATE 0.05 MG/ML IJ SOLN
INTRAMUSCULAR | Status: AC
  Filled 2014-01-24: qty 2

## 2014-01-24 MED ORDER — MIDAZOLAM HCL 10 MG/2ML IJ SOLN
INTRAMUSCULAR | Status: DC | PRN
  Administered 2014-01-24 (×2): 2 mg via INTRAVENOUS
  Administered 2014-01-24: 1 mg via INTRAVENOUS

## 2014-01-24 MED ORDER — DIPHENHYDRAMINE HCL 50 MG/ML IJ SOLN
INTRAMUSCULAR | Status: AC
  Filled 2014-01-24: qty 1

## 2014-01-24 MED ORDER — OMEPRAZOLE 20 MG PO CPDR
20.0000 mg | DELAYED_RELEASE_CAPSULE | Freq: Every day | ORAL | Status: DC
Start: 2014-01-24 — End: 2014-02-14

## 2014-01-24 NOTE — ED Notes (Signed)
Bed: DV76 Expected date:  Expected time:  Means of arrival:  Comments: Endoscopy at bedside

## 2014-01-24 NOTE — Telephone Encounter (Signed)
OK for direct EGD/dilation in [place] off Pradaxa as cleared by Dr Lovena Le

## 2014-01-24 NOTE — ED Notes (Signed)
Pt reports being unable to swallow his salivation and experiencing n/v since 6pm today - pt admits to hx of esophageal stricture requiring dilation - pt admits to foreign body sensation as well. Unable to tolerate PO - handling secretions well however has to continuously spit his saliva out.

## 2014-01-24 NOTE — Telephone Encounter (Signed)
Message copied by Marlon Pel on Thu Jan 24, 2014  2:16 PM ------      Message from: Owens Loffler P      Created: Thu Jan 24, 2014  3:06 AM       He had esopahgeal food impaction this AM.            Needs repeat EGD with Fuller Plan in next few weeks to dilate the stricture.  He takes pradaxa and that will need to be held if ok with prescribing MD.              Thanks       ------

## 2014-01-24 NOTE — ED Provider Notes (Signed)
CSN: [id number]     Arrival date & time 01/23/14  2215 History   First MD Initiated Contact with Patient 01/23/14 2311     Chief Complaint  Patient presents with  . Foreign Body     (Consider location/radiation/quality/duration/timing/severity/associated sxs/prior Treatment) HPI Comments: Pt comes in with cc of food impaction. Pt has hx of esophageal dysmotility and dysphagia before, with impaction as well. Reports having sensation that food is stuck while having dinner. Since then, he has pain with swallowing, and anything he ingests comes out. No emesis. No chest pain.  Patient is a 80 y.o. male presenting with foreign body. The history is provided by the patient.  Foreign Body Associated symptoms: no abdominal pain, no cough and no nausea     Past Medical History  Diagnosis Date  . Chronic systolic heart failure     EF 25-30% in past;  Echocardiogram 11/08/11: Normal LV wall thickness, EF 45-50%.  Marland Kitchen HTN (hypertension)   . History of diverticulitis of colon   . GERD (gastroesophageal reflux disease)   . History of nephrolithiasis   . Ischemic cardiomyopathy     s/p ICD 2008 (see CHF for EF)  . Urinary retention     foley indwelling 12/2010-04/2011  . Arthritis   . HLD (hyperlipidemia)   . Coronary artery disease     a. Chronically occ RCA. b. Last cardiac catheterization 10/2010: Mid LAD 20-30%, distal LAD 40%, mid D1 40%, mid circumflex 20-30%, distal RCA occluded with left to right collaterals.  . Benign prostatic hypertrophy   . TIA (transient ischemic attack) 10/2011    carotids neg for ICA stenosis  . Atrial fibrillation   . Skin cancer   . BPH (benign prostatic hyperplasia)   . Renal insufficiency     Renal failure in July 2012 secondary to hypotension, ARB use, and   . Esophageal stricture   . Hemorrhoids    Past Surgical History  Procedure Laterality Date  . Cardiac defibrillator placement  12/2006    AICD  . Total knee arthroplasty  01/04/11    L TKA  .  Cardiac catheterization  11-05-10  . Joint replacement    . Cataracts removed    . Transurethral resection of prostate  06/14/2011    Procedure: TRANSURETHRAL RESECTION OF THE PROSTATE (TURP);  Surgeon: Bernestine Amass, MD;  Location: [facility];  Service: Urology;  Laterality: N/A;   Family History  Problem Relation Age of Onset  . Heart disease Son     x 2  . Colon cancer Neg Hx    History  Substance Use Topics  . Smoking status: Former Smoker -- 2.00 packs/day for 30 years    Quit date: 06/14/1968  . Smokeless tobacco: Never Used  . Alcohol Use: No    Review of Systems  Constitutional: Negative for activity change and appetite change.  Respiratory: Negative for cough and shortness of breath.   Cardiovascular: Negative for chest pain.  Gastrointestinal: Negative for nausea and abdominal pain.  Genitourinary: Negative for dysuria.      Allergies  Penicillins  Home Medications   Prior to Admission medications   Medication Sig Start Date End Date Taking? Authorizing Provider  aspirin 325 MG tablet Take 325 mg by mouth daily.   Yes Historical Provider, MD  carvedilol (COREG) 6.25 MG tablet Take 18.75 mg by mouth 2 (two) times daily.   Yes Historical Provider, MD  dabigatran (PRADAXA) 150 MG CAPS capsule Take 150 mg by mouth every 12 (  twelve) hours.   Yes Historical Provider, MD  dutasteride (AVODART) 0.5 MG capsule Take 0.5 mg by mouth daily.   Yes Historical Provider, MD  finasteride (PROSCAR) 5 MG tablet Take 5 mg by mouth daily. 12/14/13  Yes Historical Provider, MD  fluticasone (FLONASE) 50 MCG/ACT nasal spray Place 2 sprays into both nostrils daily as needed for allergies or rhinitis.   Yes Historical Provider, MD  furosemide (LASIX) 20 MG tablet Take 20 mg by mouth.   Yes Historical Provider, MD  HYDROcodone-acetaminophen (NORCO) 5-325 MG per tablet Take 1 tablet by mouth every 6 (six) hours as needed. Pain    Yes Historical Provider, MD  isosorbide mononitrate (IMDUR) 30 MG  24 hr tablet Take 30 mg by mouth daily.   Yes Historical Provider, MD  NITROSTAT 0.4 MG SL tablet Place 0.4 mg under the tongue every 5 (five) minutes as needed. For chest pain 08/13/11  Yes Historical Provider, MD  omeprazole (PRILOSEC) 40 MG capsule Take 1 capsule (40 mg total) by mouth daily. 02/28/13  Yes Ladene Artist, MD  simvastatin (ZOCOR) 40 MG tablet TAKE 1 TABLET BY MOUTH AT BEDTIME 10/18/13  Yes Josue Hector, MD  solifenacin (VESICARE) 5 MG tablet Take 5 mg by mouth daily.    Yes Historical Provider, MD  omeprazole (PRILOSEC) 20 MG capsule Take 1 capsule (20 mg total) by mouth daily. 01/24/14   Jeffrie Lofstrom Kathrynn Humble, MD   BP 130/51  Pulse 59  Temp(Src) 98.5 F (36.9 C) (Oral)  Resp 18  Ht 5\' 8"  (1.727 m)  Wt 155 lb (70.308 kg)  BMI 23.57 kg/m2  SpO2 100% Physical Exam  Nursing note and vitals reviewed. Constitutional: He is oriented to person, place, and time. He appears well-developed.  HENT:  Head: Normocephalic and atraumatic.  Eyes: Conjunctivae and EOM are normal. Pupils are equal, round, and reactive to light.  Neck: Normal range of motion. Neck supple.  Cardiovascular: Normal rate and regular rhythm.   Pulmonary/Chest: Effort normal and breath sounds normal.  Abdominal: Soft. Bowel sounds are normal. He exhibits no distension. There is no tenderness. There is no rebound and no guarding.  Neurological: He is alert and oriented to person, place, and time.  Skin: Skin is warm.    ED Course  Procedures (including critical care time) Labs Review Labs Reviewed  CBC WITH DIFFERENTIAL - Abnormal; Notable for the following:    WBC 10.8 (*)    RBC 4.11 (*)    Hemoglobin 11.9 (*)    HCT 36.1 (*)    Neutrophils Relative % 81 (*)    Neutro Abs 8.7 (*)    All other components within normal limits  BASIC METABOLIC PANEL - Abnormal; Notable for the following:    Glucose, Bld 187 (*)    GFR calc non Af Amer 52 (*)    GFR calc Af Amer 60 (*)    All other components within normal  limits  APTT - Abnormal; Notable for the following:    aPTT 57 (*)    All other components within normal limits  PROTIME-INR - Abnormal; Notable for the following:    Prothrombin Time 17.5 (*)    All other components within normal limits    Imaging Review Dg Chest 2 View  01/24/2014   CLINICAL DATA:  Chest discomfort.  EXAM: CHEST  2 VIEW  COMPARISON:  Chest radiograph performed 02/22/2013  FINDINGS: The lungs are well-aerated and clear. There is no evidence of focal opacification, pleural effusion or  pneumothorax.  The heart is normal in size; the mediastinal contour is within normal limits. A pacemaker/AICD is noted at the left chest wall, with leads ending at the right atrium and right ventricle. No acute osseous abnormalities are seen.  IMPRESSION: No acute cardiopulmonary process seen.   Electronically Signed   By: Garald Balding M.D.   On: 01/24/2014 00:00     EKG Interpretation None      MDM   Final diagnoses:  Food impaction of esophagus, initial encounter    Pt comes in with food impaction.  We tried medical tx - none yielded any benefit.  We tried po challenge, and patient was unable to tolerate the tiny sips of water.  GI was consulted - and they removed the foreign body. Pt is alert, ambulated. Anesthesia effects have wore off. Will d.c.    Varney Biles, MD 01/24/14 0530

## 2014-01-24 NOTE — ED Notes (Signed)
Endoscopy team at bedside to perform procedure.

## 2014-01-24 NOTE — Telephone Encounter (Signed)
Discussed the recommendations with the patient's wife.  He is scheduled for [place] 02/25/14 1:30.  She is aware that we will be in contact once Dr. Lovena Le reviews for Pradaxa instructions.  Pre-visit scheduled for 02/14/14 2:00

## 2014-01-24 NOTE — ED Notes (Signed)
Pt sts he is ready to be discharged, he ambulated to bathroom without assistance, using cain. Pt is alert and oriented x 3. His speech is somewhat slurred, however, per pt's son that is his baseline. Dr Kathrynn Humble notified, awaiting discharge orders.

## 2014-01-24 NOTE — Telephone Encounter (Addendum)
Dr. Fuller Plan please review the procedure performed this am.  Ok to set up repeat EGD DIl direct?  [place] for the [facility]? His last EF in 2013 was 45-50%.   Letter sent to Dr. Lovena Le about Pradaxa   Dear Lovena Le,    We have scheduled the above patient for an endoscopic procedure. Our records show that he is on anticoagulation therapy.   Please advise as to how long the patient may come off his therapy of Pradaxa prior to and EGD with esophageal dilation.  Please fax back/ or route the completed form to Barb Merino, Therapist, sports.Marland Kitchen   Sincerely,    Barb Merino

## 2014-01-24 NOTE — H&P (Signed)
HPI: This is a man with acute dysphagia while eating dinner about 6 hours ago. Cannot manage his secretions.    Past Medical History  Diagnosis Date  . Chronic systolic heart failure     EF 25-30% in past;  Echocardiogram 11/08/11: Normal LV wall thickness, EF 45-50%.  Marland Kitchen HTN (hypertension)   . History of diverticulitis of colon   . GERD (gastroesophageal reflux disease)   . History of nephrolithiasis   . Ischemic cardiomyopathy     s/p ICD 2008 (see CHF for EF)  . Urinary retention     foley indwelling 12/2010-04/2011  . Arthritis   . HLD (hyperlipidemia)   . Coronary artery disease     a. Chronically occ RCA. b. Last cardiac catheterization 10/2010: Mid LAD 20-30%, distal LAD 40%, mid D1 40%, mid circumflex 20-30%, distal RCA occluded with left to right collaterals.  . Benign prostatic hypertrophy   . TIA (transient ischemic attack) 10/2011    carotids neg for ICA stenosis  . Atrial fibrillation   . Skin cancer   . BPH (benign prostatic hyperplasia)   . Renal insufficiency     Renal failure in July 2012 secondary to hypotension, ARB use, and   . Esophageal stricture   . Hemorrhoids     Past Surgical History  Procedure Laterality Date  . Cardiac defibrillator placement  12/2006    AICD  . Total knee arthroplasty  01/04/11    L TKA  . Cardiac catheterization  11-05-10  . Joint replacement    . Cataracts removed    . Transurethral resection of prostate  06/14/2011    Procedure: TRANSURETHRAL RESECTION OF THE PROSTATE (TURP);  Surgeon: Bernestine Amass, MD;  Location: [facility];  Service: Urology;  Laterality: N/A;    Current Facility-Administered Medications  Medication Dose Route Frequency Provider Last Rate Last Dose  . 0.9 %  sodium chloride infusion   Intravenous Continuous Milus Banister, MD 10 mL/hr at 01/24/14 0207 500 mL at 01/24/14 0207  . nitroGLYCERIN (NITROSTAT) SL tablet 0.4 mg  0.4 mg Sublingual Q5 min PRN Varney Biles, MD   0.4 mg at 01/23/14 2343     Allergies as of 01/23/2014 - Review Complete 01/23/2014  Allergen Reaction Noted  . Penicillins Itching, Swelling, and Rash 04/22/2008    Family History  Problem Relation Age of Onset  . Heart disease Son     x 2  . Colon cancer Neg Hx     History   Social History  . Marital Status: Married    Spouse Name: N/A    Number of Children: N/A  . Years of Education: N/A   Occupational History  . retired    Social History Main Topics  . Smoking status: Former Smoker -- 2.00 packs/day for 30 years    Quit date: 06/14/1968  . Smokeless tobacco: Never Used  . Alcohol Use: No  . Drug Use: No  . Sexual Activity: Not on file   Other Topics Concern  . Not on file   Social History Narrative   Daily caffeine       Physical Exam: BP 166/88  Pulse 56  Temp(Src) 98.5 F (36.9 C) (Oral)  Resp 17  Ht 5\' 8"  (1.727 m)  Wt 155 lb (70.308 kg)  BMI 23.57 kg/m2  SpO2 100% Constitutional: generally well-appearing Psychiatric: alert and oriented x3 Abdomen: soft, nontender, nondistended, no obvious ascites, no peritoneal signs, normal bowel sounds     Assessment and plan: 80 y.o.  male with esophageal food impaction  For EGD now.  He and his son understand that he is on blood thinner and that he is at higher risk for bleeding.

## 2014-01-24 NOTE — Op Note (Signed)
[facility] [place], [address]   ENDOSCOPY PROCEDURE REPORT  PATIENT: Jacob [name], Jacob [name]  MR#: [id number] BIRTHDATE: 07-23-1931 , 82  yrs. old GENDER: Male ENDOSCOPIST: Milus Banister, MD PROCEDURE DATE:  01/24/2014 PROCEDURE:  EGD w/ fb removal ASA CLASS:     Class III INDICATIONS:  acute dysphagia while eating, esophageal food impaction. MEDICATIONS: Fentanyl 50 mcg IV and Versed 6 mg IV TOPICAL ANESTHETIC: none  DESCRIPTION OF PROCEDURE: After the risks benefits and alternatives of the procedure were thoroughly explained, informed consent was obtained.  The Pentax Gastroscope [id number] endoscope was introduced through the mouth and advanced to the pylorus. Without limitations. The instrument was slowly withdrawn as the mucosa was fully examined.    There was a large food bolus in the distal esophagus.  This was removed, piecemeal with a Jabier Mutton Net until the esophagus was completely cleared.  It appeared to be meat (chicken).  The GE junction was strictured, inflammed, not dilated given his pradaxa. The lumen was 8-43mm through the stricture which appeared benign. The examination was otherwise normal.  Retroflexed views revealed no abnormalities.     The scope was then withdrawn from the patient and the procedure completed.  COMPLICATIONS: There were no complications. ENDOSCOPIC IMPRESSION: There was a large food bolus in the distal esophagus.  This was removed, piecemeal with a Jabier Mutton Net until the esophagus was completely cleared.  It appeared to be meat (chicken).  The GE junction was strictured, inflammed, not dilated given his pradaxa. The lumen was 8-10mm through the stricture which appeared benign. The examination was otherwise normal.  RECOMMENDATIONS: He will take his prilosec twice daily for now.  Chew his food well, eat slowly and take small bites. Our office will get in touch with him to schedule repeat EGD (holding pradaxa if OK  with the prescribing MD) in next few weeks to dilate the stricture.   eSigned:  Milus Banister, MD 01/24/2014 3:06 AM   OF:[id number] Fuller Plan, MD

## 2014-01-24 NOTE — Discharge Instructions (Signed)
See the GI doctor in the clinic as requested. Take the medication prescribed.  Return to the ER if there is increased pain, vomiting.  Swallowed Foreign Body, Adult You have swallowed an object (foreign body). Once the foreign body has passed through the food tube (esophagus), which leads from the mouth to the stomach, it will usually continue through the body without problems. This is because the point where the esophagus enters into the stomach is the narrowest place through which the foreign body must pass. Sometimes the foreign body gets stuck. The most common type of foreign body obstruction in adults is food impaction. Many times, bones from fish or meat products may become lodged in the esophagus or injure the throat on the way down. When there is an object that obstructs the esophagus, the most obvious symptoms are pain and the inability to swallow normally. In some cases, foreign bodies that can be life threatening are swallowed. Examples of these are certain medications and illicit drugs. Often in these instances, patients are afraid of telling what they swallowed. However, it is extremely important to tell the emergency caregiver what was swallowed because life-saving treatment may be needed.  X-ray exams may be taken to find the location of the foreign body. However, some objects do not show up well or may be too small to be seen on an X-ray image. If the foreign body is too large or too sharp, it may be too dangerous to allow it to pass on its own. You may need to see a caregiver who specializes in the digestive system (gastroenterologist). In a few cases, a specialist may need to remove the object using a method called "endoscopy". This involves passing a thin, soft, flexible tube into the food pipe to locate and remove the object. Follow up with your primary doctor or the referral you were given by the emergency caregiver. HOME CARE INSTRUCTIONS   If your caregiver says it is safe for you  to eat, then only have liquids and soft foods until your symptoms improve.  Once you are eating normally:  Cut food into small pieces.  Remove small bones from food.  Remove large seeds and pits from fruit.  Chew your food well.  Do not talk, laugh, or engage in physical activity while eating or swallowing. SEEK MEDICAL CARE IF:  You develop worsening shortness of breath, uncontrollable coughing, chest pains or high fever, greater than 102 F (38.9 C).  You are unable to eat or drink or you feel that food is getting stuck in your throat.  You have choking symptoms or cannot stop drooling.  You develop abdominal pain, vomiting (especially of blood), or rectal bleeding. MAKE SURE YOU:   Understand these instructions.  Will watch your condition.  Will get help right away if you are not doing well or get worse. Document Released: 11/18/2009 Document Revised: 08/23/2011 Document Reviewed: 11/18/2009 [facility] Patient Information 2015 [place], [place]. This information is not intended to replace advice given to you by your health care provider. Make sure you discuss any questions you have with your health care provider.

## 2014-01-25 ENCOUNTER — Encounter (HOSPITAL_COMMUNITY): Payer: Self-pay | Admitting: Gastroenterology

## 2014-01-28 NOTE — Telephone Encounter (Signed)
Please inform patient and coordinate hold of Pradaxa as recommended.

## 2014-01-28 NOTE — Telephone Encounter (Signed)
Patient informed per Dr. Lovena Le to hold Pradaxa 2 days prior to his procedure. Pt verbalized understanding.

## 2014-01-28 NOTE — Telephone Encounter (Signed)
Jacob, [name] may stop Pradaxa 2 days prior to your procedure. It is gone in 48 hours. I will defer the timing of restarting Pradaxa to you. The peak action occurs 4 hours after taking the medication. GT

## 2014-02-11 ENCOUNTER — Other Ambulatory Visit: Payer: Self-pay | Admitting: Internal Medicine

## 2014-02-14 ENCOUNTER — Ambulatory Visit: Payer: Medicare Other

## 2014-02-14 VITALS — Ht 68.5 in | Wt 170.0 lb

## 2014-02-14 DIAGNOSIS — R1310 Dysphagia, unspecified: Secondary | ICD-10-CM

## 2014-02-14 NOTE — Progress Notes (Signed)
No allergies to eggs or soy No past problems with anesthesia No home oxygen No diet/weight loss meds  No email

## 2014-03-05 ENCOUNTER — Encounter: Payer: Self-pay | Admitting: Gastroenterology

## 2014-03-05 ENCOUNTER — Ambulatory Visit (AMBULATORY_SURGERY_CENTER): Payer: Medicare Other | Admitting: Gastroenterology

## 2014-03-05 VITALS — BP 119/79 | HR 55 | Temp 97.1°F | Resp 17 | Ht 68.5 in | Wt 170.0 lb

## 2014-03-05 DIAGNOSIS — R1310 Dysphagia, unspecified: Secondary | ICD-10-CM | POA: Diagnosis not present

## 2014-03-05 DIAGNOSIS — N400 Enlarged prostate without lower urinary tract symptoms: Secondary | ICD-10-CM | POA: Diagnosis not present

## 2014-03-05 DIAGNOSIS — K222 Esophageal obstruction: Secondary | ICD-10-CM

## 2014-03-05 DIAGNOSIS — I10 Essential (primary) hypertension: Secondary | ICD-10-CM | POA: Diagnosis not present

## 2014-03-05 DIAGNOSIS — I2510 Atherosclerotic heart disease of native coronary artery without angina pectoris: Secondary | ICD-10-CM | POA: Diagnosis not present

## 2014-03-05 DIAGNOSIS — Z8673 Personal history of transient ischemic attack (TIA), and cerebral infarction without residual deficits: Secondary | ICD-10-CM | POA: Diagnosis not present

## 2014-03-05 DIAGNOSIS — K219 Gastro-esophageal reflux disease without esophagitis: Secondary | ICD-10-CM

## 2014-03-05 DIAGNOSIS — I428 Other cardiomyopathies: Secondary | ICD-10-CM | POA: Diagnosis not present

## 2014-03-05 DIAGNOSIS — I509 Heart failure, unspecified: Secondary | ICD-10-CM | POA: Diagnosis not present

## 2014-03-05 MED ORDER — SODIUM CHLORIDE 0.9 % IV SOLN: 500.0000 mL | INTRAVENOUS | Status: DC

## 2014-03-05 NOTE — Op Note (Signed)
[place]  [address] [place], [id number]   ENDOSCOPY PROCEDURE REPORT  PATIENT: [name], Jacob  MR#: [id number] BIRTHDATE: [birth year] , 82  yrs. old GENDER: male ENDOSCOPIST: Ladene Artist, MD, [facility] PROCEDURE DATE:  03/05/2014 PROCEDURE:  EGD w/ wire guided (savary) dilation ASA CLASS:     Class III INDICATIONS:  dysphagia.  esophageal stricture noted at recent EGD for food imaction. MEDICATIONS: Monitored anesthesia care and Propofol 60 mg TOPICAL ANESTHETIC:  DESCRIPTION OF PROCEDURE: After the risks benefits and alternatives of the procedure were thoroughly explained, informed consent was obtained.  The LB [id number] endoscope was introduced through the mouth and advanced to the second portion of the duodenum , limited by Without limitations. The instrument was slowly withdrawn as the mucosa was fully examined. Images did not capture.      ESOPHAGUS: The esophagus and gastroesophageal junction were completely normal in appearance. No esophageal stricture was noted. The stomach was entered and closely examined.The antrum, angularis, and lesser curvature were well visualized, including a retroflexed view of the cardia and fundus.  The stomach wall was normally distensable.  The scope passed easily through the pylorus into the duodenum. Retroflexed views revealed no abnormalities. A guidewire was placed and the scope was then withdrawn from the patient. 16 and 17 mm dilators were passed with minimal resistance to the second dilator. No heme noted and the procedure completed.  COMPLICATIONS: There were no complications.  ENDOSCOPIC IMPRESSION: 1.  Normal appearing EGD  RECOMMENDATIONS: 1.  Anti-reflux regimen long term 2.  Continue PPI daily long term 3.  Post dilation instructions 4.  Resume Pradaxa tomorrow    eSigned:  Ladene Artist, MD, [facility] 03/05/2014 1:48 PM

## 2014-03-05 NOTE — Patient Instructions (Signed)
Impressions/recommendations:  Normal EGD  Post dilation instructions (handout given)  Resume Pradaxa tomorrow  YOU HAD AN ENDOSCOPIC PROCEDURE TODAY AT [place]: Refer to the procedure report that was given to you for any specific questions about what was found during the examination.  If the procedure report does not answer your questions, please call your gastroenterologist to clarify.  If you requested that your care partner not be given the details of your procedure findings, then the procedure report has been included in a sealed envelope for you to review at your convenience later.  YOU SHOULD EXPECT: Some feelings of bloating in the abdomen. Passage of more gas than usual.  Walking can help get rid of the air that was put into your GI tract during the procedure and reduce the bloating. If you had a lower endoscopy (such as a colonoscopy or flexible sigmoidoscopy) you may notice spotting of blood in your stool or on the toilet paper. If you underwent a bowel prep for your procedure, then you may not have a normal bowel movement for a few days.  DIET: Your first meal following the procedure should be a light meal and then it is ok to progress to your normal diet.  A half-sandwich or bowl of soup is an example of a good first meal.  Heavy or fried foods are harder to digest and may make you feel nauseous or bloated.  Likewise meals heavy in dairy and vegetables can cause extra gas to form and this can also increase the bloating.  Drink plenty of fluids but you should avoid alcoholic beverages for 24 hours.  ACTIVITY: Your care partner should take you home directly after the procedure.  You should plan to take it easy, moving slowly for the rest of the day.  You can resume normal activity the day after the procedure however you should NOT DRIVE or use heavy machinery for 24 hours (because of the sedation medicines used during the test).    SYMPTOMS TO REPORT IMMEDIATELY: A  gastroenterologist can be reached at any hour.  During normal business hours, 8:30 AM to 5:00 PM Monday through Friday, call [phone].  After hours and on weekends, please call the GI answering service at [phone] who will take a message and have the physician on call contact you.   Following upper endoscopy (EGD)  Vomiting of blood or coffee ground material  New chest pain or pain under the shoulder blades  Painful or persistently difficult swallowing  New shortness of breath  Fever of 100F or higher  Black, tarry-looking stools  FOLLOW UP: If any biopsies were taken you will be contacted by phone or by letter within the next 1-3 weeks.  Call your gastroenterologist if you have not heard about the biopsies in 3 weeks.  Our staff will call the home number listed on your records the next business day following your procedure to check on you and address any questions or concerns that you may have at that time regarding the information given to you following your procedure. This is a courtesy call and so if there is no answer at the home number and we have not heard from you through the emergency physician on call, we will assume that you have returned to your regular daily activities without incident.  SIGNATURES/CONFIDENTIALITY: You and/or your care partner have signed paperwork which will be entered into your electronic medical record.  These signatures attest to the fact that that the information  above on your After Visit Summary has been reviewed and is understood.  Full responsibility of the confidentiality of this discharge information lies with you and/or your care-partner. 

## 2014-03-05 NOTE — Progress Notes (Signed)
Stable to RR 

## 2014-03-05 NOTE — Progress Notes (Signed)
Called to room to assist during endoscopic procedure.  Patient ID and intended procedure confirmed with present staff. Received instructions for my participation in the procedure from the performing physician.  

## 2014-03-05 NOTE — Progress Notes (Signed)
Patient is a poor historian regarding meds,

## 2014-03-06 ENCOUNTER — Telehealth: Payer: Self-pay | Admitting: *Deleted

## 2014-03-06 NOTE — Telephone Encounter (Signed)
  Follow up Call-  Call back number 03/05/2014  Post procedure Call Back phone  # [phone]  Permission to leave phone message No     No answer, "mail box full", unable to leave message.

## 2014-03-18 ENCOUNTER — Ambulatory Visit (INDEPENDENT_AMBULATORY_CARE_PROVIDER_SITE_OTHER): Payer: Medicare Other | Admitting: *Deleted

## 2014-03-18 ENCOUNTER — Encounter: Payer: Self-pay | Admitting: Internal Medicine

## 2014-03-18 DIAGNOSIS — I429 Cardiomyopathy, unspecified: Secondary | ICD-10-CM

## 2014-03-18 DIAGNOSIS — Z95810 Presence of automatic (implantable) cardiac defibrillator: Secondary | ICD-10-CM | POA: Diagnosis not present

## 2014-03-18 LAB — MDC_IDC_ENUM_SESS_TYPE_INCLINIC
Brady Statistic RV Percent Paced: 0 %
HighPow Impedance: 42 Ohm
Lead Channel Impedance Value: 598 Ohm
Lead Channel Pacing Threshold Amplitude: 0.6 V
Lead Channel Pacing Threshold Pulse Width: 0.4 ms
Lead Channel Pacing Threshold Pulse Width: 0.6 ms
Lead Channel Sensing Intrinsic Amplitude: 3.9 mV
Lead Channel Setting Pacing Amplitude: 2 V
Lead Channel Setting Pacing Pulse Width: 0.4 ms
MDC IDC MSMT LEADCHNL RA PACING THRESHOLD AMPLITUDE: 1 V
MDC IDC MSMT LEADCHNL RA SENSING INTR AMPL: 1.8 mV
MDC IDC MSMT LEADCHNL RV IMPEDANCE VALUE: 788 Ohm
MDC IDC PG SERIAL: 504434
MDC IDC SET LEADCHNL RV PACING AMPLITUDE: 2.4 V
MDC IDC SET ZONE DETECTION INTERVAL: 333.33 ms
MDC IDC STAT BRADY RA PERCENT PACED: 25 %
Zone Setting Detection Interval: 285.71 ms

## 2014-03-18 NOTE — Progress Notes (Signed)
ICD check in clinic. Normal device function. Thresholds and sensing consistent with previous device measurements. Impedance trends stable over time. No evidence of any ventricular arrhythmias. No mode switches + pradaxa. Histogram distribution appropriate for patient and level of activity. No changes made this session. Device programmed at appropriate safety margins. Device programmed to optimize intrinsic conduction. Battery @MOL  1, 2.85V. ROV w/ device clinic 06/17/14 & w/ Dr. Lovena Le in 46mo.

## 2014-03-21 DIAGNOSIS — M5136 Other intervertebral disc degeneration, lumbar region: Secondary | ICD-10-CM | POA: Diagnosis not present

## 2014-03-21 DIAGNOSIS — M1711 Unilateral primary osteoarthritis, right knee: Secondary | ICD-10-CM | POA: Diagnosis not present

## 2014-03-21 DIAGNOSIS — Z79891 Long term (current) use of opiate analgesic: Secondary | ICD-10-CM | POA: Diagnosis not present

## 2014-03-21 DIAGNOSIS — G894 Chronic pain syndrome: Secondary | ICD-10-CM | POA: Diagnosis not present

## 2014-03-23 ENCOUNTER — Other Ambulatory Visit: Payer: Self-pay | Admitting: Gastroenterology

## 2014-03-23 ENCOUNTER — Other Ambulatory Visit: Payer: Self-pay | Admitting: Cardiovascular Disease

## 2014-05-27 ENCOUNTER — Other Ambulatory Visit: Payer: Self-pay | Admitting: Internal Medicine

## 2014-05-27 ENCOUNTER — Other Ambulatory Visit: Payer: Self-pay | Admitting: Gastroenterology

## 2014-06-04 DIAGNOSIS — R351 Nocturia: Secondary | ICD-10-CM | POA: Diagnosis not present

## 2014-06-04 DIAGNOSIS — N401 Enlarged prostate with lower urinary tract symptoms: Secondary | ICD-10-CM | POA: Diagnosis not present

## 2014-06-17 ENCOUNTER — Ambulatory Visit (INDEPENDENT_AMBULATORY_CARE_PROVIDER_SITE_OTHER): Payer: Medicare Other | Admitting: *Deleted

## 2014-06-17 DIAGNOSIS — I429 Cardiomyopathy, unspecified: Secondary | ICD-10-CM | POA: Diagnosis not present

## 2014-06-17 DIAGNOSIS — I255 Ischemic cardiomyopathy: Secondary | ICD-10-CM

## 2014-06-17 DIAGNOSIS — Z95810 Presence of automatic (implantable) cardiac defibrillator: Secondary | ICD-10-CM

## 2014-06-17 LAB — MDC_IDC_ENUM_SESS_TYPE_INCLINIC
Brady Statistic RA Percent Paced: 21 %
Brady Statistic RV Percent Paced: 0 %
Implantable Pulse Generator Serial Number: 504434
Lead Channel Pacing Threshold Amplitude: 0.8 V
Lead Channel Pacing Threshold Amplitude: 1 V
Lead Channel Pacing Threshold Pulse Width: 0.4 ms
Lead Channel Sensing Intrinsic Amplitude: 3.5 mV
Lead Channel Setting Pacing Amplitude: 2 V
Lead Channel Setting Pacing Amplitude: 2.4 V
MDC IDC MSMT LEADCHNL RA IMPEDANCE VALUE: 569 Ohm
MDC IDC MSMT LEADCHNL RA PACING THRESHOLD PULSEWIDTH: 0.4 ms
MDC IDC MSMT LEADCHNL RA SENSING INTR AMPL: 2.1 mV
MDC IDC MSMT LEADCHNL RV IMPEDANCE VALUE: 740 Ohm
MDC IDC SET LEADCHNL RV PACING PULSEWIDTH: 0.4 ms
MDC IDC SET ZONE DETECTION INTERVAL: 285.71 ms
MDC IDC SET ZONE DETECTION INTERVAL: 333.33 ms

## 2014-06-17 NOTE — Progress Notes (Signed)
ICD check in clinic. Normal device function. Thresholds and sensing consistent with previous device measurements. Impedance trends stable over time. No evidence of any ventricular arrhythmias. 1 mode switch episode lasting 1 second. Histogram distribution appropriate for patient and level of activity. No changes made this session. Device programmed at appropriate safety margins. Device programmed to optimize intrinsic conduction. Battery voltage 2.83 V. ROV in 3 mths w/GT.

## 2014-07-10 ENCOUNTER — Encounter: Payer: Self-pay | Admitting: Internal Medicine

## 2014-07-17 ENCOUNTER — Other Ambulatory Visit: Payer: Self-pay | Admitting: Internal Medicine

## 2014-09-10 ENCOUNTER — Other Ambulatory Visit: Payer: Self-pay | Admitting: Internal Medicine

## 2014-09-10 ENCOUNTER — Other Ambulatory Visit: Payer: Self-pay | Admitting: Cardiovascular Disease

## 2014-09-12 NOTE — Telephone Encounter (Signed)
Please advise on refill of Statin pt last appt with you 09/2013.

## 2014-09-16 ENCOUNTER — Other Ambulatory Visit: Payer: Self-pay | Admitting: Internal Medicine

## 2014-10-08 ENCOUNTER — Encounter: Payer: Self-pay | Admitting: Internal Medicine

## 2014-10-08 ENCOUNTER — Ambulatory Visit (INDEPENDENT_AMBULATORY_CARE_PROVIDER_SITE_OTHER): Payer: Medicare Other | Admitting: Internal Medicine

## 2014-10-08 VITALS — BP 156/70 | HR 77 | Ht 67.5 in | Wt 175.6 lb

## 2014-10-08 DIAGNOSIS — I255 Ischemic cardiomyopathy: Secondary | ICD-10-CM

## 2014-10-08 DIAGNOSIS — Z95810 Presence of automatic (implantable) cardiac defibrillator: Secondary | ICD-10-CM

## 2014-10-08 DIAGNOSIS — I2510 Atherosclerotic heart disease of native coronary artery without angina pectoris: Secondary | ICD-10-CM

## 2014-10-08 DIAGNOSIS — I482 Chronic atrial fibrillation, unspecified: Secondary | ICD-10-CM

## 2014-10-08 DIAGNOSIS — I429 Cardiomyopathy, unspecified: Secondary | ICD-10-CM

## 2014-10-08 LAB — MDC_IDC_ENUM_SESS_TYPE_INCLINIC
Brady Statistic RA Percent Paced: 17 %
Brady Statistic RV Percent Paced: 1 %
Implantable Pulse Generator Serial Number: 504434
Lead Channel Impedance Value: 814 Ohm
Lead Channel Pacing Threshold Amplitude: 0.8 V
Lead Channel Pacing Threshold Amplitude: 1 V
Lead Channel Pacing Threshold Pulse Width: 0.4 ms
Lead Channel Pacing Threshold Pulse Width: 0.8 ms
Lead Channel Sensing Intrinsic Amplitude: 5.3 mV
Lead Channel Setting Pacing Amplitude: 2 V
Lead Channel Setting Pacing Amplitude: 2.4 V
MDC IDC MSMT LEADCHNL RA IMPEDANCE VALUE: 562 Ohm
MDC IDC MSMT LEADCHNL RA SENSING INTR AMPL: 2.1 mV
MDC IDC SET LEADCHNL RV PACING PULSEWIDTH: 0.4 ms
Zone Setting Detection Interval: 285.71 ms
Zone Setting Detection Interval: 333.33 ms

## 2014-10-08 NOTE — Assessment & Plan Note (Signed)
His [employer] ICD is working normally. We'll plan to recheck in several months. 

## 2014-10-08 NOTE — Assessment & Plan Note (Signed)
He is asymptomatic. His ventricular rate is fairly well controlled. He will continue systemic anticoagulation.

## 2014-10-08 NOTE — Assessment & Plan Note (Signed)
He denies anginal symptoms. He'll continue his current medications.

## 2014-10-08 NOTE — Progress Notes (Signed)
HPI Jacob [name] returns today for followup. He is a very pleasant 81 year old man with an ischemic cardiomyopathy, chronic systolic heart failure, class II, ventricular tachycardia, and atrial fibrillation, status post ICD implantation. The patient has been well-controlled from his atrial fibrillation on medical therapy and he denies chest pain, or shortness of breath. No peripheral edema. He does admit to some sedentary lifestyle. Allergies  Allergen Reactions  . Penicillins Itching, Swelling and Rash     Current Outpatient Prescriptions  Medication Sig Dispense Refill  . aspirin 325 MG tablet Take 325 mg by mouth daily. Pt states he only takes this sometimes (10/08/14)    . carvedilol (COREG) 6.25 MG tablet Take 18.75 mg by mouth 2 (two) times daily.    . dabigatran (PRADAXA) 150 MG CAPS capsule Take 1 capsule (150 mg total) by mouth 2 (two) times daily. 180 capsule 1  . finasteride (PROSCAR) 5 MG tablet Take 5 mg by mouth daily.    . fluticasone (FLONASE) 50 MCG/ACT nasal spray Place 2 sprays into both nostrils daily as needed for allergies or rhinitis.    . furosemide (LASIX) 20 MG tablet Take 20 mg by mouth daily.     Marland Kitchen HYDROcodone-acetaminophen (NORCO) 5-325 MG per tablet Take 1 tablet by mouth every 6 (six) hours as needed. Pain     . isosorbide mononitrate (IMDUR) 30 MG 24 hr tablet Take 1 tablet (30 mg total) by mouth daily. 30 tablet 5  . NITROSTAT 0.4 MG SL tablet Place 0.4 mg under the tongue every 5 (five) minutes as needed. For chest pain    . omeprazole (PRILOSEC) 40 MG capsule TAKE ONE CAPSULE BY MOUTH EVERY DAY 30 capsule 6  . simvastatin (ZOCOR) 40 MG tablet TAKE 1 TABLET BY MOUTH AT BEDTIME 15 tablet 0  . solifenacin (VESICARE) 5 MG tablet Take 5 mg by mouth daily.      No current facility-administered medications for this visit.     Past Medical History  Diagnosis Date  . Chronic systolic heart failure     EF 25-30% in past;  Echocardiogram 11/08/11: Normal LV wall  thickness, EF 45-50%.  Marland Kitchen HTN (hypertension)   . History of diverticulitis of colon   . GERD (gastroesophageal reflux disease)   . History of nephrolithiasis   . Ischemic cardiomyopathy     s/p ICD 2008 (see CHF for EF)  . Urinary retention     foley indwelling 12/2010-04/2011  . Arthritis   . HLD (hyperlipidemia)   . Coronary artery disease     a. Chronically occ RCA. b. Last cardiac catheterization 10/2010: Mid LAD 20-30%, distal LAD 40%, mid D1 40%, mid circumflex 20-30%, distal RCA occluded with left to right collaterals.  . Benign prostatic hypertrophy   . TIA (transient ischemic attack) 10/2011    carotids neg for ICA stenosis  . Atrial fibrillation   . Skin cancer   . BPH (benign prostatic hyperplasia)   . Renal insufficiency     Renal failure in July 2012 secondary to hypotension, ARB use, and   . Esophageal stricture   . Hemorrhoids     ROS:   All systems reviewed and negative except as noted in the HPI.   Past Surgical History  Procedure Laterality Date  . Cardiac defibrillator placement  12/2006    AICD  . Total knee arthroplasty  01/04/11    L TKA  . Cardiac catheterization  11-05-10  . Joint replacement    . Cataracts removed    .  Transurethral resection of prostate  06/14/2011    Procedure: TRANSURETHRAL RESECTION OF THE PROSTATE (TURP);  Surgeon: Bernestine Amass, MD;  Location: [facility];  Service: Urology;  Laterality: N/A;  . Esophagogastroduodenoscopy N/A 01/24/2014    Procedure: ESOPHAGOGASTRODUODENOSCOPY (EGD);  Surgeon: Milus Banister, MD;  Location: [facility];  Service: Endoscopy;  Laterality: N/A;     Family History  Problem Relation Age of Onset  . Heart disease Son     x 2  . Colon cancer Neg Hx   . Heart attack Mother 58    pt unsure of age  . Liver disease Father 44     History   Social History  . Marital Status: Married    Spouse Name: N/A  . Number of Children: N/A  . Years of Education: N/A   Occupational History  . retired     Social History Main Topics  . Smoking status: Former Smoker -- 2.00 packs/day for 30 years    Quit date: 06/14/1968  . Smokeless tobacco: Never Used  . Alcohol Use: No  . Drug Use: No  . Sexual Activity: Not on file   Other Topics Concern  . Not on file   Social History Narrative   Daily caffeine      BP 156/70 mmHg  Pulse 77  Ht 5' 7.5" (1.715 m)  Wt 175 lb 9.6 oz (79.652 kg)  BMI 27.08 kg/m2  Physical Exam:  Well appearing 81 year old man, NAD HEENT: Unremarkable Neck:  6 cm JVD, no thyromegally Lungs:  Clear except for scattered basilar rales, no wheezes, no rhonchi. HEART:  IRegular rate rhythm, no murmurs, no rubs, no clicks Abd:  soft, positive bowel sounds, no organomegally, no rebound, no guarding Ext:  2 plus pulses, no edema, no cyanosis, no clubbing Skin:  No rashes no nodules Neuro:  CN II through XII intact, motor grossly intact   DEVICE  Normal device function.  See PaceArt for details.   Assess/Plan:

## 2014-10-08 NOTE — Patient Instructions (Signed)
Medication Instructions:  Your physician recommends that you continue on your current medications as directed. Please refer to the Current Medication list given to you today.   Labwork: None ordered  Testing/Procedures: None ordered  Follow-Up: Your physician recommends that you schedule a follow-up appointment in: 3 months with device clinic and 12 months with Dr Lovena Le    Any Other Special Instructions Will Be Listed Below (If Applicable).

## 2014-10-27 ENCOUNTER — Other Ambulatory Visit: Payer: Self-pay | Admitting: Internal Medicine

## 2015-01-08 ENCOUNTER — Ambulatory Visit (INDEPENDENT_AMBULATORY_CARE_PROVIDER_SITE_OTHER): Payer: Medicare Other | Admitting: *Deleted

## 2015-01-08 DIAGNOSIS — I429 Cardiomyopathy, unspecified: Secondary | ICD-10-CM

## 2015-01-08 DIAGNOSIS — I480 Paroxysmal atrial fibrillation: Secondary | ICD-10-CM

## 2015-01-08 LAB — CUP PACEART INCLINIC DEVICE CHECK
Brady Statistic RA Percent Paced: 25 %
Brady Statistic RV Percent Paced: 0 %
Lead Channel Impedance Value: 569 Ohm
Lead Channel Pacing Threshold Amplitude: 0.8 V
Lead Channel Pacing Threshold Amplitude: 1 V
Lead Channel Setting Pacing Amplitude: 2 V
Lead Channel Setting Pacing Amplitude: 2.4 V
Lead Channel Setting Pacing Pulse Width: 0.4 ms
MDC IDC MSMT LEADCHNL RA PACING THRESHOLD PULSEWIDTH: 0.8 ms
MDC IDC MSMT LEADCHNL RA SENSING INTR AMPL: 1.5 mV
MDC IDC MSMT LEADCHNL RV IMPEDANCE VALUE: 801 Ohm
MDC IDC MSMT LEADCHNL RV PACING THRESHOLD PULSEWIDTH: 0.4 ms
MDC IDC MSMT LEADCHNL RV SENSING INTR AMPL: 4.4 mV
MDC IDC PG SERIAL: 504434
MDC IDC SESS DTM: 20160727151553
MDC IDC SET ZONE DETECTION INTERVAL: 285.71 ms
MDC IDC SET ZONE DETECTION INTERVAL: 333.33 ms

## 2015-01-08 NOTE — Progress Notes (Signed)
ICD check in clinic Battery voltage 2.76 V 2 mode switch episodes--longest was 10 minutes 16 seconds + Pradaxa No changes made ROV in 3 mths w/device clinic

## 2015-01-22 ENCOUNTER — Encounter: Payer: Self-pay | Admitting: Internal Medicine

## 2015-02-19 ENCOUNTER — Other Ambulatory Visit: Payer: Self-pay | Admitting: Internal Medicine

## 2015-04-10 ENCOUNTER — Ambulatory Visit (INDEPENDENT_AMBULATORY_CARE_PROVIDER_SITE_OTHER): Payer: Medicare Other | Admitting: *Deleted

## 2015-04-10 DIAGNOSIS — I429 Cardiomyopathy, unspecified: Secondary | ICD-10-CM

## 2015-04-10 DIAGNOSIS — Z95810 Presence of automatic (implantable) cardiac defibrillator: Secondary | ICD-10-CM | POA: Diagnosis not present

## 2015-04-10 DIAGNOSIS — I509 Heart failure, unspecified: Secondary | ICD-10-CM | POA: Diagnosis not present

## 2015-04-10 LAB — CUP PACEART INCLINIC DEVICE CHECK
Brady Statistic RA Percent Paced: 17 %
Brady Statistic RV Percent Paced: 0 %
HighPow Impedance: 41 Ohm
Implantable Lead Implant Date: 20080716
Implantable Lead Implant Date: 20080716
Implantable Lead Location: 753859
Implantable Lead Model: 158
Implantable Lead Model: 4087
Implantable Lead Serial Number: 281722
Lead Channel Impedance Value: 583 Ohm
Lead Channel Impedance Value: 668 Ohm
Lead Channel Pacing Threshold Amplitude: 1 V
Lead Channel Pacing Threshold Pulse Width: 0.4 ms
Lead Channel Pacing Threshold Pulse Width: 0.8 ms
Lead Channel Sensing Intrinsic Amplitude: 3.9 mV
Lead Channel Setting Pacing Amplitude: 2 V
Lead Channel Setting Pacing Pulse Width: 0.4 ms
MDC IDC LEAD LOCATION: 753860
MDC IDC LEAD SERIAL: 174941
MDC IDC MSMT BATTERY VOLTAGE: 2.69 V
MDC IDC MSMT LEADCHNL RA PACING THRESHOLD AMPLITUDE: 1 V
MDC IDC MSMT LEADCHNL RA SENSING INTR AMPL: 1.1 mV
MDC IDC SESS DTM: 20161027153139
MDC IDC SET LEADCHNL RV PACING AMPLITUDE: 2.4 V
Pulse Gen Serial Number: 504434

## 2015-04-10 NOTE — Progress Notes (Signed)
ICD check in clinic. Normal device function. Thresholds and sensing consistent with previous device measurements. Impedance trends stable over time. No evidence of any ventricular arrhythmias. No mode switches. Histogram distribution appropriate for patient and level of activity. No changes made this session. Device programmed at appropriate safety margins. Device programmed to optimize intrinsic conduction. Remaining power 2.69V. ROV w/ device clinic 07/10/15 & w/ GT 4/17.

## 2015-04-11 ENCOUNTER — Other Ambulatory Visit: Payer: Self-pay | Admitting: Internal Medicine

## 2015-04-12 ENCOUNTER — Other Ambulatory Visit: Payer: Self-pay | Admitting: Internal Medicine

## 2015-05-27 ENCOUNTER — Encounter: Payer: Self-pay | Admitting: Internal Medicine

## 2015-06-03 ENCOUNTER — Other Ambulatory Visit: Payer: Self-pay | Admitting: Internal Medicine

## 2015-06-04 NOTE — Telephone Encounter (Signed)
Spoke to pt and pt son. Both agreed to the 07/07/2015 appt at 1:30pm to establish with [facility]. Burns.

## 2015-06-04 NOTE — Telephone Encounter (Signed)
LOV: 2014  Ok to rf rx? Please advise

## 2015-06-04 NOTE — Telephone Encounter (Signed)
Ok to refill - please schedule pt with new PCP for ongoing care

## 2015-07-07 ENCOUNTER — Other Ambulatory Visit: Payer: Self-pay | Admitting: Internal Medicine

## 2015-07-07 ENCOUNTER — Encounter: Payer: Self-pay | Admitting: Internal Medicine

## 2015-07-07 ENCOUNTER — Ambulatory Visit (INDEPENDENT_AMBULATORY_CARE_PROVIDER_SITE_OTHER): Payer: Medicare Other | Admitting: Internal Medicine

## 2015-07-07 VITALS — BP 150/72 | HR 70 | Temp 97.7°F | Resp 16 | Wt 180.0 lb

## 2015-07-07 DIAGNOSIS — F32A Depression, unspecified: Secondary | ICD-10-CM | POA: Insufficient documentation

## 2015-07-07 DIAGNOSIS — K219 Gastro-esophageal reflux disease without esophagitis: Secondary | ICD-10-CM | POA: Diagnosis not present

## 2015-07-07 DIAGNOSIS — I2510 Atherosclerotic heart disease of native coronary artery without angina pectoris: Secondary | ICD-10-CM | POA: Diagnosis not present

## 2015-07-07 DIAGNOSIS — I480 Paroxysmal atrial fibrillation: Secondary | ICD-10-CM

## 2015-07-07 DIAGNOSIS — E7800 Pure hypercholesterolemia, unspecified: Secondary | ICD-10-CM

## 2015-07-07 DIAGNOSIS — I10 Essential (primary) hypertension: Secondary | ICD-10-CM | POA: Diagnosis not present

## 2015-07-07 DIAGNOSIS — F329 Major depressive disorder, single episode, unspecified: Secondary | ICD-10-CM | POA: Insufficient documentation

## 2015-07-07 MED ORDER — DABIGATRAN ETEXILATE MESYLATE 150 MG PO CAPS: 150.0000 mg | ORAL_CAPSULE | Freq: Two times a day (BID) | ORAL | Status: AC

## 2015-07-07 MED ORDER — CARVEDILOL 6.25 MG PO TABS: 6.2500 mg | ORAL_TABLET | Freq: Two times a day (BID) | ORAL | Status: DC

## 2015-07-07 MED ORDER — DULOXETINE HCL 20 MG PO CPEP: 20.0000 mg | ORAL_CAPSULE | Freq: Every day | ORAL | Status: DC

## 2015-07-07 MED ORDER — ISOSORBIDE MONONITRATE ER 30 MG PO TB24: 30.0000 mg | ORAL_TABLET | Freq: Every day | ORAL | Status: DC

## 2015-07-07 MED ORDER — FLUTICASONE PROPIONATE 50 MCG/ACT NA SUSP: 2.0000 | Freq: Every day | NASAL | Status: DC | PRN

## 2015-07-07 MED ORDER — SIMVASTATIN 40 MG PO TABS: 40.0000 mg | ORAL_TABLET | Freq: Every day | ORAL | Status: DC

## 2015-07-07 MED ORDER — FINASTERIDE 5 MG PO TABS: 5.0000 mg | ORAL_TABLET | Freq: Every day | ORAL | Status: DC

## 2015-07-07 NOTE — Assessment & Plan Note (Signed)
Controlled.   -Continue current medication

## 2015-07-07 NOTE — Progress Notes (Addendum)
Subjective:    Patient ID: Jacob [name], male    DOB: [birth year], 82 y.o.   MRN: [id number]  HPI He is here to establish with a new pcp.    He is here with his son.  His wife died in 10-Jun-2023.    PND: He has a lot of drainage in his throat and spits all night long.  He noticed it mostly when he lays down.  He uses flonase and it helps a little.  It does cause him to cough at times.  He has had this for years.    Depression: His wife died last month.  He does feel depressed and sad.  He denies anxiety. He has never been on an anti-depressant but would like to try one.  His wife was on cymbalta - so is his son.    Afib, CAD, htn, high cholesterol:  He is taking his medication daily.  He follows with cardiology. He does experience some chest pain at times, but feels it is related to his heartburn. The chest pain is atypical. He has not discussed this with cardiology.  His blood pressure is typically controlled.  He does not monitor it regularly at home.    Knee arthritis:  He had a left knee replacement and needs a right knee replacemeent.  He takes the pain meddication for generalized arthritis and back pain.  He wants to hold off on surgery if he can.    GERD:  He is taking his medication daily as prescribed.  He denies any GERD symptoms and feels his GERD is well controlled.    Medications and allergies reviewed with patient and updated if appropriate.  Patient Active Problem List   Diagnosis Date Noted  . Esophageal obstruction due to food impaction 01/24/2014  . Syncope 02/22/2013  . Cardiomyopathy ([place]) 02/22/2013  . CAD (coronary artery disease) 02/22/2013  . GERD (gastroesophageal reflux disease) 02/22/2013  . OA (osteoarthritis) of knee 02/22/2013  . TIA (transient ischemic attack) 11/07/2011  . Urinary retention 04/13/2011  . NEVUS, ATYPICAL 08/26/2010  . ECZEMA 05/27/2010  . Atrial fibrillation ([place]) 04/29/2010  . ROTATOR CUFF INJURY, RIGHT SHOULDER 09/16/2009  .  HYPERTENSION 04/23/2009  . BENIGN PROSTATIC HYPERTROPHY, WITH OBSTRUCTION 04/23/2009  . ARTHRITIS 04/23/2009  . HYPERCHOLESTEROLEMIA, MIXED 04/22/2008  . CORONARY ATHEROSCLEROSIS, NATIVE VESSEL 04/22/2008  . CHF 04/22/2008  . Automatic implantable cardioverter-defibrillator in situ 04/22/2008    Current Outpatient Prescriptions on File Prior to Visit  Medication Sig Dispense Refill  . aspirin 325 MG tablet Take 325 mg by mouth daily. Pt states he only takes this sometimes (10/08/14)    . carvedilol (COREG) 6.25 MG tablet Take 1 tablet (6.25 mg total) by mouth 2 (two) times daily with a meal. 540 tablet 1  . dabigatran (PRADAXA) 150 MG CAPS capsule Take 1 capsule (150 mg total) by mouth 2 (two) times daily. 180 capsule 1  . finasteride (PROSCAR) 5 MG tablet Take 5 mg by mouth daily.    . fluticasone (FLONASE) 50 MCG/ACT nasal spray Place 2 sprays into both nostrils daily as needed for allergies or rhinitis.    . furosemide (LASIX) 20 MG tablet Take 20 mg by mouth daily.     . isosorbide mononitrate (IMDUR) 30 MG 24 hr tablet Take 1 tablet (30 mg total) by mouth daily. Must establish with NEW PCP for additional refills. 90 tablet 0  . NITROSTAT 0.4 MG SL tablet Place 0.4 mg under the tongue every 5 (five)  minutes as needed. For chest pain    . omeprazole (PRILOSEC) 40 MG capsule TAKE ONE CAPSULE BY MOUTH EVERY DAY 30 capsule 6  . solifenacin (VESICARE) 5 MG tablet Take 5 mg by mouth daily.      No current facility-administered medications on file prior to visit.    Past Medical History  Diagnosis Date  . Chronic systolic heart failure (HCC)     EF 25-30% in past;  Echocardiogram 11/08/11: Normal LV wall thickness, EF 45-50%.  Marland Kitchen HTN (hypertension)   . History of diverticulitis of colon   . GERD (gastroesophageal reflux disease)   . History of nephrolithiasis   . Ischemic cardiomyopathy     s/p ICD 2008 (see CHF for EF)  . Urinary retention     foley indwelling 12/2010-04/2011  .  Arthritis   . HLD (hyperlipidemia)   . Coronary artery disease     a. Chronically occ RCA. b. Last cardiac catheterization 10/2010: Mid LAD 20-30%, distal LAD 40%, mid D1 40%, mid circumflex 20-30%, distal RCA occluded with left to right collaterals.  . Benign prostatic hypertrophy   . TIA (transient ischemic attack) 10/2011    carotids neg for ICA stenosis  . Atrial fibrillation ([place])   . Skin cancer   . BPH (benign prostatic hyperplasia)   . Renal insufficiency     Renal failure in July 2012 secondary to hypotension, ARB use, and   . Esophageal stricture   . Hemorrhoids     Past Surgical History  Procedure Laterality Date  . Cardiac defibrillator placement  12/2006    AICD  . Total knee arthroplasty  01/04/11    L TKA  . Cardiac catheterization  11-05-10  . Joint replacement    . Cataracts removed    . Transurethral resection of prostate  06/14/2011    Procedure: TRANSURETHRAL RESECTION OF THE PROSTATE (TURP);  Surgeon: Bernestine Amass, MD;  Location: [facility];  Service: Urology;  Laterality: N/A;  . Esophagogastroduodenoscopy N/A 01/24/2014    Procedure: ESOPHAGOGASTRODUODENOSCOPY (EGD);  Surgeon: Milus Banister, MD;  Location: [facility];  Service: Endoscopy;  Laterality: N/A;    Social History   Social History  . Marital Status: Married    Spouse Name: N/A  . Number of Children: N/A  . Years of Education: N/A   Occupational History  . retired    Social History Main Topics  . Smoking status: Former Smoker -- 2.00 packs/day for 30 years    Quit date: 06/14/1968  . Smokeless tobacco: Never Used  . Alcohol Use: No  . Drug Use: No  . Sexual Activity: Not Asked   Other Topics Concern  . None   Social History Narrative   Daily caffeine     Family History  Problem Relation Age of Onset  . Heart disease Son     x 2  . Colon cancer Neg Hx   . Heart attack Mother 77    pt unsure of age  . Liver disease Father 3    Review of Systems  Constitutional: Negative  for fever and chills.  Respiratory: Positive for cough (occasional) and wheezing (occasional). Negative for shortness of breath.   Cardiovascular: Positive for chest pain, palpitations (occasional) and leg swelling.  Gastrointestinal:       GERD occasionally  Neurological: Positive for dizziness. Negative for light-headedness and headaches.  Psychiatric/Behavioral: Positive for dysphoric mood. The patient is not nervous/anxious.        Objective:   Filed Vitals:  07/07/15 1337  BP: 150/72  Pulse: 70  Temp: 97.7 F (36.5 C)  Resp: 16   Filed Weights   07/07/15 1337  Weight: 180 lb (81.647 kg)   Body mass index is 27.76 kg/(m^2).   Physical Exam Constitutional: Appears well-developed and well-nourished. No distress.  Neck: Neck supple. No tracheal deviation present. No thyromegaly present.  No carotid bruit. No cervical adenopathy.   Cardiovascular: Normal rate, regular rhythm and normal heart sounds.   No murmur heard.  trace edema Pulmonary/Chest: Effort normal and breath sounds normal. No respiratory distress. No wheezes.  Psych: depressed mood     Assessment & Plan:   See Problem List for Assessment and Plan of chronic medical problems.

## 2015-07-07 NOTE — Assessment & Plan Note (Signed)
Asymptomatic Blood pressure elevated here today, rate controlled Continue current medication-on anticoagulation Follow-up with cardiology

## 2015-07-07 NOTE — Assessment & Plan Note (Signed)
Blood pressure elevated here today Advised them to monitor at home We'll follow up in 6 weeks for depression-can recheck blood pressure at that time

## 2015-07-07 NOTE — Assessment & Plan Note (Signed)
Not taking simvastatin - ? Discontinued, he denies side effects Restart simvastatin 40 mg daily

## 2015-07-07 NOTE — Assessment & Plan Note (Signed)
Experiencing atypical chest pain Advised him to discuss with cardiology this week Continue current medications

## 2015-07-07 NOTE — Progress Notes (Signed)
Pre visit review using our clinic review tool, if applicable. No additional management support is needed unless otherwise documented below in the visit note. 

## 2015-07-07 NOTE — Assessment & Plan Note (Signed)
New, since the death of his wife.  Functioning ok No anxiety Start cymbalta 20 daily Follow up in 6 weeks sooner if needed

## 2015-07-07 NOTE — Patient Instructions (Addendum)
   Medications reviewed and updated. Changes include restarting the simvastatin for your cholesterol.  We will also add Cymbalta for your depression.  Your prescription(s) have been submitted to your pharmacy. Please take as directed and contact our office if you believe you are having problem(s) with the medication(s).   Please schedule followup in 6 weeks.  Call or return sooner if you have any questions or concerns.

## 2015-07-10 ENCOUNTER — Encounter: Payer: Self-pay | Admitting: Internal Medicine

## 2015-07-10 ENCOUNTER — Ambulatory Visit (INDEPENDENT_AMBULATORY_CARE_PROVIDER_SITE_OTHER): Payer: Medicare Other | Admitting: *Deleted

## 2015-07-10 DIAGNOSIS — I255 Ischemic cardiomyopathy: Secondary | ICD-10-CM

## 2015-07-10 DIAGNOSIS — Z95810 Presence of automatic (implantable) cardiac defibrillator: Secondary | ICD-10-CM

## 2015-07-10 LAB — CUP PACEART INCLINIC DEVICE CHECK
Battery Voltage: 2.62 V
Brady Statistic RA Percent Paced: 6 %
HighPow Impedance: 37 Ohm
HighPow Impedance: 41 Ohm
Implantable Lead Implant Date: 20080716
Implantable Lead Location: 753860
Implantable Lead Model: 158
Implantable Lead Model: 4087
Implantable Lead Serial Number: 174941
Implantable Lead Serial Number: 281722
Lead Channel Pacing Threshold Amplitude: 0.8 V
Lead Channel Sensing Intrinsic Amplitude: 2.1 mV
Lead Channel Setting Pacing Amplitude: 2.4 V
Lead Channel Setting Pacing Pulse Width: 0.4 ms
MDC IDC LEAD IMPLANT DT: 20080716
MDC IDC LEAD LOCATION: 753859
MDC IDC MSMT LEADCHNL RA IMPEDANCE VALUE: 555 Ohm
MDC IDC MSMT LEADCHNL RA PACING THRESHOLD PULSEWIDTH: 0.8 ms
MDC IDC MSMT LEADCHNL RV IMPEDANCE VALUE: 827 Ohm
MDC IDC MSMT LEADCHNL RV PACING THRESHOLD AMPLITUDE: 0.8 V
MDC IDC MSMT LEADCHNL RV PACING THRESHOLD PULSEWIDTH: 0.4 ms
MDC IDC MSMT LEADCHNL RV SENSING INTR AMPL: 4.5 mV
MDC IDC PG SERIAL: 504434
MDC IDC SESS DTM: 20170126050000
MDC IDC SET LEADCHNL RA PACING AMPLITUDE: 2 V
MDC IDC STAT BRADY RV PERCENT PACED: 0 %

## 2015-07-10 NOTE — Progress Notes (Signed)
ICD check in clinic. Normal device function. Thresholds and sensing consistent with previous device measurements. Impedance trends stable over time. No evidence of any ventricular arrhythmias. No mode switches. Histogram distribution appropriate for patient and level of activity. No changes made this session. Device programmed at appropriate safety margins. Device programmed to optimize intrinsic conduction. Battery voltage 2.63V, RRT at 2.6V. Patient education completed including shock plan. Patient aware of auditory alert. ROV with device on 09/04/15 for battery check only and ROV with GT in 09/2015.

## 2015-07-11 ENCOUNTER — Other Ambulatory Visit: Payer: Self-pay | Admitting: Gastroenterology

## 2015-07-17 ENCOUNTER — Other Ambulatory Visit: Payer: Self-pay | Admitting: Gastroenterology

## 2015-08-18 ENCOUNTER — Encounter: Payer: Self-pay | Admitting: Internal Medicine

## 2015-08-18 ENCOUNTER — Ambulatory Visit (INDEPENDENT_AMBULATORY_CARE_PROVIDER_SITE_OTHER): Payer: Medicare Other | Admitting: Internal Medicine

## 2015-08-18 VITALS — BP 132/82 | HR 81 | Temp 97.8°F | Resp 16 | Wt 172.0 lb

## 2015-08-18 DIAGNOSIS — K219 Gastro-esophageal reflux disease without esophagitis: Secondary | ICD-10-CM

## 2015-08-18 DIAGNOSIS — J069 Acute upper respiratory infection, unspecified: Secondary | ICD-10-CM

## 2015-08-18 DIAGNOSIS — I255 Ischemic cardiomyopathy: Secondary | ICD-10-CM | POA: Diagnosis not present

## 2015-08-18 DIAGNOSIS — F329 Major depressive disorder, single episode, unspecified: Secondary | ICD-10-CM

## 2015-08-18 DIAGNOSIS — F32A Depression, unspecified: Secondary | ICD-10-CM

## 2015-08-18 MED ORDER — RANITIDINE HCL 300 MG PO TABS: 300.0000 mg | ORAL_TABLET | Freq: Every day | ORAL | Status: DC

## 2015-08-18 MED ORDER — SULFAMETHOXAZOLE-TRIMETHOPRIM 800-160 MG PO TABS: 1.0000 | ORAL_TABLET | Freq: Two times a day (BID) | ORAL | Status: AC

## 2015-08-18 MED ORDER — OMEPRAZOLE 40 MG PO CPDR: 40.0000 mg | DELAYED_RELEASE_CAPSULE | Freq: Every day | ORAL | Status: DC

## 2015-08-18 MED ORDER — DULOXETINE HCL 20 MG PO CPEP: 40.0000 mg | ORAL_CAPSULE | Freq: Every day | ORAL | Status: DC

## 2015-08-18 NOTE — Assessment & Plan Note (Signed)
?   Controlled -- he stated it was controlled at his last visit, but I wonder if that is true Restart omeprazole daily-prescription sent to pharmacy Stop daughters Carafate Ranitidine 300 mg at night

## 2015-08-18 NOTE — Progress Notes (Signed)
Subjective:    Patient ID: Jacob [name], male    DOB: 05/19/1932, 82 y.o.   MRN: [id number]  HPI Depression: He is here for follow-up of depression. He has been taking Cymbalta daily for 6 weeks. He denies any side effects. He states that he has not been able to sleep well, but is unsure if that is related to the medication or not. His daughter recommended that he switch to medication and take it at different times a day, which he is only down for a few days. His appetite has not improved. He is still not eating much and has lost 8 pounds in the past 6 weeks. His daughter states he is eating sweets and cookies, but not eating as much of the other foods. She has noticed that he is coming from more and she wonders if that is a sign that the medication is working. He still feels depressed.  Cold symptoms: The past 2 weeks he has been experiencing cold symptoms. Other individuals in the family have had it, but it has improved. His symptoms are not improving. He is a productive cough, wheeze and shortness of breath. He has had a sore throat, denies ear pain or fevers.  GERD: He ran out of his omeprazole and has been experiencing GERD symptoms. He needed refills today. He is unsure if his GERD was controlled and he was taking her medication daily. He has been taking his daughter's Carafate and has been working well and she wonders if he can continue that.    Medications and allergies reviewed with patient and updated if appropriate.  Patient Active Problem List   Diagnosis Date Noted  . Depression 07/07/2015  . Esophageal obstruction due to food impaction 01/24/2014  . Syncope 02/22/2013  . Cardiomyopathy ([place]) 02/22/2013  . CAD (coronary artery disease) 02/22/2013  . GERD (gastroesophageal reflux disease) 02/22/2013  . OA (osteoarthritis) of knee 02/22/2013  . TIA (transient ischemic attack) 11/07/2011  . Urinary retention 04/13/2011  . NEVUS, ATYPICAL 08/26/2010  . ECZEMA 05/27/2010  .  Atrial fibrillation ([place]) 04/29/2010  . ROTATOR CUFF INJURY, RIGHT SHOULDER 09/16/2009  . Essential hypertension 04/23/2009  . BENIGN PROSTATIC HYPERTROPHY, WITH OBSTRUCTION 04/23/2009  . ARTHRITIS 04/23/2009  . HYPERCHOLESTEROLEMIA, MIXED 04/22/2008  . CORONARY ATHEROSCLEROSIS, NATIVE VESSEL 04/22/2008  . CHF 04/22/2008  . Automatic implantable cardioverter-defibrillator in situ 04/22/2008    Current Outpatient Prescriptions on File Prior to Visit  Medication Sig Dispense Refill  . aspirin 325 MG tablet Take 325 mg by mouth daily. Pt states he only takes this sometimes (10/08/14)    . carvedilol (COREG) 6.25 MG tablet Take 1 tablet (6.25 mg total) by mouth 2 (two) times daily with a meal. 540 tablet 1  . dabigatran (PRADAXA) 150 MG CAPS capsule Take 1 capsule (150 mg total) by mouth 2 (two) times daily. 180 capsule 1  . DULoxetine (CYMBALTA) 20 MG capsule Take 1 capsule (20 mg total) by mouth daily. 90 capsule 0  . finasteride (PROSCAR) 5 MG tablet Take 1 tablet (5 mg total) by mouth daily. 90 tablet 1  . fluticasone (FLONASE) 50 MCG/ACT nasal spray Place 2 sprays into both nostrils daily as needed for allergies or rhinitis. 16 g 1  . furosemide (LASIX) 20 MG tablet Take 20 mg by mouth daily.     Marland Kitchen HYDROcodone-acetaminophen (NORCO) 7.5-325 MG tablet Take 1 tablet by mouth every 6 (six) hours as needed for moderate pain.    . isosorbide mononitrate (IMDUR) 30  MG 24 hr tablet Take 1 tablet (30 mg total) by mouth daily. 90 tablet 1  . NITROSTAT 0.4 MG SL tablet Place 0.4 mg under the tongue every 5 (five) minutes as needed. For chest pain    . omeprazole (PRILOSEC) 40 MG capsule TAKE ONE CAPSULE BY MOUTH EVERY DAY 30 capsule 6  . simvastatin (ZOCOR) 40 MG tablet Take 1 tablet (40 mg total) by mouth at bedtime. 90 tablet 1  . solifenacin (VESICARE) 5 MG tablet Take 5 mg by mouth daily.      No current facility-administered medications on file prior to visit.    Past Medical History    Diagnosis Date  . Chronic systolic heart failure (HCC)     EF 25-30% in past;  Echocardiogram 11/08/11: Normal LV wall thickness, EF 45-50%.  Marland Kitchen HTN (hypertension)   . History of diverticulitis of colon   . GERD (gastroesophageal reflux disease)   . History of nephrolithiasis   . Ischemic cardiomyopathy     s/p ICD 2008 (see CHF for EF)  . Urinary retention     foley indwelling 12/2010-04/2011  . Arthritis   . HLD (hyperlipidemia)   . Coronary artery disease     a. Chronically occ RCA. b. Last cardiac catheterization 10/2010: Mid LAD 20-30%, distal LAD 40%, mid D1 40%, mid circumflex 20-30%, distal RCA occluded with left to right collaterals.  . Benign prostatic hypertrophy   . TIA (transient ischemic attack) 10/2011    carotids neg for ICA stenosis  . Atrial fibrillation ([place])   . Skin cancer   . BPH (benign prostatic hyperplasia)   . Renal insufficiency     Renal failure in July 2012 secondary to hypotension, ARB use, and   . Esophageal stricture   . Hemorrhoids     Past Surgical History  Procedure Laterality Date  . Cardiac defibrillator placement  12/2006    AICD  . Total knee arthroplasty  01/04/11    L TKA  . Cardiac catheterization  11-05-10  . Joint replacement    . Cataracts removed    . Transurethral resection of prostate  06/14/2011    Procedure: TRANSURETHRAL RESECTION OF THE PROSTATE (TURP);  Surgeon: Bernestine Amass, MD;  Location: [facility];  Service: Urology;  Laterality: N/A;  . Esophagogastroduodenoscopy N/A 01/24/2014    Procedure: ESOPHAGOGASTRODUODENOSCOPY (EGD);  Surgeon: Milus Banister, MD;  Location: [facility];  Service: Endoscopy;  Laterality: N/A;    Social History   Social History  . Marital Status: Married    Spouse Name: N/A  . Number of Children: N/A  . Years of Education: N/A   Occupational History  . retired    Social History Main Topics  . Smoking status: Former Smoker -- 2.00 packs/day for 30 years    Quit date: 06/14/1968  .  Smokeless tobacco: Never Used  . Alcohol Use: No  . Drug Use: No  . Sexual Activity: Not Asked   Other Topics Concern  . None   Social History Narrative   Daily caffeine     Family History  Problem Relation Age of Onset  . Heart disease Son     x 2  . Colon cancer Neg Hx   . Heart attack Mother 7    pt unsure of age  . Liver disease Father 49    Review of Systems  Constitutional: Positive for appetite change (decreased, no change). Negative for fever and chills.  Respiratory: Positive for cough (productive), shortness of breath  and wheezing.   Cardiovascular: Positive for palpitations (at night). Negative for chest pain and leg swelling.  Gastrointestinal: Negative for nausea and abdominal pain.  Neurological: Positive for dizziness (occasional) and light-headedness (occasional, not new). Negative for headaches.  Psychiatric/Behavioral: Positive for sleep disturbance.       Objective:   Filed Vitals:   08/18/15 1625  BP: 132/82  Pulse: 81  Temp: 97.8 F (36.6 C)  Resp: 16   Filed Weights   08/18/15 1625  Weight: 172 lb (78.019 kg)   Body mass index is 26.53 kg/(m^2).   Physical Exam Constitutional: Appears well-developed and well-nourished. No distress.  Neck: Oropharynx without erythema or exudate, Neck supple. No tracheal deviation present. No thyromegaly present. No carotid bruit. No cervical adenopathy.   Cardiovascular: Normal rate, regular rhythm and normal heart sounds.   No murmur heard.  No edema Pulmonary/Chest: Effort normal and breath sounds normal. No respiratory distress. No wheezes.  Abdomen: Nontender, nondistended         Assessment & Plan:    Upper respiratory infection Given duration and lack of improvement we will prescribe an antibiotic Bactrim twice a day 10 days-he has tolerated this in the past Call or return if no improvement  See Problem List for Assessment and Plan of chronic medical problems.  Follow-up in 6 weeks,  sooner if needed

## 2015-08-18 NOTE — Progress Notes (Signed)
Pre visit review using our clinic review tool, if applicable. No additional management support is needed unless otherwise documented below in the visit note. 

## 2015-08-18 NOTE — Patient Instructions (Addendum)
We will increase the cymbalta to 40 mg daily.   We will prescribe bactrim for your upper respiratory infection.  Take the omeprazole as prescribed and add zantac at bedtime.    Follow up in 6 weeks

## 2015-08-18 NOTE — Assessment & Plan Note (Signed)
?   Slightly improved and his daughter states he has been out of his room No improvement in his appetite and he has lost some weight. She does seem to be eating, but not the right foods and not as much Discussed options-we decided to increase the Cymbalta to 40 mg daily Follow-up in 6 weeks If there is no improvement in his appetite or if he hasn't lost any additional weight he needs further evaluation to rule out other causes for decreased appetite and weight loss

## 2015-08-23 DIAGNOSIS — Z79891 Long term (current) use of opiate analgesic: Secondary | ICD-10-CM | POA: Diagnosis not present

## 2015-08-23 DIAGNOSIS — M1711 Unilateral primary osteoarthritis, right knee: Secondary | ICD-10-CM | POA: Diagnosis not present

## 2015-08-23 DIAGNOSIS — M5136 Other intervertebral disc degeneration, lumbar region: Secondary | ICD-10-CM | POA: Diagnosis not present

## 2015-08-27 ENCOUNTER — Telehealth: Payer: Self-pay | Admitting: Emergency Medicine

## 2015-08-27 MED ORDER — LEVOFLOXACIN 500 MG PO TABS: 500.0000 mg | ORAL_TABLET | Freq: Every day | ORAL | Status: DC

## 2015-08-27 NOTE — Telephone Encounter (Signed)
levaquin sent to [facility]

## 2015-08-27 NOTE — Telephone Encounter (Signed)
Pts daughter called in stating that the pt was prescribed Bactrim on 08/18/15. This caused his throat and chest to feel tight. He only took 3-4 days worth. Verified [facility] to send new antibiotic to send on.

## 2015-09-04 ENCOUNTER — Encounter: Payer: Self-pay | Admitting: Internal Medicine

## 2015-09-04 ENCOUNTER — Encounter: Payer: Self-pay | Admitting: *Deleted

## 2015-09-04 ENCOUNTER — Ambulatory Visit (INDEPENDENT_AMBULATORY_CARE_PROVIDER_SITE_OTHER): Payer: Medicare Other | Admitting: Internal Medicine

## 2015-09-04 VITALS — BP 117/70 | HR 73 | Ht 68.0 in

## 2015-09-04 DIAGNOSIS — I4891 Unspecified atrial fibrillation: Secondary | ICD-10-CM

## 2015-09-04 DIAGNOSIS — I255 Ischemic cardiomyopathy: Secondary | ICD-10-CM | POA: Diagnosis not present

## 2015-09-04 DIAGNOSIS — I429 Cardiomyopathy, unspecified: Secondary | ICD-10-CM

## 2015-09-04 NOTE — Patient Instructions (Addendum)
Medication Instructions:  Your physician recommends that you continue on your current medications as directed. Please refer to the Current Medication list given to you today.     Labwork: negative   Testing/Procedures: Your physician has requested that you have an echocardiogram. Echocardiography is a painless test that uses sound waves to create images of your heart. It provides your doctor with information about the size and shape of your heart and how well your heart's chambers and valves are working. This procedure takes approximately one hour. There are no restrictions for this procedure.---same day as appointment with Dr Lovena Le    Follow-Up: Your physician recommends that you schedule a follow-up appointment in: 3 months with Dr Lovena Le   Any Other Special Instructions Will Be Listed Below (If Applicable).     If you need a refill on your cardiac medications before your next appointment, please call your pharmacy.

## 2015-09-05 LAB — CUP PACEART INCLINIC DEVICE CHECK
Battery Voltage: 2.58 V
Brady Statistic RA Percent Paced: 7 %
Brady Statistic RV Percent Paced: 5 %
HIGH POWER IMPEDANCE MEASURED VALUE: 37 Ohm
HIGH POWER IMPEDANCE MEASURED VALUE: 41 Ohm
Implantable Lead Implant Date: 20080716
Implantable Lead Location: 753859
Implantable Lead Serial Number: 174941
Implantable Lead Serial Number: 281722
Lead Channel Pacing Threshold Amplitude: 0.8 V
Lead Channel Pacing Threshold Pulse Width: 0.4 ms
Lead Channel Sensing Intrinsic Amplitude: 7.3 mV
Lead Channel Setting Pacing Pulse Width: 0.4 ms
MDC IDC LEAD IMPLANT DT: 20080716
MDC IDC LEAD LOCATION: 753860
MDC IDC LEAD MODEL: 158
MDC IDC MSMT LEADCHNL RA IMPEDANCE VALUE: 613 Ohm
MDC IDC MSMT LEADCHNL RA SENSING INTR AMPL: 2 mV
MDC IDC MSMT LEADCHNL RV IMPEDANCE VALUE: 801 Ohm
MDC IDC PG SERIAL: 504434
MDC IDC SESS DTM: 20170323040000
MDC IDC SET LEADCHNL RV PACING AMPLITUDE: 2.4 V

## 2015-09-10 NOTE — Progress Notes (Signed)
HPI Mr. Granados returns today for followup. He is a very pleasant 82 year old man with an ischemic cardiomyopathy, chronic systolic heart failure, class II, ventricular tachycardia, and atrial fibrillation, status post ICD implantation. The patient has been well-controlled from his atrial fibrillation on medical therapy and he denies chest pain, or shortness of breath. No peripheral edema. He does admit to some sedentary lifestyle. He has reached ERI.  Allergies  Allergen Reactions  . Bactrim [Sulfamethoxazole-Trimethoprim] Other (See Comments)    Throat swelling  . Penicillins Itching, Swelling and Rash     Current Outpatient Prescriptions  Medication Sig Dispense Refill  . aspirin 325 MG tablet Take 325 mg by mouth daily. Pt states he only takes this sometimes (10/08/14)    . carvedilol (COREG) 6.25 MG tablet Take 1 tablet (6.25 mg total) by mouth 2 (two) times daily with a meal. 540 tablet 1  . dabigatran (PRADAXA) 150 MG CAPS capsule Take 1 capsule (150 mg total) by mouth 2 (two) times daily. (Patient not taking: Reported on 09/04/2015) 180 capsule 1  . DULoxetine (CYMBALTA) 20 MG capsule Take 2 capsules (40 mg total) by mouth daily. 180 capsule 0  . finasteride (PROSCAR) 5 MG tablet Take 1 tablet (5 mg total) by mouth daily. 90 tablet 1  . fluticasone (FLONASE) 50 MCG/ACT nasal spray SHAKE LIQUID AND USE 2 SPRAYS IN EACH NOSTRIL DAILY AS NEEDED FOR ALLERGIES OR RHINITIS 16 g 1  . furosemide (LASIX) 20 MG tablet Take 20 mg by mouth daily.     Marland Kitchen HYDROcodone-acetaminophen (NORCO) 7.5-325 MG tablet Take 1 tablet by mouth every 6 (six) hours as needed for moderate pain.    . isosorbide mononitrate (IMDUR) 30 MG 24 hr tablet Take 1 tablet (30 mg total) by mouth daily. 90 tablet 1  . levofloxacin (LEVAQUIN) 500 MG tablet Take 1 tablet (500 mg total) by mouth daily. 7 tablet 0  . NITROSTAT 0.4 MG SL tablet Place 0.4 mg under the tongue every 5 (five) minutes as needed. For chest pain    . omeprazole  (PRILOSEC) 40 MG capsule Take 1 capsule (40 mg total) by mouth daily. 30 capsule 5  . ranitidine (ZANTAC) 300 MG tablet Take 1 tablet (300 mg total) by mouth at bedtime. 90 tablet 1  . simvastatin (ZOCOR) 40 MG tablet Take 1 tablet (40 mg total) by mouth at bedtime. 90 tablet 1  . solifenacin (VESICARE) 5 MG tablet Take 5 mg by mouth daily.      No current facility-administered medications for this visit.     Past Medical History  Diagnosis Date  . Chronic systolic heart failure (HCC)     EF 25-30% in past;  Echocardiogram 11/08/11: Normal LV wall thickness, EF 45-50%.  Marland Kitchen HTN (hypertension)   . History of diverticulitis of colon   . GERD (gastroesophageal reflux disease)   . History of nephrolithiasis   . Ischemic cardiomyopathy     s/p ICD 2008 (see CHF for EF)  . Urinary retention     foley indwelling 12/2010-04/2011  . Arthritis   . HLD (hyperlipidemia)   . Coronary artery disease     a. Chronically occ RCA. b. Last cardiac catheterization 10/2010: Mid LAD 20-30%, distal LAD 40%, mid D1 40%, mid circumflex 20-30%, distal RCA occluded with left to right collaterals.  . Benign prostatic hypertrophy   . TIA (transient ischemic attack) 10/2011    carotids neg for ICA stenosis  . Atrial fibrillation ([place])   . Skin cancer   .  BPH (benign prostatic hyperplasia)   . Renal insufficiency     Renal failure in July 2012 secondary to hypotension, ARB use, and   . Esophageal stricture   . Hemorrhoids     ROS:   All systems reviewed and negative except as noted in the HPI.   Past Surgical History  Procedure Laterality Date  . Cardiac defibrillator placement  12/2006    AICD  . Total knee arthroplasty  01/04/11    L TKA  . Cardiac catheterization  11-05-10  . Joint replacement    . Cataracts removed    . Transurethral resection of prostate  06/14/2011    Procedure: TRANSURETHRAL RESECTION OF THE PROSTATE (TURP);  Surgeon: Bernestine Amass, MD;  Location: [facility];  Service: Urology;   Laterality: N/A;  . Esophagogastroduodenoscopy N/A 01/24/2014    Procedure: ESOPHAGOGASTRODUODENOSCOPY (EGD);  Surgeon: Milus Banister, MD;  Location: [facility];  Service: Endoscopy;  Laterality: N/A;     Family History  Problem Relation Age of Onset  . Heart disease Son     x 2  . Colon cancer Neg Hx   . Heart attack Mother 49    pt unsure of age  . Liver disease Father 87     Social History   Social History  . Marital Status: Married    Spouse Name: N/A  . Number of Children: N/A  . Years of Education: N/A   Occupational History  . retired    Social History Main Topics  . Smoking status: Former Smoker -- 2.00 packs/day for 30 years    Quit date: 06/14/1968  . Smokeless tobacco: Never Used  . Alcohol Use: No  . Drug Use: No  . Sexual Activity: Not on file   Other Topics Concern  . Not on file   Social History Narrative   Daily caffeine      BP 117/70 mmHg  Pulse 73  Ht 5\' 8"  (1.727 m)  Physical Exam:  Well appearing 82 year old man, NAD HEENT: Unremarkable Neck:  6 cm JVD, no thyromegally Lungs:  Clear except for scattered basilar rales, no wheezes, no rhonchi. HEART:  IRegular rate rhythm, no murmurs, no rubs, no clicks Abd:  soft, positive bowel sounds, no organomegally, no rebound, no guarding Ext:  2 plus pulses, no edema, no cyanosis, no clubbing Skin:  No rashes no nodules Neuro:  CN II through XII intact, motor grossly intact   DEVICE  Normal device function.  See PaceArt for details.   Assess/Plan: 1. Chronic systolic heart failure - his symptoms are well compensated. We will repeat his 2D echo to see if he needs another ICD placed. I would not plan to place a new device if EF is normalized or nearly so. 2. CAD - he denies anginal symptoms. Will follow.  3. Atrial fib - he appears to be well controlled.  4. ICD - his device has reached ERI. Will recheck his echo. I would not anticipate placing a new device if his EF is above  35%.  Mikle [email or web address].D.

## 2015-10-11 ENCOUNTER — Encounter (HOSPITAL_COMMUNITY): Payer: Self-pay | Admitting: Emergency Medicine

## 2015-10-11 ENCOUNTER — Inpatient Hospital Stay (HOSPITAL_COMMUNITY)
Admission: EM | Admit: 2015-10-11 | Discharge: 2015-10-17 | DRG: 286 | Disposition: A | Discharge status: Home / Self Care | Payer: Medicare Other | Attending: Internal Medicine | Admitting: Internal Medicine

## 2015-10-11 ENCOUNTER — Inpatient Hospital Stay (HOSPITAL_COMMUNITY): Payer: Medicare Other

## 2015-10-11 ENCOUNTER — Emergency Department (HOSPITAL_COMMUNITY): Payer: Medicare Other

## 2015-10-11 ENCOUNTER — Ambulatory Visit (INDEPENDENT_AMBULATORY_CARE_PROVIDER_SITE_OTHER): Payer: Medicare Other | Admitting: Family Medicine

## 2015-10-11 ENCOUNTER — Encounter: Payer: Self-pay | Admitting: Family Medicine

## 2015-10-11 VITALS — BP 142/100 | HR 155 | Temp 97.9°F | Ht 68.0 in | Wt 164.0 lb

## 2015-10-11 DIAGNOSIS — I4892 Unspecified atrial flutter: Secondary | ICD-10-CM | POA: Diagnosis present

## 2015-10-11 DIAGNOSIS — I130 Hypertensive heart and chronic kidney disease with heart failure and stage 1 through stage 4 chronic kidney disease, or unspecified chronic kidney disease: Secondary | ICD-10-CM | POA: Diagnosis present

## 2015-10-11 DIAGNOSIS — Z9842 Cataract extraction status, left eye: Secondary | ICD-10-CM | POA: Diagnosis not present

## 2015-10-11 DIAGNOSIS — I255 Ischemic cardiomyopathy: Secondary | ICD-10-CM

## 2015-10-11 DIAGNOSIS — I5023 Acute on chronic systolic (congestive) heart failure: Secondary | ICD-10-CM

## 2015-10-11 DIAGNOSIS — Z7982 Long term (current) use of aspirin: Secondary | ICD-10-CM | POA: Diagnosis not present

## 2015-10-11 DIAGNOSIS — Z95810 Presence of automatic (implantable) cardiac defibrillator: Secondary | ICD-10-CM | POA: Diagnosis not present

## 2015-10-11 DIAGNOSIS — I4891 Unspecified atrial fibrillation: Secondary | ICD-10-CM | POA: Diagnosis present

## 2015-10-11 DIAGNOSIS — I471 Supraventricular tachycardia: Secondary | ICD-10-CM | POA: Diagnosis present

## 2015-10-11 DIAGNOSIS — I5041 Acute combined systolic (congestive) and diastolic (congestive) heart failure: Secondary | ICD-10-CM

## 2015-10-11 DIAGNOSIS — Z9841 Cataract extraction status, right eye: Secondary | ICD-10-CM

## 2015-10-11 DIAGNOSIS — R000 Tachycardia, unspecified: Secondary | ICD-10-CM | POA: Diagnosis not present

## 2015-10-11 DIAGNOSIS — Z7951 Long term (current) use of inhaled steroids: Secondary | ICD-10-CM | POA: Diagnosis not present

## 2015-10-11 DIAGNOSIS — R05 Cough: Secondary | ICD-10-CM | POA: Diagnosis not present

## 2015-10-11 DIAGNOSIS — N400 Enlarged prostate without lower urinary tract symptoms: Secondary | ICD-10-CM | POA: Diagnosis present

## 2015-10-11 DIAGNOSIS — I2510 Atherosclerotic heart disease of native coronary artery without angina pectoris: Secondary | ICD-10-CM | POA: Diagnosis not present

## 2015-10-11 DIAGNOSIS — I482 Chronic atrial fibrillation: Secondary | ICD-10-CM | POA: Diagnosis present

## 2015-10-11 DIAGNOSIS — I5043 Acute on chronic combined systolic (congestive) and diastolic (congestive) heart failure: Secondary | ICD-10-CM | POA: Diagnosis not present

## 2015-10-11 DIAGNOSIS — Z87891 Personal history of nicotine dependence: Secondary | ICD-10-CM

## 2015-10-11 DIAGNOSIS — I5021 Acute systolic (congestive) heart failure: Secondary | ICD-10-CM | POA: Diagnosis not present

## 2015-10-11 DIAGNOSIS — I429 Cardiomyopathy, unspecified: Secondary | ICD-10-CM

## 2015-10-11 DIAGNOSIS — N183 Chronic kidney disease, stage 3 (moderate): Secondary | ICD-10-CM | POA: Diagnosis present

## 2015-10-11 DIAGNOSIS — I5022 Chronic systolic (congestive) heart failure: Secondary | ICD-10-CM | POA: Insufficient documentation

## 2015-10-11 DIAGNOSIS — Z66 Do not resuscitate: Secondary | ICD-10-CM | POA: Diagnosis present

## 2015-10-11 DIAGNOSIS — R0602 Shortness of breath: Secondary | ICD-10-CM | POA: Diagnosis not present

## 2015-10-11 DIAGNOSIS — I484 Atypical atrial flutter: Secondary | ICD-10-CM | POA: Diagnosis not present

## 2015-10-11 DIAGNOSIS — E785 Hyperlipidemia, unspecified: Secondary | ICD-10-CM | POA: Diagnosis present

## 2015-10-11 DIAGNOSIS — K219 Gastro-esophageal reflux disease without esophagitis: Secondary | ICD-10-CM | POA: Diagnosis present

## 2015-10-11 DIAGNOSIS — I480 Paroxysmal atrial fibrillation: Secondary | ICD-10-CM | POA: Diagnosis present

## 2015-10-11 DIAGNOSIS — H532 Diplopia: Secondary | ICD-10-CM | POA: Diagnosis not present

## 2015-10-11 DIAGNOSIS — Z8673 Personal history of transient ischemic attack (TIA), and cerebral infarction without residual deficits: Secondary | ICD-10-CM

## 2015-10-11 DIAGNOSIS — Z96652 Presence of left artificial knee joint: Secondary | ICD-10-CM | POA: Diagnosis present

## 2015-10-11 DIAGNOSIS — R059 Cough, unspecified: Secondary | ICD-10-CM

## 2015-10-11 DIAGNOSIS — I25118 Atherosclerotic heart disease of native coronary artery with other forms of angina pectoris: Secondary | ICD-10-CM | POA: Diagnosis not present

## 2015-10-11 LAB — COMPREHENSIVE METABOLIC PANEL
ALT: 10 U/L — ABNORMAL LOW (ref 17–63)
ANION GAP: 10 (ref 5–15)
AST: 17 U/L (ref 15–41)
Albumin: 4.2 g/dL (ref 3.5–5.0)
Alkaline Phosphatase: 81 U/L (ref 38–126)
BILIRUBIN TOTAL: 2.1 mg/dL — AB (ref 0.3–1.2)
BUN: 20 mg/dL (ref 6–20)
CHLORIDE: 110 mmol/L (ref 101–111)
CO2: 22 mmol/L (ref 22–32)
Calcium: 9.2 mg/dL (ref 8.9–10.3)
Creatinine, Ser: 1.61 mg/dL — ABNORMAL HIGH (ref 0.61–1.24)
GFR, EST AFRICAN AMERICAN: 44 mL/min — AB (ref >60)
GFR, EST NON AFRICAN AMERICAN: 38 mL/min — AB (ref >60)
Glucose, Bld: 132 mg/dL — ABNORMAL HIGH (ref 65–99)
POTASSIUM: 4.2 mmol/L (ref 3.5–5.1)
Sodium: 142 mmol/L (ref 135–145)
TOTAL PROTEIN: 8 g/dL (ref 6.5–8.1)

## 2015-10-11 LAB — CBC WITH DIFFERENTIAL/PLATELET
BASOS ABS: 0 10*3/uL (ref 0.0–0.1)
Basophils Relative: 0 %
EOS PCT: 1 %
Eosinophils Absolute: 0.1 10*3/uL (ref 0.0–0.7)
HEMATOCRIT: 39.1 % (ref 39.0–52.0)
Hemoglobin: 12.8 g/dL — ABNORMAL LOW (ref 13.0–17.0)
LYMPHS ABS: 1.3 10*3/uL (ref 0.7–4.0)
LYMPHS PCT: 13 %
MCH: 28.4 pg (ref 26.0–34.0)
MCHC: 32.7 g/dL (ref 30.0–36.0)
MCV: 86.7 fL (ref 78.0–100.0)
MONO ABS: 0.7 10*3/uL (ref 0.1–1.0)
MONOS PCT: 6 %
NEUTROS ABS: 8.4 10*3/uL — AB (ref 1.7–7.7)
Neutrophils Relative %: 80 %
PLATELETS: 225 10*3/uL (ref 150–400)
RBC: 4.51 MIL/uL (ref 4.22–5.81)
RDW: 16.3 % — AB (ref 11.5–15.5)
WBC: 10.5 10*3/uL (ref 4.0–10.5)

## 2015-10-11 LAB — ECHOCARDIOGRAM COMPLETE
Height: 67.5 in
WEIGHTICAEL: 2564.39 [oz_av]

## 2015-10-11 LAB — MAGNESIUM: MAGNESIUM: 2.1 mg/dL (ref 1.7–2.4)

## 2015-10-11 LAB — BRAIN NATRIURETIC PEPTIDE: B NATRIURETIC PEPTIDE 5: 2667.6 pg/mL — AB (ref 0.0–100.0)

## 2015-10-11 LAB — TROPONIN I: Troponin I: <0.03 ng/mL (ref <0.031)

## 2015-10-11 MED ORDER — METOPROLOL TARTRATE 25 MG PO TABS
25.0000 mg | ORAL_TABLET | Freq: Two times a day (BID) | ORAL | Status: DC
Start: 2015-10-11 — End: 2015-10-17
  Administered 2015-10-11 – 2015-10-17 (×13): 25 mg via ORAL
  Filled 2015-10-11 (×13): qty 1

## 2015-10-11 MED ORDER — DILTIAZEM HCL 100 MG IV SOLR
5.0000 mg/h | INTRAVENOUS | Status: DC
  Administered 2015-10-11: 5 mg/h via INTRAVENOUS

## 2015-10-11 MED ORDER — ROSUVASTATIN CALCIUM 10 MG PO TABS
10.0000 mg | ORAL_TABLET | Freq: Every day | ORAL | Status: DC
  Administered 2015-10-11 – 2015-10-16 (×6): 10 mg via ORAL
  Filled 2015-10-11 (×8): qty 1

## 2015-10-11 MED ORDER — DILTIAZEM LOAD VIA INFUSION
15.0000 mg | Freq: Once | INTRAVENOUS | Status: AC
  Administered 2015-10-11: 15 mg via INTRAVENOUS

## 2015-10-11 MED ORDER — CETYLPYRIDINIUM CHLORIDE 0.05 % MT LIQD
7.0000 mL | Freq: Two times a day (BID) | OROMUCOSAL | Status: DC
  Administered 2015-10-11 – 2015-10-17 (×11): 7 mL via OROMUCOSAL

## 2015-10-11 MED ORDER — ISOSORBIDE MONONITRATE ER 30 MG PO TB24
30.0000 mg | ORAL_TABLET | Freq: Every day | ORAL | Status: DC
  Administered 2015-10-12 – 2015-10-16 (×5): 30 mg via ORAL
  Filled 2015-10-11 (×6): qty 1

## 2015-10-11 MED ORDER — DILTIAZEM HCL 100 MG IV SOLR
INTRAVENOUS | Status: AC
  Filled 2015-10-11: qty 100

## 2015-10-11 MED ORDER — SODIUM CHLORIDE 0.9% FLUSH
3.0000 mL | Freq: Two times a day (BID) | INTRAVENOUS | Status: DC
  Administered 2015-10-11 – 2015-10-16 (×3): 3 mL via INTRAVENOUS

## 2015-10-11 MED ORDER — ONDANSETRON HCL 4 MG/2ML IJ SOLN: 4.0000 mg | Freq: Four times a day (QID) | INTRAMUSCULAR | Status: DC | PRN

## 2015-10-11 MED ORDER — PANTOPRAZOLE SODIUM 40 MG PO TBEC
40.0000 mg | DELAYED_RELEASE_TABLET | Freq: Every day | ORAL | Status: DC
  Administered 2015-10-12 – 2015-10-17 (×6): 40 mg via ORAL
  Filled 2015-10-11 (×7): qty 1

## 2015-10-11 MED ORDER — ENSURE ENLIVE PO LIQD
237.0000 mL | Freq: Two times a day (BID) | ORAL | Status: DC
  Administered 2015-10-11: 237 mL via ORAL

## 2015-10-11 MED ORDER — TRAZODONE HCL 50 MG PO TABS
50.0000 mg | ORAL_TABLET | Freq: Every evening | ORAL | Status: DC | PRN
  Administered 2015-10-11: 50 mg via ORAL
  Filled 2015-10-11: qty 1

## 2015-10-11 MED ORDER — ACETAMINOPHEN 325 MG PO TABS: 650.0000 mg | ORAL_TABLET | ORAL | Status: DC | PRN

## 2015-10-11 MED ORDER — SODIUM CHLORIDE 0.9 % IV SOLN: 250.0000 mL | INTRAVENOUS | Status: DC | PRN

## 2015-10-11 MED ORDER — FUROSEMIDE 10 MG/ML IJ SOLN
40.0000 mg | Freq: Once | INTRAMUSCULAR | Status: AC
  Administered 2015-10-11: 40 mg via INTRAVENOUS
  Filled 2015-10-11: qty 4

## 2015-10-11 MED ORDER — SIMVASTATIN 40 MG PO TABS: 40.0000 mg | ORAL_TABLET | Freq: Every day | ORAL | Status: DC

## 2015-10-11 MED ORDER — DABIGATRAN ETEXILATE MESYLATE 150 MG PO CAPS
150.0000 mg | ORAL_CAPSULE | Freq: Two times a day (BID) | ORAL | Status: DC
Start: 2015-10-11 — End: 2015-10-12
  Administered 2015-10-11: 150 mg via ORAL
  Filled 2015-10-11 (×3): qty 1

## 2015-10-11 MED ORDER — DULOXETINE HCL 20 MG PO CPEP
40.0000 mg | ORAL_CAPSULE | Freq: Every day | ORAL | Status: DC
  Administered 2015-10-12 – 2015-10-17 (×6): 40 mg via ORAL
  Filled 2015-10-11 (×8): qty 2

## 2015-10-11 MED ORDER — ADENOSINE 6 MG/2ML IV SOLN
INTRAVENOUS | Status: AC
  Filled 2015-10-11: qty 2

## 2015-10-11 MED ORDER — FINASTERIDE 5 MG PO TABS
5.0000 mg | ORAL_TABLET | Freq: Every day | ORAL | Status: DC
  Administered 2015-10-12 – 2015-10-17 (×6): 5 mg via ORAL
  Filled 2015-10-11 (×7): qty 1

## 2015-10-11 MED ORDER — ENSURE ENLIVE PO LIQD
237.0000 mL | Freq: Two times a day (BID) | ORAL | Status: DC
  Administered 2015-10-12 – 2015-10-16 (×3): 237 mL via ORAL

## 2015-10-11 MED ORDER — SODIUM CHLORIDE 0.9% FLUSH: 3.0000 mL | INTRAVENOUS | Status: DC | PRN

## 2015-10-11 MED ORDER — FUROSEMIDE 10 MG/ML IJ SOLN
40.0000 mg | Freq: Two times a day (BID) | INTRAMUSCULAR | Status: DC
  Administered 2015-10-11 – 2015-10-12 (×2): 40 mg via INTRAVENOUS
  Filled 2015-10-11 (×2): qty 4

## 2015-10-11 MED ORDER — FLUTICASONE PROPIONATE 50 MCG/ACT NA SUSP
1.0000 | Freq: Every day | NASAL | Status: DC
  Administered 2015-10-11 – 2015-10-17 (×7): 1 via NASAL
  Filled 2015-10-11 (×2): qty 16

## 2015-10-11 NOTE — Patient Instructions (Signed)
Go to [facility] ED now for further evaluation.

## 2015-10-11 NOTE — ED Notes (Signed)
Dr. Schlossman at bedside. 

## 2015-10-11 NOTE — Progress Notes (Signed)
*  PRELIMINARY RESULTS* Echocardiogram 2D Echocardiogram has been performed.  Leavy Cella 10/11/2015, 2:53 PM

## 2015-10-11 NOTE — ED Notes (Signed)
Completion of 15 mg bolus Cardizem HR 108 A-fib with PVC;s  BP 130/75  Maintaining Cardizem at 5 mg/ hr

## 2015-10-11 NOTE — ED Provider Notes (Signed)
CSN: [id number]     Arrival date & time 10/11/15  0945 History   First MD Initiated Contact with Patient 10/11/15 1013     Chief Complaint  Patient presents with  . Shortness of Breath  . Palpitations     (Consider location/radiation/quality/duration/timing/severity/associated sxs/prior Treatment) HPI   82 year old male with a history of chronic systolic heart failure, defibullator in place, hypertension, hyperlipidemia, atrial fibrillation, GERD presents with concern for shortness of breath. Patient reports cough over the last several weeks, productive of clear sputum. Reports sore throat, however denies fevers, nasal congestion. Patient has been taking Mucinex D over the last week without relief. 2 days ago slowly developed shortness of breath. Reports some increased dyspnea with lying down. Denies any chest pain.  Past Medical History  Diagnosis Date  . Chronic systolic heart failure (HCC)     EF 25-30% in past;  Echocardiogram 11/08/11: Normal LV wall thickness, EF 45-50%.  Marland Kitchen HTN (hypertension)   . History of diverticulitis of colon   . GERD (gastroesophageal reflux disease)   . History of nephrolithiasis   . Ischemic cardiomyopathy     s/p ICD 2008 (see CHF for EF)  . Urinary retention     foley indwelling 12/2010-04/2011  . Arthritis   . HLD (hyperlipidemia)   . Coronary artery disease     a. Chronically occ RCA. b. Last cardiac catheterization 10/2010: Mid LAD 20-30%, distal LAD 40%, mid D1 40%, mid circumflex 20-30%, distal RCA occluded with left to right collaterals.  . Benign prostatic hypertrophy   . TIA (transient ischemic attack) 10/2011    carotids neg for ICA stenosis  . Atrial fibrillation ([place])   . Skin cancer   . BPH (benign prostatic hyperplasia)   . Renal insufficiency     Renal failure in July 2012 secondary to hypotension, ARB use, and   . Esophageal stricture   . Hemorrhoids    Past Surgical History  Procedure Laterality Date  . Cardiac defibrillator  placement  12/2006    AICD  . Total knee arthroplasty  01/04/11    L TKA  . Cardiac catheterization  11-05-10  . Joint replacement    . Cataracts removed    . Transurethral resection of prostate  06/14/2011    Procedure: TRANSURETHRAL RESECTION OF THE PROSTATE (TURP);  Surgeon: Bernestine Amass, MD;  Location: [facility];  Service: Urology;  Laterality: N/A;  . Esophagogastroduodenoscopy N/A 01/24/2014    Procedure: ESOPHAGOGASTRODUODENOSCOPY (EGD);  Surgeon: Milus Banister, MD;  Location: [facility];  Service: Endoscopy;  Laterality: N/A;   Family History  Problem Relation Age of Onset  . Heart disease Son     x 2  . Colon cancer Neg Hx   . Heart attack Mother 94    pt unsure of age  . Liver disease Father 36   Social History  Substance Use Topics  . Smoking status: Former Smoker -- 2.00 packs/day for 30 years    Quit date: 06/14/1968  . Smokeless tobacco: Never Used  . Alcohol Use: No    Review of Systems  Constitutional: Positive for fatigue. Negative for fever.  HENT: Positive for sore throat.   Eyes: Negative for visual disturbance.  Respiratory: Positive for cough and shortness of breath.   Cardiovascular: Positive for palpitations. Negative for chest pain.  Gastrointestinal: Negative for nausea, vomiting and abdominal pain.  Genitourinary: Negative for difficulty urinating.  Musculoskeletal: Negative for back pain and neck stiffness.  Skin: Negative for rash.  Neurological:  Positive for light-headedness. Negative for syncope and headaches.      Allergies  Bactrim and Penicillins  Home Medications   Prior to Admission medications   Medication Sig Start Date End Date Taking? Authorizing Provider  aspirin 325 MG tablet Take 325 mg by mouth daily. Pt states he only takes this sometimes   Yes Historical Provider, MD  carvedilol (COREG) 6.25 MG tablet Take 1 tablet (6.25 mg total) by mouth 2 (two) times daily with a meal. 07/07/15  Yes Binnie Rail, MD  dabigatran  (PRADAXA) 150 MG CAPS capsule Take 1 capsule (150 mg total) by mouth 2 (two) times daily. 07/07/15  Yes Binnie Rail, MD  DULoxetine (CYMBALTA) 20 MG capsule Take 2 capsules (40 mg total) by mouth daily. 08/18/15  Yes Binnie Rail, MD  finasteride (PROSCAR) 5 MG tablet Take 1 tablet (5 mg total) by mouth daily. 07/07/15  Yes Binnie Rail, MD  fluticasone (FLONASE) 50 MCG/ACT nasal spray SHAKE LIQUID AND USE 2 SPRAYS IN EACH NOSTRIL DAILY AS NEEDED FOR ALLERGIES OR RHINITIS 08/29/15  Yes Binnie Rail, MD  furosemide (LASIX) 20 MG tablet Take 20 mg by mouth daily.    Yes Historical Provider, MD  HYDROcodone-acetaminophen (NORCO) 7.5-325 MG tablet Take 1 tablet by mouth every 6 (six) hours as needed for moderate pain.   Yes Historical Provider, MD  isosorbide mononitrate (IMDUR) 30 MG 24 hr tablet Take 1 tablet (30 mg total) by mouth daily. 07/07/15  Yes Binnie Rail, MD  NITROSTAT 0.4 MG SL tablet Place 0.4 mg under the tongue every 5 (five) minutes as needed. For chest pain 08/13/11  Yes Historical Provider, MD  omeprazole (PRILOSEC) 40 MG capsule Take 1 capsule (40 mg total) by mouth daily. 08/18/15  Yes Binnie Rail, MD  ranitidine (ZANTAC) 300 MG tablet Take 1 tablet (300 mg total) by mouth at bedtime. 08/18/15  Yes Binnie Rail, MD  simvastatin (ZOCOR) 40 MG tablet Take 1 tablet (40 mg total) by mouth at bedtime. 07/07/15  Yes Binnie Rail, MD  solifenacin (VESICARE) 5 MG tablet Take 5 mg by mouth daily.    Yes Historical Provider, MD   BP 127/85 mmHg  Pulse 82  Temp(Src) 98 F (36.7 C) (Oral)  Resp 20  Ht 5' 7.5" (1.715 m)  Wt 160 lb 4.4 oz (72.7 kg)  BMI 24.72 kg/m2  SpO2 100% Physical Exam  Constitutional: He is oriented to person, place, and time. He appears well-developed and well-nourished. No distress.  HENT:  Head: Normocephalic and atraumatic.  Eyes: Conjunctivae and EOM are normal.  Neck: Normal range of motion. JVD present.  Cardiovascular: Normal heart sounds and intact distal  pulses.  An irregularly irregular rhythm present. Tachycardia present.  Exam reveals no gallop and no friction rub.   No murmur heard. Pulmonary/Chest: Effort normal and breath sounds normal. No respiratory distress. He has no wheezes. He has no rales.  Abdominal: Soft. He exhibits no distension. There is no tenderness. There is no guarding.  Musculoskeletal: He exhibits edema (trace bilateral).  Neurological: He is alert and oriented to person, place, and time.  Hard of hearing  Skin: Skin is warm and dry. He is not diaphoretic.  Nursing note and vitals reviewed.   ED Course  Procedures (including critical care time) Labs Review Labs Reviewed  CBC WITH DIFFERENTIAL/PLATELET - Abnormal; Notable for the following:    Hemoglobin 12.8 (*)    RDW 16.3 (*)    Neutro  Abs 8.4 (*)    All other components within normal limits  COMPREHENSIVE METABOLIC PANEL - Abnormal; Notable for the following:    Glucose, Bld 132 (*)    Creatinine, Ser 1.61 (*)    ALT 10 (*)    Total Bilirubin 2.1 (*)    GFR calc non Af Amer 38 (*)    GFR calc Af Amer 44 (*)    All other components within normal limits  BRAIN NATRIURETIC PEPTIDE - Abnormal; Notable for the following:    B Natriuretic Peptide 2667.6 (*)    All other components within normal limits  MAGNESIUM  TROPONIN I  BASIC METABOLIC PANEL  I-STAT TROPOININ, ED  I-STAT TROPOININ, ED  I-STAT TROPOININ, ED    Imaging Review Ct Head Wo Contrast  10/11/2015  CLINICAL DATA:  Intermittent diplopia EXAM: CT HEAD WITHOUT CONTRAST TECHNIQUE: Contiguous axial images were obtained from the base of the skull through the vertex without intravenous contrast. COMPARISON:  02/24/2013 FINDINGS: Global atrophy. Chronic ischemic changes in the periventricular white matter. Lacunar infarcts are present in the bilateral basal ganglia. There is no mass effect, midline shift, or acute hemorrhage. Mastoid air cells are clear. Cranium is intact. IMPRESSION: No acute  intracranial pathology. Chronic ischemic changes and atrophy are noted. Electronically Signed   By: Marybelle Killings M.D.   On: 10/11/2015 14:33   Dg Chest Portable 1 View  10/11/2015  CLINICAL DATA:  82 year old male with a history of shortness of breath. EXAM: PORTABLE CHEST 1 VIEW COMPARISON:  01/23/2014 FINDINGS: Cardiomediastinal silhouette likely unchanged, borderline enlarged. Heart borders obscured by overlying lung and pleural disease. Dense opacities at the lung bases obscuring the bilateral hemidiaphragm and the left retrocardiac region. No pneumothorax. Interlobular septal thickening. Pacing device/AICD on the left chest wall with 2 leads in place, unchanged. No displaced fracture. IMPRESSION: Evidence of congestive heart failure with bilateral pleural effusions and/or atelectasis. Signed, Dulcy Fanny. Earleen Newport, DO [facility] Electronically Signed   By: Corrie Mckusick D.O.   On: 10/11/2015 10:45   I have personally reviewed and evaluated these images and lab results as part of my medical decision-making.   EKG Interpretation   Date/Time:  Saturday October 11 2015 09:59:00 EDT Ventricular Rate:  149 PR Interval:  58 QRS Duration: 120 QT Interval:  308 QTC Calculation: 485 R Axis:   101 Text Interpretation:  Supraventricular tachycardia, suspected Atrial  fibrillation with RVR Consider left ventricular hypertrophy Repolarization  abnormality, prob rate related Since previous ECG, tachycardia has  developed Confirmed by [place] ([id number]) on 10/11/2015 10:27:38  PM       CRITICAL CARE:atrial fibrillation with RVR Performed by: Alvino Chapel   Total critical care time: 30 minutes  Critical care time was exclusive of separately billable procedures and treating other patients.  Critical care was necessary to treat or prevent imminent or life-threatening deterioration.  Critical care was time spent personally  by me on the following activities: development of treatment plan with patient and/or surrogate as well as nursing, discussions with consultants, evaluation of patient's response to treatment, examination of patient, obtaining history from patient or surrogate, ordering and performing treatments and interventions, ordering and review of laboratory studies, ordering and review of radiographic studies, pulse oximetry and re-evaluation of patient's condition.   MDM   Final diagnoses:  Acute on chronic systolic congestive heart failure (HCC)  Atrial fibrillation with RVR ([place])   82 year old male with a history of chronic systolic  heart failure, defibullator in place, hypertension, hyperlipidemia, atrial fibrillation on pradaxa, GERD presents with concern for shortness of breath.  Patient tachycardic to the 140s on arrival, with mild irregularity in rhythm and history of atrial fibrillation and favor atrial fibrillation with RVR over other SVT.  Pt given bolus of 15mg  of diltiazem and started on a drip at 5.  His HR decreased to the 80s, with irregular rhythm on monitor consistent with atrial fibrillation.  CXR shows edema, pt with JVD, and BNP elevated concerning for acute exacerbation of congestive heart failure in setting of atrial fibrillation with RVR. Afib may have been exacerbated by use of Mucinex D.  Ordered 40mg  IV lasix. Patient BP remain stable.  Low suspicion for other etiology of symptoms at this time. Consulted Dr. Coralyn Pear for admission for CHF exacerbation.   Gareth Morgan, MD 10/11/15 2236

## 2015-10-11 NOTE — Progress Notes (Signed)
OFFICE VISIT  10/11/2015   CC:  Chief Complaint  Patient presents with  . Atrial Fibrillation     HPI:    Patient is a 82 y.o. Caucasian male who presents to Saturday clinic for cough and congestion. Onset of ST and cough x 2 wks, SOB last couple days.  No fever.  Trying mucinex.  The SOB has interrupted his sleep a lot lately.  No swelling of legs lately.  Past Medical History  Diagnosis Date  . Chronic systolic heart failure (HCC)     EF 25-30% in past;  Echocardiogram 11/08/11: Normal LV wall thickness, EF 45-50%.  Marland Kitchen HTN (hypertension)   . History of diverticulitis of colon   . GERD (gastroesophageal reflux disease)   . History of nephrolithiasis   . Ischemic cardiomyopathy     s/p ICD 2008 (see CHF for EF)  . Urinary retention     foley indwelling 12/2010-04/2011  . Arthritis   . HLD (hyperlipidemia)   . Coronary artery disease     a. Chronically occ RCA. b. Last cardiac catheterization 10/2010: Mid LAD 20-30%, distal LAD 40%, mid D1 40%, mid circumflex 20-30%, distal RCA occluded with left to right collaterals.  . Benign prostatic hypertrophy   . TIA (transient ischemic attack) 10/2011    carotids neg for ICA stenosis  . Atrial fibrillation ([place])   . Skin cancer   . BPH (benign prostatic hyperplasia)   . Renal insufficiency     Renal failure in July 2012 secondary to hypotension, ARB use, and   . Esophageal stricture   . Hemorrhoids     Past Surgical History  Procedure Laterality Date  . Cardiac defibrillator placement  12/2006    AICD  . Total knee arthroplasty  01/04/11    L TKA  . Cardiac catheterization  11-05-10  . Joint replacement    . Cataracts removed    . Transurethral resection of prostate  06/14/2011    Procedure: TRANSURETHRAL RESECTION OF THE PROSTATE (TURP);  Surgeon: Bernestine Amass, MD;  Location: [facility];  Service: Urology;  Laterality: N/A;  . Esophagogastroduodenoscopy N/A 01/24/2014    Procedure: ESOPHAGOGASTRODUODENOSCOPY (EGD);  Surgeon:  Milus Banister, MD;  Location: [facility];  Service: Endoscopy;  Laterality: N/A;    Outpatient Prescriptions Prior to Visit  Medication Sig Dispense Refill  . aspirin 325 MG tablet Take 325 mg by mouth daily. Pt states he only takes this sometimes (10/08/14)    . carvedilol (COREG) 6.25 MG tablet Take 1 tablet (6.25 mg total) by mouth 2 (two) times daily with a meal. 540 tablet 1  . DULoxetine (CYMBALTA) 20 MG capsule Take 2 capsules (40 mg total) by mouth daily. 180 capsule 0  . finasteride (PROSCAR) 5 MG tablet Take 1 tablet (5 mg total) by mouth daily. 90 tablet 1  . fluticasone (FLONASE) 50 MCG/ACT nasal spray SHAKE LIQUID AND USE 2 SPRAYS IN EACH NOSTRIL DAILY AS NEEDED FOR ALLERGIES OR RHINITIS 16 g 1  . furosemide (LASIX) 20 MG tablet Take 20 mg by mouth daily.     Marland Kitchen HYDROcodone-acetaminophen (NORCO) 7.5-325 MG tablet Take 1 tablet by mouth every 6 (six) hours as needed for moderate pain.    . isosorbide mononitrate (IMDUR) 30 MG 24 hr tablet Take 1 tablet (30 mg total) by mouth daily. 90 tablet 1  . levofloxacin (LEVAQUIN) 500 MG tablet Take 1 tablet (500 mg total) by mouth daily. 7 tablet 0  . NITROSTAT 0.4 MG SL tablet Place  0.4 mg under the tongue every 5 (five) minutes as needed. For chest pain    . omeprazole (PRILOSEC) 40 MG capsule Take 1 capsule (40 mg total) by mouth daily. 30 capsule 5  . ranitidine (ZANTAC) 300 MG tablet Take 1 tablet (300 mg total) by mouth at bedtime. 90 tablet 1  . simvastatin (ZOCOR) 40 MG tablet Take 1 tablet (40 mg total) by mouth at bedtime. 90 tablet 1  . solifenacin (VESICARE) 5 MG tablet Take 5 mg by mouth daily.     . dabigatran (PRADAXA) 150 MG CAPS capsule Take 1 capsule (150 mg total) by mouth 2 (two) times daily. (Patient not taking: Reported on 10/11/2015) 180 capsule 1   No facility-administered medications prior to visit.    Allergies  Allergen Reactions  . Bactrim [Sulfamethoxazole-Trimethoprim] Other (See Comments)    Throat  swelling  . Penicillins Itching, Swelling and Rash    ROS As per HPI  PE: Blood pressure 142/100, pulse 155, temperature 97.9 F (36.6 C), temperature source Oral, height 5\' 8"  (1.727 m), weight 164 lb (74.39 kg), SpO2 98 %. Gen: Alert, well appearing.  Patient is oriented to person, place, time, and situation. ENT: Ears: EACs clear, normal epithelium.  TMs with good light reflex and landmarks bilaterally.  Eyes: no injection, icteris, swelling, or exudate.  EOMI, PERRLA. Nose: no drainage or turbinate edema/swelling.  No injection or focal lesion.  Mouth: lips without lesion/swelling.  Oral mucosa pink and moist.  Dentition intact and without obvious caries or gingival swelling.  Oropharynx without erythema, exudate, or swelling.  Neck - No masses or thyromegaly or limitation in range of motion CV: Regular, tachy to 150s, no m/r/g Chest is clear, no wheezing or rales. Normal symmetric air entry throughout both lung fields. No chest wall deformities or tenderness. EXT: no clubbing, cyanosis, or edema.    LABS:  none  IMPRESSION AND PLAN:  82 y/o WM with CAD, ischemic CM, and atrial fibrillation who now has SOB and tachycardia. I cannot find any pulm abnormality on exam, so I suspect his SOB is secondary to his tachycardia.  I think he needs further w/u in the ED setting so I recommended he go to [facility] ED now.  Pt expressed understanding and agreement with plan.  An After Visit Summary was printed and given to the patient.  FOLLOW UP: prn

## 2015-10-11 NOTE — H&P (Signed)
History and Physical    Jacob [name] [id number] DOB: [birth year] DOA: 10/11/2015  Referring MD/NP/PA:  PCP: Binnie Rail, MD  Outpatient Specialists: Dr. Lovena Le cardiology Patient coming from: Home  Chief Complaint: Shortness of breath  HPI: Jacob [name] is a 82 y.o. male with medical history significant of ischemic cardiomyopathy with his last transthoracic echocardiogram performed on 11/08/2011 that showed an EF of 45-50%, history of atrial fibrillation who had been anticoagulated with Pradaxa, history of ventricular tachycardia, status post ICD implantation, hypertension, dyslipidemia, presenting to the emergency department with complaints of shortness of breath. He reports having shortness of breath over the past several weeks associated with cough with clear sputum production. He also reported palpitations.He is unaware of weight gain and denies extremity edema. He mentions having double vision over the past 24 hours, denies other focal neurological deficits. He resides in the community with adult children. He is able to perform his activities of daily living without assistance.  ED Course: In the emergency department he was found to be in atrial fibrillation with rapid ventricular response having ventricular rates in the 150s. He was given a bolus of IV Cardizem then started on a Cardizem drip. While in the emergency department heart rates came down into the 80s for which Cardizem drip was discontinued. Labs showed BNP of 2667. Chest x-ray revealed evidence of CHF. He was given 40 mg of IV Lasix.  Review of Systems: As per HPI otherwise 10 point review of systems negative.    Past Medical History  Diagnosis Date  . Chronic systolic heart failure (HCC)     EF 25-30% in past;  Echocardiogram 11/08/11: Normal LV wall thickness, EF 45-50%.  Marland Kitchen HTN (hypertension)   . History of diverticulitis of colon   . GERD (gastroesophageal reflux disease)   . History of nephrolithiasis   .  Ischemic cardiomyopathy     s/p ICD 2008 (see CHF for EF)  . Urinary retention     foley indwelling 12/2010-04/2011  . Arthritis   . HLD (hyperlipidemia)   . Coronary artery disease     a. Chronically occ RCA. b. Last cardiac catheterization 10/2010: Mid LAD 20-30%, distal LAD 40%, mid D1 40%, mid circumflex 20-30%, distal RCA occluded with left to right collaterals.  . Benign prostatic hypertrophy   . TIA (transient ischemic attack) 10/2011    carotids neg for ICA stenosis  . Atrial fibrillation ([place])   . Skin cancer   . BPH (benign prostatic hyperplasia)   . Renal insufficiency     Renal failure in July 2012 secondary to hypotension, ARB use, and   . Esophageal stricture   . Hemorrhoids     Past Surgical History  Procedure Laterality Date  . Cardiac defibrillator placement  12/2006    AICD  . Total knee arthroplasty  01/04/11    L TKA  . Cardiac catheterization  11-05-10  . Joint replacement    . Cataracts removed    . Transurethral resection of prostate  06/14/2011    Procedure: TRANSURETHRAL RESECTION OF THE PROSTATE (TURP);  Surgeon: Bernestine Amass, MD;  Location: [facility];  Service: Urology;  Laterality: N/A;  . Esophagogastroduodenoscopy N/A 01/24/2014    Procedure: ESOPHAGOGASTRODUODENOSCOPY (EGD);  Surgeon: Milus Banister, MD;  Location: [facility];  Service: Endoscopy;  Laterality: N/A;     reports that he quit smoking about 47 years ago. He has never used smokeless tobacco. He reports that he does not drink alcohol or use illicit  drugs.  Allergies  Allergen Reactions  . Bactrim [Sulfamethoxazole-Trimethoprim] Other (See Comments)    Throat swelling  . Penicillins Itching, Swelling and Rash    Family History  Problem Relation Age of Onset  . Heart disease Son     x 2  . Colon cancer Neg Hx   . Heart attack Mother 71    pt unsure of age  . Liver disease Father 41    Prior to Admission medications   Medication Sig Start Date End Date Taking? Authorizing  Provider  aspirin 325 MG tablet Take 325 mg by mouth daily. Pt states he only takes this sometimes   Yes Historical Provider, MD  carvedilol (COREG) 6.25 MG tablet Take 1 tablet (6.25 mg total) by mouth 2 (two) times daily with a meal. 07/07/15  Yes Binnie Rail, MD  DULoxetine (CYMBALTA) 20 MG capsule Take 2 capsules (40 mg total) by mouth daily. 08/18/15  Yes Binnie Rail, MD  finasteride (PROSCAR) 5 MG tablet Take 1 tablet (5 mg total) by mouth daily. 07/07/15  Yes Binnie Rail, MD  fluticasone (FLONASE) 50 MCG/ACT nasal spray SHAKE LIQUID AND USE 2 SPRAYS IN EACH NOSTRIL DAILY AS NEEDED FOR ALLERGIES OR RHINITIS 08/29/15  Yes Binnie Rail, MD  furosemide (LASIX) 20 MG tablet Take 20 mg by mouth daily.    Yes Historical Provider, MD  HYDROcodone-acetaminophen (NORCO) 7.5-325 MG tablet Take 1 tablet by mouth every 6 (six) hours as needed for moderate pain.   Yes Historical Provider, MD  isosorbide mononitrate (IMDUR) 30 MG 24 hr tablet Take 1 tablet (30 mg total) by mouth daily. 07/07/15  Yes Binnie Rail, MD  NITROSTAT 0.4 MG SL tablet Place 0.4 mg under the tongue every 5 (five) minutes as needed. For chest pain 08/13/11  Yes Historical Provider, MD  omeprazole (PRILOSEC) 40 MG capsule Take 1 capsule (40 mg total) by mouth daily. 08/18/15  Yes Binnie Rail, MD  ranitidine (ZANTAC) 300 MG tablet Take 1 tablet (300 mg total) by mouth at bedtime. 08/18/15  Yes Binnie Rail, MD  simvastatin (ZOCOR) 40 MG tablet Take 1 tablet (40 mg total) by mouth at bedtime. 07/07/15  Yes Binnie Rail, MD  solifenacin (VESICARE) 5 MG tablet Take 5 mg by mouth daily.    Yes Historical Provider, MD  dabigatran (PRADAXA) 150 MG CAPS capsule Take 1 capsule (150 mg total) by mouth 2 (two) times daily. Patient not taking: Reported on 10/11/2015 07/07/15   Binnie Rail, MD    Physical Exam: Filed Vitals:   10/11/15 1120 10/11/15 1125 10/11/15 1211 10/11/15 1219  BP: 117/76   193/95  Pulse: 78 82 87 86  Temp:      Resp:  26 19 18 18   SpO2: 95% 95% 98% 97%      Constitutional: NAD, calm, comfortable, states feeling much better breathing easier no acute distress. Filed Vitals:   10/11/15 1120 10/11/15 1125 10/11/15 1211 10/11/15 1219  BP: 117/76   193/95  Pulse: 78 82 87 86  Temp:      Resp: 26 19 18 18   SpO2: 95% 95% 98% 97%   Eyes: PERRL, lids and conjunctivae normal ENMT: Mucous membranes are moist. Posterior pharynx clear of any exudate or lesions.Normal dentition.  Neck: normal, supple, no masses, no thyromegaly Respiratory: He has bibasilar crackles, on supplemental oxygen, normal respiratory effort, does not appear to be using accessory muscles Cardiovascular: Irregular rate and rhythm, no murmurs / rubs /  gallops. No extremity edema. 2+ pedal pulses. No carotid bruits. He did not have extremity edema Abdomen: no tenderness, no masses palpated. No hepatosplenomegaly. Bowel sounds positive.  Musculoskeletal: no clubbing / cyanosis. No joint deformity upper and lower extremities. Good ROM, no contractures. Normal muscle tone.  Skin: no rashes, lesions, ulcers. No induration Neurologic: CN 2-12 grossly intact. Sensation intact, DTR normal. Strength 5/5 in all 4.  Psychiatric: Normal judgment and insight. Alert and oriented x 3. Normal mood.    Labs on Admission: I have personally reviewed following labs and imaging studies  CBC:  Recent Labs Lab 10/11/15 0950  WBC 10.5  NEUTROABS 8.4*  HGB 12.8*  HCT 39.1  MCV 86.7  PLT 123456   Basic Metabolic Panel:  Recent Labs Lab 10/11/15 0950  NA 142  K 4.2  CL 110  CO2 22  GLUCOSE 132*  BUN 20  CREATININE 1.61*  CALCIUM 9.2  MG 2.1   GFR: Estimated Creatinine Clearance: 34.2 mL/min (by C-G formula based on Cr of 1.61). Liver Function Tests:  Recent Labs Lab 10/11/15 0950  AST 17  ALT 10*  ALKPHOS 81  BILITOT 2.1*  PROT 8.0  ALBUMIN 4.2   No results for input(s): LIPASE, AMYLASE in the last 168 hours. No results for  input(s): AMMONIA in the last 168 hours. Coagulation Profile: No results for input(s): INR, PROTIME in the last 168 hours. Cardiac Enzymes:  Recent Labs Lab 10/11/15 0950  TROPONINI <0.03   BNP (last 3 results) No results for input(s): PROBNP in the last 8760 hours. HbA1C: No results for input(s): HGBA1C in the last 72 hours. CBG: No results for input(s): GLUCAP in the last 168 hours. Lipid Profile: No results for input(s): CHOL, HDL, LDLCALC, TRIG, CHOLHDL, LDLDIRECT in the last 72 hours. Thyroid Function Tests: No results for input(s): TSH, T4TOTAL, FREET4, T3FREE, THYROIDAB in the last 72 hours. Anemia Panel: No results for input(s): VITAMINB12, FOLATE, FERRITIN, TIBC, IRON, RETICCTPCT in the last 72 hours. Urine analysis:    Component Value Date/Time   COLORURINE AMBER* 12/02/2011 2131   APPEARANCEUR CLEAR 12/02/2011 2131   LABSPEC 1.030 12/02/2011 2131   PHURINE 5.5 12/02/2011 2131   GLUCOSEU NEGATIVE 12/02/2011 2131   GLUCOSEU NEGATIVE 05/19/2011 1315   HGBUR NEGATIVE 12/02/2011 2131   BILIRUBINUR SMALL* 12/02/2011 2131   [place] NEGATIVE 12/02/2011 2131   PROTEINUR 30* 12/02/2011 2131   UROBILINOGEN 0.2 12/02/2011 2131   NITRITE NEGATIVE 12/02/2011 2131   LEUKOCYTESUR NEGATIVE 12/02/2011 2131   Sepsis Labs: @LABRCNTIP (procalcitonin:4,lacticidven:4) )No results found for this or any previous visit (from the past 240 hour(s)).   Radiological Exams on Admission: Dg Chest Portable 1 View  10/11/2015  CLINICAL DATA:  82 year old male with a history of shortness of breath. EXAM: PORTABLE CHEST 1 VIEW COMPARISON:  01/23/2014 FINDINGS: Cardiomediastinal silhouette likely unchanged, borderline enlarged. Heart borders obscured by overlying lung and pleural disease. Dense opacities at the lung bases obscuring the bilateral hemidiaphragm and the left retrocardiac region. No pneumothorax. Interlobular septal thickening. Pacing device/AICD on the left chest wall with 2 leads  in place, unchanged. No displaced fracture. IMPRESSION: Evidence of congestive heart failure with bilateral pleural effusions and/or atelectasis. Signed, Dulcy Fanny. Earleen Newport, DO [facility] Electronically Signed   By: Corrie Mckusick D.O.   On: 10/11/2015 10:45    EKG: Independently reviewed.   Assessment/Plan Principal Problem:   Atrial fibrillation (HCC) Active Problems:   Acute on chronic systolic (congestive) heart failure ([place])  Automatic implantable cardioverter-defibrillator in situ   Cardiomyopathy (HCC)   Diplopia  1.  Acute on chronic systolic congestive heart failure. Mr Derda has a history of systolic congestive heart failure with his last transthoracic echocardiogram performed in 2013 showing ejection fraction of 45-50%. He had been taking Lasix 20 mg by mouth daily. He presented with cervical signs symptoms consistent with CHF as a chest x-ray showed interstitial edema and labs revealed elevated BNP of 2667. I suspect that A. fib with RVR likely precipitated acute CHF. Will place Mr Mandl on CHF protocol, begin Lasix 40 mg IV twice daily, monitor input and output, continue beta blocker, admit to telemetry. Plan to repeat transthoracic echocardiogram since his last one was done in 2013.  2.  Atrial fibrillation with rapid ventricular response. He presented with ventricular rates in the 150s started on a Cardizem drip in the emergency department. After ventricular rates improving to the 80s Cardizem drip was discontinued. I suspect this precipitated his CHF. Will place him on metoprolol 25 mg by mouth twice a day and continue his anticoagulation with Pradaxa. Will consult pharmacy regarding proper dosing of Pradaxa having creatinine 1.6. Of note when I asked Mr Seebeck how he was taken his medications he reported taking 2 tablets of Pradaxa in the morning rather than 1 tablet twice daily. I went over the proper dosing of this medication.    3.  Diplopia. Patient reporting developing diplopia in the past 24 hours. Has history of AICD placement. Will check a CT scan of brain.   4.  Hypertension. Plan to treat with metoprolol 25 mg by mouth twice a day and imdur 30 mg by mouth daily, monitor blood pressures.   5.  Status post AICD placement. On his last cardiology visit with Dr. Lovena Le, then discussed device reaching ERI and doing a repeat echo to assess ejection fraction. Dr. Lovena Le recommended not placing a new device if EF was greater than 35%. I have ordered echo on this admission.  6.  History of coronary artery disease.  HHe currently denies anginal symptoms, troponin negative.  7.  Dyslipidemia. Continue Zocor 40 mg by mouth daily   DVT prophylaxis: Fully anticoagulated with Pradaxa Code Status: DO NOT RESUSCITATE Family Communication: I spoke to his adult children who are present at bedside Disposition Plan: Anticipate he may require greater than 2 nights hospitalization Consults called:  Admission status: Plan to admit to telemetry   Kelvin Cellar MD [facility] Pager [phone]  If 7PM-7AM, please contact night-coverage [email or web address] Password [facility]  10/11/2015, 12:28 PM

## 2015-10-11 NOTE — Progress Notes (Signed)
ANTICOAGULATION CONSULT NOTE - Initial Consult  Pharmacy Consult for Dabigatran Indication: atrial fibrillation  Allergies  Allergen Reactions  . Bactrim [Sulfamethoxazole-Trimethoprim] Other (See Comments)    Throat swelling  . Penicillins Itching, Swelling and Rash    Patient Measurements:    Vital Signs: Temp: 97.7 F (36.5 C) (04/29 0955) Temp Source: Oral (04/29 0912) BP: 142/97 mmHg (04/29 1229) Pulse Rate: 90 (04/29 1229)  Labs:  Recent Labs  10/11/15 0950  HGB 12.8*  HCT 39.1  PLT 225  CREATININE 1.61*  TROPONINI <0.03    Estimated Creatinine Clearance: 34.2 mL/min (by C-G formula based on Cr of 1.61).   Medical History: Past Medical History  Diagnosis Date  . Chronic systolic heart failure (HCC)     EF 25-30% in past;  Echocardiogram 11/08/11: Normal LV wall thickness, EF 45-50%.  Marland Kitchen HTN (hypertension)   . History of diverticulitis of colon   . GERD (gastroesophageal reflux disease)   . History of nephrolithiasis   . Ischemic cardiomyopathy     s/p ICD 2008 (see CHF for EF)  . Urinary retention     foley indwelling 12/2010-04/2011  . Arthritis   . HLD (hyperlipidemia)   . Coronary artery disease     a. Chronically occ RCA. b. Last cardiac catheterization 10/2010: Mid LAD 20-30%, distal LAD 40%, mid D1 40%, mid circumflex 20-30%, distal RCA occluded with left to right collaterals.  . Benign prostatic hypertrophy   . TIA (transient ischemic attack) 10/2011    carotids neg for ICA stenosis  . Atrial fibrillation ([place])   . Skin cancer   . BPH (benign prostatic hyperplasia)   . Renal insufficiency     Renal failure in July 2012 secondary to hypotension, ARB use, and   . Esophageal stricture   . Hemorrhoids     Medications:  Prescriptions prior to admission  Medication Sig Dispense Refill Last Dose  . aspirin 325 MG tablet Take 325 mg by mouth daily. Pt states he only takes this sometimes   10/10/2015 at Unknown time  . carvedilol (COREG) 6.25 MG  tablet Take 1 tablet (6.25 mg total) by mouth 2 (two) times daily with a meal. 540 tablet 1 10/10/2015 at 2000  . dabigatran (PRADAXA) 150 MG CAPS capsule Take 1 capsule (150 mg total) by mouth 2 (two) times daily. 180 capsule 1 10/10/2015 at 2000  . DULoxetine (CYMBALTA) 20 MG capsule Take 2 capsules (40 mg total) by mouth daily. 180 capsule 0 10/10/2015 at Unknown time  . finasteride (PROSCAR) 5 MG tablet Take 1 tablet (5 mg total) by mouth daily. 90 tablet 1 10/10/2015 at Unknown time  . fluticasone (FLONASE) 50 MCG/ACT nasal spray SHAKE LIQUID AND USE 2 SPRAYS IN EACH NOSTRIL DAILY AS NEEDED FOR ALLERGIES OR RHINITIS 16 g 1 10/10/2015 at Unknown time  . furosemide (LASIX) 20 MG tablet Take 20 mg by mouth daily.    10/10/2015 at Unknown time  . HYDROcodone-acetaminophen (NORCO) 7.5-325 MG tablet Take 1 tablet by mouth every 6 (six) hours as needed for moderate pain.   Past Week at Unknown time  . isosorbide mononitrate (IMDUR) 30 MG 24 hr tablet Take 1 tablet (30 mg total) by mouth daily. 90 tablet 1 10/10/2015 at Unknown time  . NITROSTAT 0.4 MG SL tablet Place 0.4 mg under the tongue every 5 (five) minutes as needed. For chest pain   unknown  . omeprazole (PRILOSEC) 40 MG capsule Take 1 capsule (40 mg total) by mouth daily. 30 capsule  5 10/10/2015 at Unknown time  . ranitidine (ZANTAC) 300 MG tablet Take 1 tablet (300 mg total) by mouth at bedtime. 90 tablet 1 10/10/2015 at Unknown time  . simvastatin (ZOCOR) 40 MG tablet Take 1 tablet (40 mg total) by mouth at bedtime. 90 tablet 1 10/10/2015 at Unknown time  . solifenacin (VESICARE) 5 MG tablet Take 5 mg by mouth daily.    10/10/2015 at Unknown time    Assessment: 82yo M admitted with SOB. Found to be in Afib with RVR. SCr is elevated, CrCl ~47ml/min. Pharmacy is asked to assist with dosing. Patient reported to MD that he was taking his dabigatran 150mg  x 2 tablets once a day rather than 1 tablet bid.    Goal of Therapy:  Monitor platelets by  anticoagulation protocol: Yes   Plan:  Continue dabigatran 150mg  PO bid.  F/u daily.  Re-educate on dabigatran.   Romeo Rabon, PharmD, pager [phone]. 10/11/2015,1:07 PM.

## 2015-10-11 NOTE — ED Notes (Signed)
Pt from home c/o SOB x2 days and heart racing. Pt reports congestion and cough x1 week. Pt sts that he has a defibrillator. Pt HR found to be 149 in triage. Pt is A&O and in NAD

## 2015-10-11 NOTE — Progress Notes (Signed)
Pre visit review using our clinic review tool, if applicable. No additional management support is needed unless otherwise documented below in the visit note. 

## 2015-10-12 DIAGNOSIS — I484 Atypical atrial flutter: Secondary | ICD-10-CM

## 2015-10-12 DIAGNOSIS — I5023 Acute on chronic systolic (congestive) heart failure: Secondary | ICD-10-CM

## 2015-10-12 DIAGNOSIS — I429 Cardiomyopathy, unspecified: Secondary | ICD-10-CM

## 2015-10-12 DIAGNOSIS — H532 Diplopia: Secondary | ICD-10-CM

## 2015-10-12 DIAGNOSIS — Z95810 Presence of automatic (implantable) cardiac defibrillator: Secondary | ICD-10-CM

## 2015-10-12 LAB — BASIC METABOLIC PANEL
ANION GAP: 12 (ref 5–15)
BUN: 23 mg/dL — ABNORMAL HIGH (ref 6–20)
CO2: 27 mmol/L (ref 22–32)
Calcium: 9 mg/dL (ref 8.9–10.3)
Chloride: 105 mmol/L (ref 101–111)
Creatinine, Ser: 1.8 mg/dL — ABNORMAL HIGH (ref 0.61–1.24)
GFR calc non Af Amer: 33 mL/min — ABNORMAL LOW (ref >60)
GFR, EST AFRICAN AMERICAN: 39 mL/min — AB (ref >60)
GLUCOSE: 112 mg/dL — AB (ref 65–99)
POTASSIUM: 3.5 mmol/L (ref 3.5–5.1)
Sodium: 144 mmol/L (ref 135–145)

## 2015-10-12 LAB — TROPONIN I
TROPONIN I: 0.05 ng/mL — AB (ref <0.031)
Troponin I: 0.05 ng/mL — ABNORMAL HIGH (ref <0.031)

## 2015-10-12 LAB — PROTIME-INR
INR: 1.57 — ABNORMAL HIGH (ref 0.00–1.49)
Prothrombin Time: 18.8 seconds — ABNORMAL HIGH (ref 11.6–15.2)

## 2015-10-12 LAB — APTT: aPTT: 54 seconds — ABNORMAL HIGH (ref 24–37)

## 2015-10-12 MED ORDER — FUROSEMIDE 10 MG/ML IJ SOLN
40.0000 mg | Freq: Every day | INTRAMUSCULAR | Status: DC
  Administered 2015-10-13 – 2015-10-14 (×2): 40 mg via INTRAVENOUS
  Filled 2015-10-12 (×2): qty 4

## 2015-10-12 MED ORDER — HEPARIN (PORCINE) IN NACL 100-0.45 UNIT/ML-% IJ SOLN
1150.0000 [IU]/h | INTRAMUSCULAR | Status: DC
  Administered 2015-10-12: 950 [IU]/h via INTRAVENOUS
  Administered 2015-10-14: 1150 [IU]/h via INTRAVENOUS
  Filled 2015-10-12 (×3): qty 250

## 2015-10-12 NOTE — Progress Notes (Signed)
Report called to Deneise Lever, RN on 3E at [facility]. All questions answered. [facility] aware of need for transfer and they anticipate transport before 1800. Pt's dtr, Marcie Bal, called at pt request and made aware of transfer and room number. Pt and dtr both agreeable to transfer.

## 2015-10-12 NOTE — Progress Notes (Signed)
PROGRESS NOTE    Jacob [name]  [id number] DOB: 1932/05/27 DOA: 10/11/2015 PCP: Binnie Rail, MD  Outpatient Specialists: Dr. Cristopher Peru, cardiology   Brief Narrative:  Jacob [name] is a 82 y.o. male with medical history significant of ischemic cardiomyopathy with his last transthoracic echocardiogram performed on 11/08/2011 that showed an EF of 45-50%, history of atrial fibrillation who had been anticoagulated with Pradaxa, history of ventricular tachycardia, status post ICD implantation, hypertension, dyslipidemia, presenting to the emergency department with complaints of shortness of breath. He reports having shortness of breath over the past several weeks associated with cough with clear sputum production. He also reported palpitations.He is unaware of weight gain and denies extremity edema. He mentions having double vision over the past 24 hours, denies other focal neurological deficits. He resides in the community with adult children. He is able to perform his activities of daily living without assistance.  Echocardiogram showed decreased EF 15-20%. Cardiology consulted and requested patient be transferred to [facility].   Assessment & Plan   Acute on chronic systolic congestive heart failure -Echocardiogram in 2013 showed EF of 45-50% -Patient was taking Lasix 20 mg by mouth at home -Upon admission, chest x-ray did show interstitial edema with an elevated BNP of 2667 -CHF protocol was started, patient was placed on Lasix 40 mg IV twice a day -Monitor intake and output, daily weights -Echocardiogram repeated showing EF 15-20% -Cardiology consulted and appreciated. Requested patient be transferred to [facility] for further cardiac evaluation -Currently not on ACE inhibitor due to worsening creatinine -Continue metoprolol  Atrial fibrillation/Atrial Flutter with rapid ventricular response. -Continue Pradaxa -Upon admission to the emergency department, patient did have heart rates in  the 150s and was placed on Cardizem drip. His ventricular rates to improve, drip was discontinued. -Continue metoprolol -CHADSVASC 7  Diplopia -Patient reporting developing diplopia in the past 24 hours. Has history of AICD placement -CT head showed no acute intracranial pathology -Patient does appear to be improving  Hypertension -Continue metoprolol, imdur -Continue laxi  Status post AICD placement -On his last cardiology visit with Dr. Lovena Le, then discussed device reaching ERI and doing a repeat echo to assess ejection fraction. Dr. Lovena Le recommended not placing a new device if EF was greater than 35%.  -Cardiology consulted and appreciated  History of coronary artery disease.  -Troponin negative -No complaints of chest pain however echocardiogram does show decreased EF 15-20%. -Discussed with Dr. Debara Pickett, cardiology. Patient be transferred for further cardiac workup.  Dyslipidemia -Continue Zocor 40 mg by mouth daily  Acute on Chronic kidney disease, stage III -Creatinine currently 1.8 however GFR does appear to be in stage III -Continue to monitor BMP closely as patient will be diuresing.  DVT Prophylaxis  Pradaxa  Code Status: DNR   Family Communication: None at bedside  Disposition Plan: Admitted. [facility] to [facility], cardiology service.  Consultants Cardiology  Procedures  Echocardiogram  Antibiotics   Anti-infectives    None      Subjective:   Jacob [name] seen and examined today.  Patient States he is feeling better this morning. Denies any chest pain, cough, shortness of breath, abdominal pain. Patient feels that his blurry vision has improved.  Objective:   Filed Vitals:   10/11/15 1219 10/11/15 1229 10/11/15 1259 10/12/15 0514  BP: 193/95 142/97 127/85 129/70  Pulse: 86 90 82 72  Temp:   98 F (36.7 C) 97.7 F (36.5 C)  TempSrc:   Oral Oral  Resp: 18  20 20  Weight:    70.4 kg (155 lb 3.3 oz)  SpO2: 97%  100% 100%     Intake/Output Summary (Last 24 hours) at 10/12/15 0958 Last data filed at 10/11/15 1650  Gross per 24 hour  Intake      0 ml  Output    450 ml  Net   -450 ml   Filed Weights   10/12/15 0514  Weight: 70.4 kg (155 lb 3.3 oz)    Exam  General: Well developed, thin, NAD  HEENT: NCAT,  mucous membranes moist.   Neck: Supple, + JVD, no masses  Cardiovascular: S1 S2 auscultated, irregularly irregular  Respiratory: Diminished breath sounds, bibasilar rales  Abdomen: Soft, nontender, nondistended, + bowel sounds  Extremities: warm dry without cyanosis clubbing or edema  Neuro: AAOx3, nonfocal   Skin: Without rashes exudates or nodules  Psych: Normal affect and demeanor   Data Reviewed: I have personally reviewed following labs and imaging studies  CBC:  Recent Labs Lab 10/11/15 0950  WBC 10.5  NEUTROABS 8.4*  HGB 12.8*  HCT 39.1  MCV 86.7  PLT 123456   Basic Metabolic Panel:  Recent Labs Lab 10/11/15 0950 10/12/15 0505  NA 142 144  K 4.2 3.5  CL 110 105  CO2 22 27  GLUCOSE 132* 112*  BUN 20 23*  CREATININE 1.61* 1.80*  CALCIUM 9.2 9.0  MG 2.1  --    GFR: Estimated Creatinine Clearance: 30.6 mL/min (by C-G formula based on Cr of 1.8). Liver Function Tests:  Recent Labs Lab 10/11/15 0950  AST 17  ALT 10*  ALKPHOS 81  BILITOT 2.1*  PROT 8.0  ALBUMIN 4.2   No results for input(s): LIPASE, AMYLASE in the last 168 hours. No results for input(s): AMMONIA in the last 168 hours. Coagulation Profile: No results for input(s): INR, PROTIME in the last 168 hours. Cardiac Enzymes:  Recent Labs Lab 10/11/15 0950  TROPONINI <0.03   BNP (last 3 results) No results for input(s): PROBNP in the last 8760 hours. HbA1C: No results for input(s): HGBA1C in the last 72 hours. CBG: No results for input(s): GLUCAP in the last 168 hours. Lipid Profile: No results for input(s): CHOL, HDL, LDLCALC, TRIG, CHOLHDL, LDLDIRECT in the last 72 hours. Thyroid  Function Tests: No results for input(s): TSH, T4TOTAL, FREET4, T3FREE, THYROIDAB in the last 72 hours. Anemia Panel: No results for input(s): VITAMINB12, FOLATE, FERRITIN, TIBC, IRON, RETICCTPCT in the last 72 hours. Urine analysis:    Component Value Date/Time   COLORURINE AMBER* 12/02/2011 2131   APPEARANCEUR CLEAR 12/02/2011 2131   LABSPEC 1.030 12/02/2011 2131   PHURINE 5.5 12/02/2011 2131   GLUCOSEU NEGATIVE 12/02/2011 2131   GLUCOSEU NEGATIVE 05/19/2011 1315   HGBUR NEGATIVE 12/02/2011 2131   BILIRUBINUR SMALL* 12/02/2011 2131   [place] NEGATIVE 12/02/2011 2131   PROTEINUR 30* 12/02/2011 2131   UROBILINOGEN 0.2 12/02/2011 2131   NITRITE NEGATIVE 12/02/2011 2131   LEUKOCYTESUR NEGATIVE 12/02/2011 2131   Sepsis Labs: @LABRCNTIP (procalcitonin:4,lacticidven:4)  )No results found for this or any previous visit (from the past 240 hour(s)).    Radiology Studies: Ct Head Wo Contrast  10/11/2015  CLINICAL DATA:  Intermittent diplopia EXAM: CT HEAD WITHOUT CONTRAST TECHNIQUE: Contiguous axial images were obtained from the base of the skull through the vertex without intravenous contrast. COMPARISON:  02/24/2013 FINDINGS: Global atrophy. Chronic ischemic changes in the periventricular white matter. Lacunar infarcts are present in the bilateral basal ganglia. There is no mass effect, midline shift,  or acute hemorrhage. Mastoid air cells are clear. Cranium is intact. IMPRESSION: No acute intracranial pathology. Chronic ischemic changes and atrophy are noted. Electronically Signed   By: Marybelle Killings M.D.   On: 10/11/2015 14:33   Dg Chest Portable 1 View  10/11/2015  CLINICAL DATA:  82 year old male with a history of shortness of breath. EXAM: PORTABLE CHEST 1 VIEW COMPARISON:  01/23/2014 FINDINGS: Cardiomediastinal silhouette likely unchanged, borderline enlarged. Heart borders obscured by overlying lung and pleural disease. Dense opacities at the lung bases obscuring the bilateral  hemidiaphragm and the left retrocardiac region. No pneumothorax. Interlobular septal thickening. Pacing device/AICD on the left chest wall with 2 leads in place, unchanged. No displaced fracture. IMPRESSION: Evidence of congestive heart failure with bilateral pleural effusions and/or atelectasis. Signed, Dulcy Fanny. Earleen Newport, DO [facility] Radiology Electronically Signed   By: Corrie Mckusick D.O.   On: 10/11/2015 10:45     Scheduled Meds: . antiseptic oral rinse  7 mL Mouth Rinse BID  . DULoxetine  40 mg Oral Daily  . feeding supplement (ENSURE ENLIVE)  237 mL Oral BID BM  . finasteride  5 mg Oral Daily  . fluticasone  1 spray Each Nare Daily  . [START ON 10/13/2015] furosemide  40 mg Intravenous Daily  . isosorbide mononitrate  30 mg Oral Daily  . metoprolol tartrate  25 mg Oral BID  . pantoprazole  40 mg Oral Daily  . rosuvastatin  10 mg Oral QHS  . sodium chloride flush  3 mL Intravenous Q12H   Continuous Infusions:    LOS: 1 day   Time Spent in minutes   30 minutes  Riannon Mukherjee D.O. on 10/12/2015 at 9:58 AM  Between 7am to 7pm - Pager - [phone]  After 7pm go to [email or web address] - password [email or web address]  And look for the night coverage person covering for me after hours  [facility] Office  [phone]

## 2015-10-12 NOTE — Consult Note (Signed)
CONSULTATION NOTE  Reason for Consult: A-fib with RVR, worsening cardiomyopathy  Requesting Physician: Dr. Ree Kida  Cardiologist: Dr. Cristopher Peru  HPI: This is a 82 y.o. male with a past medical history significant for ischemic cardiomyopathy, EF as low as 25-30%, recently up to 16-07%, chronic systolic congestive heart failure, HTN, dyslipidemia, history of VT and atrial fibrillation s/p AICD inplantation followed by Dr. Cristopher Peru. Recently seen in the office and his AICD was noted to be at [facility] - Dr. Lovena Le wished to re-assess LVEF to determine if AICD generator change was necessary.  He now presents with progressive DOE. He was found to be in a-fib with RVR, started on cardizem. BNP is elevated at 2667. CHR consistent with CHF. He has been on pradaxa for anticoagulation. He denies any chest pain with this event. Initial troponin was negative. Diuresed overnight and creatinine has risen to 1.8 (from 1.6) - only 450 cc recorded urine output. Echo performed yesterday shows EF has significantly reduced to 15-20% with akinesis of the anteroseptal, anterior and apical walls and inferior hypokinesis, there is moderate LAE and mild RAE, a trivial pericardial effusion is noted.    PMHx:  Past Medical History  Diagnosis Date  . Chronic systolic heart failure (HCC)     EF 25-30% in past;  Echocardiogram 11/08/11: Normal LV wall thickness, EF 45-50%.  [place] HTN (hypertension)   . History of diverticulitis of colon   . GERD (gastroesophageal reflux disease)   . History of nephrolithiasis   . Ischemic cardiomyopathy     s/p ICD 2008 (see CHF for EF)  . Urinary retention     foley indwelling 12/2010-04/2011  . Arthritis   . HLD (hyperlipidemia)   . Coronary artery disease     a. Chronically occ RCA. b. Last cardiac catheterization 10/2010: Mid LAD 20-30%, distal LAD 40%, mid D1 40%, mid circumflex 20-30%, distal RCA occluded with left to right collaterals.  . Benign prostatic hypertrophy   .  TIA (transient ischemic attack) 10/2011    carotids neg for ICA stenosis  . Atrial fibrillation ([place])   . Skin cancer   . BPH (benign prostatic hyperplasia)   . Renal insufficiency     Renal failure in July 2012 secondary to hypotension, ARB use, and   . Esophageal stricture   . Hemorrhoids    Past Surgical History  Procedure Laterality Date  . Cardiac defibrillator placement  12/2006    AICD  . Total knee arthroplasty  01/04/11    L TKA  . Cardiac catheterization  11-05-10  . Joint replacement    . Cataracts removed    . Transurethral resection of prostate  06/14/2011    Procedure: TRANSURETHRAL RESECTION OF THE PROSTATE (TURP);  Surgeon: Bernestine Amass, MD;  Location: [facility];  Service: Urology;  Laterality: N/A;  . Esophagogastroduodenoscopy N/A 01/24/2014    Procedure: ESOPHAGOGASTRODUODENOSCOPY (EGD);  Surgeon: Milus Banister, MD;  Location: [facility];  Service: Endoscopy;  Laterality: N/A;    FAMHx: Family History  Problem Relation Age of Onset  . Heart disease Son     x 2  . Colon cancer Neg Hx   . Heart attack Mother 53    pt unsure of age  . Liver disease Father 66    SOCHx:  reports that he quit smoking about 47 years ago. He has never used smokeless tobacco. He reports that he does not drink alcohol or use illicit drugs.  ALLERGIES: Allergies  Allergen Reactions  .  Bactrim [Sulfamethoxazole-Trimethoprim] Other (See Comments)    Throat swelling  . Penicillins Itching, Swelling and Rash    ROS: Pertinent items noted in HPI and remainder of comprehensive ROS otherwise negative.  HOME MEDICATIONS:   Medication List    ASK your doctor about these medications        aspirin 325 MG tablet  Take 325 mg by mouth daily. Pt states he only takes this sometimes     carvedilol 6.25 MG tablet  Commonly known as:  COREG  Take 1 tablet (6.25 mg total) by mouth 2 (two) times daily with a meal.     dabigatran 150 MG Caps capsule  Commonly known as:  PRADAXA    Take 1 capsule (150 mg total) by mouth 2 (two) times daily.     DULoxetine 20 MG capsule  Commonly known as:  CYMBALTA  Take 2 capsules (40 mg total) by mouth daily.     finasteride 5 MG tablet  Commonly known as:  PROSCAR  Take 1 tablet (5 mg total) by mouth daily.     fluticasone 50 MCG/ACT nasal spray  Commonly known as:  FLONASE  SHAKE LIQUID AND USE 2 SPRAYS IN EACH NOSTRIL DAILY AS NEEDED FOR ALLERGIES OR RHINITIS     furosemide 20 MG tablet  Commonly known as:  LASIX  Take 20 mg by mouth daily.     HYDROcodone-acetaminophen 7.5-325 MG tablet  Commonly known as:  NORCO  Take 1 tablet by mouth every 6 (six) hours as needed for moderate pain.     isosorbide mononitrate 30 MG 24 hr tablet  Commonly known as:  IMDUR  Take 1 tablet (30 mg total) by mouth daily.     NITROSTAT 0.4 MG SL tablet  Generic drug:  nitroGLYCERIN  Place 0.4 mg under the tongue every 5 (five) minutes as needed. For chest pain     omeprazole 40 MG capsule  Commonly known as:  PRILOSEC  Take 1 capsule (40 mg total) by mouth daily.     ranitidine 300 MG tablet  Commonly known as:  ZANTAC  Take 1 tablet (300 mg total) by mouth at bedtime.     simvastatin 40 MG tablet  Commonly known as:  ZOCOR  Take 1 tablet (40 mg total) by mouth at bedtime.     solifenacin 5 MG tablet  Commonly known as:  VESICARE  Take 5 mg by mouth daily.        HOSPITAL MEDICATIONS: Prior to Admission:  Prescriptions prior to admission  Medication Sig Dispense Refill Last Dose  . aspirin 325 MG tablet Take 325 mg by mouth daily. Pt states he only takes this sometimes   10/10/2015 at Unknown time  . carvedilol (COREG) 6.25 MG tablet Take 1 tablet (6.25 mg total) by mouth 2 (two) times daily with a meal. 540 tablet 1 10/10/2015 at 2000  . dabigatran (PRADAXA) 150 MG CAPS capsule Take 1 capsule (150 mg total) by mouth 2 (two) times daily. 180 capsule 1 10/10/2015 at 2000  . DULoxetine (CYMBALTA) 20 MG capsule Take 2  capsules (40 mg total) by mouth daily. 180 capsule 0 10/10/2015 at Unknown time  . finasteride (PROSCAR) 5 MG tablet Take 1 tablet (5 mg total) by mouth daily. 90 tablet 1 10/10/2015 at Unknown time  . fluticasone (FLONASE) 50 MCG/ACT nasal spray SHAKE LIQUID AND USE 2 SPRAYS IN EACH NOSTRIL DAILY AS NEEDED FOR ALLERGIES OR RHINITIS 16 g 1 10/10/2015 at Unknown time  . furosemide (  LASIX) 20 MG tablet Take 20 mg by mouth daily.    10/10/2015 at Unknown time  . HYDROcodone-acetaminophen (NORCO) 7.5-325 MG tablet Take 1 tablet by mouth every 6 (six) hours as needed for moderate pain.   Past Week at Unknown time  . isosorbide mononitrate (IMDUR) 30 MG 24 hr tablet Take 1 tablet (30 mg total) by mouth daily. 90 tablet 1 10/10/2015 at Unknown time  . NITROSTAT 0.4 MG SL tablet Place 0.4 mg under the tongue every 5 (five) minutes as needed. For chest pain   unknown  . omeprazole (PRILOSEC) 40 MG capsule Take 1 capsule (40 mg total) by mouth daily. 30 capsule 5 10/10/2015 at Unknown time  . ranitidine (ZANTAC) 300 MG tablet Take 1 tablet (300 mg total) by mouth at bedtime. 90 tablet 1 10/10/2015 at Unknown time  . simvastatin (ZOCOR) 40 MG tablet Take 1 tablet (40 mg total) by mouth at bedtime. 90 tablet 1 10/10/2015 at Unknown time  . solifenacin (VESICARE) 5 MG tablet Take 5 mg by mouth daily.    10/10/2015 at Unknown time    VITALS: Blood pressure 129/70, pulse 72, temperature 97.7 F (36.5 C), temperature source Oral, resp. rate 20, weight 155 lb 3.3 oz (70.4 kg), SpO2 100 %.  PHYSICAL EXAM: General appearance: alert, appears older than stated age, cachectic and no distress Neck: JVD - 5 cm above sternal notch and no carotid bruit Lungs: diminished breath sounds bilaterally and rales bibasilar Heart: irregularly irregular rhythm Abdomen: soft, non-tender; bowel sounds normal; no masses,  no organomegaly and scaphoid Extremities: extremities normal, atraumatic, no cyanosis or edema and AICD noted Pulses:  2+ and symmetric Skin: Skin color, texture, turgor normal. No rashes or lesions Neurologic: Grossly normal Psych: Pleasant  LABS: Results for orders placed or performed during the hospital encounter of 10/11/15 (from the past 48 hour(s))  CBC with Differential     Status: Abnormal   Collection Time: 10/11/15  9:50 AM  Result Value Ref Range   WBC 10.5 4.0 - 10.5 K/uL   RBC 4.51 4.22 - 5.81 MIL/uL   Hemoglobin 12.8 (L) 13.0 - 17.0 g/dL   HCT 79.8 59.2 - 10.5 %   MCV 86.7 78.0 - 100.0 fL   MCH 28.4 26.0 - 34.0 pg   MCHC 32.7 30.0 - 36.0 g/dL   RDW 43.2 (H) 04.5 - 21.7 %   Platelets 225 150 - 400 K/uL   Neutrophils Relative % 80 %   Neutro Abs 8.4 (H) 1.7 - 7.7 K/uL   Lymphocytes Relative 13 %   Lymphs Abs 1.3 0.7 - 4.0 K/uL   Monocytes Relative 6 %   Monocytes Absolute 0.7 0.1 - 1.0 K/uL   Eosinophils Relative 1 %   Eosinophils Absolute 0.1 0.0 - 0.7 K/uL   Basophils Relative 0 %   Basophils Absolute 0.0 0.0 - 0.1 K/uL  Comprehensive metabolic panel     Status: Abnormal   Collection Time: 10/11/15  9:50 AM  Result Value Ref Range   Sodium 142 135 - 145 mmol/L   Potassium 4.2 3.5 - 5.1 mmol/L   Chloride 110 101 - 111 mmol/L   CO2 22 22 - 32 mmol/L   Glucose, Bld 132 (H) 65 - 99 mg/dL   BUN 20 6 - 20 mg/dL   Creatinine, Ser 8.02 (H) 0.61 - 1.24 mg/dL   Calcium 9.2 8.9 - 91.1 mg/dL   Total Protein 8.0 6.5 - 8.1 g/dL   Albumin 4.2 3.5 - 5.0  g/dL   AST 17 15 - 41 U/L   ALT 10 (L) 17 - 63 U/L   Alkaline Phosphatase 81 38 - 126 U/L   Total Bilirubin 2.1 (H) 0.3 - 1.2 mg/dL   GFR calc non Af Amer 38 (L) >60 mL/min   GFR calc Af Amer 44 (L) >60 mL/min    Comment: (NOTE) The eGFR has been calculated using the CKD EPI equation. This calculation has not been validated in all clinical situations. eGFR's persistently <60 mL/min signify possible Chronic Kidney Disease.    Anion gap 10 5 - 15  Brain natriuretic peptide     Status: Abnormal   Collection Time: 10/11/15  9:50 AM    Result Value Ref Range   B Natriuretic Peptide 2667.6 (H) 0.0 - 100.0 pg/mL  Magnesium     Status: None   Collection Time: 10/11/15  9:50 AM  Result Value Ref Range   Magnesium 2.1 1.7 - 2.4 mg/dL  Troponin I     Status: None   Collection Time: 10/11/15  9:50 AM  Result Value Ref Range   Troponin I <0.03 <0.031 ng/mL    Comment:        NO INDICATION OF MYOCARDIAL INJURY.   Basic metabolic panel     Status: Abnormal   Collection Time: 10/12/15  5:05 AM  Result Value Ref Range   Sodium 144 135 - 145 mmol/L   Potassium 3.5 3.5 - 5.1 mmol/L   Chloride 105 101 - 111 mmol/L   CO2 27 22 - 32 mmol/L   Glucose, Bld 112 (H) 65 - 99 mg/dL   BUN 23 (H) 6 - 20 mg/dL   Creatinine, Ser 1.80 (H) 0.61 - 1.24 mg/dL   Calcium 9.0 8.9 - 10.3 mg/dL   GFR calc non Af Amer 33 (L) >60 mL/min   GFR calc Af Amer 39 (L) >60 mL/min    Comment: (NOTE) The eGFR has been calculated using the CKD EPI equation. This calculation has not been validated in all clinical situations. eGFR's persistently <60 mL/min signify possible Chronic Kidney Disease.    Anion gap 12 5 - 15    IMAGING: Ct Head Wo Contrast  10/11/2015  CLINICAL DATA:  Intermittent diplopia EXAM: CT HEAD WITHOUT CONTRAST TECHNIQUE: Contiguous axial images were obtained from the base of the skull through the vertex without intravenous contrast. COMPARISON:  02/24/2013 FINDINGS: Global atrophy. Chronic ischemic changes in the periventricular white matter. Lacunar infarcts are present in the bilateral basal ganglia. There is no mass effect, midline shift, or acute hemorrhage. Mastoid air cells are clear. Cranium is intact. IMPRESSION: No acute intracranial pathology. Chronic ischemic changes and atrophy are noted. Electronically Signed   By: Marybelle Killings M.D.   On: 10/11/2015 14:33   Dg Chest Portable 1 View  10/11/2015  CLINICAL DATA:  82 year old male with a history of shortness of breath. EXAM: PORTABLE CHEST 1 VIEW COMPARISON:  01/23/2014  FINDINGS: Cardiomediastinal silhouette likely unchanged, borderline enlarged. Heart borders obscured by overlying lung and pleural disease. Dense opacities at the lung bases obscuring the bilateral hemidiaphragm and the left retrocardiac region. No pneumothorax. Interlobular septal thickening. Pacing device/AICD on the left chest wall with 2 leads in place, unchanged. No displaced fracture. IMPRESSION: Evidence of congestive heart failure with bilateral pleural effusions and/or atelectasis. Signed, Dulcy Fanny. Earleen Newport, DO [facility] Radiology Electronically Signed   By: Corrie Mckusick D.O.   On: 10/11/2015 10:45    HOSPITAL DIAGNOSES:  Principal Problem:   Atrial fibrillation (HCC) Active Problems:   Automatic implantable cardioverter-defibrillator in situ   Cardiomyopathy ([place])   Acute on chronic systolic (congestive) heart failure (HCC)   Diplopia   IMPRESSION: 1. Acute systolic congestive heart failure 2. Regular tachycardia - probably atrial flutter -history of chronic a-fib on device interrogation 3. [facility]ICD at [facility] 4. H/o of A-fib on Pradaxa (CHADSVASC score of 7)  RECOMMENDATION: 1. Acute systolic congestive heart failure with worsening cardiomyopathy and EF 15-20%. EKG on admission with a regular tachycardia at 149 which is likely atrial flutter - there is anterolateral ST depression concerning for rate-related ischemia. There is new anterior wall akinesis, suggestive of LAD infarct, although initial troponin is negative. Known CAD with occluded RCA and left to right collaterals -with moderate LAD disease at last cath. He may have occluded his LAD. This could also be rate-related LV dysfunction given what appears to be an atrial flutter. Would continue to trend troponins. Hold pradaxa for planned invasive work-up and start IV Heparin. He will need further cardiac work-up at [facility], possible L/RHC if renal function will allow and repeat device  interrogation/EP evaluation.  D/W Dr. Ree Kida - I will be happy to take on our service at [facility] in transfer. Can go to telemetry CHF floor 3E.   Time Spent Directly with Patient: 45 minutes  Pixie Casino, MD, [facility] Attending Cardiologist [place] 10/12/2015, 9:22 AM

## 2015-10-12 NOTE — Progress Notes (Addendum)
ANTICOAGULATION CONSULT NOTE - Initial Consult  Pharmacy Consult for Heparin Indication: atrial fibrillation  Allergies  Allergen Reactions  . Bactrim [Sulfamethoxazole-Trimethoprim] Other (See Comments)    Throat swelling  . Penicillins Itching, Swelling and Rash    Patient Measurements: Weight: 155 lb 3.3 oz (70.4 kg) Heparin Dosing Weight: 70.4 kg  Vital Signs: Temp: 97.7 F (36.5 C) (04/30 0514) Temp Source: Oral (04/30 0514) BP: 133/78 mmHg (04/30 0959) Pulse Rate: 107 (04/30 0959)  Labs:  Recent Labs  10/11/15 0950 10/12/15 0505  HGB 12.8*  --   HCT 39.1  --   PLT 225  --   CREATININE 1.61* 1.80*  TROPONINI <0.03  --     Estimated Creatinine Clearance: 30.6 mL/min (by C-G formula based on Cr of 1.8).   Medical History: Past Medical History  Diagnosis Date  . Chronic systolic heart failure (HCC)     EF 25-30% in past;  Echocardiogram 11/08/11: Normal LV wall thickness, EF 45-50%.  Marland Kitchen HTN (hypertension)   . History of diverticulitis of colon   . GERD (gastroesophageal reflux disease)   . History of nephrolithiasis   . Ischemic cardiomyopathy     s/p ICD 2008 (see CHF for EF)  . Urinary retention     foley indwelling 12/2010-04/2011  . Arthritis   . HLD (hyperlipidemia)   . Coronary artery disease     a. Chronically occ RCA. b. Last cardiac catheterization 10/2010: Mid LAD 20-30%, distal LAD 40%, mid D1 40%, mid circumflex 20-30%, distal RCA occluded with left to right collaterals.  . Benign prostatic hypertrophy   . TIA (transient ischemic attack) 10/2011    carotids neg for ICA stenosis  . Atrial fibrillation ([place])   . Skin cancer   . BPH (benign prostatic hyperplasia)   . Renal insufficiency     Renal failure in July 2012 secondary to hypotension, ARB use, and   . Esophageal stricture   . Hemorrhoids     Medications:  Scheduled:  . antiseptic oral rinse  7 mL Mouth Rinse BID  . DULoxetine  40 mg Oral Daily  . feeding supplement (ENSURE ENLIVE)   237 mL Oral BID BM  . finasteride  5 mg Oral Daily  . fluticasone  1 spray Each Nare Daily  . [START ON 10/13/2015] furosemide  40 mg Intravenous Daily  . isosorbide mononitrate  30 mg Oral Daily  . metoprolol tartrate  25 mg Oral BID  . pantoprazole  40 mg Oral Daily  . rosuvastatin  10 mg Oral QHS  . sodium chloride flush  3 mL Intravenous Q12H   Infusions:   PRN: sodium chloride, acetaminophen, ondansetron (ZOFRAN) IV, sodium chloride flush, traZODone  Assessment: 84yoM with PMH significant for ischemic cardiomyopathy ([id number]), chronic systolic CHF, HTN, dyslipidemia, h/o VT and A fib s/p AICD implant and on Pradaxa PTA (last dose 4/29 @2155 ) and presented with progressive DOE. He was started on diltiazem drip for  A fib with RVR and is now on metoprolol. CT head on 4/29 is neg for hemorrhage. Pt is to be started on Heparin for Afib while Pradaxa on hold for planned invasive work-up  Goal of Therapy:  Heparin level 0.3-0.7 units/ml Monitor platelets by anticoagulation protocol: Yes   Plan:  Since last Pradaxa dose was 4/29 @2155  and CrCL=31 Will start heparin drip at 950 units/hr @1800  without a bolus Will check 8hr HL  Will check daily HL and CBC  Garnet Sierras 10/12/2015,10:12 AM  Addendum:  Discussed plan  with Cardio PA, Hau, who agreed with the plan.

## 2015-10-13 ENCOUNTER — Telehealth: Payer: Self-pay | Admitting: *Deleted

## 2015-10-13 ENCOUNTER — Encounter (HOSPITAL_COMMUNITY)
Admission: EM | Disposition: A | Discharge status: Home / Self Care | Payer: Self-pay | Source: Home / Self Care | Attending: Internal Medicine

## 2015-10-13 DIAGNOSIS — I4891 Unspecified atrial fibrillation: Secondary | ICD-10-CM

## 2015-10-13 LAB — BASIC METABOLIC PANEL
Anion gap: 11 (ref 5–15)
BUN: 25 mg/dL — ABNORMAL HIGH (ref 6–20)
CALCIUM: 8.7 mg/dL — AB (ref 8.9–10.3)
CO2: 29 mmol/L (ref 22–32)
CREATININE: 1.71 mg/dL — AB (ref 0.61–1.24)
Chloride: 103 mmol/L (ref 101–111)
GFR calc non Af Amer: 36 mL/min — ABNORMAL LOW (ref >60)
GFR, EST AFRICAN AMERICAN: 41 mL/min — AB (ref >60)
Glucose, Bld: 106 mg/dL — ABNORMAL HIGH (ref 65–99)
Potassium: 3.5 mmol/L (ref 3.5–5.1)
SODIUM: 143 mmol/L (ref 135–145)

## 2015-10-13 LAB — CBC
HCT: 37.7 % — ABNORMAL LOW (ref 39.0–52.0)
Hemoglobin: 12.3 g/dL — ABNORMAL LOW (ref 13.0–17.0)
MCH: 28.9 pg (ref 26.0–34.0)
MCHC: 32.6 g/dL (ref 30.0–36.0)
MCV: 88.5 fL (ref 78.0–100.0)
PLATELETS: 171 10*3/uL (ref 150–400)
RBC: 4.26 MIL/uL (ref 4.22–5.81)
RDW: 16.1 % — AB (ref 11.5–15.5)
WBC: 8.3 10*3/uL (ref 4.0–10.5)

## 2015-10-13 LAB — HEPARIN LEVEL (UNFRACTIONATED)
Heparin Unfractionated: 0.27 IU/mL — ABNORMAL LOW (ref 0.30–0.70)
Heparin Unfractionated: 0.38 IU/mL (ref 0.30–0.70)

## 2015-10-13 SURGERY — RIGHT/LEFT HEART CATH AND CORONARY ANGIOGRAPHY

## 2015-10-13 NOTE — Telephone Encounter (Signed)
PLEASE NOTE: All timestamps contained within this report are represented as [place] Standard Time. CONFIDENTIALTY NOTICE: This fax transmission is intended only for the addressee. It contains information that is legally privileged, confidential or otherwise protected from use or disclosure. If you are not the intended recipient, you are strictly prohibited from reviewing, disclosing, copying using or disseminating any of this information or taking any action in reliance on or regarding this information. If you have received this fax in error, please notify us immediately by telephone so that we can arrange for its return to [place]. Phone: [phone], Toll-Free: [phone], Fax: [phone] Page: 1 of 2 Call Id: [id number] [place] Patient Name: Jacob [name] Gender: Male DOB: [birth year] Age: 82 Y 27 D Return Phone Number: Address: City/State/Zip: [place] Client [place] [facility] - Client Client Site [facility] Physician Crissie Sickles - MD Contact Type Call Who Is [place] Call Type Pharmacy Send to RN Chief Complaint Paging or Request for Consult Reason for Call Request to speak to Physician Initial Comment Caller is [place] from [place] and ER- [phone] - Needs to speak to on call about a medication. Additional Comment Pharmacy Name [facility] Pharmacist Name [facility] Number [phone] Translation No Nurse Assessment Nurse: Malva Cogan, RN, Juliann Pulse Date/Time Eilene Ghazi Time): 10/11/2015 12:07:32 PM Please select the assessment type ---Pharmacy clarification Additional Documentation ---Caller states that he is a pharmacist with [facility] states that when he reviewed pt's home meds that pt had stated he was no longer taking Pradaxa & caller is wanting to  speak with on call provider to clarify if pt is supposed to be taking med or why he was stopped from taking. Advised that on call provider will be paged. Is there an on-call physician for the client? ---Yes Do the client directives allow paging the on call for medication concerns? ---Yes Document information that requires clarification. ---Pradaxa, is pt supposed to be taking or not Guidelines Guideline Title Affirmed Question Affirmed Notes Nurse Date/Time ([place] Time) Disp. Time Eilene Ghazi Time) Disposition Final User 10/11/2015 12:11:09 PM Paged On Call back to [facility] [place] 10/11/2015 12:31:33 PM Call Completed Carola Rhine 10/11/2015 12:07:22 PM Clinical Call Yes Malva Cogan, RN, Juliann Pulse PLEASE NOTE: All timestamps contained within this report are represented as [place] Standard Time. CONFIDENTIALTY NOTICE: This fax transmission is intended only for the addressee. It contains information that is legally privileged, confidential or otherwise protected from use or disclosure. If you are not the intended recipient, you are strictly prohibited from reviewing, disclosing, copying using or disseminating any of this information or taking any action in reliance on or regarding this information. If you have received this fax in error, please notify us immediately by telephone so that we can arrange for its return to [place]. Phone: [phone], Toll-Free: [phone], Fax: [phone] Page: 2 of 2 Call Id: [id number] Paging DoctorName Phone DateTime Result/Outcome Message Type Notes Crissie Sickles - MD [id number] 10/11/2015 12:11:09 PM Paged On Call Back to Call Center Doctor Paged Plz call [facility] @ Call Center [phone]. Crissie Sickles - MD 10/11/2015 12:31:26 PM Spoke with On Call - General Message Result Janace Hoard, nurse for Dr. Anitra Lauth called back for Dr. Anitra Lauth & states that Dr. Anitra Lauth is seeing pts in Saturday clinic, Gabby advised of situation & that caller is  needing to find out if pt  is still supposed to be taking Pradaxa or not. Provided Gabby with caller's provided contact number as well as number provided by pharmacy for caller [phone]).

## 2015-10-13 NOTE — Progress Notes (Addendum)
ANTICOAGULATION CONSULT NOTE - Follow Up Consult  Pharmacy Consult for Heparin (Pradaxa on hold) Indication: atrial fibrillation  Allergies  Allergen Reactions  . Bactrim [Sulfamethoxazole-Trimethoprim] Other (See Comments)    Throat swelling  . Penicillins Itching, Swelling and Rash    Patient Measurements: Height: 5' 7.5" (171.5 cm) Weight: 153 lb 11.2 oz (69.718 kg) (c scale) IBW/kg (Calculated) : 67.25  Vital Signs: Temp: 98 F (36.7 C) (05/01 0722) Temp Source: Oral (05/01 0722) BP: 127/70 mmHg (05/01 0900) Pulse Rate: 73 (05/01 0900)  Labs:  Recent Labs  10/11/15 0950 10/12/15 0505 10/12/15 1015 10/12/15 1640 10/12/15 1642 10/13/15 0215 10/13/15 1025  HGB 12.8*  --   --   --   --  12.3*  --   HCT 39.1  --   --   --   --  37.7*  --   PLT 225  --   --   --   --  171  --   APTT  --   --   --   --  54*  --   --   LABPROT  --   --   --   --  18.8*  --   --   INR  --   --   --   --  1.57*  --   --   HEPARINUNFRC  --   --   --   --   --  0.27* 0.38  CREATININE 1.61* 1.80*  --   --   --   --   --   TROPONINI <0.03  --  0.05* 0.05*  --   --   --     Estimated Creatinine Clearance: 30.1 mL/min (by C-G formula based on Cr of 1.8).   Assessment: Heparin while Pradaxa on hold for possible invasive procedure.  Heparin level now therapeutic  Goal of Therapy:  Heparin level 0.3-0.7 units/ml Monitor platelets by anticoagulation protocol: Yes   Plan:  Continue heparin at 1050 units / hr Follow up am labs  Thank you Anette Guarneri, PharmD [phone] 10/13/2015,11:45 AM

## 2015-10-13 NOTE — Progress Notes (Addendum)
Patient Name: Jacob [name] Date of Encounter: 10/13/2015     Principal Problem:   Atrial fibrillation [facility]) Active Problems:   Automatic implantable cardioverter-defibrillator in situ   Cardiomyopathy ([place])   Acute on chronic systolic (congestive) heart failure (HCC)   Diplopia    SUBJECTIVE  Breathing back to baseline. No CP  CURRENT MEDS . antiseptic oral rinse  7 mL Mouth Rinse BID  . DULoxetine  40 mg Oral Daily  . feeding supplement (ENSURE ENLIVE)  237 mL Oral BID BM  . finasteride  5 mg Oral Daily  . fluticasone  1 spray Each Nare Daily  . furosemide  40 mg Intravenous Daily  . isosorbide mononitrate  30 mg Oral Daily  . metoprolol tartrate  25 mg Oral BID  . pantoprazole  40 mg Oral Daily  . rosuvastatin  10 mg Oral QHS  . sodium chloride flush  3 mL Intravenous Q12H    OBJECTIVE  Filed Vitals:   10/13/15 0119 10/13/15 0722 10/13/15 0900 10/13/15 1248  BP: 120/70 115/61 127/70 108/60  Pulse: 69 71 73 73  Temp: 97.6 F (36.4 C) 98 F (36.7 C)  97.4 F (36.3 C)  TempSrc: Oral Oral  Oral  Resp: 19 18  18   Height:      Weight:  153 lb 11.2 oz (69.718 kg)    SpO2: 98% 96%  98%    Intake/Output Summary (Last 24 hours) at 10/13/15 1505 Last data filed at 10/13/15 1313  Gross per 24 hour  Intake    240 ml  Output   1125 ml  Net   -885 ml   Filed Weights   10/12/15 0514 10/12/15 2034 10/13/15 0722  Weight: 155 lb 3.3 oz (70.4 kg) 153 lb 3.2 oz (69.491 kg) 153 lb 11.2 oz (69.718 kg)    PHYSICAL EXAM  General: Pleasant, NAD. Neuro: Alert and oriented X 3. Moves all extremities spontaneously. Psych: Normal affect. HEENT:  Normal  Neck: Supple without bruits or JVD. Lungs:  Resp regular and unlabored, CTA. Heart: irreg irreg. no s3, s4, or murmurs. Abdomen: Soft, non-tender, non-distended, BS + x 4.  Extremities: No clubbing, cyanosis or edema. DP/PT/Radials 2+ and equal bilaterally.  Accessory Clinical Findings  CBC  Recent Labs   10/11/15 0950 10/13/15 0215  WBC 10.5 8.3  NEUTROABS 8.4*  --   HGB 12.8* 12.3*  HCT 39.1 37.7*  MCV 86.7 88.5  PLT 225 [id number]   Basic Metabolic Panel  Recent Labs  10/11/15 0950 10/12/15 0505  NA 142 144  K 4.2 3.5  CL 110 105  CO2 22 27  GLUCOSE 132* 112*  BUN 20 23*  CREATININE 1.61* 1.80*  CALCIUM 9.2 9.0  MG 2.1  --    Liver Function Tests  Recent Labs  10/11/15 0950  AST 17  ALT 10*  ALKPHOS 81  BILITOT 2.1*  PROT 8.0  ALBUMIN 4.2   No results for input(s): LIPASE, AMYLASE in the last 72 hours. Cardiac Enzymes  Recent Labs  10/11/15 0950 10/12/15 1015 10/12/15 1640  TROPONINI <0.03 0.05* 0.05*    TELE  Atrial flutter with PVCs  Radiology/Studies  Ct Head Wo Contrast  10/11/2015  CLINICAL DATA:  Intermittent diplopia EXAM: CT HEAD WITHOUT CONTRAST TECHNIQUE: Contiguous axial images were obtained from the base of the skull through the vertex without intravenous contrast. COMPARISON:  02/24/2013 FINDINGS: Global atrophy. Chronic ischemic changes in the periventricular white matter. Lacunar infarcts are present in the bilateral basal ganglia.  There is no mass effect, midline shift, or acute hemorrhage. Mastoid air cells are clear. Cranium is intact. IMPRESSION: No acute intracranial pathology. Chronic ischemic changes and atrophy are noted. Electronically Signed   By: Marybelle Killings M.D.   On: 10/11/2015 14:33   Dg Chest Portable 1 View  10/11/2015  CLINICAL DATA:  82 year old male with a history of shortness of breath. EXAM: PORTABLE CHEST 1 VIEW COMPARISON:  01/23/2014 FINDINGS: Cardiomediastinal silhouette likely unchanged, borderline enlarged. Heart borders obscured by overlying lung and pleural disease. Dense opacities at the lung bases obscuring the bilateral hemidiaphragm and the left retrocardiac region. No pneumothorax. Interlobular septal thickening. Pacing device/AICD on the left chest wall with 2 leads in place, unchanged. No displaced fracture.  IMPRESSION: Evidence of congestive heart failure with bilateral pleural effusions and/or atelectasis. Signed, Dulcy Fanny. Earleen Newport, DO [facility] Radiology Electronically Signed   By: Corrie Mckusick D.O.   On: 10/11/2015 10:45    ASSESSMENT AND PLAN This is a 82 y.o. male with a past medical history significant for CAD with CTO RCA, PAF on pradaxa, ischemic cardiomyopathy, EF as low as 25-30%, recently up to [id number], chronic systolic CHF, HTN, HLD, CKD and history of VT s/p AICD ( @ [facility]) who presented to [facility] on 10/11/15 for progressive DOE. He was found to be in afib with RVR and acute CHF. ECHO showed EF down to 15-20% with ant WMA and cardiology consulted.   Acute systolic congestive heart failure: Echo 10/11/15 showed EF has significantly reduced to 15-20% with akinesis of the anteroseptal, anterior and apical walls and inferior hypokinesis, there is moderate LAE and mild RAE, a trivial pericardial effusion is noted.  -- Worsening EF with new anterior wall akinesis, suggestive of LAD infarct. Plan for [facility].  -- With [place] sci ICD. Device was interrogated over the weekend. He is at [facility] and 4:1 flutter. Will consult EP to discuss replacement timing.  -- Weight down 2 lbs (155-->153 lbs). Net neg 1.8L. Currently on IV lasix 40mg  daily. Will hold lasix tomorrow and then can be converted to PO lasix.  CAD: Known CAD with occluded RCA and left to right collaterals -with moderate LAD disease at last cath. -- Troponin 0.5--> 0.05. Continue IV heparin -- With worsening LV function and new WMA, plan for coronary angiography tomorrow. ' -- Continue ASA, BB and statin.   Atrial fib/flutter with RVR: holding pradaxa for heart cath. CHADSVASC score of at least 5. Continue IV heparin. Rate well controlled now. Continue Lopressor 25mg  BID.   CKD: creat improving with diuresis. 1.8--> 1.71. Will hold lasix for cath tomorrow.   Hx of VT: no recent VT on device  interrogation   Signed, Eileen Stanford PA-C  Pager [phone]    Patient seen and examined. Agree with assessment and plan. Long discussion with patient. Mr. Blakeley has a history of ischemic cardiomyopathy with ejection fraction of 25-30% and is status post AICD placement.  In 2013 his ejection fraction had improved to 45-50%.  He has known CAD with occluded RCA and left-to-right collaterals and at last catheterization  in May 2012.  He had mild to moderate LAD and circumflex disease.  He was recently admitted with increasing shortness of breath   and was in atrial fibrillation, rapid ventricular response.   An echo Doppler study of 10/11/2015 showed an EF of 15-20%.   and there is now evidence for akinesis of the anteroseptal anterior apical wall with inferior hypokinesis. His device  was also recently interrogated and he is at [facility].  With his markedly reduced LV function and new anterior wall motion abnormality suggestive of LAD territory normality definitive cardiac.tk catheterization was recommended and discussed with the patient in detail.  His Pradaxa has been held with his last dose being on 10/11/2015.  He has renal insufficiency, but creatinine today is improved to 1.71.  Lasix is on hold and the patient has been on gentle hydration.  Plans will be for right left heart cardiac catheterization tomorrow. The risks and benefits of a cardiac catheterization including, but not limited to, death, stroke, MI, kidney damage and bleeding were discussed with the patient who indicates understanding and agrees to proceed.                                                                                                           Troy Sine, MD, [facility] 10/13/2015 5:40 PM

## 2015-10-13 NOTE — Telephone Encounter (Signed)
Spoke with [facility]. They are able to see the note entered by Dr Quay Burow and will note that Dr Quay Burow deferred to Cardiology. Pt has since been transferred to [facility]

## 2015-10-13 NOTE — Progress Notes (Signed)
Initial Nutrition Assessment  DOCUMENTATION CODES:   Not applicable  INTERVENTION:   Continue Ensure Enlive po BID, each supplement provides 350 kcal and 20 grams of protein  NUTRITION DIAGNOSIS:   Increased nutrient needs related to chronic illness as evidenced by estimated needs  GOAL:   Patient will meet greater than or equal to 90% of their needs  MONITOR:   PO intake, Supplement acceptance, Labs, Weight trends, I & O's  REASON FOR ASSESSMENT:   Malnutrition Screening Tool  ASSESSMENT:   82 y.o. Male with a PMH significant for ischemic cardiomyopathy, EF as low as 25-30%, recently up to Q000111Q, chronic systolic congestive heart failure, HTN, dyslipidemia, history of VT and atrial fibrillation s/p AICD inplantation followed by Dr. Cristopher Peru. Recently seen in the office and his AICD was noted to be at [facility] - Dr. Lovena Le wished to re-assess LVEF to determine if AICD generator change was necessary. He now presents with progressive DOE. He was found to be in a-fib with RVR, started on cardizem. BNP is elevated at 2667. CHR consistent with CHF. He has been on pradaxa for anticoagulation. He denies any chest pain with this event. Initial troponin was negative. Diuresed overnight and creatinine has risen to 1.8 (from 1.6) - only 450 cc recorded urine output. Echo performed yesterday shows EF has significantly reduced to 15-20% with akinesis of the anteroseptal, anterior and apical walls and inferior hypokinesis, there is moderate LAE and mild RAE, a trivial pericardial effusion is noted.  RD unable to obtain much nutrition hx from patient; he is mumbling. He was able to report "they're not giving me anything". Prior to NPO status, pt was on a Heart Healthy diet (1 meal intake recorded at 100%). He is receiving Ensure Enlive supplements BID. RD unable to complete Nutrition Focused Physical Exam at this time.  Diet Order:  Diet NPO time specified  Skin:  Reviewed, no issues  Last  BM:  4/27  Height:   Ht Readings from Last 1 Encounters:  10/12/15 5' 7.5" (1.715 m)    Weight:   Wt Readings from Last 1 Encounters:  10/13/15 153 lb 11.2 oz (69.718 kg)    Ideal Body Weight:  67.2 kg  BMI:  Body mass index is 23.7 kg/(m^2).  Estimated Nutritional Needs:   Kcal:  1600-1800  Protein:  80-90 gm  Fluid:  1.6-1.8 L  EDUCATION NEEDS:   No education needs identified at this time  Arthur Holms, RD, LDN Pager #: [phone] After-Hours Pager #: [phone]

## 2015-10-13 NOTE — Progress Notes (Signed)
ANTICOAGULATION CONSULT NOTE - Follow Up Consult  Pharmacy Consult for Heparin (Pradaxa on hold) Indication: atrial fibrillation  Allergies  Allergen Reactions  . Bactrim [Sulfamethoxazole-Trimethoprim] Other (See Comments)    Throat swelling  . Penicillins Itching, Swelling and Rash    Patient Measurements: Height: 5' 7.5" (171.5 cm) Weight: 153 lb 3.2 oz (69.491 kg) (c scale) IBW/kg (Calculated) : 67.25  Vital Signs: Temp: 97.6 F (36.4 C) (05/01 0119) Temp Source: Oral (05/01 0119) BP: 120/70 mmHg (05/01 0119) Pulse Rate: 69 (05/01 0119)  Labs:  Recent Labs  10/11/15 0950 10/12/15 0505 10/12/15 1015 10/12/15 1640 10/12/15 1642 10/13/15 0215  HGB 12.8*  --   --   --   --  12.3*  HCT 39.1  --   --   --   --  37.7*  PLT 225  --   --   --   --  171  APTT  --   --   --   --  54*  --   LABPROT  --   --   --   --  18.8*  --   INR  --   --   --   --  1.57*  --   HEPARINUNFRC  --   --   --   --   --  0.27*  CREATININE 1.61* 1.80*  --   --   --   --   TROPONINI <0.03  --  0.05* 0.05*  --   --     Estimated Creatinine Clearance: 30.1 mL/min (by C-G formula based on Cr of 1.8).   Assessment: Heparin while Pradaxa on hold for possible invasive procedure. Initial heparin level is low at 0.27. No issues per RN.   Goal of Therapy:  Heparin level 0.3-0.7 units/ml Monitor platelets by anticoagulation protocol: Yes   Plan:  -Inc heparin to 1050 units/hr -1100 HL  Shanoah Asbill 10/13/2015,2:47 AM

## 2015-10-13 NOTE — Telephone Encounter (Signed)
The discharge summary has not been completed and I do not know if he should be taking the medication.  Cardiology did have the medication held while in the hospital - we may need to ask cardiology if they want him to remain off of it.

## 2015-10-14 ENCOUNTER — Encounter (HOSPITAL_COMMUNITY)
Admission: EM | Disposition: A | Discharge status: Home / Self Care | Payer: Self-pay | Source: Home / Self Care | Attending: Internal Medicine

## 2015-10-14 DIAGNOSIS — I25118 Atherosclerotic heart disease of native coronary artery with other forms of angina pectoris: Secondary | ICD-10-CM

## 2015-10-14 DIAGNOSIS — I255 Ischemic cardiomyopathy: Secondary | ICD-10-CM | POA: Insufficient documentation

## 2015-10-14 DIAGNOSIS — I4891 Unspecified atrial fibrillation: Secondary | ICD-10-CM

## 2015-10-14 DIAGNOSIS — I5043 Acute on chronic combined systolic (congestive) and diastolic (congestive) heart failure: Secondary | ICD-10-CM

## 2015-10-14 DIAGNOSIS — I2510 Atherosclerotic heart disease of native coronary artery without angina pectoris: Secondary | ICD-10-CM

## 2015-10-14 HISTORY — PX: CARDIAC CATHETERIZATION: SHX172

## 2015-10-14 LAB — POCT I-STAT 3, VENOUS BLOOD GAS (G3P V)
ACID-BASE EXCESS: 4 mmol/L — AB (ref 0.0–2.0)
BICARBONATE: 28.7 meq/L — AB (ref 20.0–24.0)
O2 Saturation: 51 %
PH VEN: 7.42 — AB (ref 7.250–7.300)
TCO2: 30 mmol/L (ref 0–100)
pCO2, Ven: 44.3 mmHg — ABNORMAL LOW (ref 45.0–50.0)
pO2, Ven: 27 mmHg — ABNORMAL LOW (ref 31.0–45.0)

## 2015-10-14 LAB — BASIC METABOLIC PANEL
ANION GAP: 11 (ref 5–15)
BUN: 28 mg/dL — ABNORMAL HIGH (ref 6–20)
CALCIUM: 8.5 mg/dL — AB (ref 8.9–10.3)
CHLORIDE: 104 mmol/L (ref 101–111)
CO2: 25 mmol/L (ref 22–32)
Creatinine, Ser: 1.65 mg/dL — ABNORMAL HIGH (ref 0.61–1.24)
GFR calc non Af Amer: 37 mL/min — ABNORMAL LOW (ref >60)
GFR, EST AFRICAN AMERICAN: 43 mL/min — AB (ref >60)
GLUCOSE: 90 mg/dL (ref 65–99)
POTASSIUM: 3.5 mmol/L (ref 3.5–5.1)
Sodium: 140 mmol/L (ref 135–145)

## 2015-10-14 LAB — POCT I-STAT 3, ART BLOOD GAS (G3+)
Acid-Base Excess: 3 mmol/L — ABNORMAL HIGH (ref 0.0–2.0)
Bicarbonate: 28.3 mEq/L — ABNORMAL HIGH (ref 20.0–24.0)
O2 SAT: 89 %
PCO2 ART: 46.6 mmHg — AB (ref 35.0–45.0)
TCO2: 30 mmol/L (ref 0–100)
pH, Arterial: 7.391 (ref 7.350–7.450)
pO2, Arterial: 57 mmHg — ABNORMAL LOW (ref 80.0–100.0)

## 2015-10-14 LAB — CBC
HEMATOCRIT: 36.9 % — AB (ref 39.0–52.0)
HEMOGLOBIN: 12 g/dL — AB (ref 13.0–17.0)
MCH: 28.4 pg (ref 26.0–34.0)
MCHC: 32.5 g/dL (ref 30.0–36.0)
MCV: 87.2 fL (ref 78.0–100.0)
Platelets: 144 10*3/uL — ABNORMAL LOW (ref 150–400)
RBC: 4.23 MIL/uL (ref 4.22–5.81)
RDW: 16.2 % — ABNORMAL HIGH (ref 11.5–15.5)
WBC: 7.9 10*3/uL (ref 4.0–10.5)

## 2015-10-14 LAB — HEPARIN LEVEL (UNFRACTIONATED): HEPARIN UNFRACTIONATED: 0.27 [IU]/mL — AB (ref 0.30–0.70)

## 2015-10-14 LAB — POCT ACTIVATED CLOTTING TIME: ACTIVATED CLOTTING TIME: 203 s

## 2015-10-14 SURGERY — RIGHT/LEFT HEART CATH AND CORONARY ANGIOGRAPHY

## 2015-10-14 MED ORDER — HEPARIN SODIUM (PORCINE) 1000 UNIT/ML IJ SOLN
INTRAMUSCULAR | Status: DC | PRN
  Administered 2015-10-14: 3500 [IU] via INTRAVENOUS

## 2015-10-14 MED ORDER — FUROSEMIDE 40 MG PO TABS
40.0000 mg | ORAL_TABLET | Freq: Every day | ORAL | Status: DC
Start: 2015-10-14 — End: 2015-10-17
  Administered 2015-10-14 – 2015-10-17 (×4): 40 mg via ORAL
  Filled 2015-10-14 (×4): qty 1

## 2015-10-14 MED ORDER — SODIUM CHLORIDE 0.9 % IV SOLN
INTRAVENOUS | Status: DC
  Administered 2015-10-14: 15:00:00 via INTRAVENOUS

## 2015-10-14 MED ORDER — HEPARIN SODIUM (PORCINE) 1000 UNIT/ML IJ SOLN
INTRAMUSCULAR | Status: AC
  Filled 2015-10-14: qty 1

## 2015-10-14 MED ORDER — SODIUM CHLORIDE 0.9% FLUSH: 3.0000 mL | Freq: Two times a day (BID) | INTRAVENOUS | Status: DC

## 2015-10-14 MED ORDER — RIVAROXABAN 20 MG PO TABS: 20.0000 mg | ORAL_TABLET | Freq: Every day | ORAL | Status: DC

## 2015-10-14 MED ORDER — SODIUM CHLORIDE 0.9 % IV SOLN: 250.0000 mL | INTRAVENOUS | Status: DC | PRN

## 2015-10-14 MED ORDER — IOPAMIDOL (ISOVUE-370) INJECTION 76%
INTRAVENOUS | Status: AC
  Filled 2015-10-14: qty 50

## 2015-10-14 MED ORDER — ASPIRIN 81 MG PO CHEW
81.0000 mg | CHEWABLE_TABLET | ORAL | Status: AC
  Administered 2015-10-14: 81 mg via ORAL
  Filled 2015-10-14: qty 1

## 2015-10-14 MED ORDER — SODIUM CHLORIDE 0.9 % IV SOLN
INTRAVENOUS | Status: AC
  Administered 2015-10-14: 17:00:00 via INTRAVENOUS

## 2015-10-14 MED ORDER — SODIUM CHLORIDE 0.9% FLUSH: 3.0000 mL | INTRAVENOUS | Status: DC | PRN

## 2015-10-14 MED ORDER — HEPARIN (PORCINE) IN NACL 2-0.9 UNIT/ML-% IJ SOLN
INTRAMUSCULAR | Status: AC
  Filled 2015-10-14: qty 1000

## 2015-10-14 MED ORDER — VERAPAMIL HCL 2.5 MG/ML IV SOLN
INTRAVENOUS | Status: DC | PRN
  Administered 2015-10-14: 16:00:00 via INTRA_ARTERIAL

## 2015-10-14 MED ORDER — HEPARIN (PORCINE) IN NACL 2-0.9 UNIT/ML-% IJ SOLN
INTRAMUSCULAR | Status: DC | PRN
  Administered 2015-10-14: 1000 mL

## 2015-10-14 MED ORDER — DABIGATRAN ETEXILATE MESYLATE 150 MG PO CAPS
150.0000 mg | ORAL_CAPSULE | Freq: Two times a day (BID) | ORAL | Status: DC
  Administered 2015-10-14 – 2015-10-17 (×6): 150 mg via ORAL
  Filled 2015-10-14 (×6): qty 1

## 2015-10-14 MED ORDER — LIDOCAINE HCL (PF) 1 % IJ SOLN
INTRAMUSCULAR | Status: DC | PRN
  Administered 2015-10-14: 8 mL via SUBCUTANEOUS

## 2015-10-14 MED ORDER — LIDOCAINE HCL (PF) 1 % IJ SOLN
INTRAMUSCULAR | Status: AC
  Filled 2015-10-14: qty 30

## 2015-10-14 MED ORDER — SODIUM CHLORIDE 0.9% FLUSH
3.0000 mL | Freq: Two times a day (BID) | INTRAVENOUS | Status: DC
  Administered 2015-10-15 – 2015-10-16 (×4): 3 mL via INTRAVENOUS

## 2015-10-14 MED ORDER — IOPAMIDOL (ISOVUE-370) INJECTION 76%
INTRAVENOUS | Status: DC | PRN
  Administered 2015-10-14: 55 mL via INTRA_ARTERIAL

## 2015-10-14 MED ORDER — VERAPAMIL HCL 2.5 MG/ML IV SOLN
INTRAVENOUS | Status: AC
  Filled 2015-10-14: qty 2

## 2015-10-14 SURGICAL SUPPLY — 13 items
CATH BALLN WEDGE 5F 110CM (CATHETERS) ×2 IMPLANT
CATH OPTITORQUE TIG 4.0 5F (CATHETERS) ×2 IMPLANT
DEVICE RAD COMP TR BAND LRG (VASCULAR PRODUCTS) ×2 IMPLANT
GLIDESHEATH SLEND A-KIT 6F 22G (SHEATH) ×2 IMPLANT
KIT HEART LEFT (KITS) ×3 IMPLANT
PACK CARDIAC CATHETERIZATION (CUSTOM PROCEDURE TRAY) ×3 IMPLANT
SHEATH FAST CATH BRACH 5F 5CM (SHEATH) ×2 IMPLANT
TRANSDUCER W/STOPCOCK (MISCELLANEOUS) ×4 IMPLANT
TUBING ART PRESS 72  MALE/FEM (TUBING) ×2
TUBING ART PRESS 72 MALE/FEM (TUBING) IMPLANT
TUBING CIL FLEX 10 FLL-RA (TUBING) ×3 IMPLANT
WIRE EMERALD 3MM-J .025X260CM (WIRE) ×2 IMPLANT
WIRE SAFE-T 1.5MM-J .035X260CM (WIRE) ×2 IMPLANT

## 2015-10-14 NOTE — Care Management Important Message (Signed)
Important Message  Patient Details  Name: Jacob [name] MRN: [id number] Date of Birth: [birth year]   Medicare Important Message Given:  Yes    Jaanai Salemi P Broughton Eppinger 10/14/2015, 12:52 PM

## 2015-10-14 NOTE — Progress Notes (Signed)
ANTICOAGULATION CONSULT NOTE - Follow Up Consult  Pharmacy Consult for Heparin (Pradaxa on hold) Indication: atrial fibrillation  Allergies  Allergen Reactions  . Bactrim [Sulfamethoxazole-Trimethoprim] Other (See Comments)    Throat swelling  . Penicillins Itching, Swelling and Rash    Patient Measurements: Height: 5' 7.5" (171.5 cm) Weight: 153 lb 8 oz (69.627 kg) (c scale) IBW/kg (Calculated) : 67.25  Vital Signs: Temp: 97.4 F (36.3 C) (05/02 0547) Temp Source: Oral (05/02 0547) BP: 122/75 mmHg (05/02 0547) Pulse Rate: 99 (05/02 0547)  Labs:  Recent Labs  10/11/15 0950 10/12/15 0505 10/12/15 1015 10/12/15 1640 10/12/15 1642 10/13/15 0215 10/13/15 1025 10/13/15 1438 10/14/15 0309  HGB 12.8*  --   --   --   --  12.3*  --   --  12.0*  HCT 39.1  --   --   --   --  37.7*  --   --  36.9*  PLT 225  --   --   --   --  171  --   --  144*  APTT  --   --   --   --  54*  --   --   --   --   LABPROT  --   --   --   --  18.8*  --   --   --   --   INR  --   --   --   --  1.57*  --   --   --   --   HEPARINUNFRC  --   --   --   --   --  0.27* 0.38  --  0.27*  CREATININE 1.61* 1.80*  --   --   --   --   --  1.71* 1.65*  TROPONINI <0.03  --  0.05* 0.05*  --   --   --   --   --     Estimated Creatinine Clearance: 32.9 mL/min (by C-G formula based on Cr of 1.65).   Assessment: Heparin while Pradaxa on hold for possible invasive procedure.  Heparin level   Goal of Therapy:  Heparin level 0.3-0.7 units/ml Monitor platelets by anticoagulation protocol: Yes   Plan:  Heparin at 1150 units / hr Follow up after cath  Thank you Anette Guarneri, PharmD [phone] 10/14/2015,9:09 AM

## 2015-10-14 NOTE — Progress Notes (Signed)
TR band removed at 1848. A 2x2 guaze with tegaderm was applied. Site remains level 0. R pulses remain 2+, extremity is warm to touch, normal color for ethnicity. Vital signs stable. Pt instructed to not put any pressure or use arm. Pt instructed to call if arm begins to bleed. Pt verbalized understanding. Will continue to monitor pt.  Clarise Cruz RN

## 2015-10-14 NOTE — Progress Notes (Signed)
Site area: rt ac venous sheath Site Prior to Removal:  Level  0 Pressure Applied For:  10 minutes Manual:   yes Patient Status During Pull:  stable Post Pull Site:  Level  0 Post Pull Instructions Given:  yes Post Pull Pulses Present: yes Dressing Applied:  tegaderm Bedrest begins @ [id number] Comments:

## 2015-10-14 NOTE — Progress Notes (Addendum)
Directly after pt voided, bladder scan performed resulting with 380cc residual.  Grandville Silos, PA, notified.  I/O cath performed.  I/O cath resulted with 250cc out, then pt voided on his own another 125cc out, with residual of 63cc. Heparin stopped, on-call to cath lab.

## 2015-10-14 NOTE — Progress Notes (Signed)
Patient Name: Jacob [name] Date of Encounter: 10/14/2015     Principal Problem:   Atrial fibrillation [facility]) Active Problems:   Automatic implantable cardioverter-defibrillator in situ   Cardiomyopathy ([place])   Acute on chronic systolic (congestive) heart failure (HCC)   Diplopia    SUBJECTIVE  No complaints. Awaiting heart cath.   CURRENT MEDS . antiseptic oral rinse  7 mL Mouth Rinse BID  . DULoxetine  40 mg Oral Daily  . feeding supplement (ENSURE ENLIVE)  237 mL Oral BID BM  . finasteride  5 mg Oral Daily  . fluticasone  1 spray Each Nare Daily  . furosemide  40 mg Intravenous Daily  . isosorbide mononitrate  30 mg Oral Daily  . metoprolol tartrate  25 mg Oral BID  . pantoprazole  40 mg Oral Daily  . rosuvastatin  10 mg Oral QHS  . sodium chloride flush  3 mL Intravenous Q12H  . sodium chloride flush  3 mL Intravenous Q12H    OBJECTIVE  Filed Vitals:   10/13/15 1842 10/13/15 2002 10/14/15 0547 10/14/15 0950  BP: 110/49 112/55 122/75 126/93  Pulse: 108 50 99 86  Temp: 97.7 F (36.5 C) 97.5 F (36.4 C) 97.4 F (36.3 C)   TempSrc: Oral  Oral   Resp: 18 17 16    Height:      Weight:   153 lb 8 oz (69.627 kg)   SpO2: 100% 94% 92%     Intake/Output Summary (Last 24 hours) at 10/14/15 1019 Last data filed at 10/14/15 0809  Gross per 24 hour  Intake    480 ml  Output    700 ml  Net   -220 ml   Filed Weights   10/12/15 2034 10/13/15 0722 10/14/15 0547  Weight: 153 lb 3.2 oz (69.491 kg) 153 lb 11.2 oz (69.718 kg) 153 lb 8 oz (69.627 kg)    PHYSICAL EXAM  General: Pleasant, NAD. Neuro: Alert and oriented X 3. Moves all extremities spontaneously. Psych: Normal affect. HEENT:  Normal  Neck: Supple without bruits or JVD. Lungs:  Resp regular and unlabored, CTA. Heart: irreg irreg. no s3, s4, or murmurs. Abdomen: Soft, non-tender, non-distended, BS + x 4.  Extremities: No clubbing, cyanosis or edema. DP/PT/Radials 2+ and equal bilaterally.  Accessory  Clinical Findings  CBC  Recent Labs  10/13/15 0215 10/14/15 0309  WBC 8.3 7.9  HGB 12.3* 12.0*  HCT 37.7* 36.9*  MCV 88.5 87.2  PLT 171 [id number]*   Basic Metabolic Panel  Recent Labs  10/13/15 1438 10/14/15 0309  NA 143 140  K 3.5 3.5  CL 103 104  CO2 29 25  GLUCOSE 106* 90  BUN 25* 28*  CREATININE 1.71* 1.65*  CALCIUM 8.7* 8.5*   Liver Function Tests No results for input(s): AST, ALT, ALKPHOS, BILITOT, PROT, ALBUMIN in the last 72 hours. No results for input(s): LIPASE, AMYLASE in the last 72 hours. Cardiac Enzymes  Recent Labs  10/12/15 1015 10/12/15 1640  TROPONINI 0.05* 0.05*    TELE  Atrial flutter with PVCs with good rate control   Radiology/Studies  Ct Head Wo Contrast  10/11/2015  CLINICAL DATA:  Intermittent diplopia EXAM: CT HEAD WITHOUT CONTRAST TECHNIQUE: Contiguous axial images were obtained from the base of the skull through the vertex without intravenous contrast. COMPARISON:  02/24/2013 FINDINGS: Global atrophy. Chronic ischemic changes in the periventricular white matter. Lacunar infarcts are present in the bilateral basal ganglia. There is no mass effect, midline shift, or acute hemorrhage.  Mastoid air cells are clear. Cranium is intact. IMPRESSION: No acute intracranial pathology. Chronic ischemic changes and atrophy are noted. Electronically Signed   By: Marybelle Killings M.D.   On: 10/11/2015 14:33   Dg Chest Portable 1 View  10/11/2015  CLINICAL DATA:  82 year old male with a history of shortness of breath. EXAM: PORTABLE CHEST 1 VIEW COMPARISON:  01/23/2014 FINDINGS: Cardiomediastinal silhouette likely unchanged, borderline enlarged. Heart borders obscured by overlying lung and pleural disease. Dense opacities at the lung bases obscuring the bilateral hemidiaphragm and the left retrocardiac region. No pneumothorax. Interlobular septal thickening. Pacing device/AICD on the left chest wall with 2 leads in place, unchanged. No displaced fracture.  IMPRESSION: Evidence of congestive heart failure with bilateral pleural effusions and/or atelectasis. Signed, Dulcy Fanny. Earleen Newport, DO [facility] Rad[facility] Electronically Signed   By: Corrie Mckusick D.O.   On: 10/11/2015 10:45    ASSESSMENT AND PLAN This is a 82 y.o. male with a past medical history significant for CAD with CTO RCA, PAF on pradaxa, ischemic cardiomyopathy, EF as low as 25-30%, recently up to Q000111Q, chronic systolic CHF, HTN, HLD, CKD and history of VT s/p AICD ( @ [facility]) who presented to [name] on 10/11/15 for progressive DOE. He was found to be in afib with RVR and acute CHF. ECHO showed EF down to 15-20% with ant WMA and cardiology consulted.   Acute systolic congestive heart failure: Mr. Bodle has a history of ischemic cardiomyopathy with ejection fraction of 25-30% and is status post AICD placement. In 2013 his ejection fraction had improved to 45-50%. -- Most recent Echo on 10/11/15 showed EF has significantly reduced to 15-20% with akinesis of the anteroseptal, anterior and apical walls and inferior hypokinesis, there is moderate LAE and mild RAE, a trivial pericardial effusion is noted.  -- Worsening EF with new anterior wall akinesis, suggestive of LAD infarct. Plan for [name] today -- With b[place] sci ICD. Device was interrogated over the weekend. He is at [facility] and 4:1 flutter. I discussed case with EP and Dr. Lovena Le requested that we make an outpatient appointment with him after this admission. -- Weight down 2 lbs (155-->153 lbs). Net neg 1.7L. Lasix currently held for cath with gentle hydration  CAD: Known CAD with occluded RCA and left to right collaterals -with moderate LAD disease at last cath. -- Troponin 0.5--> 0.05. Continue IV heparin -- With worsening LV function and new WMA, plan for coronary angiography today  -- Continue ASA, BB and statin.   Atrial fib/flutter with RVR: holding pradaxa for heart cath (last dose on  10/11/2015). CHADSVASC score of at least 5. Continue IV heparin. Rate well controlled now. Continue Lopressor 25mg  BID.   CKD: creat improved with diuresis. 1.8--> 1.71--> 1.65. Lasix currently held and given gentle hydration for cath today.   Hx of VT: no recent VT on device interrogation   Signed, Eileen Stanford PA-C  Pager [phone]     Cath today:  Conclusion    1. Mid RCA lesion, 80% stenosed. Dist RCA lesion, 100% stenosed. -- Stable from 2012 2. Ost LM to LM lesion, 40% stenosed. 3. Ost LAD to Mid LAD lesion, 20% stenosed. Dist LAD lesion, 25% stenosed. 1st Diag lesion, 30% stenosed. 4. Ramus lesion, 20% stenosed. 5. There is severe left ventricular systolic dysfunction. Cardiac Output/Index Moderate-Severely Reduced 6. Normal LVEDP of 5-10 mmHg, PCWP of 10 mmHg   Relatively stable coronary disease when compared to 2012 cardiac catheterization. Relatively normal  right heart catheterization pressures suggesting adequate diuresis  Patient clearly has an EF of maybe 10-15% with moderate-severely reduced cardiac output and index by Fick.  I suspect that his reduced ejection fraction is related to atrial fibrillation/flutter in the setting of existing ischemic cardio myopathy as opposed to worsening CAD.   Plan:  Return to nursing unit for TR band removal  DC IV Lasix and start by mouth Lasix in the morning  Restart Pradaxa tonight at 2200  Consider restoration of sinus rhythm either through chemical or electrical cardioversion       Patient seen and examined. Agree with assessment and plan. Cath earlier today; data reviewed; no significant progression of CAD. Normal R heart pressure.  Plan to add ACE-I/ARB in am if renal stable.    Troy Sine, MD, [name] 10/14/2015 6:04 PM

## 2015-10-14 NOTE — Interval H&P Note (Signed)
History and Physical Interval Note:  10/14/2015 3:18 PM  Mariane Masters  has presented today for surgery, with the diagnosis of acute on chronic combined systolic and diastolic heart failure, cardiomyopathy with known multivessel coronary artery disease. The various methods of treatment have been discussed with the patient and family. After consideration of risks, benefits and other options for treatment, the patient has consented to  Procedure(s): Right/Left Heart Cath and Coronary Angiography (N/A) as a surgical intervention .  The patient's history has been reviewed, patient examined, no change in status, stable for surgery.  I have reviewed the patient's chart and labs.  Questions were answered to the patient's satisfaction.     HARDING, DAVID W

## 2015-10-14 NOTE — Research (Signed)
LEADERS FREE II Research Study Informed Consent   Subject Name: Jacob [name]  Subject met inclusion and exclusion criteria.  The informed consent form, study requirements and expectations were reviewed with the subject and questions and concerns were addressed prior to the signing of the consent form.  The subject verbalized understanding of the trial requirements.  The subject agreed to participate in the LEADERS FREE II  trial and signed the informed consent 1505 on 10/14/2015.  The informed consent was obtained prior to performance of any protocol-specific procedures for the subject.  A copy of the signed informed consent was given to the subject and a copy was placed in the subject's medical record.  Jacob [name] 10/14/2015, 4:42 PM

## 2015-10-14 NOTE — H&P (View-Only) (Signed)
Patient Name: Jacob [name] Date of Encounter: 10/13/2015     Principal Problem:   Atrial fibrillation [facility]) Active Problems:   Automatic implantable cardioverter-defibrillator in situ   Cardiomyopathy ([place])   Acute on chronic systolic (congestive) heart failure (HCC)   Diplopia    SUBJECTIVE  Breathing back to baseline. No CP  CURRENT MEDS . antiseptic oral rinse  7 mL Mouth Rinse BID  . DULoxetine  40 mg Oral Daily  . feeding supplement (ENSURE ENLIVE)  237 mL Oral BID BM  . finasteride  5 mg Oral Daily  . fluticasone  1 spray Each Nare Daily  . furosemide  40 mg Intravenous Daily  . isosorbide mononitrate  30 mg Oral Daily  . metoprolol tartrate  25 mg Oral BID  . pantoprazole  40 mg Oral Daily  . rosuvastatin  10 mg Oral QHS  . sodium chloride flush  3 mL Intravenous Q12H    OBJECTIVE  Filed Vitals:   10/13/15 0119 10/13/15 0722 10/13/15 0900 10/13/15 1248  BP: 120/70 115/61 127/70 108/60  Pulse: 69 71 73 73  Temp: 97.6 F (36.4 C) 98 F (36.7 C)  97.4 F (36.3 C)  TempSrc: Oral Oral  Oral  Resp: 19 18  18   Height:      Weight:  153 lb 11.2 oz (69.718 kg)    SpO2: 98% 96%  98%    Intake/Output Summary (Last 24 hours) at 10/13/15 1505 Last data filed at 10/13/15 1313  Gross per 24 hour  Intake    240 ml  Output   1125 ml  Net   -885 ml   Filed Weights   10/12/15 0514 10/12/15 2034 10/13/15 0722  Weight: 155 lb 3.3 oz (70.4 kg) 153 lb 3.2 oz (69.491 kg) 153 lb 11.2 oz (69.718 kg)    PHYSICAL EXAM  General: Pleasant, NAD. Neuro: Alert and oriented X 3. Moves all extremities spontaneously. Psych: Normal affect. HEENT:  Normal  Neck: Supple without bruits or JVD. Lungs:  Resp regular and unlabored, CTA. Heart: irreg irreg. no s3, s4, or murmurs. Abdomen: Soft, non-tender, non-distended, BS + x 4.  Extremities: No clubbing, cyanosis or edema. DP/PT/Radials 2+ and equal bilaterally.  Accessory Clinical Findings  CBC  Recent Labs   10/11/15 0950 10/13/15 0215  WBC 10.5 8.3  NEUTROABS 8.4*  --   HGB 12.8* 12.3*  HCT 39.1 37.7*  MCV 86.7 88.5  PLT 225 [id number]   Basic Metabolic Panel  Recent Labs  10/11/15 0950 10/12/15 0505  NA 142 144  K 4.2 3.5  CL 110 105  CO2 22 27  GLUCOSE 132* 112*  BUN 20 23*  CREATININE 1.61* 1.80*  CALCIUM 9.2 9.0  MG 2.1  --    Liver Function Tests  Recent Labs  10/11/15 0950  AST 17  ALT 10*  ALKPHOS 81  BILITOT 2.1*  PROT 8.0  ALBUMIN 4.2   No results for input(s): LIPASE, AMYLASE in the last 72 hours. Cardiac Enzymes  Recent Labs  10/11/15 0950 10/12/15 1015 10/12/15 1640  TROPONINI <0.03 0.05* 0.05*    TELE  Atrial flutter with PVCs  Radiology/Studies  Ct Head Wo Contrast  10/11/2015  CLINICAL DATA:  Intermittent diplopia EXAM: CT HEAD WITHOUT CONTRAST TECHNIQUE: Contiguous axial images were obtained from the base of the skull through the vertex without intravenous contrast. COMPARISON:  02/24/2013 FINDINGS: Global atrophy. Chronic ischemic changes in the periventricular white matter. Lacunar infarcts are present in the bilateral basal ganglia.  There is no mass effect, midline shift, or acute hemorrhage. Mastoid air cells are clear. Cranium is intact. IMPRESSION: No acute intracranial pathology. Chronic ischemic changes and atrophy are noted. Electronically Signed   By: Marybelle Killings M.D.   On: 10/11/2015 14:33   Dg Chest Portable 1 View  10/11/2015  CLINICAL DATA:  82 year old male with a history of shortness of breath. EXAM: PORTABLE CHEST 1 VIEW COMPARISON:  01/23/2014 FINDINGS: Cardiomediastinal silhouette likely unchanged, borderline enlarged. Heart borders obscured by overlying lung and pleural disease. Dense opacities at the lung bases obscuring the bilateral hemidiaphragm and the left retrocardiac region. No pneumothorax. Interlobular septal thickening. Pacing device/AICD on the left chest wall with 2 leads in place, unchanged. No displaced fracture.  IMPRESSION: Evidence of congestive heart failure with bilateral pleural effusions and/or atelectasis. Signed, Dulcy Fanny. Earleen Newport, DO [facility] Radiology Electronically Signed   By: Corrie Mckusick D.O.   On: 10/11/2015 10:45    ASSESSMENT AND PLAN This is a 82 y.o. male with a past medical history significant for CAD with CTO RCA, PAF on pradaxa, ischemic cardiomyopathy, EF as low as 25-30%, recently up to Q000111Q, chronic systolic CHF, HTN, HLD, CKD and history of VT s/p AICD ( @ [facility]) who presented to [facility] on 10/11/15 for progressive DOE. He was found to be in afib with RVR and acute CHF. ECHO showed EF down to 15-20% with ant WMA and cardiology consulted.   Acute systolic congestive heart failure: Echo 10/11/15 showed EF has significantly reduced to 15-20% with akinesis of the anteroseptal, anterior and apical walls and inferior hypokinesis, there is moderate LAE and mild RAE, a trivial pericardial effusion is noted.  -- Worsening EF with new anterior wall akinesis, suggestive of LAD infarct. Plan for [facility].  -- With [employer] sci ICD. Device was interrogated over the weekend. He is at [facility] and 4:1 flutter. Will consult EP to discuss replacement timing.  -- Weight down 2 lbs (155-->153 lbs). Net neg 1.8L. Currently on IV lasix 40mg  daily. Will hold lasix tomorrow and then can be converted to PO lasix.  CAD: Known CAD with occluded RCA and left to right collaterals -with moderate LAD disease at last cath. -- Troponin 0.5--> 0.05. Continue IV heparin -- With worsening LV function and new WMA, plan for coronary angiography tomorrow. ' -- Continue ASA, BB and statin.   Atrial fib/flutter with RVR: holding pradaxa for heart cath. CHADSVASC score of at least 5. Continue IV heparin. Rate well controlled now. Continue Lopressor 25mg  BID.   CKD: creat improving with diuresis. 1.8--> 1.71. Will hold lasix for cath tomorrow.   Hx of VT: no recent VT on device  interrogation   Signed, Eileen Stanford PA-C  Pager [phone]    Patient seen and examined. Agree with assessment and plan. Long discussion with patient. Mr. Heimberger has a history of ischemic cardiomyopathy with ejection fraction of 25-30% and is status post AICD placement.  In 2013 his ejection fraction had improved to 45-50%.  He has known CAD with occluded RCA and left-to-right collaterals and at last catheterization  in May 2012.  He had mild to moderate LAD and circumflex disease.  He was recently admitted with increasing shortness of breath   and was in atrial fibrillation, rapid ventricular response.   An echo Doppler study of 10/11/2015 showed an EF of 15-20%.   and there is now evidence for akinesis of the anteroseptal anterior apical wall with inferior hypokinesis. His device  was also recently interrogated and he is at [facility].  With his markedly reduced LV function and new anterior wall motion abnormality suggestive of LAD territory normality definitive cardiac.tk catheterization was recommended and discussed with the patient in detail.  His Pradaxa has been held with his last dose being on 10/11/2015.  He has renal insufficiency, but creatinine today is improved to 1.71.  Lasix is on hold and the patient has been on gentle hydration.  Plans will be for right left heart cardiac catheterization tomorrow. The risks and benefits of a cardiac catheterization including, but not limited to, death, stroke, MI, kidney damage and bleeding were discussed with the patient who indicates understanding and agrees to proceed.                                                                                                           Troy Sine, MD, [facility] 10/13/2015 5:40 PM

## 2015-10-15 ENCOUNTER — Encounter (HOSPITAL_COMMUNITY): Payer: Self-pay | Admitting: Cardiology

## 2015-10-15 ENCOUNTER — Other Ambulatory Visit: Payer: Self-pay

## 2015-10-15 DIAGNOSIS — I5041 Acute combined systolic (congestive) and diastolic (congestive) heart failure: Secondary | ICD-10-CM

## 2015-10-15 DIAGNOSIS — I255 Ischemic cardiomyopathy: Secondary | ICD-10-CM

## 2015-10-15 LAB — CBC
HCT: 36.4 % — ABNORMAL LOW (ref 39.0–52.0)
Hemoglobin: 11.5 g/dL — ABNORMAL LOW (ref 13.0–17.0)
MCH: 27.6 pg (ref 26.0–34.0)
MCHC: 31.6 g/dL (ref 30.0–36.0)
MCV: 87.5 fL (ref 78.0–100.0)
Platelets: 176 10*3/uL (ref 150–400)
RBC: 4.16 MIL/uL — ABNORMAL LOW (ref 4.22–5.81)
RDW: 16.2 % — ABNORMAL HIGH (ref 11.5–15.5)
WBC: 6.5 10*3/uL (ref 4.0–10.5)

## 2015-10-15 LAB — BASIC METABOLIC PANEL
ANION GAP: 11 (ref 5–15)
BUN: 25 mg/dL — AB (ref 6–20)
CHLORIDE: 105 mmol/L (ref 101–111)
CO2: 25 mmol/L (ref 22–32)
Calcium: 8.7 mg/dL — ABNORMAL LOW (ref 8.9–10.3)
Creatinine, Ser: 1.56 mg/dL — ABNORMAL HIGH (ref 0.61–1.24)
GFR calc Af Amer: 46 mL/min — ABNORMAL LOW (ref >60)
GFR calc non Af Amer: 40 mL/min — ABNORMAL LOW (ref >60)
GLUCOSE: 101 mg/dL — AB (ref 65–99)
POTASSIUM: 3.3 mmol/L — AB (ref 3.5–5.1)
Sodium: 141 mmol/L (ref 135–145)

## 2015-10-15 MED ORDER — HYDRALAZINE HCL 25 MG PO TABS
25.0000 mg | ORAL_TABLET | Freq: Three times a day (TID) | ORAL | Status: DC
  Administered 2015-10-16 (×2): 25 mg via ORAL
  Filled 2015-10-15 (×3): qty 1

## 2015-10-15 NOTE — Progress Notes (Signed)
Patient Name: Jacob [name] Date of Encounter: 10/15/2015     Principal Problem:   Atrial fibrillation ([place]) Active Problems:   CORONARY ATHEROSCLEROSIS, NATIVE VESSEL   Acute combined systolic and diastolic heart failure (HCC)   Automatic implantable cardioverter-defibrillator in situ   Cardiomyopathy ([place])   Diplopia   Acute on chronic combined systolic and diastolic congestive heart failure (HCC)   Atrial fibrillation with RVR (HCC)   Cardiomyopathy, ischemic   Coronary artery disease involving native heart    SUBJECTIVE  No complaints. Post cath today.   CURRENT MEDS . antiseptic oral rinse  7 mL Mouth Rinse BID  . dabigatran  150 mg Oral Q12H  . DULoxetine  40 mg Oral Daily  . feeding supplement (ENSURE ENLIVE)  237 mL Oral BID BM  . finasteride  5 mg Oral Daily  . fluticasone  1 spray Each Nare Daily  . furosemide  40 mg Oral Daily  . isosorbide mononitrate  30 mg Oral Daily  . metoprolol tartrate  25 mg Oral BID  . pantoprazole  40 mg Oral Daily  . rosuvastatin  10 mg Oral QHS  . sodium chloride flush  3 mL Intravenous Q12H  . sodium chloride flush  3 mL Intravenous Q12H    OBJECTIVE  Filed Vitals:   10/14/15 1650 10/14/15 2107 10/15/15 0431 10/15/15 0940  BP: 124/63 107/61 132/70 105/59  Pulse: 70 97 72 87  Temp:  98.1 F (36.7 C) 97.5 F (36.4 C)   TempSrc:  Oral Oral   Resp: 14 18 18    Height:      Weight:   151 lb 12.8 oz (68.856 kg)   SpO2: 98% 96% 96%     Intake/Output Summary (Last 24 hours) at 10/15/15 1208 Last data filed at 10/15/15 0942  Gross per 24 hour  Intake 1060.92 ml  Output   1125 ml  Net -64.08 ml   Filed Weights   10/13/15 0722 10/14/15 0547 10/15/15 0431  Weight: 153 lb 11.2 oz (69.718 kg) 153 lb 8 oz (69.627 kg) 151 lb 12.8 oz (68.856 kg)    PHYSICAL EXAM  General: Pleasant, NAD. Neuro: Alert and oriented X 3. Moves all extremities spontaneously. Psych: Normal affect. HEENT:  Normal  Neck: Supple without bruits  or JVD. Lungs:  Resp regular and unlabored, CTA. Heart: irreg irreg. no s3, s4, or murmurs. Abdomen: Soft, non-tender, non-distended, BS + x 4.  Extremities: No clubbing, cyanosis or edema. DP/PT/Radials 2+ and equal bilaterally. Right wrist without bruising, or hematoma.  Accessory Clinical Findings  CBC  Recent Labs  10/14/15 0309 10/15/15 0315  WBC 7.9 6.5  HGB 12.0* 11.5*  HCT 36.9* 36.4*  MCV 87.2 87.5  PLT 144* 0000000   Basic Metabolic Panel  Recent Labs  10/13/15 1438 10/14/15 0309  NA 143 140  K 3.5 3.5  CL 103 104  CO2 29 25  GLUCOSE 106* 90  BUN 25* 28*  CREATININE 1.71* 1.65*  CALCIUM 8.7* 8.5*   Cardiac Enzymes  Recent Labs  10/12/15 1640  TROPONINI 0.05*    TELE  Atrial flutter with PVCs with good rate control   Radiology/Studies   ASSESSMENT AND PLAN This is a 82 y.o. male with a past medical history significant for CAD with CTO RCA, PAF on pradaxa, ischemic cardiomyopathy, EF as low as 25-30%, recently up to Q000111Q, chronic systolic CHF, HTN, HLD, CKD and history of VT s/p AICD ( @ [facility]) who presented to [facility] on 10/11/15 for  progressive DOE. He was found to be in afib with RVR and acute CHF. ECHO showed EF down to 15-20% with ant WMA and cardiology consulted.   Acute systolic congestive heart failure: Mr. Blee has a history of ischemic cardiomyopathy with ejection fraction of 25-30% and is status post AICD placement. In 2013 his ejection fraction had improved to 45-50%. -- Most recent Echo on 10/11/15 showed EF has significantly reduced to 15-20% with akinesis of the anteroseptal, anterior and apical walls and inferior hypokinesis, there is moderate LAE and mild RAE, a trivial pericardial effusion is noted.   -- With [place] sci ICD. Device was interrogated over the weekend. He is at [facility] and 4:1 flutter. Discussed case with EP and Dr. Lovena Le requested that we make an outpatient appointment with him after this admission. --Weight down 4lbs (155-->151  lbs). Net neg 2L. Lasix restarted yesterday at 40mg  PO.  --Worsening EF with new anterior wall akinesis, L/RHC yesterday showed the following:  Conclusion    1. Mid RCA lesion, 80% stenosed. Dist RCA lesion, 100% stenosed. -- Stable from 2012 2. Ost LM to LM lesion, 40% stenosed. 3. Ost LAD to Mid LAD lesion, 20% stenosed. Dist LAD lesion, 25% stenosed. 1st Diag lesion, 30% stenosed. 4. Ramus lesion, 20% stenosed. 5. There is severe left ventricular systolic dysfunction. Cardiac Output/Index Moderate-Severely Reduced 6. Normal LVEDP of 5-10 mmHg, PCWP of 10 mmHg   Relatively stable coronary disease when compared to 2012 cardiac catheterization. Relatively normal right heart catheterization pressures suggesting adequate diuresis  Patient clearly has an EF of maybe 10-15% with moderate-severely reduced cardiac output and index by Fick.  I suspect that his reduced ejection fraction is related to atrial fibrillation/flutter in the setting of existing ischemic cardio myopathy as opposed to worsening CAD.   Plan:  Return to nursing unit for TR band removal  DC IV Lasix and start by mouth Lasix in the morning  Restart Pradaxa tonight at 2200  Consider restoration of sinus rhythm either through chemical or electrical cardioversion      -Will order BMET to evaluate Cr and determine if stable to start ACE/ARB. Bp soft this morning, will follow and determine if appropriate to add agent.   CAD: Known CAD with occluded RCA and left to right collaterals -with moderate LAD disease. -- With worsening LV function and new WMA, suggested that this finding is from a-fib/a-flutter in the setting of ICM as opposed to worsening CAD.  -- Continue ASA, BB and statin.   Atrial fib/flutter with RVR: Pradaxa restarted yesterday post cath. CHADSVASC score of at least 5. Rate well controlled now. Continue Lopressor 25mg  BID.   CKD: creat improved with diuresis. 1.8--> 1.71--> 1.65. Will order  BMET  Hx of VT: no recent VT on device interrogation   Signed, Reino Bellis NP-C  Pager [phone]   Patient seen and examined. Agree with assessment and plan. No chest pain. Cath reviewed. EF 10 - 15%; follow renal , Will add hydralazine to nitrates.  If renal fxn gets better than ACE-I or ARB.   Troy Sine, MD, [facility] 10/15/2015 6:10 PM

## 2015-10-15 NOTE — Discharge Instructions (Signed)
Atrial Fibrillation °Atrial fibrillation is a type of irregular or rapid heartbeat (arrhythmia). In atrial fibrillation, the heart quivers continuously in a chaotic pattern. This occurs when parts of the heart receive disorganized signals that make the heart unable to pump blood normally. This can increase the risk for stroke, heart failure, and other heart-related conditions. There are different types of atrial fibrillation, including: °· Paroxysmal atrial fibrillation. This type starts suddenly, and it usually stops on its own shortly after it starts. °· Persistent atrial fibrillation. This type often lasts longer than a week. It may stop on its own or with treatment. °· Long-lasting persistent atrial fibrillation. This type lasts longer than 12 months. °· Permanent atrial fibrillation. This type does not go away. °Talk with your health care provider to learn about the type of atrial fibrillation that you have. °CAUSES °This condition is caused by some heart-related conditions or procedures, including: °· A heart attack. °· Coronary artery disease. °· Heart failure. °· Heart valve conditions. °· High blood pressure. °· Inflammation of the sac that surrounds the heart (pericarditis). °· Heart surgery. °· Certain heart rhythm disorders, such as Wolf-Parkinson-White syndrome. °Other causes include: °· Pneumonia. °· Obstructive sleep apnea. °· Blockage of an artery in the lungs (pulmonary embolism, or PE). °· Lung cancer. °· Chronic lung disease. °· Thyroid problems, especially if the thyroid is overactive (hyperthyroidism). °· Caffeine. °· Excessive alcohol use or illegal drug use. °· Use of some medicines, including certain decongestants and diet pills. °Sometimes, the cause cannot be found. °RISK FACTORS °This condition is more likely to develop in: °· People who are older in age. °· People who smoke. °· People who have diabetes mellitus. °· People who are overweight (obese). °· Athletes who exercise  vigorously. °SYMPTOMS °Symptoms of this condition include: °· A feeling that your heart is beating rapidly or irregularly. °· A feeling of discomfort or pain in your chest. °· Shortness of breath. °· Sudden light-headedness or weakness. °· Getting tired easily during exercise. °In some cases, there are no symptoms. °DIAGNOSIS °Your health care provider may be able to detect atrial fibrillation when taking your pulse. If detected, this condition may be diagnosed with: °· An electrocardiogram (ECG). °· A Holter monitor test that records your heartbeat patterns over a 24-hour period. °· Transthoracic echocardiogram (TTE) to evaluate how blood flows through your heart. °· Transesophageal echocardiogram (TEE) to view more detailed images of your heart. °· A stress test. °· Imaging tests, such as a CT scan or chest X-ray. °· Blood tests. °TREATMENT °The main goals of treatment are to prevent blood clots from forming and to keep your heart beating at a normal rate and rhythm. The type of treatment that you receive depends on many factors, such as your underlying medical conditions and how you feel when you are experiencing atrial fibrillation. °This condition may be treated with: °· Medicine to slow down the heart rate, bring the heart's rhythm back to normal, or prevent clots from forming. °· Electrical cardioversion. This is a procedure that resets your heart's rhythm by delivering a controlled, low-energy shock to the heart through your skin. °· Different types of ablation, such as catheter ablation, catheter ablation with pacemaker, or surgical ablation. These procedures destroy the heart tissues that send abnormal signals. When the pacemaker is used, it is placed under your skin to help your heart beat in a regular rhythm. °HOME CARE INSTRUCTIONS °· Take over-the counter and prescription medicines only as told by your health care provider. °·   If your health care provider prescribed a blood-thinning medicine  (anticoagulant), take it exactly as told. Taking too much blood-thinning medicine can cause bleeding. If you do not take enough blood-thinning medicine, you will not have the protection that you need against stroke and other problems.  Do not use tobacco products, including cigarettes, chewing tobacco, and e-cigarettes. If you need help quitting, ask your health care provider.  If you have obstructive sleep apnea, manage your condition as told by your health care provider.  Do not drink alcohol.  Do not drink beverages that contain caffeine, such as coffee, soda, and tea.  Maintain a healthy weight. Do not use diet pills unless your health care provider approves. Diet pills may make heart problems worse.  Follow diet instructions as told by your health care provider.  Exercise regularly as told by your health care provider.  Keep all follow-up visits as told by your health care provider. This is important. PREVENTION  Avoid drinking beverages that contain caffeine or alcohol.  Avoid certain medicines, especially medicines that are used for breathing problems.  Avoid certain herbs and herbal medicines, such as those that contain ephedra or ginseng.  Do not use illegal drugs, such as cocaine and amphetamines.  Do not smoke.  Manage your high blood pressure. SEEK MEDICAL CARE IF:  You notice a change in the rate, rhythm, or strength of your heartbeat.  You are taking an anticoagulant and you notice increased bruising.  You tire more easily when you exercise or exert yourself. SEEK IMMEDIATE MEDICAL CARE IF:  You have chest pain, abdominal pain, sweating, or weakness.  You feel nauseous.  You notice blood in your vomit, bowel movement, or urine.  You have shortness of breath.  You suddenly have swollen feet and ankles.  You feel dizzy.  You have sudden weakness or numbness of the face, arm, or leg, especially on one side of the body.  You have trouble speaking,  trouble understanding, or both (aphasia).  Your face or your eyelid droops on one side. These symptoms may represent a serious problem that is an emergency. Do not wait to see if the symptoms will go away. Get medical help right away. Call your local emergency services (911 in the [place] Do not drive yourself to the hospital.   This information is not intended to replace advice given to you by your health care provider. Make sure you discuss any questions you have with your health care provider.   Document Released: 05/31/2005 Document Revised: 02/19/2015 Document Reviewed: 09/25/2014 Elsevier Interactive Patient Education 2016 [place] on my medicine - Pradaxa (dabigatran)  This medication education was reviewed with me or my healthcare representative as part of my discharge preparation.   Why was Pradaxa prescribed for you? Pradaxa was prescribed for you to reduce the risk of forming blood clots that cause a stroke if you have a medical condition called atrial fibrillation (a type of irregular heartbeat).    What do you Need to know about PradAXa? Take your Pradaxa TWICE DAILY - one capsule in the morning and one tablet in the evening with or without food.  It would be best to take the doses about the same time each day.  The capsules should not be broken, chewed or opened - they must be swallowed whole.  Do not store Pradaxa in other medication containers - once the bottle is opened the Pradaxa should be used within FOUR months; throw away any capsules that havent  been by that time.  Take Pradaxa exactly as prescribed by your doctor.  DO NOT stop taking Pradaxa without talking to the doctor who prescribed the medication.  Stopping without other stroke prevention medication to take the place of Pradaxa may increase your risk of developing a clot that causes a stroke.  Refill your prescription before you run out.  After discharge, you should have regular  check-up appointments with your healthcare provider that is prescribing your Pradaxa.  In the future your dose may need to be changed if your kidney function or weight changes by a significant amount.  What do you do if you miss a dose? If you miss a dose, take it as soon as you remember on the same day.  If your next dose is less than 6 hours away, skip the missed dose.  Do not take two doses of PRADAXA at the same time.  Important Safety Information A possible side effect of Pradaxa is bleeding. You should call your healthcare provider right away if you experience any of the following: ? Bleeding from an injury or your nose that does not stop. ? Unusual colored urine (red or dark brown) or unusual colored stools (red or black). ? Unusual bruising for unknown reasons. ? A serious fall or if you hit your head (even if there is no bleeding).  Some medicines may interact with Pradaxa and might increase your risk of bleeding or clotting while on Pradaxa. To help avoid this, consult your healthcare provider or pharmacist prior to using any new prescription or non-prescription medications, including herbals, vitamins, non-steroidal anti-inflammatory drugs (NSAIDs) and supplements.  This website has more information on Pradaxa (dabigatran): [email or web address]://www.pradaxa.com

## 2015-10-15 NOTE — Consult Note (Signed)
   [facility] CM Inpatient Consult   10/15/2015  Jacob [name] 1932/02/11 [id number]   Patient evaluated for community based chronic disease management services with [facility] as a benefit of patient's Medicare Insurance. Spoke with patient at bedside to explain [facility] services. Patient admitted with Atrial Fibrillation,  has a HX of HF current EF now 15-20% s/p cardiac cath.  Consent form signed Patient will receive post hospital discharge call and will be evaluated for monthly home visits for assessments and disease process education.  Patient states he is currently not walking very well and uses a cane at home.  Patient could benefit from a possible PT evaluation, prior to discharge hopefully. Case discussed in progression meeting, today.   Left contact information and [facility] literature at bedside. Made Inpatient Case Manager aware that [facility] following. Of note, [facility] services does not replace or interfere with any services that are arranged by inpatient case management or social work.  For additional questions or referrals please contact:    Natividad Brood, RN BSN [facility] Liaison  [phone] business mobile phone Toll free office [phone]

## 2015-10-16 LAB — BASIC METABOLIC PANEL
ANION GAP: 10 (ref 5–15)
BUN: 28 mg/dL — ABNORMAL HIGH (ref 6–20)
CHLORIDE: 102 mmol/L (ref 101–111)
CO2: 30 mmol/L (ref 22–32)
Calcium: 8.6 mg/dL — ABNORMAL LOW (ref 8.9–10.3)
Creatinine, Ser: 1.61 mg/dL — ABNORMAL HIGH (ref 0.61–1.24)
GFR, EST AFRICAN AMERICAN: 44 mL/min — AB (ref >60)
GFR, EST NON AFRICAN AMERICAN: 38 mL/min — AB (ref >60)
Glucose, Bld: 108 mg/dL — ABNORMAL HIGH (ref 65–99)
POTASSIUM: 3.4 mmol/L — AB (ref 3.5–5.1)
SODIUM: 142 mmol/L (ref 135–145)

## 2015-10-16 LAB — CBC
HCT: 36.7 % — ABNORMAL LOW (ref 39.0–52.0)
Hemoglobin: 11.8 g/dL — ABNORMAL LOW (ref 13.0–17.0)
MCH: 29 pg (ref 26.0–34.0)
MCHC: 32.2 g/dL (ref 30.0–36.0)
MCV: 90.2 fL (ref 78.0–100.0)
PLATELETS: 165 10*3/uL (ref 150–400)
RBC: 4.07 MIL/uL — AB (ref 4.22–5.81)
RDW: 15.9 % — ABNORMAL HIGH (ref 11.5–15.5)
WBC: 7 10*3/uL (ref 4.0–10.5)

## 2015-10-16 LAB — BRAIN NATRIURETIC PEPTIDE: B NATRIURETIC PEPTIDE 5: 791 pg/mL — AB (ref 0.0–100.0)

## 2015-10-16 MED ORDER — HYDRALAZINE HCL 25 MG PO TABS
37.5000 mg | ORAL_TABLET | Freq: Three times a day (TID) | ORAL | Status: DC
  Administered 2015-10-17: 37.5 mg via ORAL
  Filled 2015-10-16 (×2): qty 2

## 2015-10-16 MED ORDER — ISOSORBIDE MONONITRATE ER 60 MG PO TB24
60.0000 mg | ORAL_TABLET | Freq: Every day | ORAL | Status: DC
Start: 2015-10-17 — End: 2015-10-17
  Administered 2015-10-17: 60 mg via ORAL
  Filled 2015-10-16: qty 1

## 2015-10-16 MED ORDER — ENSURE ENLIVE PO LIQD
237.0000 mL | Freq: Three times a day (TID) | ORAL | Status: DC
  Administered 2015-10-16: 237 mL via ORAL

## 2015-10-16 NOTE — Progress Notes (Signed)
Patient Name: Jacob [name] Date of Encounter: 10/16/2015   Principal Problem:   Atrial fibrillation ([place]) Active Problems:   CORONARY ATHEROSCLEROSIS, NATIVE VESSEL   Acute combined systolic and diastolic heart failure (HCC)   Automatic implantable cardioverter-defibrillator in situ   Cardiomyopathy ([place])   Diplopia   Acute on chronic combined systolic and diastolic congestive heart failure (HCC)   Atrial fibrillation with RVR (HCC)   Cardiomyopathy, ischemic   Coronary artery disease involving native heart    SUBJECTIVE  No complaints.  Feeling well.  CURRENT MEDS . antiseptic oral rinse  7 mL Mouth Rinse BID  . dabigatran  150 mg Oral Q12H  . DULoxetine  40 mg Oral Daily  . feeding supplement (ENSURE ENLIVE)  237 mL Oral BID BM  . finasteride  5 mg Oral Daily  . fluticasone  1 spray Each Nare Daily  . furosemide  40 mg Oral Daily  . hydrALAZINE  25 mg Oral Q8H  . isosorbide mononitrate  30 mg Oral Daily  . metoprolol tartrate  25 mg Oral BID  . pantoprazole  40 mg Oral Daily  . rosuvastatin  10 mg Oral QHS  . sodium chloride flush  3 mL Intravenous Q12H  . sodium chloride flush  3 mL Intravenous Q12H    OBJECTIVE  Filed Vitals:   10/15/15 0940 10/15/15 1925 10/16/15 0426 10/16/15 1013  BP: 105/59 99/72 128/76 120/77  Pulse: 87 74 69 79  Temp:  97.7 F (36.5 C) 98.1 F (36.7 C)   TempSrc:  Oral Oral   Resp:  18 18   Height:      Weight:   152 lb 11.2 oz (69.264 kg)   SpO2:  100% 97%     Intake/Output Summary (Last 24 hours) at 10/16/15 1250 Last data filed at 10/16/15 1029  Gross per 24 hour  Intake   1180 ml  Output    625 ml  Net    555 ml   Filed Weights   10/14/15 0547 10/15/15 0431 10/16/15 0426  Weight: 153 lb 8 oz (69.627 kg) 151 lb 12.8 oz (68.856 kg) 152 lb 11.2 oz (69.264 kg)    PHYSICAL EXAM  General: Pleasant, NAD. Neuro: Alert and oriented X 3. Moves all extremities spontaneously. Psych: Normal affect. HEENT:  Normal  Neck:  Supple without bruits or JVD. Lungs:  Resp regular and unlabored, CTA. Heart: irreg irreg. no s3, s4, or murmurs. Abdomen: Soft, non-tender, non-distended, BS + x 4.  Extremities: No clubbing, cyanosis or edema. DP/PT/Radials 2+ and equal bilaterally. Right wrist without bruising, or hematoma.  Accessory Clinical Findings  CBC  Recent Labs  10/15/15 0315 10/16/15 0413  WBC 6.5 7.0  HGB 11.5* 11.8*  HCT 36.4* 36.7*  MCV 87.5 90.2  PLT 176 [id number]   Basic Metabolic Panel  Recent Labs  10/15/15 1304 10/16/15 0413  NA 141 142  K 3.3* 3.4*  CL 105 102  CO2 25 30  GLUCOSE 101* 108*  BUN 25* 28*  CREATININE 1.56* 1.61*  CALCIUM 8.7* 8.6*   TELE  Atrial flutter with PVCs with good rate control   Radiology/Studies   ASSESSMENT AND PLAN This is a 82 y.o. male with a past medical history significant for CAD with CTO RCA, PAF on pradaxa, ischemic cardiomyopathy, EF as low as 25-30%, recently up to [id number], chronic systolic CHF, HTN, HLD, CKD and history of VT s/p AICD ( @ [facility]) who presented [facility] on 10[facility]11/15 for progressive DOE. He  was found to be in afib with RVR and acute CHF. ECHO showed EF down to 15-20% with ant WMA and cardiology consulted.   1. Acute systolic congestive heart failure: Mr. Garcia has a history of ischemic cardiomyopathy with ejection fraction of 25-30% and is status post AICD placement. In 2013 his ejection fraction had improved to 45-50%. -- Most recent Echo on 10/11/15 showed EF has significantly reduced to 15-20% with akinesis of the anteroseptal, anterior and apical walls and inferior hypokinesis, there is moderate LAE and mild RAE, a trivial pericardial effusion is noted.   --Weight down 4lbs (155-->152 lbs). Net neg 1.5L. Lasix restarted 10/14/2015 at 40mg  PO. Tolerating well --Worsening EF with new anterior wall akinesis, L/RHC 10/14/2015 showed the following:  Conclusion    1. Mid RCA lesion, 80% stenosed. Dist RCA lesion, 100% stenosed. -- Stable from  2012 2. Ost LM to LM lesion, 40% stenosed. 3. Ost LAD to Mid LAD lesion, 20% stenosed. Dist LAD lesion, 25% stenosed. 1st Diag lesion, 30% stenosed. 4. Ramus lesion, 20% stenosed. 5. There is severe left ventricular systolic dysfunction. Cardiac Output/Index Moderate-Severely Reduced 6. Normal LVEDP of 5-10 mmHg, PCWP of 10 mmHg   Relatively stable coronary disease when compared to 2012 cardiac catheterization. Relatively normal right heart catheterization pressures suggesting adequate diuresis  Patient clearly has an EF of maybe 10-15% with moderate-severely reduced cardiac output and index by Fick.  I suspect that his reduced ejection fraction is related to atrial fibrillation/flutter in the setting of existing ischemic cardio myopathy as opposed to worsening CAD.   Plan:  Return to nursing unit for TR band removal  DC IV Lasix and start by mouth Lasix in the morning  Restart Pradaxa tonight at 2200  Consider restoration of sinus rhythm either through chemical or electrical cardioversion      -AM BMET showed slight bump in Cr level, will hold off on adding ACE/ARB at this time.  -Hydralazine was added 10/15/2015  2. CAD: Known CAD with occluded RCA and left to right collaterals -with moderate LAD disease. -- With worsening LV function and new WMA, suggested that this finding is from a-fib/a-flutter in the setting of ICM as opposed to worsening CAD.  -- On appropriate therapy, continue ASA, BB and statin.   Atrial fib/flutter with RVR: Pradaxa restarted 10/14/2015 post cath. CHADSVASC score of at least 5. Rate well controlled now.  --Continue Lopressor 25mg  BID.  -- With [employer] ICD. Device was interrogated over the weekend. He is at [facility] and 4:1 flutter. Discussed case with EP and Dr. Lovena Le requested that we make an outpatient appointment with him after this admission.  CKD: creat with slight bump 1.56>>1.61  Hx of VT: no recent VT on device interrogation   -Possible  discharge soon?, will need f/u BMET at office visit  Signed, Reino Bellis NP-C     Patient seen and examined. Agree with assessment and plan. Cr increased to 1.61 today. I/O since admission -1604, but +255 yesterday and +460 today. K 3.4.  Will keep today.  Will increase hydralazine to 37.5 mg q 8 hrs and titrate isosorbide in am to 60 mg. K replete.  Aim for dc tomorrow.   Troy Sine, MD, [facility] 10/16/2015 4:39 PM

## 2015-10-16 NOTE — Research (Signed)
[email or web address]  Informed Consent   Subject Name: Jacob [name]  Subject met inclusion and exclusion criteria.  The informed consent form, study requirements and expectations were reviewed with the subject and questions and concerns were addressed prior to the signing of the consent form.  The subject verbalized understanding of the trail requirements.  The subject agreed to participate in the [email or web address]  trial and signed the informed consent.  The informed consent was obtained prior to performance of any protocol-specific procedures for the subject.  A copy of the signed informed consent was given to the subject and a copy was placed in the subject's medical record.  Sandie Ano 10/16/2015, 12:50

## 2015-10-16 NOTE — Progress Notes (Signed)
Nutrition Follow-up   INTERVENTION:  Continue Ensure Enlive po TID, each supplement provides 350 kcal and 20 grams of protein Encourage PO intake   NUTRITION DIAGNOSIS:   Increased nutrient needs related to chronic illness as evidenced by estimated needs.  Ongoing  GOAL:   Patient will meet greater than or equal to 90% of their needs  Unmet  MONITOR:   PO intake, Supplement acceptance, Labs, Weight trends, I & O's  REASON FOR ASSESSMENT:   Malnutrition Screening Tool    ASSESSMENT:   82 y.o. male with medical history significant of ischemic cardiomyopathy with his last transthoracic echocardiogram performed on 11/08/2011 that showed an EF of 45-50%, history of atrial fibrillation who had been anticoagulated with Pradaxa, history of ventricular tachycardia, status post ICD implantation, hypertension, dyslipidemia, presenting to the emergency department with complaints of shortness of breath.  Pt states that he has had a poor appetite for the past year, mostly due to depression. He reports that he has been eating about 50% less than he used to and he has lost from 170 lbs to 152 lbs in the past year. He reports following a low sodium/NAS diet at home.  He states that he has been eating about 50% of meals since admission and has been drinking Ensure. Pt has mild fat wasting and mild to moderate muscle wasting per nutrition-focused physical exam. RD encouraged pt to continue drinking Ensure Enlive 2-3 times per day to supplement poor PO intake at meals. Ensure coupons provided to family member who plans to purchase some for when pt gets home.   Labs: low calcium, low hemoglobin  Diet Order:  Diet 2 gram sodium Room service appropriate?: Yes; Fluid consistency:: Thin; Fluid restriction:: 1200 mL Fluid  Skin:  Reviewed, no issues  Last BM:  5/3  Height:   Ht Readings from Last 1 Encounters:  10/12/15 5' 7.5" (1.715 m)    Weight:   Wt Readings from Last 1 Encounters:   10/16/15 152 lb 11.2 oz (69.264 kg)    Ideal Body Weight:  67.2 kg  BMI:  Body mass index is 23.55 kg/(m^2).  Estimated Nutritional Needs:   Kcal:  1700-1900  Protein:  90-95 grams  Fluid:  1.7-1.9 L/day  EDUCATION NEEDS:   No education needs identified at this time  [place], LDN Inpatient Clinical Dietitian Pager: [phone] After Hours Pager: [phone]

## 2015-10-17 LAB — BASIC METABOLIC PANEL
Anion gap: 10 (ref 5–15)
BUN: 28 mg/dL — AB (ref 6–20)
CHLORIDE: 102 mmol/L (ref 101–111)
CO2: 29 mmol/L (ref 22–32)
CREATININE: 1.52 mg/dL — AB (ref 0.61–1.24)
Calcium: 8.6 mg/dL — ABNORMAL LOW (ref 8.9–10.3)
GFR calc Af Amer: 47 mL/min — ABNORMAL LOW (ref >60)
GFR calc non Af Amer: 41 mL/min — ABNORMAL LOW (ref >60)
GLUCOSE: 92 mg/dL (ref 65–99)
POTASSIUM: 3.4 mmol/L — AB (ref 3.5–5.1)
Sodium: 141 mmol/L (ref 135–145)

## 2015-10-17 LAB — CBC
HEMATOCRIT: 36.2 % — AB (ref 39.0–52.0)
HEMOGLOBIN: 11.5 g/dL — AB (ref 13.0–17.0)
MCH: 28.4 pg (ref 26.0–34.0)
MCHC: 31.8 g/dL (ref 30.0–36.0)
MCV: 89.4 fL (ref 78.0–100.0)
Platelets: 157 10*3/uL (ref 150–400)
RBC: 4.05 MIL/uL — ABNORMAL LOW (ref 4.22–5.81)
RDW: 15.9 % — AB (ref 11.5–15.5)
WBC: 7.7 10*3/uL (ref 4.0–10.5)

## 2015-10-17 LAB — BRAIN NATRIURETIC PEPTIDE: B Natriuretic Peptide: 685.2 pg/mL — ABNORMAL HIGH (ref 0.0–100.0)

## 2015-10-17 MED ORDER — ISOSORBIDE MONONITRATE ER 30 MG PO TB24: 60.0000 mg | ORAL_TABLET | Freq: Every day | ORAL | Status: DC

## 2015-10-17 MED ORDER — ASPIRIN 81 MG PO TBEC: 81.0000 mg | DELAYED_RELEASE_TABLET | Freq: Every day | ORAL | Status: DC

## 2015-10-17 MED ORDER — HYDRALAZINE HCL 25 MG PO TABS: 37.5000 mg | ORAL_TABLET | Freq: Three times a day (TID) | ORAL | Status: DC

## 2015-10-17 MED ORDER — METOPROLOL TARTRATE 25 MG PO TABS: 25.0000 mg | ORAL_TABLET | Freq: Two times a day (BID) | ORAL | Status: DC

## 2015-10-17 MED ORDER — ROSUVASTATIN CALCIUM 10 MG PO TABS: 10.0000 mg | ORAL_TABLET | Freq: Every day | ORAL | Status: DC

## 2015-10-17 MED ORDER — FUROSEMIDE 40 MG PO TABS: 40.0000 mg | ORAL_TABLET | Freq: Every day | ORAL | Status: DC

## 2015-10-17 MED ORDER — ASPIRIN EC 81 MG PO TBEC: 81.0000 mg | DELAYED_RELEASE_TABLET | Freq: Every day | ORAL | Status: DC

## 2015-10-17 MED ORDER — POTASSIUM CHLORIDE CRYS ER 20 MEQ PO TBCR
40.0000 meq | EXTENDED_RELEASE_TABLET | Freq: Once | ORAL | Status: AC
  Administered 2015-10-17: 40 meq via ORAL
  Filled 2015-10-17: qty 2

## 2015-10-17 NOTE — Care Management Important Message (Signed)
Important Message  Patient Details  Name: CECILE BUTORAC MRN: [id number] Date of Birth: 1932-06-12   Medicare Important Message Given:  Yes    Amol Domanski P Trulee Hamstra 10/17/2015, 12:55 PM

## 2015-10-17 NOTE — Progress Notes (Signed)
Patient Name: Jacob [name] Date of Encounter: 10/17/2015   Principal Problem:   Atrial fibrillation ([place]) Active Problems:   CORONARY ATHEROSCLEROSIS, NATIVE VESSEL   Acute combined systolic and diastolic heart failure (HCC)   Automatic implantable cardioverter-defibrillator in situ   Cardiomyopathy ([place])   Diplopia   Acute on chronic combined systolic and diastolic congestive heart failure (HCC)   Atrial fibrillation with RVR (HCC)   Cardiomyopathy, ischemic   Coronary artery disease involving native heart    SUBJECTIVE  Breathing well; no chest pain  CURRENT MEDS . antiseptic oral rinse  7 mL Mouth Rinse BID  . dabigatran  150 mg Oral Q12H  . DULoxetine  40 mg Oral Daily  . feeding supplement (ENSURE ENLIVE)  237 mL Oral TID BM  . finasteride  5 mg Oral Daily  . fluticasone  1 spray Each Nare Daily  . furosemide  40 mg Oral Daily  . hydrALAZINE  37.5 mg Oral Q8H  . isosorbide mononitrate  60 mg Oral Daily  . metoprolol tartrate  25 mg Oral BID  . pantoprazole  40 mg Oral Daily  . rosuvastatin  10 mg Oral QHS  . sodium chloride flush  3 mL Intravenous Q12H  . sodium chloride flush  3 mL Intravenous Q12H    OBJECTIVE  Filed Vitals:   10/16/15 1637 10/16/15 2209 10/17/15 0436 10/17/15 0800  BP: 113/72 99/49 118/75 124/64  Pulse:  72 72 74  Temp:  97.1 F (36.2 C) 98.4 F (36.9 C) 98.1 F (36.7 C)  TempSrc:  Oral Oral Oral  Resp:  18 18 18   Height:      Weight:   153 lb 9.6 oz (69.673 kg)   SpO2:  97% 94% 96%    Intake/Output Summary (Last 24 hours) at 10/17/15 0852 Last data filed at 10/17/15 0839  Gross per 24 hour  Intake   1177 ml  Output   1050 ml  Net    127 ml   Filed Weights   10/15/15 0431 10/16/15 0426 10/17/15 0436  Weight: 151 lb 12.8 oz (68.856 kg) 152 lb 11.2 oz (69.264 kg) 153 lb 9.6 oz (69.673 kg)    PHYSICAL EXAM  General: Pleasant, NAD. Neuro: Alert and oriented X 3. Moves all extremities spontaneously. Psych: Normal  affect. HEENT:  Normal  Neck: Supple without bruits or JVD. Lungs:  Resp regular and unlabored, CTA. Heart: irreg irreg. no s3, s4, or murmurs. Abdomen: Soft, non-tender, non-distended, BS + x 4.  Extremities: No clubbing, cyanosis or edema. DP/PT/Radials 2+ and equal bilaterally. Right wrist without bruising, or hematoma.  Accessory Clinical Findings  CBC  Recent Labs  10/16/15 0413 10/17/15 0506  WBC 7.0 7.7  HGB 11.8* 11.5*  HCT 36.7* 36.2*  MCV 90.2 89.4  PLT 165 725   Basic Metabolic Panel  Recent Labs  10/16/15 0413 10/17/15 0506  NA 142 141  K 3.4* 3.4*  CL 102 102  CO2 30 29  GLUCOSE 108* 92  BUN 28* 28*  CREATININE 1.61* 1.52*  CALCIUM 8.6* 8.6*   Hepatic Function Latest Ref Rng 10/11/2015 02/23/2013 02/22/2013  Total Protein 6.5 - 8.1 g/dL 8.0 7.0 6.9  Albumin 3.5 - 5.0 g/dL 4.2 3.5 3.5  AST 15 - 41 U/L 17 14 13   ALT 17 - 63 U/L 10(L) 6 6  Alk Phosphatase 38 - 126 U/L 81 74 74  Total Bilirubin 0.3 - 1.2 mg/dL 2.1(H) 0.5 0.5   BNP (last 3 results)  Recent Labs  10/11/15 0950 10/16/15 0413 10/17/15 0509  BNP 2667.6* 791.0* 685.2*    ProBNP (last 3 results) No results for input(s): PROBNP in the last 8760 hours.  TELE  Atrial flutter with PVCs with good rate control   Radiology/Studies   ASSESSMENT AND PLAN This is a 82 y.o. male with a past medical history significant for CAD with CTO RCA, PAF on pradaxa, ischemic cardiomyopathy, EF as low as 25-30%, recently up to 29-56%, chronic systolic CHF, HTN, HLD, CKD and history of VT s/p AICD ( @ [facility]) who presented to [facility] on 10/11/15 for progressive DOE. He was found to be in afib with RVR and acute CHF. ECHO showed EF down to 15-20% with ant WMA and cardiology consulted.   1. Acute systolic congestive heart failure: Mr. Rawles has a history of ischemic cardiomyopathy with ejection fraction of 25-30% and is status post AICD placement. In 2013 his ejection fraction had improved to 45-50%. --  Echo on  10/11/15 showed EF has significantly reduced to 15-20% with akinesis of the anteroseptal, anterior and apical walls and inferior hypokinesis, there is moderate LAE and mild RAE, a trivial pericardial effusion is noted.   --Weight down 4lbs (155-->152 lbs). Net neg 1.5L. Lasix restarted 10/14/2015 at 57m PO. Tolerating well --Worsening EF with new anterior wall akinesis, L/RHC 10/14/2015 showed the following:  Conclusion    1. Mid RCA lesion, 80% stenosed. Dist RCA lesion, 100% stenosed. -- Stable from 2012 2. Ost LM to LM lesion, 40% stenosed. 3. Ost LAD to Mid LAD lesion, 20% stenosed. Dist LAD lesion, 25% stenosed. 1st Diag lesion, 30% stenosed. 4. Ramus lesion, 20% stenosed. 5. There is severe left ventricular systolic dysfunction. Cardiac Output/Index Moderate-Severely Reduced 6. Normal LVEDP of 5-10 mmHg, PCWP of 10 mmHg   Relatively stable coronary disease when compared to 2012 cardiac catheterization. Relatively normal right heart catheterization pressures suggesting adequate diuresis  Patient clearly has an EF of maybe 10-15% with moderate-severely reduced cardiac output and index by Fick.  I suspect that his reduced ejection fraction is related to atrial fibrillation/flutter in the setting of existing ischemic cardio myopathy as opposed to worsening CAD.   Plan:  Return to nursing unit for TR band removal  DC IV Lasix and start by mouth Lasix in the morning  Restart Pradaxa tonight at 2200  Consider restoration of sinus rhythm either through chemical or electrical cardioversion      -AM BMET showed slight bump in Cr level, will hold off on adding ACE/ARB at this time.  -Hydralazine was added 10/15/2015 and titrated yesterday  2. CAD: Known CAD with occluded RCA and left to right collaterals -with moderate LAD disease. -- With worsening LV function and new WMA, suggested that this finding is from a-fib/a-flutter in the setting of ICM as opposed to worsening CAD.  -- On  appropriate therapy, continue ASA, BB and statin.   3. Atrial fib/flutter with RVR: Pradaxa restarted 10/14/2015 post cath. CHADSVASC score of at least 5. Rate well controlled now.  --Continue Lopressor 261mBID.  -- With [employer] [employer] ICD. Device was interrogated over the weekend. [facility] Discussed case with EP and Dr. TaLovena Leequested that we make an outpatient appointment with him after this admission.  4. CKD: creat with slight bump 1.56>>1.61>>1.52 today;  Stage 3 CKD  5. Hx of VT: no recent VT on device interrogation   6. K 3.4 today  I/O -1937 since admission. Tolerating med titration  with nitrate/hydralazine. K replete.  Will dc today; as renal fxbn stabilizes consider adding low dose spironolactone. Needs EP f/u with Dr. Lovena Le with ICD at [facility].  Troy Sine, MD, [facility] 10/17/2015 8:52 AM

## 2015-10-17 NOTE — Progress Notes (Signed)
ReDS Vest Discharge Study  Results of ReDS reading  Your patient is in the Unblinded arm of the Vest at Discharge study.  The ReDS reading is:  26  ( < 39)  Your patient is ok for discharge.  Thank You   The research team

## 2015-10-17 NOTE — Discharge Summary (Signed)
Discharge Summary    Patient ID: MOAAZ BONCZEK,  MRN: [id number], DOB/AGE: 02/09/1932 82 y.o.  Admit date: 10/11/2015 Discharge date: 10/18/2015  Primary Care Provider: Binnie Rail Primary Cardiologist: Dr. Lovena Le  Discharge Diagnoses    Principal Problem:   Atrial fibrillation with RVR ([place]) Active Problems:   CORONARY ATHEROSCLEROSIS, NATIVE VESSEL   Atrial fibrillation (HCC)   Acute combined systolic and diastolic heart failure (HCC)   Automatic implantable cardioverter-defibrillator in situ   Cardiomyopathy ([place])   Diplopia   Acute on chronic combined systolic and diastolic congestive heart failure (HCC)   Cardiomyopathy, ischemic   Coronary artery disease involving native heart   History of Present Illness     Jacob [name] is a 82 y.o. male with past medical history of ischemic cardiomyopathy (EF as low as 25-30%, recently up to [id number]), chronic systolic congestive heart failure, HTN, dyslipidemia, history of VT and atrial fibrillation (s/p AICD implantation, on Pradaxa) who presented to [facility] ED on 10/12/2015 for progressive DOE. He was found to be in atrial fib with RVR and started on IV Cardizem while in the ED. His BNP was found to be elevated at 2667.   He was admitted for management of his HR and further diuresis.  Hospital Course   He reported recent use of Mucinex DM, for upper respiratory infection which was suggested as the cause of acute CHF. Initially Internal medicine was initially contacted for admission. IV Lasix 40 mg twice a day was ordered along with BB therapy.Cardizem drip was discontinued due to improved HR. Cardiology was consulted in relation to EKG changes of anterior lateral ST depression and recent echocardiogram showing new anterior wall akinesis which was suggestive of an LAD infarct, and an EF decrease of 15-20% from previous echo on 11/08/2011 of 45-50%. His initial troponins had a mild elevation of 0.05. He was transferred to [facility] with the plan for left and right heart cath. He received a left and right heart cath on 10/14/2015 which showed a stable mid RCA lesion (80% stenosis) along with distal RCA lesion (100% stenosis). There was severe left ventricular systolic dysfunction consistent with an EF of 10-15%. His coronary artery disease was thought to be relatively stable, with normal right heart pressures consistent with adequate diuresis. It was suggested that his reduced ejection fraction was related to his atrial fibrillation/flutter in the setting of ischemic cardiomyopathy as opposed to worsening coronary artery disease. At that time it was recommended to DC his IV Lasix and restart PO along with his Pradaxa. He was net -1.5 L, with a slight upward trend in his creatinine, with a post-cath creatinine of 1.61. His BNP improved from 2667-791 during admission, with his Lasix dose being adjusted ultimately to 40 mg daily.Discharge weight was 153 lbs.    He sustained atrial flutter throughout his admission, with good rate control on Lopressor 25 mg BID. His ICD was interrogated and showed no episodes of VT. This was discussed with EP and it was requested that an outpatient appointment be made at time of discharge. On 10/16/2015 hydralazine 37.5mg  TID was added to his medication regimen.   The patient was last examined by Dr. Claiborne Billings and deemed stable for discharge. As part of the ReDS Vest study this was applied prior to discharge and an acceptable reading was noted indicating appropriate volume status. Outpatient follow-up has been arranged.   _____________  Discharge Vitals Blood pressure 124/64, pulse 74, temperature 98.1 F (36.7 C),  temperature source Oral, resp. rate 18, height 5' 7.5" (1.715 m), weight 153 lb 9.6 oz (69.673 kg), SpO2 96 %.  Filed Weights   10/15/15 0431 10/16/15 0426 10/17/15 0436  Weight: 151 lb 12.8 oz (68.856 kg) 152 lb 11.2 oz (69.264 kg) 153 lb 9.6 oz (69.673 kg)    Labs & Radiologic Studies       CBC  Recent Labs  10/16/15 0413 10/17/15 0506  WBC 7.0 7.7  HGB 11.8* 11.5*  HCT 36.7* 36.2*  MCV 90.2 89.4  PLT 165 [id number]   Basic Metabolic Panel  Recent Labs  10/16/15 0413 10/17/15 0506  NA 142 141  K 3.4* 3.4*  CL 102 102  CO2 30 29  GLUCOSE 108* 92  BUN 28* 28*  CREATININE 1.61* 1.52*  CALCIUM 8.6* 8.6*   Ct Head Wo Contrast: 10/11/2015  CLINICAL DATA:  Intermittent diplopia EXAM: CT HEAD WITHOUT CONTRAST TECHNIQUE: Contiguous axial images were obtained from the base of the skull through the vertex without intravenous contrast. COMPARISON:  02/24/2013 FINDINGS: Global atrophy. Chronic ischemic changes in the periventricular white matter. Lacunar infarcts are present in the bilateral basal ganglia. There is no mass effect, midline shift, or acute hemorrhage. Mastoid air cells are clear. Cranium is intact. IMPRESSION: No acute intracranial pathology. Chronic ischemic changes and atrophy are noted. Electronically Signed   By: Marybelle Killings M.D.   On: 10/11/2015 14:33   Dg Chest Portable 1 View: 10/11/2015  CLINICAL DATA:  82 year old male with a history of shortness of breath. EXAM: PORTABLE CHEST 1 VIEW COMPARISON:  01/23/2014 FINDINGS: Cardiomediastinal silhouette likely unchanged, borderline enlarged. Heart borders obscured by overlying lung and pleural disease. Dense opacities at the lung bases obscuring the bilateral hemidiaphragm and the left retrocardiac region. No pneumothorax. Interlobular septal thickening. Pacing device/AICD on the left chest wall with 2 leads in place, unchanged. No displaced fracture. IMPRESSION: Evidence of congestive heart failure with bilateral pleural effusions and/or atelectasis. Signed, Dulcy Fanny. Earleen Newport, DO [facility] Electronically Signed   By: Corrie Mckusick D.O.   On: 10/11/2015 10:45   Diagnostic Studies/Procedures    Cardiac Catheterization: 10/14/2015 1. Mid RCA lesion, 80% stenosed.  Dist RCA lesion, 100% stenosed. -- Stable from 2012 2. Ost LM to LM lesion, 40% stenosed. 3. Ost LAD to Mid LAD lesion, 20% stenosed. Dist LAD lesion, 25% stenosed. 1st Diag lesion, 30% stenosed. 4. Ramus lesion, 20% stenosed. 5. There is severe left ventricular systolic dysfunction. Cardiac Output/Index Moderate-Severely Reduced 6. Normal LVEDP of 5-10 mmHg, PCWP of 10 mmHg   Relatively stable coronary disease when compared to 2012 cardiac catheterization. Relatively normal right heart catheterization pressures suggesting adequate diuresis  Patient clearly has an EF of maybe 10-15% with moderate-severely reduced cardiac output and index by Fick.  I suspect that his reduced ejection fraction is related to atrial fibrillation/flutter in the setting of existing ischemic cardio myopathy as opposed to worsening CAD.   Plan:  Return to nursing unit for TR band removal  DC IV Lasix and start by mouth Lasix in the morning  Restart Pradaxa tonight at 2200  Consider restoration of sinus rhythm either through chemical or electrical cardioversion   Echocardiogram: 10/11/2015 Study Conclusions  - Left ventricle: The cavity size was normal. Systolic function was  normal. The estimated ejection fraction was in the range of 15%  to 20%. Akinesis of the anteroseptal, anterior, and apical  myocardium. Hypokinesis of the inferior myocardium. - Mitral valve: There was  moderate regurgitation. - Left atrium: The atrium was moderately dilated. - Right atrium: The atrium was mildly dilated. - Pericardium, extracardiac: A trivial pericardial effusion was  identified.  Disposition   Pt is being discharged home today in good condition.  Follow-up Plans & Appointments    Follow-up Information    Follow up with Binnie Rail, MD. Go on 10/28/2015.   Specialty:  Internal Medicine   Why:  @1 :30pm   Contact information:   [place] [address] [phone]       Follow up  with Cristopher Peru, MD On 10/31/2015.   Specialty:  Cardiology   Why:  Cardiology Hospital Follow-Up on 10/31/2015 at 4:15PM.   Contact information:   [address] [address] [phone]      Discharge Instructions    AMB Referral to [facility] Management    Complete by:  As directed   Reason for consult:  Post hospital monitoring - Atrial Fib RVR  Expected date of contact:  1-3 days (reserved for hospital discharges)  Please assign patient to social worker for post hospital follow up at skilled facility.  Please assign to community nurse for transition of care calls and assess for home visits. Questions please call:   Natividad Brood, RN BSN [facility] Liaison  [phone] business mobile phone Toll free office [phone]     Diet - low sodium heart healthy    Complete by:  As directed      Increase activity slowly    Complete by:  As directed            Discharge Medications   Discharge Medication List as of 10/17/2015  1:29 PM    START taking these medications   Details  aspirin EC 81 MG EC tablet Take 1 tablet (81 mg total) by mouth daily., Starting 10/18/2015, Until Discontinued, No Print    hydrALAZINE (APRESOLINE) 25 MG tablet Take 1.5 tablets (37.5 mg total) by mouth every 8 (eight) hours., Starting 10/17/2015, Until Discontinued, Normal    metoprolol tartrate (LOPRESSOR) 25 MG tablet Take 1 tablet (25 mg total) by mouth 2 (two) times daily., Starting 10/17/2015, Until Discontinued, Normal    rosuvastatin (CRESTOR) 10 MG tablet Take 1 tablet (10 mg total) by mouth at bedtime., Starting 10/17/2015, Until Discontinued, Normal      CONTINUE these medications which have CHANGED   Details  furosemide (LASIX) 40 MG tablet Take 1 tablet (40 mg total) by mouth daily., Starting 10/17/2015, Until Discontinued, Normal    isosorbide mononitrate (IMDUR) 30 MG 24 hr tablet Take 2 tablets (60 mg total) by mouth daily., Starting 10/17/2015,  Until Discontinued, No Print      CONTINUE these medications which have NOT CHANGED   Details  dabigatran (PRADAXA) 150 MG CAPS capsule Take 1 capsule (150 mg total) by mouth 2 (two) times daily., Starting 07/07/2015, Until Discontinued, Normal    DULoxetine (CYMBALTA) 20 MG capsule Take 2 capsules (40 mg total) by mouth daily., Starting 08/18/2015, Until Discontinued, Normal    finasteride (PROSCAR) 5 MG tablet Take 1 tablet (5 mg total) by mouth daily., Starting 07/07/2015, Until Discontinued, Normal    fluticasone (FLONASE) 50 MCG/ACT nasal spray SHAKE LIQUID AND USE 2 SPRAYS IN EACH NOSTRIL DAILY AS NEEDED FOR ALLERGIES OR RHINITIS, Normal    HYDROcodone-acetaminophen (NORCO) 7.5-325 MG tablet Take 1 tablet by mouth every 6 (six) hours as needed for moderate pain., Until Discontinued, Historical Med  NITROSTAT 0.4 MG SL tablet Place 0.4 mg under the tongue every 5 (five) minutes as needed. For chest pain, Starting 08/13/2011, Until Discontinued, Historical Med    omeprazole (PRILOSEC) 40 MG capsule Take 1 capsule (40 mg total) by mouth daily., Starting 08/18/2015, Until Discontinued, Normal    ranitidine (ZANTAC) 300 MG tablet Take 1 tablet (300 mg total) by mouth at bedtime., Starting 08/18/2015, Until Discontinued, Normal    solifenacin (VESICARE) 5 MG tablet Take 5 mg by mouth daily. , Until Discontinued, Historical Med      STOP taking these medications     aspirin 325 MG tablet      carvedilol (COREG) 6.25 MG tablet      simvastatin (ZOCOR) 40 MG tablet           Allergies Allergies  Allergen Reactions  . Bactrim [Sulfamethoxazole-Trimethoprim] Other (See Comments)    Throat swelling  . Penicillins Itching, Swelling and Rash     Outstanding Labs/Studies   None  Duration of Discharge Encounter   Greater than 30 minutes including physician time.  Signed, Erma Heritage, PA-C 10/18/2015, 11:10 AM

## 2015-10-17 NOTE — Progress Notes (Signed)
Heart Failure Navigator Consult Note  Presentation: Jacob [name] has a history of ischemic cardiomyopathy with ejection fraction of 25-30% and is status post AICD placement. In 2013 his ejection fraction had improved to 45-50%. He has known CAD with occluded RCA and left-to-right collaterals and at last catheterization in May 2012. He had mild to moderate LAD and circumflex disease. He was recently admitted with increasing shortness of breath and was in atrial fibrillation, rapid ventricular response. An echo Doppler study of 10/11/2015 showed an EF of 15-20%. and there is now evidence for akinesis of the anteroseptal anterior apical wall with inferior hypokinesis. His device was also recently interrogated and he is at [facility]. With his markedly reduced LV function and new anterior wall motion abnormality suggestive of LAD territory normality definitive cardiac.tk catheterization was recommended and discussed with the patient in detail. His Pradaxa has been held with his last dose being on 10/11/2015. He has renal insufficiency, but creatinine today is improved to 1.71. Lasix is on hold and the patient has been on gentle hydration. Plans will be for right left heart cardiac catheterization tomorrow. The risks and benefits of a cardiac catheterization including, but not limited to, death, stroke, MI, kidney damage and bleeding were discussed with the patient who indicates understanding and agrees to    Past Medical History  Diagnosis Date  . Chronic systolic heart failure (HCC)     EF 25-30% in past;  Echocardiogram 11/08/11: Normal LV wall thickness, EF 45-50%.  Marland Kitchen HTN (hypertension)   . History of diverticulitis of colon   . GERD (gastroesophageal reflux disease)   . History of nephrolithiasis   . Ischemic cardiomyopathy     s/p ICD 2008 (see CHF for EF)  . Urinary retention     foley indwelling 12/2010-04/2011  . Arthritis   . HLD (hyperlipidemia)   . Coronary artery disease     a.  Chronically occ RCA. b. Last cardiac catheterization 10/2010: Mid LAD 20-30%, distal LAD 40%, mid D1 40%, mid circumflex 20-30%, distal RCA occluded with left to right collaterals.  . Benign prostatic hypertrophy   . TIA (transient ischemic attack) 10/2011    carotids neg for ICA stenosis  . Atrial fibrillation ([place])   . Skin cancer   . BPH (benign prostatic hyperplasia)   . Renal insufficiency     Renal failure in July 2012 secondary to hypotension, ARB use, and   . Esophageal stricture   . Hemorrhoids     Social History   Social History  . Marital Status: Married    Spouse Name: N/A  . Number of Children: N/A  . Years of Education: N/A   Occupational History  . retired    Social History Main Topics  . Smoking status: Former Smoker -- 2.00 packs/day for 30 years    Quit date: 06/14/1968  . Smokeless tobacco: Never Used  . Alcohol Use: No  . Drug Use: No  . Sexual Activity: Not Asked   Other Topics Concern  . None   Social History Narrative   Daily caffeine     ECHO--10/11/15 Study Conclusions  - Left ventricle: The cavity size was normal. Systolic function was  normal. The estimated ejection fraction was in the range of 15%  to 20%. Akinesis of the anteroseptal, anterior, and apical  myocardium. Hypokinesis of the inferior myocardium. - Mitral valve: There was moderate regurgitation. - Left atrium: The atrium was moderately dilated. - Right atrium: The atrium was mildly dilated. - Pericardium, extracardiac:  A trivial pericardial effusion was  identified.  Labs, prior tests, procedures, and surgery: Permanent pacemaker system implantation.  Transthoracic echocardiography. M-mode, complete 2D, spectral Doppler, and color Doppler. Birthdate: Patient birthdate: 1932/05/16. Age: Patient is 82 yr old. Sex: Gender: male. BMI: 24.7 kg/m^2. Blood pressure: 127/85 Patient status: Inpatient. Study date: Study date: 10/11/2015. Study time: 02:22 PM.  Location: Bedside.   BNP    Component Value Date/Time   BNP 685.2* 10/17/2015 0509    ProBNP    Component Value Date/Time   PROBNP 620.6* 12/03/2011 0457     Education Assessment and Provision:  Detailed education and instructions provided on heart failure disease management including the following:  Signs and symptoms of Heart Failure When to call the physician Importance of daily weights Low sodium diet Fluid restriction Medication management Anticipated future follow-up appointments  Patient education given on each of the above topics.  Patient acknowledges understanding and acceptance of all instructions.  I spoke briefly with patient regarding his HF and recommendations for home.  He says that he does weigh everyday and I reviewed when to call the physician related to his weight increases.  I briefly reviewed a low sodium diet and high sodium foods to avoid.  He denies any issues getting or taking prescribed medications.  He follows at [facility]   Education Materials:  "Living Better With Heart Failure" Booklet, Daily Weight Tracker Tool   High Risk Criteria for Readmission and/or Poor Patient Outcomes:   EF <30%- yes 15-20%  2 or more admissions in 6 months- No  Difficult social situation- No? Lives with daughter  Demonstrates medication noncompliance- Denies    Barriers of Care:  Knowledge and compliance  Discharge Planning:  Plans to return to home with daughter in [place].

## 2015-10-18 ENCOUNTER — Encounter (HOSPITAL_COMMUNITY): Payer: Self-pay | Admitting: Student

## 2015-10-21 ENCOUNTER — Other Ambulatory Visit: Payer: Self-pay | Admitting: *Deleted

## 2015-10-21 NOTE — Patient Outreach (Signed)
[facility]) Care Management  10/21/2015  Jacob [name] November 24, 1931 [id number]   Transition of care  RN spoke with pt and daughter and introduced the [facility] program and services. RN spoke with pt and confirmed identifiers. Due to pt having trouble speaking permission was granted to speak with his daughter (primary caregiver). Daughter states pt doing well with twice daily weights and reports weight today at 151 lbs and yesterday at 152 lbs. Denies pt having any swelling with no symptoms of distressful breathing. Briefly discussed HF and the importance of daily monitoring with weights to recognize any potential fluid retention (verbalized an understanding). RN also reviewed the HF zones with the importance stressed on fluid retention of 3 lbs overnight or 5 lbs within one week what to do if acute symptoms should occur. Caregiver has verified pt without swelling today and again doing well with his recovery. RN extended community for home visits to discuss in detail HF zones and provide printable information on HF (daughter receptive). RN scheduled the initial home visit for next Monday at 11:30am. Daughter in a rush for an appointment and did not have enough time to review all of the pt's medications but verified he is taking all his prescribed medications with no problems. RN unable to completed a plan of care however will do so on the Monday.   Raina Mina, RN Care Management Coordinator [place] Office [phone]

## 2015-10-23 ENCOUNTER — Telehealth (HOSPITAL_COMMUNITY): Payer: Self-pay | Admitting: Surgery

## 2015-10-23 ENCOUNTER — Ambulatory Visit: Payer: Self-pay | Admitting: *Deleted

## 2015-10-23 NOTE — Telephone Encounter (Signed)
Heart Failure Nurse Navigator Post Discharge Telephone Call  I called and spoke with Jacob [name] regarding his recent hospitalization.Marland Kitchen He tells me that he is doing "pretty good".  He says he has been weighing-however did not weigh today.  I reinforced the need for daily weights and when to call the physician.  He denies any increase in SOB or swelling.  He denies any issues with getting or taking his prescribed medications.   He has a follow-up appt at [facility] on May 19th.  I have encouraged  him to call me back with any issues or concerns with his HF.

## 2015-10-27 ENCOUNTER — Encounter: Payer: Self-pay | Admitting: *Deleted

## 2015-10-27 ENCOUNTER — Other Ambulatory Visit: Payer: Self-pay | Admitting: *Deleted

## 2015-10-27 VITALS — BP 118/72 | HR 91 | Resp 20 | Ht 67.0 in | Wt 150.0 lb

## 2015-10-27 DIAGNOSIS — I5022 Chronic systolic (congestive) heart failure: Secondary | ICD-10-CM

## 2015-10-27 NOTE — Patient Outreach (Addendum)
[facility]) Care Management   10/27/2015  Jacob [name] [birth year] [id number]  Jacob [name] is an 82 y.o. male Pt interested and receptive to [facility] services via initial home visit Subjective:  HF: Pt familiar with HF but not aware of the signs and symptoms. Pt indicates he weights but not documenting all readings for providers to view. Pt denies any swelling to his legs, hands or trunk area with no coughing and no weight gained once HF zones discussed today. Pt has indicated he is in the GREEN zone with no symptoms since his discharge.  TRANSPORTATION: Although daughter indicates they have transportation there has been a request for possible assistance with SCATs (will offer assistance via [place] worker). NUTRITION: Pt indicates his appetite is poor with some weight loss over the course of several months however pt reports he drinks 2-3 bottles of ENSURE daily when he does not eat while. Pt states "I just no hungry". Pt states he has not mentioned this to his provide but will do so on 5/16 with Dr. Quay Burow.   APPOINTMENTS: Daughter verifies current they have transportation to and from the pt's providers appointments and able to recite all upcoming appointments for May. Primary provider on 5/16 and Dr. Lovena Le (CAD) 5/19.  MEDICATIONS: Pt and daughter reports several medications that were on HOLD including his ASA that will be verified on his provider's appointment tomorrow.   Objective:   Review of Systems  Constitutional: Negative.   HENT: Negative.   Eyes: Negative.   Respiratory: Negative.   Cardiovascular: Negative.   Gastrointestinal: Negative.   Genitourinary: Negative.   Musculoskeletal: Negative.   Skin: Negative.   Endo/Heme/Allergies: Negative.   Psychiatric/Behavioral: Negative.     Physical Exam  Constitutional: He is oriented to person, place, and time. He appears well-developed and well-nourished.  HENT:  Right Ear: External ear normal.  Left Ear:  External ear normal.  Eyes: EOM are normal.  Neck: Normal range of motion.  Cardiovascular: Normal heart sounds.   Respiratory: Effort normal and breath sounds normal.  GI: Soft. Bowel sounds are normal.  Neurological: He is alert and oriented to person, place, and time.  Skin: Skin is warm and dry.  Psychiatric: He has a normal mood and affect. His behavior is normal. Judgment and thought content normal.    Encounter Medications:   Outpatient Encounter Prescriptions as of 10/27/2015  Medication Sig  . dabigatran (PRADAXA) 150 MG CAPS capsule Take 1 capsule (150 mg total) by mouth 2 (two) times daily.  . DULoxetine (CYMBALTA) 20 MG capsule Take 2 capsules (40 mg total) by mouth daily.  . finasteride (PROSCAR) 5 MG tablet Take 1 tablet (5 mg total) by mouth daily.  . fluticasone (FLONASE) 50 MCG/ACT nasal spray SHAKE LIQUID AND USE 2 SPRAYS IN EACH NOSTRIL DAILY AS NEEDED FOR ALLERGIES OR RHINITIS  . furosemide (LASIX) 40 MG tablet Take 1 tablet (40 mg total) by mouth daily.  . hydrALAZINE (APRESOLINE) 25 MG tablet Take 1.5 tablets (37.5 mg total) by mouth every 8 (eight) hours.  Marland Kitchen HYDROcodone-acetaminophen (NORCO) 7.5-325 MG tablet Take 1 tablet by mouth every 6 (six) hours as needed for moderate pain.  . isosorbide mononitrate (IMDUR) 30 MG 24 hr tablet Take 2 tablets (60 mg total) by mouth daily.  . metoprolol tartrate (LOPRESSOR) 25 MG tablet Take 1 tablet (25 mg total) by mouth 2 (two) times daily.  Marland Kitchen aspirin EC 81 MG EC tablet Take 1 tablet (81 mg total) by  mouth daily. (Patient not taking: Reported on 10/27/2015)  . NITROSTAT 0.4 MG SL tablet Place 0.4 mg under the tongue every 5 (five) minutes as needed. Reported on 10/27/2015  . omeprazole (PRILOSEC) 40 MG capsule Take 1 capsule (40 mg total) by mouth daily. (Patient not taking: Reported on 10/27/2015)  . ranitidine (ZANTAC) 300 MG tablet Take 1 tablet (300 mg total) by mouth at bedtime. (Patient not taking: Reported on 10/27/2015)  .  rosuvastatin (CRESTOR) 10 MG tablet Take 1 tablet (10 mg total) by mouth at bedtime. (Patient not taking: Reported on 10/27/2015)  . solifenacin (VESICARE) 5 MG tablet Take 5 mg by mouth daily. Reported on 10/27/2015   No facility-administered encounter medications on file as of 10/27/2015.    Functional Status:   In your present state of health, do you have any difficulty performing the following activities: 10/27/2015 10/11/2015  Hearing? N -  Vision? N -  Difficulty concentrating or making decisions? Y -  Walking or climbing stairs? Y -  Dressing or bathing? N -  Doing errands, shopping? Tempie Donning  Preparing Food and eating ? N -  Using the Toilet? N -  In the past six months, have you accidently leaked urine? Y -  Do you have problems with loss of bowel control? N -  Managing your Medications? N -  Managing your Finances? Y -  Housekeeping or managing your Housekeeping? Y -  BP 118/72 mmHg  Pulse 91  Resp 20  Ht 1.702 m (5\' 7" )  Wt 150 lb (68.04 kg)  BMI 23.49 kg/m2  SpO2 98%  Fall/Depression Screening:    PHQ 2/9 Scores 10/27/2015  PHQ - 2 Score 0    Assessment:  Introduced [facility] program and services Case management related to HF Transportation issues related to referral for [facility] social worker Nutrition needs related to poor appetite Adherence related medical appointment Medication adherence related to his recent discharged  Plan:  Will verify signs consent and completed a physical assessment. Will verify pt's understanding of HF and with available teaching and provided printable EMMI educational material ( HF: Keeping track of his daily weights and HF: When to call his doctor or 911) and review for increase knowledge base on pt's HF. Will educate on the HF zones and verify pt is in the GREEN zone today with no encountered problems. Will strongly encouraged pt to weight daily in monitoring his HF and document all weights in his [facility] calendar that has been provided. Review the calendar  with noted resources for transportation, food and social services if needed.  Will inquired on transportation to his upcoming providers appointments. Issues verified and RN offered a referral via [facility] social worker for SCATs application for transportation assistance. Will discuss pt's eating habits and strongly encouraged pt on dietary maintenance concerning his ongoing recovery and prevention measures from acute issues that maybe effect by his lack of nutrition. Will strongly encouraged pt to continue consuming his nutritional supplements when he does not complete a full meal. Verified pt with good tolerance and able to complete 2-3 daily however stress the imporatnce of completing his meals. Will discussed the risk of increase illness with lack of felled nutritional needs. Will strongly encourage to attend all medical appointments and offered a referral for SCAT via [facility] social worker to assist (pt agreed).  Will verified pt's medications and education accordingly on all his medications. Will strongly encouraged pt to take all medications to his provider's appointment tomorrow to verify his daily medications.  Plan of care generated along with goals in plan and reviewed with pt who has agreed with these goals. Will re-evaluate on next home visit and scheduled next month's appointment accordingly.  Raina Mina, RN Care Management Coordinator [place] Office [phone]

## 2015-10-28 ENCOUNTER — Other Ambulatory Visit: Payer: Self-pay | Admitting: Family

## 2015-10-28 ENCOUNTER — Encounter: Payer: Self-pay | Admitting: Family

## 2015-10-28 ENCOUNTER — Ambulatory Visit (INDEPENDENT_AMBULATORY_CARE_PROVIDER_SITE_OTHER): Payer: Medicare Other | Admitting: Family

## 2015-10-28 VITALS — BP 120/70 | HR 75 | Temp 97.4°F | Resp 16 | Ht 68.0 in | Wt 156.0 lb

## 2015-10-28 DIAGNOSIS — Z7189 Other specified counseling: Secondary | ICD-10-CM | POA: Diagnosis not present

## 2015-10-28 DIAGNOSIS — I255 Ischemic cardiomyopathy: Secondary | ICD-10-CM

## 2015-10-28 DIAGNOSIS — I5043 Acute on chronic combined systolic (congestive) and diastolic (congestive) heart failure: Secondary | ICD-10-CM | POA: Diagnosis not present

## 2015-10-28 DIAGNOSIS — I2510 Atherosclerotic heart disease of native coronary artery without angina pectoris: Secondary | ICD-10-CM | POA: Diagnosis not present

## 2015-10-28 DIAGNOSIS — I4891 Unspecified atrial fibrillation: Secondary | ICD-10-CM | POA: Diagnosis not present

## 2015-10-28 MED ORDER — ISOSORBIDE MONONITRATE ER 30 MG PO TB24: 60.0000 mg | ORAL_TABLET | Freq: Every day | ORAL | Status: DC

## 2015-10-28 NOTE — Patient Instructions (Signed)
Thank you for choosing [facility].  Summary/Instructions:  Please continue to take your medications as prescribed.  Continue to increase your activity as tolerated.  Eat small, frequent meals.  Follow up with cardiology as scheduled.  Your prescription(s) have been submitted to your pharmacy or been printed and provided for you. Please take as directed and contact our office if you believe you are having problem(s) with the medication(s) or have any questions.  If your symptoms worsen or fail to improve, please contact our office for further instruction, or in case of emergency go directly to the emergency room at the closest medical facility.

## 2015-10-28 NOTE — Progress Notes (Addendum)
Subjective:    Patient ID: Jacob [name], male    DOB: Oct 30, 1931, 82 y.o.   MRN: [id number]  Chief Complaint  Patient presents with  . Hospitalization Follow-up    HPI:  Jacob [name] is a 82 y.o. male who  has a past medical history of Chronic systolic heart failure ([place]); HTN (hypertension); History of diverticulitis of colon; GERD (gastroesophageal reflux disease); History of nephrolithiasis; Ischemic cardiomyopathy; Urinary retention; Arthritis; HLD (hyperlipidemia); Coronary artery disease; Benign prostatic hypertrophy; TIA (transient ischemic attack) (10/2011); Atrial fibrillation ([place]); Skin cancer; BPH (benign prostatic hyperplasia); Renal insufficiency; Esophageal stricture; and Hemorrhoids. and presents today for a hospitalization follow up.   Recently evaluated in the emergency department and admitted to the hospital with concern for shortness of breath and cough over several week time. With clear productive sputum prior to arrival. Noted there is some increased dyspnea with laying down without chest pain. Physical exam with JVD and tachycardia noted. There was trace edema in his bilateral lower extremities. EKG with supraventricular tachycardia and suspected atrial fibrillation with RVR. He was given a bolus of 15 mg of diltiazem started on a drip of 5 per hour. Heart rate gradually decreased to the 80s with a regular rhythm on the monitor consistent with atrial fibrillation. Chest x-ray consistent with edema combined with JVD and elevated BNP concerning for exacerbation of congestive heart failure. He was diuresis 40 mg of IV Lasix. Cardiology was consult and relation to an EKG with changes to the anterior lateral ST depression and recent echocardiogram showing new anterior wall akinesis and ejection fraction of 15-20%. Troponins were initially mildly elevated. He received a left and right heart cath on 10/14/15 which showed a stable mid RCA lesion along with a distal RCA lesion. Injection  fraction was confirmed with approximate 10-15%. Coronary artery disease is felt to be relatively stable with normal right heart pressures. Reduced ejection fraction most likely related to atrial fibrillation in the setting of ischemic cardiomyopathy. He was recommended to DC his Lasix and restart by mouth Lasix along with Pradaxa. He was down 1.5 L upon discharge. His BNP improved from [phone] during admission. With Lasix dose ultimately set at 40 mg daily at discharge. All hospital records, imaging, and labs were reviewed in detail.  Since leaving the hospital he reports that he has been doing pretty good. Denies any additional episodes of shortness of breath or chest pain. Reports taking his medications as prescribed without adverse side effects.  Continues to experience some difficulty with his medications and has brought several of them with him today for review. Reports that he is working on improving his stamina and completing his activities of daily living. Family reports that his eating has improved some but still eating minimally. Has a cardiology follow-up on 10/31/15.   Allergies  Allergen Reactions  . Bactrim [Sulfamethoxazole-Trimethoprim] Other (See Comments)    Throat swelling  . Penicillins Itching, Swelling and Rash     Current Outpatient Prescriptions on File Prior to Visit  Medication Sig Dispense Refill  . aspirin EC 81 MG EC tablet Take 1 tablet (81 mg total) by mouth daily.    . dabigatran (PRADAXA) 150 MG CAPS capsule Take 1 capsule (150 mg total) by mouth 2 (two) times daily. 180 capsule 1  . DULoxetine (CYMBALTA) 20 MG capsule Take 2 capsules (40 mg total) by mouth daily. 180 capsule 0  . finasteride (PROSCAR) 5 MG tablet Take 1 tablet (5 mg total) by mouth daily. 90 tablet  1  . fluticasone (FLONASE) 50 MCG/ACT nasal spray SHAKE LIQUID AND USE 2 SPRAYS IN EACH NOSTRIL DAILY AS NEEDED FOR ALLERGIES OR RHINITIS 16 g 1  . furosemide (LASIX) 40 MG tablet Take 1 tablet (40 mg  total) by mouth daily. 30 tablet 6  . hydrALAZINE (APRESOLINE) 25 MG tablet Take 1.5 tablets (37.5 mg total) by mouth every 8 (eight) hours. 120 tablet 6  . HYDROcodone-acetaminophen (NORCO) 7.5-325 MG tablet Take 1 tablet by mouth every 6 (six) hours as needed for moderate pain.    . metoprolol tartrate (LOPRESSOR) 25 MG tablet Take 1 tablet (25 mg total) by mouth 2 (two) times daily. 60 tablet 6  . NITROSTAT 0.4 MG SL tablet Place 0.4 mg under the tongue every 5 (five) minutes as needed. Reported on 10/27/2015    . omeprazole (PRILOSEC) 40 MG capsule Take 1 capsule (40 mg total) by mouth daily. 30 capsule 5  . ranitidine (ZANTAC) 300 MG tablet Take 1 tablet (300 mg total) by mouth at bedtime. 90 tablet 1  . rosuvastatin (CRESTOR) 10 MG tablet Take 1 tablet (10 mg total) by mouth at bedtime. 30 tablet 6  . solifenacin (VESICARE) 5 MG tablet Take 5 mg by mouth daily. Reported on 10/27/2015     No current facility-administered medications on file prior to visit.     Past Surgical History  Procedure Laterality Date  . Cardiac defibrillator placement  12/2006    AICD  . Total knee arthroplasty  01/04/11    L TKA  . Cardiac catheterization  11-05-10  . Joint replacement    . Cataracts removed    . Transurethral resection of prostate  06/14/2011    Procedure: TRANSURETHRAL RESECTION OF THE PROSTATE (TURP);  Surgeon: Bernestine Amass, MD;  Location: [facility] ORS;  Service: Urology;  Laterality: N/A;  . Esophagogastroduodenoscopy N/A 01/24/2014    Procedure: ESOPHAGOGASTRODUODENOSCOPY (EGD);  Surgeon: Milus Banister, MD;  Location: [facility];  Service: Endoscopy;  Laterality: N/A;  . Cardiac catheterization N/A 10/14/2015    Procedure: Right/Left Heart Cath and Coronary Angiography;  Surgeon: Leonie Man, MD;  Location: [facility] LAB;  Service: Cardiovascular;  Laterality: N/A;    Past Medical History  Diagnosis Date  . Chronic systolic heart failure (HCC)     a. EF 25-30% in past; b.Echo  11/08/11: Normal LV wall thickness, EF 45-50%. c. Echo 04/17: EF 15-20%, diffuse akinesis, moderate MR, mod dilated LA, trivial PE   . HTN (hypertension)   . History of diverticulitis of colon   . GERD (gastroesophageal reflux disease)   . History of nephrolithiasis   . Ischemic cardiomyopathy     a. s/p ICD 2008   . Urinary retention     foley indwelling 12/2010-04/2011  . Arthritis   . HLD (hyperlipidemia)   . Coronary artery disease     a. Chronically occ RCA. cardiac cath 10/2010: Mid LAD 20-30%, distal LAD 40%, mid D1 40%, mid circumflex 20-30%, distal RCA occluded with left to right collaterals. c. 10/2015: cath- relatively stable dz since 2012.    Marland Kitchen Benign prostatic hypertrophy   . TIA (transient ischemic attack) 10/2011    carotids neg for ICA stenosis  . Atrial fibrillation ([place])   . Skin cancer   . BPH (benign prostatic hyperplasia)   . Renal insufficiency     Renal failure in July 2012 secondary to hypotension, ARB use, and   . Esophageal stricture   . Hemorrhoids  Review of Systems  Constitutional: Positive for fatigue. Negative for fever and chills.  Respiratory: Negative for chest tightness, shortness of breath and wheezing.   Cardiovascular: Negative for chest pain, palpitations and leg swelling.  Neurological: Negative for dizziness and weakness.      Objective:    BP 120/70 mmHg  Pulse 75  Temp(Src) 97.4 F (36.3 C) (Oral)  Resp 16  Ht 5\' 8"  (1.727 m)  Wt 156 lb (70.761 kg)  BMI 23.73 kg/m2  SpO2 97% Nursing note and vital signs reviewed.  Physical Exam  Constitutional: He is oriented to person, place, and time. He appears well-developed and well-nourished. No distress.  Seated in the chair with a cane located in front of him. Neatly dressed and answers questions appropriately.   Cardiovascular: Normal rate, regular rhythm, normal heart sounds and intact distal pulses.   Pulmonary/Chest: Effort normal and breath sounds normal.  Neurological: He is  alert and oriented to person, place, and time.  Skin: Skin is warm and dry.  Psychiatric: He has a normal mood and affect. His behavior is normal. Judgment and thought content normal.       Assessment & Plan:   Problem List Items Addressed This Visit      Cardiovascular and Mediastinum   Acute on chronic combined systolic and diastolic congestive heart failure ([place]) - Primary    Most recent echocardiogram with ejection fraction of approximate 10-15% and appears euvolemic with no shortness of breath at rest. Appears NYHA Class II. Stable with current medication regimen. On metoprolol for heart failure and rate control of atrial fibrillation. Encouraged continued monitoring of weights at home. Follow up with cardiology as scheduled.       Relevant Medications   isosorbide mononitrate (IMDUR) 30 MG 24 hr tablet   Atrial fibrillation with RVR (HCC)    Atrial fibrillation appears without rapid ventricular response and well controlled with current dosage of metoprolol. Continue current dosage of metoprolol. Continue anticoagulation.      Relevant Medications   isosorbide mononitrate (IMDUR) 30 MG 24 hr tablet   Coronary artery disease involving native heart    Per cardiology notes coronary artery disease appears stable. Continue to monitor through management of risk factors for coronary artery disease including hypertension and hyperlipidemia.      Relevant Medications   isosorbide mononitrate (IMDUR) 30 MG 24 hr tablet    Other Visit Diagnoses    Encounter for medication review and counseling            I am having Mr. Steer maintain his solifenacin, NITROSTAT, HYDROcodone-acetaminophen, finasteride, dabigatran, fluticasone, omeprazole, DULoxetine, ranitidine, aspirin, furosemide, hydrALAZINE, metoprolol tartrate, rosuvastatin, and isosorbide mononitrate.   Meds ordered this encounter  Medications  . isosorbide mononitrate (IMDUR) 30 MG 24 hr tablet    Sig: Take 2 tablets (60 mg  total) by mouth daily.    Dispense:  60 tablet    Refill:  1     Follow-up: As scheduled with cardiology and PCP.   Mauricio Po, FNP

## 2015-10-28 NOTE — Assessment & Plan Note (Signed)
Per cardiology notes coronary artery disease appears stable. Continue to monitor through management of risk factors for coronary artery disease including hypertension and hyperlipidemia.

## 2015-10-28 NOTE — Progress Notes (Signed)
Pre visit review using our clinic review tool, if applicable. No additional management support is needed unless otherwise documented below in the visit note. 

## 2015-10-28 NOTE — Assessment & Plan Note (Signed)
Atrial fibrillation appears without rapid ventricular response and well controlled with current dosage of metoprolol. Continue current dosage of metoprolol. Continue anticoagulation.

## 2015-10-28 NOTE — Assessment & Plan Note (Signed)
Most recent echocardiogram with ejection fraction of approximate 10-15% and appears euvolemic with no shortness of breath at rest. Appears NYHA Class II. Stable with current medication regimen. On metoprolol for heart failure and rate control of atrial fibrillation. Encouraged continued monitoring of weights at home. Follow up with cardiology as scheduled.

## 2015-10-29 ENCOUNTER — Encounter: Payer: Self-pay | Admitting: *Deleted

## 2015-10-31 ENCOUNTER — Ambulatory Visit (INDEPENDENT_AMBULATORY_CARE_PROVIDER_SITE_OTHER): Payer: Medicare Other | Admitting: Internal Medicine

## 2015-10-31 ENCOUNTER — Encounter: Payer: Self-pay | Admitting: Internal Medicine

## 2015-10-31 VITALS — BP 128/78 | HR 77 | Ht 67.5 in | Wt 159.4 lb

## 2015-10-31 DIAGNOSIS — I483 Typical atrial flutter: Secondary | ICD-10-CM | POA: Diagnosis not present

## 2015-10-31 DIAGNOSIS — I255 Ischemic cardiomyopathy: Secondary | ICD-10-CM

## 2015-10-31 LAB — CUP PACEART INCLINIC DEVICE CHECK
Implantable Lead Implant Date: 20080716
Implantable Lead Location: 753859
Implantable Lead Location: 753860
Implantable Lead Model: 158
Implantable Lead Serial Number: 174941
Implantable Lead Serial Number: 281722
MDC IDC LEAD IMPLANT DT: 20080716
MDC IDC PG SERIAL: 504434
MDC IDC SESS DTM: 20170519171947
MDC IDC SET LEADCHNL RV PACING AMPLITUDE: 2.4 V
MDC IDC SET LEADCHNL RV PACING PULSEWIDTH: 0.4 ms

## 2015-10-31 NOTE — Progress Notes (Signed)
HPI Mr. Battaglia returns today for followup. He is a very pleasant 82 year old man with an ischemic cardiomyopathy, chronic systolic heart failure class II, ventricular tachycardia, and atrial fibrillation, status post ICD implantation. The patient has been well-controlled from his atrial fibrillation on medical therapy and he denies chest pain, or shortness of breath. No peripheral edema. He does admit to some sedentary lifestyle. He has reached ERI. He was hospitalized with worsening CHF and has been diuresed and feels much better.  Allergies  Allergen Reactions  . Bactrim [Sulfamethoxazole-Trimethoprim] Other (See Comments)    Throat swelling  . Penicillins Itching, Swelling and Rash     Current Outpatient Prescriptions  Medication Sig Dispense Refill  . aspirin EC 81 MG EC tablet Take 1 tablet (81 mg total) by mouth daily.    . dabigatran (PRADAXA) 150 MG CAPS capsule Take 1 capsule (150 mg total) by mouth 2 (two) times daily. 180 capsule 1  . DULoxetine (CYMBALTA) 20 MG capsule Take 2 capsules (40 mg total) by mouth daily. 180 capsule 0  . finasteride (PROSCAR) 5 MG tablet Take 1 tablet (5 mg total) by mouth daily. 90 tablet 1  . fluticasone (FLONASE) 50 MCG/ACT nasal spray SHAKE LIQUID AND USE 2 SPRAYS IN EACH NOSTRIL DAILY AS NEEDED FOR ALLERGIES OR RHINITIS 16 g 1  . furosemide (LASIX) 40 MG tablet Take 1 tablet (40 mg total) by mouth daily. 30 tablet 6  . hydrALAZINE (APRESOLINE) 25 MG tablet Take 1.5 tablets (37.5 mg total) by mouth every 8 (eight) hours. 120 tablet 6  . HYDROcodone-acetaminophen (NORCO) 7.5-325 MG tablet Take 1 tablet by mouth every 6 (six) hours as needed for moderate pain.    . isosorbide mononitrate (IMDUR) 30 MG 24 hr tablet Take 2 tablets (60 mg total) by mouth daily. 60 tablet 1  . metoprolol tartrate (LOPRESSOR) 25 MG tablet Take 1 tablet (25 mg total) by mouth 2 (two) times daily. 60 tablet 6  . NITROSTAT 0.4 MG SL tablet Place 0.4 mg under the tongue every 5  (five) minutes as needed. Reported on 10/27/2015    . omeprazole (PRILOSEC) 40 MG capsule Take 1 capsule (40 mg total) by mouth daily. 30 capsule 5  . ranitidine (ZANTAC) 300 MG tablet Take 1 tablet (300 mg total) by mouth at bedtime. 90 tablet 1  . rosuvastatin (CRESTOR) 10 MG tablet Take 1 tablet (10 mg total) by mouth at bedtime. 30 tablet 6  . solifenacin (VESICARE) 5 MG tablet Take 5 mg by mouth daily. Reported on 10/27/2015     No current facility-administered medications for this visit.     Past Medical History  Diagnosis Date  . Chronic systolic heart failure (HCC)     a. EF 25-30% in past; b.Echo 11/08/11: Normal LV wall thickness, EF 45-50%. c. Echo 04/17: EF 15-20%, diffuse akinesis, moderate MR, mod dilated LA, trivial PE   . HTN (hypertension)   . History of diverticulitis of colon   . GERD (gastroesophageal reflux disease)   . History of nephrolithiasis   . Ischemic cardiomyopathy     a. s/p ICD 2008   . Urinary retention     foley indwelling 12/2010-04/2011  . Arthritis   . HLD (hyperlipidemia)   . Coronary artery disease     a. Chronically occ RCA. cardiac cath 10/2010: Mid LAD 20-30%, distal LAD 40%, mid D1 40%, mid circumflex 20-30%, distal RCA occluded with left to right collaterals. c. 10/2015: cath- relatively stable dz since 2012.    Marland Kitchen  Benign prostatic hypertrophy   . TIA (transient ischemic attack) 10/2011    carotids neg for ICA stenosis  . Atrial fibrillation ([place])   . Skin cancer   . BPH (benign prostatic hyperplasia)   . Renal insufficiency     Renal failure in July 2012 secondary to hypotension, ARB use, and   . Esophageal stricture   . Hemorrhoids     ROS:   All systems reviewed and negative except as noted in the HPI.   Past Surgical History  Procedure Laterality Date  . Cardiac defibrillator placement  12/2006    AICD  . Total knee arthroplasty  01/04/11    L TKA  . Cardiac catheterization  11-05-10  . Joint replacement    . Cataracts removed     . Transurethral resection of prostate  06/14/2011    Procedure: TRANSURETHRAL RESECTION OF THE PROSTATE (TURP);  Surgeon: Bernestine Amass, MD;  Location: [facility];  Service: Urology;  Laterality: N/A;  . Esophagogastroduodenoscopy N/A 01/24/2014    Procedure: ESOPHAGOGASTRODUODENOSCOPY (EGD);  Surgeon: Milus Banister, MD;  Location: [facility];  Service: Endoscopy;  Laterality: N/A;  . Cardiac catheterization N/A 10/14/2015    Procedure: Right/Left Heart Cath and Coronary Angiography;  Surgeon: Leonie Man, MD;  Location: [facility];  Service: Cardiovascular;  Laterality: N/A;     Family History  Problem Relation Age of Onset  . Heart disease Son     x 2  . Colon cancer Neg Hx   . Heart attack Mother 52    pt unsure of age  . Liver disease Father 40     Social History   Social History  . Marital Status: Married    Spouse Name: N/A  . Number of Children: N/A  . Years of Education: N/A   Occupational History  . retired    Social History Main Topics  . Smoking status: Former Smoker -- 2.00 packs/day for 30 years    Quit date: 06/14/1968  . Smokeless tobacco: Never Used  . Alcohol Use: No  . Drug Use: No  . Sexual Activity: Not on file   Other Topics Concern  . Not on file   Social History Narrative   Daily caffeine      BP 128/78 mmHg  Pulse 77  Ht 5' 7.5" (1.715 m)  Wt 159 lb 6.4 oz (72.303 kg)  BMI 24.58 kg/m2  Physical Exam:  Well appearing 82 year old man, NAD HEENT: Unremarkable Neck:  6 cm JVD, no thyromegally Lungs:  Clear except for scattered basilar rales, no wheezes, no rhonchi. HEART:  IRegular rate rhythm, no murmurs, no rubs, no clicks Abd:  soft, positive bowel sounds, no organomegally, no rebound, no guarding Ext:  2 plus pulses, no edema, no cyanosis, no clubbing Skin:  No rashes no nodules Neuro:  CN II through XII intact, motor grossly intact  ECG - atrial flutter with a controlled VR  DEVICE  Normal device function.  See  PaceArt for details.   Assess/Plan: 1. Chronic systolic heart failure - his symptoms are well compensated. His EF by echo is 20%. Will plan to proceed with ICD generator change out. 2. CAD - he denies anginal symptoms. Will follow.  3. Atrial flutter - he has had atrial fib in the past. He is well rate controlled presently 4. ICD - his [place] device is at [facility]. Will schedule generator change out.  Gregg Taylor,M.D.  Mikle Bosworth.D.

## 2015-10-31 NOTE — Patient Instructions (Addendum)
Medication Instructions:  Your physician recommends that you continue on your current medications as directed. Please refer to the Current Medication list given to you today.   Labwork: None ordered  labs will be done at the hospital   Testing/Procedures: ICD generator change on 11/11/15 at 11:30am Please arrive at the [place] [facility] at 9:30am Do not eat or drink after midnight the night before your procedure Ok to take medications with a small sip of water the morning of the procedure--HOLD Furosemide   Follow-Up: Your physician recommends that you schedule a follow-up appointment in: 10-14 days from 11/11/15 in device clinic for wound check and 3 months from 11/01/15 with Dr Lovena Le   Any Other Special Instructions Will Be Listed Below (If Applicable).     If you need a refill on your cardiac medications before your next appointment, please call your pharmacy.

## 2015-11-03 ENCOUNTER — Telehealth: Payer: Self-pay | Admitting: Internal Medicine

## 2015-11-03 ENCOUNTER — Other Ambulatory Visit: Payer: Self-pay | Admitting: *Deleted

## 2015-11-03 ENCOUNTER — Encounter: Payer: Self-pay | Admitting: *Deleted

## 2015-11-03 NOTE — Telephone Encounter (Signed)
New [place]aage  Pt daughter called request a call back to discuss details ( place, time, etc)  of CATH/ Defib replacement.

## 2015-11-03 NOTE — Patient Outreach (Signed)
[facility]) Care Management  11/03/2015  Jacob [name] 10/09/1931 [id number]   Transition of care  RN spoke with primary caregiver daughter (Jacob [name]-consented) who speaks with pt in the background concerning how pt was doing today. Pt reports he is doing well with swelling to his legs, hands or trunk area and breathing with no distress. Daughter able to recite pt's weight for today at 151 lbs, yesterday 152 lbs and last week 152 lbs with no acute events or issues mentioned.  Daughter Jacob [name] indicates pt currently pending a surgical procedure toe change out his pacemaker in the next month. Reiterated on the HF zones and what to do if acute symptoms should occur as pt current pending change out on his pacer. Further inquired on pt's medical history and confirmed pt remains in the GREEN zone today with no acute episodes.  Will schedule the next telephone call for next week for ongoing transition of care.  Jacob Mina, RN Care Management Coordinator [place] Office [phone]

## 2015-11-03 NOTE — Telephone Encounter (Signed)
Re-routing message.

## 2015-11-03 NOTE — Telephone Encounter (Signed)
ICD generator change on 11/11/15 at 11:30am Please arrive at the [place] [facility] at 9:30am Do not eat or drink after midnight the night before your procedure Ok to take medications with a small sip of water the morning of the procedure--HOLD Furosemide

## 2015-11-05 ENCOUNTER — Other Ambulatory Visit: Payer: Self-pay | Admitting: *Deleted

## 2015-11-05 ENCOUNTER — Encounter: Payer: Self-pay | Admitting: *Deleted

## 2015-11-05 NOTE — Patient Outreach (Signed)
[facility] [facility]) Care Management  11/05/2015  Jacob [name] [birth year] [id number]  CSW received a new referral on patient from patient's [name] with [facility], Jacob [name] reporting that patient would benefit from social work services and resources to assist with transportation to and from physician appointments.  Patient is currently being transported by his son, Jacob Drakem but reports that he is not always available and/or reliable.  Patient is no longer able to drive, nor is his wife, Jacob [name]. CSW was able to make initial contact with patient today to perform phone assessment, as well as assess and assist with social work needs and services.  CSW introduced self, explained role and types [name] services provided through [facility] ([facility] [facility]).  CSW further explained to patient that CSW works with patient's [name], also with [facility], Jacob [name]. CSW then explained the reason for the call, indicating that Jacob [name] thought that patient would benefit from social work services and resources to assist with obtaining transportation to and from physician appointments.  CSW obtained two HIPAA compliant identifiers from patient, which included patient's name and date [name] birth. Patient admits to needs transportation and is interested in receiving services through [employer] [facility]).  Patient further admits to receiving the SCAT application that CSW mailed to patient's home.  Patient reported that his son, wife or caregiver is able to assist with completion, not needing CSW to "schedule a special visit".  CSW had a hard time understanding patient and CSW is aware that patient is quite hard [name] hearing.  No additional social work needs have been identified at this time. CSW will perform a case closure on patient, as all goals [name] treatment have been met from social work standpoint  and no additional social work needs have been identified at this time. CSW will notify patient's [name] with [facility] [facility], Jacob [name] CSW's plans to close patient's case. CSW will fax a correspondence letter to patient's Primary Care Physician, Jacob [name] to ensure that Dr. Quay [name] is aware [name] CSW's case closure plans.   CSW will submit a case closure request to Lurline Del, Care Management Assistant with [place], in the form [name] an [employer].  CSW will ensure that Mrs. Laurance Flatten is aware [name] Jacob [name], [name] with [facility], continued involvement with patient's care. Nat Christen, BSW, MSW, LCSW  Licensed Education officer, environmental Health System  Mailing [place] [address] [place], [place] [address] Physical Address-[address], [place], [place] [address] Toll Free Main # [phone] Fax # [phone] Cell # [phone]  Fax # [phone]  [email or web address] Patient's preferred language:  [place]   English  ATTENTION:  If you speak [place], language assistance services, free [name] charge, are available to you.    Nondiscrimination and Accessibility Statement: Discrimination is Against the [employer], a subsidiary [name] [employer], complies with [employer] civil rights laws and does not discriminate on the basis [name] race, color, national origin, age, disability, or sex.  [place] does not exclude people or treat them differently because [name] race, color, national origin, age, disability, or sex.  [facility] Providers will:  . Provide free aids and services to people with disabilities to communicate effectively with [place], such as:     ? Qualified sign language interpreters  ?  Written information in other formats (large print, audio, accessible electronic formats, other  formats)   . Provide free language services to people whose primary language is not [place], such as:    ? Qualified interpreters    ? Information written in other languages   If you need these services, contact your [facility] Fore[facility] psychologist.  If you believe that a [employer] [employer] has failed to provide these services or discriminated in another way on the basis [name] race, color, national origin, age, disability, or sex, you can file a Tourist information centre manager with: [place], [phone] or [email or web address].info/.  You can file a grievance in person or by mail, fax, or email. If you need help filing a grievance, you may contact Valrie Hart, Interim Compliance Officer, [facility] Department [name] Compliance and Integrity, [place], [place], [place]. [place], [phone], [email or web address].    You can also file a civil rights complaint with the U.S. Department [name] Health and Financial controller, Office for [employer], electronically through the Office for Civil Rights Complaint Portal, available at OnSiteLending.nl.jsf, or by mail or phone at:  [place]. Department [name] Health and Human Services [address], [place] Room [phone], [facility] Building [place], [place]  [phone], [phone] (TDD) Complaint forms are available at CutFunds.[email or web address].

## 2015-11-11 ENCOUNTER — Ambulatory Visit: Payer: Self-pay | Admitting: *Deleted

## 2015-11-11 ENCOUNTER — Encounter (HOSPITAL_COMMUNITY)
Admission: RE | Disposition: A | Discharge status: Home / Self Care | Payer: Self-pay | Source: Ambulatory Visit | Attending: Internal Medicine

## 2015-11-11 ENCOUNTER — Ambulatory Visit (HOSPITAL_COMMUNITY)
Admission: RE | Admit: 2015-11-11 | Discharge: 2015-11-11 | Disposition: A | Discharge status: Home / Self Care | Payer: Medicare Other | Source: Ambulatory Visit | Attending: Internal Medicine | Admitting: Internal Medicine

## 2015-11-11 DIAGNOSIS — Z8673 Personal history of transient ischemic attack (TIA), and cerebral infarction without residual deficits: Secondary | ICD-10-CM | POA: Insufficient documentation

## 2015-11-11 DIAGNOSIS — E785 Hyperlipidemia, unspecified: Secondary | ICD-10-CM | POA: Diagnosis not present

## 2015-11-11 DIAGNOSIS — I4891 Unspecified atrial fibrillation: Secondary | ICD-10-CM | POA: Insufficient documentation

## 2015-11-11 DIAGNOSIS — Z85828 Personal history of other malignant neoplasm of skin: Secondary | ICD-10-CM | POA: Insufficient documentation

## 2015-11-11 DIAGNOSIS — Z7982 Long term (current) use of aspirin: Secondary | ICD-10-CM | POA: Insufficient documentation

## 2015-11-11 DIAGNOSIS — Z8249 Family history of ischemic heart disease and other diseases of the circulatory system: Secondary | ICD-10-CM | POA: Diagnosis not present

## 2015-11-11 DIAGNOSIS — I472 Ventricular tachycardia: Secondary | ICD-10-CM | POA: Diagnosis not present

## 2015-11-11 DIAGNOSIS — I255 Ischemic cardiomyopathy: Secondary | ICD-10-CM | POA: Diagnosis not present

## 2015-11-11 DIAGNOSIS — Z87442 Personal history of urinary calculi: Secondary | ICD-10-CM | POA: Insufficient documentation

## 2015-11-11 DIAGNOSIS — I5022 Chronic systolic (congestive) heart failure: Secondary | ICD-10-CM | POA: Diagnosis not present

## 2015-11-11 DIAGNOSIS — Z4502 Encounter for adjustment and management of automatic implantable cardiac defibrillator: Secondary | ICD-10-CM | POA: Insufficient documentation

## 2015-11-11 DIAGNOSIS — N400 Enlarged prostate without lower urinary tract symptoms: Secondary | ICD-10-CM | POA: Diagnosis not present

## 2015-11-11 DIAGNOSIS — I4892 Unspecified atrial flutter: Secondary | ICD-10-CM | POA: Insufficient documentation

## 2015-11-11 DIAGNOSIS — Z7901 Long term (current) use of anticoagulants: Secondary | ICD-10-CM | POA: Insufficient documentation

## 2015-11-11 DIAGNOSIS — I2510 Atherosclerotic heart disease of native coronary artery without angina pectoris: Secondary | ICD-10-CM | POA: Insufficient documentation

## 2015-11-11 DIAGNOSIS — I5041 Acute combined systolic (congestive) and diastolic (congestive) heart failure: Secondary | ICD-10-CM | POA: Diagnosis present

## 2015-11-11 DIAGNOSIS — Z880 Allergy status to penicillin: Secondary | ICD-10-CM | POA: Diagnosis not present

## 2015-11-11 DIAGNOSIS — K219 Gastro-esophageal reflux disease without esophagitis: Secondary | ICD-10-CM | POA: Diagnosis not present

## 2015-11-11 DIAGNOSIS — Z87891 Personal history of nicotine dependence: Secondary | ICD-10-CM | POA: Diagnosis not present

## 2015-11-11 DIAGNOSIS — M1990 Unspecified osteoarthritis, unspecified site: Secondary | ICD-10-CM | POA: Insufficient documentation

## 2015-11-11 DIAGNOSIS — I110 Hypertensive heart disease with heart failure: Secondary | ICD-10-CM | POA: Diagnosis not present

## 2015-11-11 DIAGNOSIS — Z95810 Presence of automatic (implantable) cardiac defibrillator: Secondary | ICD-10-CM | POA: Diagnosis present

## 2015-11-11 HISTORY — PX: EP IMPLANTABLE DEVICE: SHX172B

## 2015-11-11 LAB — BASIC METABOLIC PANEL
Anion gap: 6 (ref 5–15)
BUN: 14 mg/dL (ref 6–20)
CALCIUM: 9 mg/dL (ref 8.9–10.3)
CHLORIDE: 109 mmol/L (ref 101–111)
CO2: 25 mmol/L (ref 22–32)
CREATININE: 1.4 mg/dL — AB (ref 0.61–1.24)
GFR calc non Af Amer: 45 mL/min — ABNORMAL LOW (ref >60)
GFR, EST AFRICAN AMERICAN: 52 mL/min — AB (ref >60)
GLUCOSE: 91 mg/dL (ref 65–99)
Potassium: 3.6 mmol/L (ref 3.5–5.1)
Sodium: 140 mmol/L (ref 135–145)

## 2015-11-11 LAB — CBC
HEMATOCRIT: 37.5 % — AB (ref 39.0–52.0)
HEMOGLOBIN: 11.7 g/dL — AB (ref 13.0–17.0)
MCH: 27.5 pg (ref 26.0–34.0)
MCHC: 31.2 g/dL (ref 30.0–36.0)
MCV: 88.2 fL (ref 78.0–100.0)
Platelets: 145 10*3/uL — ABNORMAL LOW (ref 150–400)
RBC: 4.25 MIL/uL (ref 4.22–5.81)
RDW: 15.3 % (ref 11.5–15.5)
WBC: 6.1 10*3/uL (ref 4.0–10.5)

## 2015-11-11 LAB — SURGICAL PCR SCREEN
MRSA, PCR: NEGATIVE
STAPHYLOCOCCUS AUREUS: POSITIVE — AB

## 2015-11-11 SURGERY — ICD/BIV ICD GENERATOR CHANGEOUT

## 2015-11-11 MED ORDER — MUPIROCIN 2 % EX OINT
1.0000 "application " | TOPICAL_OINTMENT | Freq: Once | CUTANEOUS | Status: DC
  Filled 2015-11-11: qty 22

## 2015-11-11 MED ORDER — MUPIROCIN 2 % EX OINT
TOPICAL_OINTMENT | CUTANEOUS | Status: AC
  Administered 2015-11-11: 1
  Filled 2015-11-11: qty 22

## 2015-11-11 MED ORDER — LIDOCAINE HCL (PF) 1 % IJ SOLN
INTRAMUSCULAR | Status: AC
  Filled 2015-11-11: qty 60

## 2015-11-11 MED ORDER — SODIUM CHLORIDE 0.9 % IR SOLN
Status: AC
  Filled 2015-11-11: qty 2

## 2015-11-11 MED ORDER — VANCOMYCIN HCL IN DEXTROSE 1-5 GM/200ML-% IV SOLN
1000.0000 mg | INTRAVENOUS | Status: AC
  Administered 2015-11-11: 1000 mg via INTRAVENOUS

## 2015-11-11 MED ORDER — ACETAMINOPHEN 325 MG PO TABS: 325.0000 mg | ORAL_TABLET | ORAL | Status: DC | PRN

## 2015-11-11 MED ORDER — ONDANSETRON HCL 4 MG/2ML IJ SOLN: 4.0000 mg | Freq: Four times a day (QID) | INTRAMUSCULAR | Status: DC | PRN

## 2015-11-11 MED ORDER — VANCOMYCIN HCL IN DEXTROSE 1-5 GM/200ML-% IV SOLN
INTRAVENOUS | Status: AC
  Filled 2015-11-11: qty 200

## 2015-11-11 MED ORDER — SODIUM CHLORIDE 0.9 % IV SOLN
INTRAVENOUS | Status: DC
  Administered 2015-11-11: 10:00:00 via INTRAVENOUS

## 2015-11-11 MED ORDER — FENTANYL CITRATE (PF) 100 MCG/2ML IJ SOLN
INTRAMUSCULAR | Status: AC
  Filled 2015-11-11: qty 2

## 2015-11-11 MED ORDER — CHLORHEXIDINE GLUCONATE 4 % EX LIQD
60.0000 mL | Freq: Once | CUTANEOUS | Status: DC
  Filled 2015-11-11: qty 60

## 2015-11-11 MED ORDER — SODIUM CHLORIDE 0.9 % IR SOLN: 80.0000 mg | Status: DC

## 2015-11-11 MED ORDER — MIDAZOLAM HCL 5 MG/5ML IJ SOLN
INTRAMUSCULAR | Status: DC | PRN
  Administered 2015-11-11 (×2): 1 mg via INTRAVENOUS

## 2015-11-11 MED ORDER — MIDAZOLAM HCL 5 MG/5ML IJ SOLN
INTRAMUSCULAR | Status: AC
  Filled 2015-11-11: qty 5

## 2015-11-11 MED ORDER — FENTANYL CITRATE (PF) 100 MCG/2ML IJ SOLN
INTRAMUSCULAR | Status: DC | PRN
  Administered 2015-11-11 (×2): 12.5 ug via INTRAVENOUS

## 2015-11-11 SURGICAL SUPPLY — 9 items
CABLE SURGICAL S-101-97-12 (CABLE) ×2 IMPLANT
DEFIB DYNAGEN EL DR DUAL STRL (ICD Generator) ×3 IMPLANT
DEFIBRILATOR DYNAGN DR DUL STR (ICD Generator) IMPLANT
PAD DEFIB LIFELINK (PAD) ×2 IMPLANT
SHEATH CLASSIC 7F (SHEATH) IMPLANT
SHEATH CLASSIC 8F (SHEATH) IMPLANT
SHEATH CLASSIC 9.5F (SHEATH) IMPLANT
SHEATH CLASSIC 9F (SHEATH) IMPLANT
TRAY PACEMAKER INSERTION (PACKS) ×2 IMPLANT

## 2015-11-11 NOTE — H&P (View-Only) (Signed)
HPI Jacob [name] returns today for followup. He is a very pleasant 82 year old man with an ischemic cardiomyopathy, chronic systolic heart failure class II, ventricular tachycardia, and atrial fibrillation, status post ICD implantation. The patient has been well-controlled from his atrial fibrillation on medical therapy and he denies chest pain, or shortness of breath. No peripheral edema. He does admit to some sedentary lifestyle. He has reached ERI. He was hospitalized with worsening CHF and has been diuresed and feels much better.  Allergies  Allergen Reactions  . Bactrim [Sulfamethoxazole-Trimethoprim] Other (See Comments)    Throat swelling  . Penicillins Itching, Swelling and Rash     Current Outpatient Prescriptions  Medication Sig Dispense Refill  . aspirin EC 81 MG EC tablet Take 1 tablet (81 mg total) by mouth daily.    . dabigatran (PRADAXA) 150 MG CAPS capsule Take 1 capsule (150 mg total) by mouth 2 (two) times daily. 180 capsule 1  . DULoxetine (CYMBALTA) 20 MG capsule Take 2 capsules (40 mg total) by mouth daily. 180 capsule 0  . finasteride (PROSCAR) 5 MG tablet Take 1 tablet (5 mg total) by mouth daily. 90 tablet 1  . fluticasone (FLONASE) 50 MCG/ACT nasal spray SHAKE LIQUID AND USE 2 SPRAYS IN EACH NOSTRIL DAILY AS NEEDED FOR ALLERGIES OR RHINITIS 16 g 1  . furosemide (LASIX) 40 MG tablet Take 1 tablet (40 mg total) by mouth daily. 30 tablet 6  . hydrALAZINE (APRESOLINE) 25 MG tablet Take 1.5 tablets (37.5 mg total) by mouth every 8 (eight) hours. 120 tablet 6  . HYDROcodone-acetaminophen (NORCO) 7.5-325 MG tablet Take 1 tablet by mouth every 6 (six) hours as needed for moderate pain.    . isosorbide mononitrate (IMDUR) 30 MG 24 hr tablet Take 2 tablets (60 mg total) by mouth daily. 60 tablet 1  . metoprolol tartrate (LOPRESSOR) 25 MG tablet Take 1 tablet (25 mg total) by mouth 2 (two) times daily. 60 tablet 6  . NITROSTAT 0.4 MG SL tablet Place 0.4 mg under the tongue every 5  (five) minutes as needed. Reported on 10/27/2015    . omeprazole (PRILOSEC) 40 MG capsule Take 1 capsule (40 mg total) by mouth daily. 30 capsule 5  . ranitidine (ZANTAC) 300 MG tablet Take 1 tablet (300 mg total) by mouth at bedtime. 90 tablet 1  . rosuvastatin (CRESTOR) 10 MG tablet Take 1 tablet (10 mg total) by mouth at bedtime. 30 tablet 6  . solifenacin (VESICARE) 5 MG tablet Take 5 mg by mouth daily. Reported on 10/27/2015     No current facility-administered medications for this visit.     Past Medical History  Diagnosis Date  . Chronic systolic heart failure (HCC)     a. EF 25-30% in past; b.Echo 11/08/11: Normal LV wall thickness, EF 45-50%. c. Echo 04/17: EF 15-20%, diffuse akinesis, moderate MR, mod dilated LA, trivial PE   . HTN (hypertension)   . History of diverticulitis of colon   . GERD (gastroesophageal reflux disease)   . History of nephrolithiasis   . Ischemic cardiomyopathy     a. s/p ICD 2008   . Urinary retention     foley indwelling 12/2010-04/2011  . Arthritis   . HLD (hyperlipidemia)   . Coronary artery disease     a. Chronically occ RCA. cardiac cath 10/2010: Mid LAD 20-30%, distal LAD 40%, mid D1 40%, mid circumflex 20-30%, distal RCA occluded with left to right collaterals. c. 10/2015: cath- relatively stable dz since 2012.    Marland Kitchen  Benign prostatic hypertrophy   . TIA (transient ischemic attack) 10/2011    carotids neg for ICA stenosis  . Atrial fibrillation ([place])   . Skin cancer   . BPH (benign prostatic hyperplasia)   . Renal insufficiency     Renal failure in July 2012 secondary to hypotension, ARB use, and   . Esophageal stricture   . Hemorrhoids     ROS:   All systems reviewed and negative except as noted in the HPI.   Past Surgical History  Procedure Laterality Date  . Cardiac defibrillator placement  12/2006    AICD  . Total knee arthroplasty  01/04/11    L TKA  . Cardiac catheterization  11-05-10  . Joint replacement    . Cataracts removed     . Transurethral resection of prostate  06/14/2011    Procedure: TRANSURETHRAL RESECTION OF THE PROSTATE (TURP);  Surgeon: Bernestine Amass, MD;  [facility];  Service: Urology;  Laterality: N/A;  . Esophagogastroduodenoscopy N/A 01/24/2014    Procedure: ESOPHAGOGASTRODUODENOSCOPY (EGD);  Surgeon: Milus Banister, MD;  Location: [facility];  Service: Endoscopy;  Laterality: N/A;  . Cardiac catheterization N/A 10/14/2015    Procedure: Right/Left Heart Cath and Coronary Angiography;  Surgeon: Leonie Man, MD;  Location: [facility];  Service: Cardiovascular;  Laterality: N/A;     Family History  Problem Relation Age of Onset  . Heart disease Son     x 2  . Colon cancer Neg Hx   . Heart attack Mother 53    pt unsure of age  . Liver disease Father 76     Social History   Social History  . Marital Status: Married    Spouse Name: N/A  . Number of Children: N/A  . Years of Education: N/A   Occupational History  . retired    Social History Main Topics  . Smoking status: Former Smoker -- 2.00 packs/day for 30 years    Quit date: 06/14/1968  . Smokeless tobacco: Never Used  . Alcohol Use: No  . Drug Use: No  . Sexual Activity: Not on file   Other Topics Concern  . Not on file   Social History Narrative   Daily caffeine      BP 128/78 mmHg  Pulse 77  Ht 5' 7.5" (1.715 m)  Wt 159 lb 6.4 oz (72.303 kg)  BMI 24.58 kg/m2  Physical Exam:  Well appearing 82 year old man, NAD HEENT: Unremarkable Neck:  6 cm JVD, no thyromegally Lungs:  Clear except for scattered basilar rales, no wheezes, no rhonchi. HEART:  IRegular rate rhythm, no murmurs, no rubs, no clicks Abd:  soft, positive bowel sounds, no organomegally, no rebound, no guarding Ext:  2 plus pulses, no edema, no cyanosis, no clubbing Skin:  No rashes no nodules Neuro:  CN II through XII intact, motor grossly intact  ECG - atrial flutter with a controlled VR  DEVICE  Normal device function.  See  PaceArt for details.   Assess/Plan: 1. Chronic systolic heart failure - his symptoms are well compensated. His EF by echo is 20%. Will plan to proceed with ICD generator change out. 2. CAD - he denies anginal symptoms. Will follow.  3. Atrial flutter - he has had atrial fib in the past. He is well rate controlled presently 4. ICD - his [place] device is at [facility]. Will schedule generator change out.  Karissa Meenan,M.D.  Mikle Bosworth.D.

## 2015-11-11 NOTE — Discharge Instructions (Signed)
Pacemaker Battery Change, Care After Refer to this sheet in the next few weeks. These instructions provide you with information on caring for yourself after your procedure. Your health care provider may also give you more specific instructions. Your treatment has been planned according to current medical practices, but problems sometimes occur. Call your health care provider if you have any problems or questions after your procedure. WHAT TO EXPECT AFTER THE PROCEDURE After your procedure, it is typical to have the following sensations:  Soreness at the pacemaker site. HOME CARE INSTRUCTIONS   Keep the incision clean and dry.  Unless advised otherwise, you may shower beginning 48 hours after your procedure.  For the first week after the replacement, avoid stretching motions that pull at the incision site, and avoid heavy exercise with the arm that is on the same side as the incision.  Take medicines only as directed by your health care provider.  Keep all follow-up visits as directed by your health care provider. SEEK MEDICAL CARE IF:   You have pain at the incision site that is not relieved by over-the-counter or prescription medicine.  There is drainage or pus from the incision site.  There is swelling larger than a lime at the incision site.  You develop red streaking that extends above or below the incision site.  You feel brief, intermittent palpitations, light-headedness, or any symptoms that you feel might be related to your heart. SEEK IMMEDIATE MEDICAL CARE IF:   You experience chest pain that is different than the pain at the pacemaker site.  You experience shortness of breath.  You have palpitations or irregular heartbeat.  You have light-headedness that does not go away quickly.  You faint.  You have pain that gets worse and is not relieved by medicine.   This information is not intended to replace advice given to you by your health care provider. Make sure you  discuss any questions you have with your health care provider.   Document Released: 03/21/2013 Document Revised: 06/21/2014 Document Reviewed: 03/21/2013 Elsevier Interactive Patient Education 2016 Elsevier Inc. Mupirocin nasal ointment What is this medicine? MUPIROCIN CALCIUM (myoo PEER oh sin KAL see um) is an antibiotic. It is used inside the nose to treat infections that are caused by certain bacteria. This helps prevent the spread of infection to patients and health care workers during outbreaks at institutions. This medicine may be used for other purposes; ask your health care provider or pharmacist if you have questions. What should I tell my health care provider before I take this medicine? They need to know if you have any of these conditions: -an unusual or allergic reaction to mupirocin, other medicines, foods, dyes, or preservatives -pregnant or trying to get pregnant -breast-feeding How should I use this medicine? This medicine is only for use inside the nose. Follow the directions on the prescription label. Wash your hands before and after use. Squeeze half the contents of a single-use tube into one nostril, then squeeze the other half into the other nostril. Press the sides of your nose together and gently massage after application to spread the ointment throughout the nostrils. Do not use your medicine more often than directed. Finish the full course of medicine prescribed by your doctor or health care professional even if you think your condition is better. Talk to your pediatrician regarding the use of this medicine in children. Special care may be needed. Overdosage: If you think you have taken too much of this medicine contact a  poison control center or emergency room at once. NOTE: This medicine is only for you. Do not share this medicine with others. What if I miss a dose? If you miss a dose, take it as soon as you can. If it is almost time for your next dose, take only that  dose. Do not take double or extra doses. What may interact with this medicine? Interactions are not expected. Do not use any other nose products without telling your doctor or health care professional. This list may not describe all possible interactions. Give your health care provider a list of all the medicines, herbs, non-prescription drugs, or dietary supplements you use. Also tell them if you smoke, drink alcohol, or use illegal drugs. Some items may interact with your medicine. What should I watch for while using this medicine? If your nose is severely irritated, burning or stinging from use of this medicine, stop using it and contact your doctor or health care professional. Do not get this medicine in your eyes. If you do, rinse out with plenty of cool tap water. What side effects may I notice from receiving this medicine? Side effects that you should report to your doctor or health care professional as soon as possible: -severe irritation, burning, stinging, or pain Side effects that usually do not require medical attention (report to your doctor or health care professional if they continue or are bothersome): -altered taste -cough -headache -skin itching -sore throat -stuffy or runny nose This list may not describe all possible side effects. Call your doctor for medical advice about side effects. You may report side effects to FDA at 1-800-FDA-1088. Where should I keep my medicine? Keep out of the reach of children. Store at room temperature between 15 and 30 degrees C (59 and 86 degrees F). Do not refrigerate. One tube of ointment is for single use in both nostrils. Throw away after use. NOTE: This sheet is a summary. It may not cover all possible information. If you have questions about this medicine, talk to your doctor, pharmacist, or health care provider.    2016, Elsevier/Gold Standard. (2007-12-18 14:36:10)

## 2015-11-11 NOTE — Interval H&P Note (Signed)
History and Physical Interval Note:  11/11/2015 1:11 PM  Jacob [name]  has presented today for surgery, with the diagnosis of eri  The various methods of treatment have been discussed with the patient and family. After consideration of risks, benefits and other options for treatment, the patient has consented to  Procedure(s):  ICD [employer] (N/A) as a surgical intervention .  The patient's history has been reviewed, patient examined, no change in status, stable for surgery.  I have reviewed the patient's chart and labs.  Questions were answered to the patient's satisfaction.     Cristopher Peru

## 2015-11-12 ENCOUNTER — Other Ambulatory Visit: Payer: Self-pay | Admitting: *Deleted

## 2015-11-12 ENCOUNTER — Encounter (HOSPITAL_COMMUNITY): Payer: Self-pay | Admitting: Internal Medicine

## 2015-11-12 MED FILL — Gentamicin Sulfate Inj 40 MG/ML: INTRAMUSCULAR | Qty: 2 | Status: AC

## 2015-11-12 MED FILL — Sodium Chloride Irrigation Soln 0.9%: Qty: 500 | Status: AC

## 2015-11-12 MED FILL — Vancomycin HCl-Dextrose IV Soln 1 GM/200ML-5%: INTRAVENOUS | Qty: 200 | Status: AC

## 2015-11-12 MED FILL — Lidocaine HCl Local Preservative Free (PF) Inj 1%: INTRAMUSCULAR | Qty: 30 | Status: AC

## 2015-11-12 NOTE — Patient Outreach (Signed)
[place] [facility]) Care Management  11/12/2015  Jacob [name] [birth year] [id number]   Transition of care  RN spoke with pt today who verifies his recent pacer change-out. States he is doing well with no problems. RN inquired on pt's HF with reported weights today at 152 lbs, yesterday 151 lbs and last week the same with no swelling to his legs, hands or trunk area. Pt states he is breathing well, sleeping well with problems. Reviewed the plan of care and set goals as pt continues to work on his goals with attending all scheduled appointments, taking his medications as prescribed and weighing daily in monitoring his HF.Will continue to encouraged adherence with his management of care. RN confirmed next month's home visit and requested ongoing transition of care call. Will follow up accordingly.  Raina Mina, RN Care Management Coordinator [place] Office [phone]

## 2015-11-13 NOTE — H&P (Signed)
  ICD Criteria  Current LVEF:25%. Within 12 months prior to implant: Yes   Heart failure history: Yes, Class II  Cardiomyopathy history: Yes, Ischemic Cardiomyopathy.  Atrial Fibrillation/Atrial Flutter: Yes, Persistent (> 7 Days).  Ventricular tachycardia history: Yes, Hemodynamic instability present. VT Type: Sustained Ventricular Tachycardia - Monomorphic.  Cardiac arrest history: No.  History of syndromes with risk of sudden death: No.  Previous ICD: Yes, Reason for ICD:  Primary prevention.  Current ICD indication: Secondary  PPM indication: No.   Class I or II Bradycardia indication present: No  Beta Blocker therapy for 3 or more months: Yes, prescribed.   Ace Inhibitor/ARB therapy for 3 or more months: Yes, prescribed.

## 2015-11-18 ENCOUNTER — Encounter: Payer: Self-pay | Admitting: *Deleted

## 2015-11-18 NOTE — Progress Notes (Signed)
This encounter was created in error - please disregard.

## 2015-11-19 ENCOUNTER — Other Ambulatory Visit: Payer: Self-pay | Admitting: *Deleted

## 2015-11-19 NOTE — Patient Outreach (Signed)
[place] [facility]) Care Management  11/19/2015  COLWYN BAGGARLY Nov 17, 1931 [id number]   RN spoke with both the daughter [name] and the pt's son Akiem Koskie) both consented concerning pt today. Daughter reports pt doing well with weights remaining the same and holding 151 lbs with no related HF symptoms. Verified pt remains in the GREEN zone and continues to attend all scheduled appointments and take the recommended medications as prescribed. No other issues or request at this time as plan of care review and RN continued to encouraged adherence to prevent risk of readmission. All caregivers with understanding and again grateful for the follow up. RN verified the upcoming appointment and will complete the transition of care with the next home visit scheduled for next week.   Raina Mina, RN Care Management Coordinator [place] Office [phone]

## 2015-11-24 ENCOUNTER — Ambulatory Visit: Payer: Medicare Other

## 2015-11-27 ENCOUNTER — Encounter: Payer: Self-pay | Admitting: Internal Medicine

## 2015-11-27 ENCOUNTER — Other Ambulatory Visit: Payer: Self-pay | Admitting: *Deleted

## 2015-11-27 NOTE — Patient Outreach (Signed)
[place] [facility]) Care Management   11/27/2015  Jacob [name] [birth year] [id number]  Jacob [name] is an 82 y.o. male  Subjective:  HF: Pt verified his weighs but no weight today. Pt denies any signs or symptoms related to his CHF and verifies he is in the GREEN zone today after review the zones. Pt indicated he has weighed the same over the last week with no swelling or problems breathing.   MEDICAL APPOINTMENTS: Pt verifies he has attended all medical appointments as scheduled with no delays or cancellations. MEDICATIONS: Pt reports he needs two medications filled. States no changes to his daily medications and all medicines taken on time however one refill needed today with no refills available and pharmacy will need to contact pt's provider to fill this medication which will delay administration of this medication this morning.   Objective:   Review of Systems  All other systems reviewed and are negative.   Physical Exam  Constitutional: He is oriented to person, place, and time. He appears well-developed and well-nourished.  HENT:  Right Ear: External ear normal.  Left Ear: External ear normal.  Eyes: EOM are normal.  Neck: Normal range of motion.  Cardiovascular: Normal heart sounds.   Defibillator  Respiratory: Breath sounds normal.  GI: Soft. Bowel sounds are normal.  Musculoskeletal: Normal range of motion.  Neurological: He is alert and oriented to person, place, and time.  Skin: Skin is warm and dry.  Psychiatric: He has a normal mood and affect. His behavior is normal. Judgment and thought content normal.    Encounter Medications:   Outpatient Encounter Prescriptions as of 11/27/2015  Medication Sig Note  . aspirin EC 81 MG EC tablet Take 1 tablet (81 mg total) by mouth daily. 11/11/2015: Pt states he takes when he remembers it  . dabigatran (PRADAXA) 150 MG CAPS capsule Take 1 capsule (150 mg total) by mouth 2 (two) times daily.   . DULoxetine  (CYMBALTA) 20 MG capsule Take 2 capsules (40 mg total) by mouth daily. (Patient taking differently: Take 20 mg by mouth 2 (two) times daily. )   . finasteride (PROSCAR) 5 MG tablet Take 1 tablet (5 mg total) by mouth daily.   . fluticasone (FLONASE) 50 MCG/ACT nasal spray SHAKE LIQUID AND USE 2 SPRAYS IN EACH NOSTRIL DAILY AS NEEDED FOR ALLERGIES OR RHINITIS   . furosemide (LASIX) 40 MG tablet Take 1 tablet (40 mg total) by mouth daily.   . hydrALAZINE (APRESOLINE) 25 MG tablet Take 1.5 tablets (37.5 mg total) by mouth every 8 (eight) hours.   Marland Kitchen HYDROcodone-acetaminophen (NORCO) 7.5-325 MG tablet Take 1 tablet by mouth every 6 (six) hours as needed for moderate pain.   . isosorbide mononitrate (IMDUR) 30 MG 24 hr tablet Take 2 tablets (60 mg total) by mouth daily. (Patient taking differently: Take 30 mg by mouth 2 (two) times daily. )   . metoprolol tartrate (LOPRESSOR) 25 MG tablet Take 1 tablet (25 mg total) by mouth 2 (two) times daily.   Marland Kitchen NITROSTAT 0.4 MG SL tablet Place 0.4 mg under the tongue every 5 (five) minutes as needed. Reported on 10/27/2015   . omeprazole (PRILOSEC) 40 MG capsule Take 1 capsule (40 mg total) by mouth daily.   . ranitidine (ZANTAC) 300 MG tablet Take 1 tablet (300 mg total) by mouth at bedtime.   . rosuvastatin (CRESTOR) 10 MG tablet Take 1 tablet (10 mg total) by mouth at bedtime.   . solifenacin (VESICARE) 5  MG tablet Take 5 mg by mouth daily. Reported on 10/27/2015    No facility-administered encounter medications on file as of 11/27/2015.    Functional Status:   In your present state of health, do you have any difficulty performing the following activities: 11/11/2015 11/05/2015  Hearing? N Y  Vision? N Y  Difficulty concentrating or making decisions? N Y  Walking or climbing stairs? Y Y  Dressing or bathing? N N  Doing errands, shopping? - Y  Preparing Food and eating ? - Y  Using the Toilet? - N  In the past six months, have you accidently leaked urine? - Y   Do you have problems with loss of bowel control? - N  Managing your Medications? - Y  Managing your Finances? - Y  Housekeeping or managing your Housekeeping? - Y  BP 120/64 mmHg  Pulse 74  Resp 20  Ht 1.727 m (5\' 8" )  Wt 152 lb (68.947 kg)  BMI 23.12 kg/m2  SpO2 96%  Fall/Depression Screening:    PHQ 2/9 Scores 11/05/2015 10/27/2015  PHQ - 2 Score 0 0    Assessment:   Ongoing case management related to HF Follow up on medical appointments Medication adherence related to prescribed medications   Plan:  Will a physical assessment and strongly encouraged adherence with daily HF monitoring. Will review the HF zones and verify pt remains in the GREEN zone and has an understanding of what to do if acute symptoms should occur. Will verify pt continues to weigh daily and document all weights (no weight today). Will strongly encouraged adherence with weights and the importance of daliy monitoring of his HF to prevent risk of acute symptoms. Will verify pt has attended all scheduled medical appointments and continue to encouraged adherence and why this is importance as a prevention measures related to his medical issues. Will offer to call in his needed refills and encouraged pt to contact his pharmacy ahead of time to allow refill to be completed and not to wait until he is out of his medications completely delaying his administration time.  Plan of care review and adjusted goals accordingly to allow adherence. Will re-evaluate on next home visit in July and assess for possible wean via [facility] program if pt remains adherent with his goals. No other inquires or request at this time. Will schedule next month's home visit for ongoing case management services via [facility].  Raina Mina, RN Care Management Coordinator [place] Office [phone]

## 2015-12-08 ENCOUNTER — Ambulatory Visit (HOSPITAL_COMMUNITY): Payer: Medicare Other

## 2015-12-08 ENCOUNTER — Encounter: Payer: PRIVATE HEALTH INSURANCE | Admitting: Internal Medicine

## 2015-12-10 ENCOUNTER — Telehealth: Payer: Self-pay | Admitting: Gastroenterology

## 2015-12-10 NOTE — Telephone Encounter (Signed)
Offer 12/18/15 at 1:30 pm with Alonza Bogus, PA-C

## 2015-12-15 DIAGNOSIS — Z79891 Long term (current) use of opiate analgesic: Secondary | ICD-10-CM | POA: Diagnosis not present

## 2015-12-15 DIAGNOSIS — M5136 Other intervertebral disc degeneration, lumbar region: Secondary | ICD-10-CM | POA: Diagnosis not present

## 2015-12-18 ENCOUNTER — Ambulatory Visit: Payer: Medicare Other | Admitting: Gastroenterology

## 2015-12-29 ENCOUNTER — Encounter: Payer: Medicare Other | Admitting: Internal Medicine

## 2015-12-30 ENCOUNTER — Encounter: Payer: Self-pay | Admitting: *Deleted

## 2015-12-30 ENCOUNTER — Other Ambulatory Visit: Payer: Self-pay | Admitting: *Deleted

## 2015-12-30 NOTE — Patient Outreach (Signed)
[place] [facility]) Care Management   12/30/2015  [name] [birth year] [id number]  Jacob [name] is an 82 y.o. male  Subjective:  HF: Pt reports he is in the GREEN zone with no reported issues or problems. Pt present all documented readings and reports one error that was confirmed with a weight gain. Pt remains at his dry weight denying any fluid retention or swelling and no SOB or breathing issues reported. Pt feels he is "doing great" with no major issues over the last month since RN last home visit.  MEDICATIONS: Pt verified all his medications and reports he has enough refills. Pt denies any recent changes to his medication list as reviewed and confirmed.  MEDICAL APPOINTMENTS: Pt reports he has been attending his medical appointments and [name] his daughter is working with [employer] for other source of transportation (decline any needed assistance).  [name] pt's daughter indicated pt will soon start out patient PT services as she requested this due to the pt have issues with "stumbling" during his ambulation but no falls or injuries have occurred. Pt currently pending his initial appointment. No other issues or incidents reported.   Objective:   Review of Systems  All other systems reviewed and are negative.   Physical Exam  Constitutional: He is oriented to person, place, and time. He appears well-developed and well-nourished.  HENT:  Right Ear: External ear normal.  Left Ear: External ear normal.  Eyes: EOM are normal.  Neck: Normal range of motion.  Cardiovascular: Normal heart sounds.   Respiratory: Effort normal.  GI: Soft. Bowel sounds are normal.  Musculoskeletal: Normal range of motion.  Neurological: He is alert and oriented to person, place, and time.  Skin: Skin is warm and dry.  Psychiatric: He has a normal mood and affect. His behavior is normal. Judgment and thought content normal.    Encounter Medications:   Outpatient Encounter Prescriptions as of  12/30/2015  Medication Sig Note  . aspirin EC 81 MG EC tablet Take 1 tablet (81 mg total) by mouth daily. 11/11/2015: Pt states he takes when he remembers it  . dabigatran (PRADAXA) 150 MG CAPS capsule Take 1 capsule (150 mg total) by mouth 2 (two) times daily.   . DULoxetine (CYMBALTA) 20 MG capsule Take 2 capsules (40 mg total) by mouth daily. (Patient taking differently: Take 20 mg by mouth 2 (two) times daily. )   . finasteride (PROSCAR) 5 MG tablet Take 1 tablet (5 mg total) by mouth daily.   . fluticasone (FLONASE) 50 MCG/ACT nasal spray SHAKE LIQUID AND USE 2 SPRAYS IN EACH NOSTRIL DAILY AS NEEDED FOR ALLERGIES OR RHINITIS   . furosemide (LASIX) 40 MG tablet Take 1 tablet (40 mg total) by mouth daily.   . hydrALAZINE (APRESOLINE) 25 MG tablet Take 1.5 tablets (37.5 mg total) by mouth every 8 (eight) hours.   Marland Kitchen HYDROcodone-acetaminophen (NORCO) 7.5-325 MG tablet Take 1 tablet by mouth every 6 (six) hours as needed for moderate pain.   . isosorbide mononitrate (IMDUR) 30 MG 24 hr tablet Take 2 tablets (60 mg total) by mouth daily. (Patient taking differently: Take 30 mg by mouth 2 (two) times daily. )   . metoprolol tartrate (LOPRESSOR) 25 MG tablet Take 1 tablet (25 mg total) by mouth 2 (two) times daily.   Marland Kitchen NITROSTAT 0.4 MG SL tablet Place 0.4 mg under the tongue every 5 (five) minutes as needed. Reported on 10/27/2015   . omeprazole (PRILOSEC) 40 MG capsule Take 1  capsule (40 mg total) by mouth daily.   . ranitidine (ZANTAC) 300 MG tablet Take 1 tablet (300 mg total) by mouth at bedtime.   . rosuvastatin (CRESTOR) 10 MG tablet Take 1 tablet (10 mg total) by mouth at bedtime.   . solifenacin (VESICARE) 5 MG tablet Take 5 mg by mouth daily. Reported on 10/27/2015    No facility-administered encounter medications on file as of 12/30/2015.    Functional Status:   In your present state of health, do you have any difficulty performing the following activities: 11/11/2015 11/05/2015  Hearing? N Y   Vision? N Y  Difficulty concentrating or making decisions? N Y  Walking or climbing stairs? Y Y  Dressing or bathing? N N  Doing errands, shopping? - Y  Preparing Food and eating ? - Y  Using the Toilet? - N  In the past six months, have you accidently leaked urine? - Y  Do you have problems with loss of bowel control? - N  Managing your Medications? - Y  Managing your Finances? - Y  Housekeeping or managing your Housekeeping? - Y   BP 130/72 mmHg  Pulse 68  Resp 20  Ht 1.727 m (_0 )  Wt 152 lb (68.947 kg)  BMI 23.12 kg/m2  SpO2 98% Fall/Depression Screening:    PHQ 2/9 Scores 11/05/2015 10/27/2015  PHQ - 2 Score 0 0    Assessment:   Case management follow up related to HF Follow up ongoing adherence related to medications Follow up ongoing adherence related to medical appointments  Plan:  Will completed a physical assessment with no acute issues or problems presented today. Pt able to recite some of the signs and symptoms related to his HF but depends on the assistance from his daughter Jacob [name]) who lives with him. Confirmed pt is aware of the HF zones and remains in the GREEN zone with no acute problems reported today. Will verify pt is administering all of his medications as recommended and no needed refills or encountered issues. Will continue to encouraged adherence with his daily medications to prevention acute issues from occurring. Will verify has been attending all his medical appointments as scheduled with no delays.  Will continue to encouraged adherence with all that has been discussed above.  Daughter verify she is working on the [employer] with no needed assistance from [facility] staff. This information was provided several weeks ago as daughter planned to completed and reach out to the agency directly. Plan of care discussed with all goals met with pt understanding.  Will alert primary doctor of pt's progress and success in graduating from the [facility] program with case  closure today. Pt has agreed with this discharge and feels he has made major progress and able to manage his care more independently with the assistance from his daughter Jacob [name]. Pt aware on how to contact [facility] office if further services are needed in the future. No other inquires or request at this time.   Raina Mina, RN Care Management Coordinator [place] Office [phone]

## 2016-01-01 ENCOUNTER — Other Ambulatory Visit: Payer: Self-pay | Admitting: Internal Medicine

## 2016-01-01 DIAGNOSIS — M5136 Other intervertebral disc degeneration, lumbar region: Secondary | ICD-10-CM | POA: Diagnosis not present

## 2016-01-06 DIAGNOSIS — M5136 Other intervertebral disc degeneration, lumbar region: Secondary | ICD-10-CM | POA: Diagnosis not present

## 2016-01-08 DIAGNOSIS — M5136 Other intervertebral disc degeneration, lumbar region: Secondary | ICD-10-CM | POA: Diagnosis not present

## 2016-01-13 DIAGNOSIS — M5136 Other intervertebral disc degeneration, lumbar region: Secondary | ICD-10-CM | POA: Diagnosis not present

## 2016-01-15 DIAGNOSIS — M5136 Other intervertebral disc degeneration, lumbar region: Secondary | ICD-10-CM | POA: Diagnosis not present

## 2016-01-16 ENCOUNTER — Telehealth: Payer: Self-pay | Admitting: *Deleted

## 2016-01-16 NOTE — Telephone Encounter (Signed)
I spoke with patient grandson Rachel Bo for 68-month follow-up for REDS@Discharge  Study.. He stated patient was doing well. He has not been re-hospitalized in last 3 months for volume overload. Patient had battery change out for ICD.  Patient has completed the REDS@Discharge  Study and I thanked patient for his participation in the study.

## 2016-01-20 ENCOUNTER — Encounter: Payer: Self-pay | Admitting: Internal Medicine

## 2016-01-20 DIAGNOSIS — M5136 Other intervertebral disc degeneration, lumbar region: Secondary | ICD-10-CM | POA: Diagnosis not present

## 2016-01-22 DIAGNOSIS — M5136 Other intervertebral disc degeneration, lumbar region: Secondary | ICD-10-CM | POA: Diagnosis not present

## 2016-01-27 DIAGNOSIS — M5136 Other intervertebral disc degeneration, lumbar region: Secondary | ICD-10-CM | POA: Diagnosis not present

## 2016-02-03 DIAGNOSIS — M5136 Other intervertebral disc degeneration, lumbar region: Secondary | ICD-10-CM | POA: Diagnosis not present

## 2016-02-04 ENCOUNTER — Encounter: Payer: Self-pay | Admitting: Internal Medicine

## 2016-02-04 ENCOUNTER — Ambulatory Visit (INDEPENDENT_AMBULATORY_CARE_PROVIDER_SITE_OTHER): Payer: Medicare Other | Admitting: Internal Medicine

## 2016-02-04 VITALS — BP 118/66 | HR 79 | Ht 68.0 in | Wt 159.2 lb

## 2016-02-04 DIAGNOSIS — I4891 Unspecified atrial fibrillation: Secondary | ICD-10-CM | POA: Diagnosis not present

## 2016-02-04 DIAGNOSIS — I255 Ischemic cardiomyopathy: Secondary | ICD-10-CM

## 2016-02-04 DIAGNOSIS — I472 Ventricular tachycardia, unspecified: Secondary | ICD-10-CM

## 2016-02-04 DIAGNOSIS — I429 Cardiomyopathy, unspecified: Secondary | ICD-10-CM

## 2016-02-04 DIAGNOSIS — I482 Chronic atrial fibrillation, unspecified: Secondary | ICD-10-CM

## 2016-02-04 LAB — CUP PACEART INCLINIC DEVICE CHECK
Date Time Interrogation Session: 20170823040000
HIGH POWER IMPEDANCE MEASURED VALUE: 42 Ohm
Implantable Lead Location: 753859
Implantable Lead Location: 753860
Implantable Lead Model: 4087
Lead Channel Impedance Value: 514 Ohm
Lead Channel Impedance Value: 913 Ohm
Lead Channel Pacing Threshold Pulse Width: 0.4 ms
Lead Channel Sensing Intrinsic Amplitude: 3 mV
Lead Channel Setting Pacing Amplitude: 2.5 V
Lead Channel Setting Pacing Pulse Width: 0.4 ms
MDC IDC LEAD IMPLANT DT: 20080716
MDC IDC LEAD IMPLANT DT: 20080716
MDC IDC LEAD MODEL: 158
MDC IDC LEAD SERIAL: 174941
MDC IDC LEAD SERIAL: 281722
MDC IDC MSMT LEADCHNL RV PACING THRESHOLD AMPLITUDE: 0.8 V
MDC IDC MSMT LEADCHNL RV SENSING INTR AMPL: 9.9 mV
MDC IDC SET LEADCHNL RV SENSING SENSITIVITY: 0.6 mV
Pulse Gen Serial Number: 206833

## 2016-02-04 NOTE — Patient Instructions (Signed)
Medication Instructions:  Your physician recommends that you continue on your current medications as directed. Please refer to the Current Medication list given to you today.   Labwork: None ordered   Testing/Procedures: None ordered   Follow-Up: Your physician wants you to follow-up in: 9 months with Dr. Lovena Le. You will receive a reminder letter in the mail two months in advance. If you don't receive a letter, please call our office to schedule the follow-up appointment.  Remote monitoring is used to monitor your ICD from home. This monitoring reduces the number of office visits required to check your device to one time per year. It allows [place] to keep an eye on the functioning of your device to ensure it is working properly. You are scheduled for a device check from home on 05/05/2016. You may send your transmission at any time that day. If you have a wireless device, the transmission will be sent automatically. After your physician reviews your transmission, you will receive a postcard with your next transmission date.     Any Other Special Instructions Will Be Listed Below (If Applicable).     If you need a refill on your cardiac medications before your next appointment, please call your pharmacy.

## 2016-02-04 NOTE — Progress Notes (Signed)
HPI [name] returns today for followup. He is a very pleasant 82 year old man with an ischemic cardiomyopathy, chronic systolic heart failure class II, ventricular tachycardia, and atrial fibrillation, status post ICD implantation. The patient has been well-controlled from his atrial fibrillation on medical therapy and he denies chest pain, or shortness of breath. No peripheral edema. He does admit to some sedentary lifestyle. He was hospitalized with worsening CHF and has been diuresed and feels much better. He has healed up nicely from ICD generator change out. Allergies  Allergen Reactions  . Bactrim [Sulfamethoxazole-Trimethoprim] Other (See Comments)    Throat swelling  . Penicillins Itching, Swelling and Rash     Current Outpatient Prescriptions  Medication Sig Dispense Refill  . dabigatran (PRADAXA) 150 MG CAPS capsule Take 1 capsule (150 mg total) by mouth 2 (two) times daily. 180 capsule 1  . DULoxetine (CYMBALTA) 20 MG capsule TAKE 2 CAPSULES(40 MG) BY MOUTH DAILY 180 capsule 1  . finasteride (PROSCAR) 5 MG tablet Take 1 tablet (5 mg total) by mouth daily. 90 tablet 1  . fluticasone (FLONASE) 50 MCG/ACT nasal spray SHAKE LIQUID AND USE 2 SPRAYS IN EACH NOSTRIL DAILY AS NEEDED FOR ALLERGIES OR RHINITIS 16 g 1  . furosemide (LASIX) 40 MG tablet Take 1 tablet (40 mg total) by mouth daily. 30 tablet 6  . hydrALAZINE (APRESOLINE) 25 MG tablet Take 1.5 tablets (37.5 mg total) by mouth every 8 (eight) hours. 120 tablet 6  . HYDROcodone-acetaminophen (NORCO) 7.5-325 MG tablet Take 1 tablet by mouth every 6 (six) hours as needed for moderate pain.    . isosorbide mononitrate (IMDUR) 30 MG 24 hr tablet Take 30 mg by mouth daily.    . metoprolol tartrate (LOPRESSOR) 25 MG tablet Take 1 tablet (25 mg total) by mouth 2 (two) times daily. 60 tablet 6  . NITROSTAT 0.4 MG SL tablet Place 0.4 mg under the tongue every 5 (five) minutes x 3 doses as needed for chest pain. Reported on 10/27/2015    .  omeprazole (PRILOSEC) 40 MG capsule Take 1 capsule (40 mg total) by mouth daily. 30 capsule 5  . ranitidine (ZANTAC) 300 MG tablet Take 1 tablet (300 mg total) by mouth at bedtime. 90 tablet 1  . rosuvastatin (CRESTOR) 10 MG tablet Take 1 tablet (10 mg total) by mouth at bedtime. 30 tablet 6  . solifenacin (VESICARE) 5 MG tablet Take 5 mg by mouth daily. Reported on 10/27/2015     No current facility-administered medications for this visit.      Past Medical History:  Diagnosis Date  . Arthritis   . Atrial fibrillation ([place])   . Benign prostatic hypertrophy   . BPH (benign prostatic hyperplasia)   . Chronic systolic heart failure (HCC)    a. EF 25-30% in past; b.Echo 11/08/11: Normal LV wall thickness, EF 45-50%. c. Echo 04/17: EF 15-20%, diffuse akinesis, moderate MR, mod dilated LA, trivial PE   . Coronary artery disease    a. Chronically occ RCA. cardiac cath 10/2010: Mid LAD 20-30%, distal LAD 40%, mid D1 40%, mid circumflex 20-30%, distal RCA occluded with left to right collaterals. c. 10/2015: cath- relatively stable dz since 2012.    Marland Kitchen Esophageal stricture   . GERD (gastroesophageal reflux disease)   . Hemorrhoids   . History of diverticulitis of colon   . History of nephrolithiasis   . HLD (hyperlipidemia)   . HTN (hypertension)   . Ischemic cardiomyopathy    a. s/p ICD 2008   .  Renal insufficiency    Renal failure in July 2012 secondary to hypotension, ARB use, and   . Skin cancer   . TIA (transient ischemic attack) 10/2011   carotids neg for ICA stenosis  . Urinary retention    foley indwelling 12/2010-04/2011    ROS:   All systems reviewed and negative except as noted in the HPI.   Past Surgical History:  Procedure Laterality Date  . CARDIAC CATHETERIZATION  11-05-10  . CARDIAC CATHETERIZATION N/A 10/14/2015   Procedure: Right/Left Heart Cath and Coronary Angiography;  Surgeon: Leonie Man, MD;  Location: [facility];  Service: Cardiovascular;  Laterality:  N/A;  . CARDIAC DEFIBRILLATOR PLACEMENT  12/2006   AICD  . CATARACTS REMOVED    . EP IMPLANTABLE DEVICE N/A 11/11/2015   Procedure:  ICD Generator Changeout;  Surgeon: Evans Lance, MD;  Location: [facility];  Service: Cardiovascular;  Laterality: N/A;  . ESOPHAGOGASTRODUODENOSCOPY N/A 01/24/2014   Procedure: ESOPHAGOGASTRODUODENOSCOPY (EGD);  Surgeon: Milus Banister, MD;  Location: [facility];  Service: Endoscopy;  Laterality: N/A;  . JOINT REPLACEMENT    . TOTAL KNEE ARTHROPLASTY  01/04/11   L TKA  . TRANSURETHRAL RESECTION OF PROSTATE  06/14/2011   Procedure: TRANSURETHRAL RESECTION OF THE PROSTATE (TURP);  Surgeon: Bernestine Amass, MD;  Location: [facility];  Service: Urology;  Laterality: N/A;     Family History  Problem Relation Age of Onset  . Heart attack Mother 54    pt unsure of age  . Liver disease Father 38  . Heart disease Son     x 2  . Colon cancer Neg Hx      Social History   Social History  . Marital status: Married    Spouse name: N/A  . Number of children: N/A  . Years of education: N/A   Occupational History  . retired    Social History Main Topics  . Smoking status: Former Smoker    Packs/day: 2.00    Years: 30.00    Quit date: 06/14/1968  . Smokeless tobacco: Never Used  . Alcohol use No  . Drug use: No  . Sexual activity: Not on file   Other Topics Concern  . Not on file   Social History Narrative   Daily caffeine      BP 118/66   Pulse 79   Ht 5\' 8"  (1.727 m)   Wt 159 lb 3.2 oz (72.2 kg)   BMI 24.21 kg/m   Physical Exam:  Well appearing 82 year old man, NAD HEENT: Unremarkable Neck:  6 cm JVD, no thyromegally Lungs:  Clear except for scattered basilar rales, no wheezes, no rhonchi. HEART:  IRegular rate rhythm, no murmurs, no rubs, no clicks Abd:  soft, positive bowel sounds, no organomegally, no rebound, no guarding Ext:  2 plus pulses, no edema, no cyanosis, no clubbing Skin:  No rashes no nodules Neuro:  CN II  through XII intact, motor grossly intact  ECG - atrial flutter with a controlled VR  DEVICE  Normal device function.  See PaceArt for details.   Assess/Plan: 1. Chronic systolic heart failure - his symptoms are well compensated. His EF by echo is 20%.  2. CAD - he denies anginal symptoms. Will follow.  3. Atrial flutter - he has had atrial fib in the past. He is well rate controlled presently. I tried to pace terminate his atrial flutter but was unsuccessful. 4. ICD - his [place] device is working normally. Will  recheck in several months.  Mikle Bosworth.D.

## 2016-02-05 DIAGNOSIS — M5136 Other intervertebral disc degeneration, lumbar region: Secondary | ICD-10-CM | POA: Diagnosis not present

## 2016-02-10 DIAGNOSIS — M5136 Other intervertebral disc degeneration, lumbar region: Secondary | ICD-10-CM | POA: Diagnosis not present

## 2016-02-12 DIAGNOSIS — M5136 Other intervertebral disc degeneration, lumbar region: Secondary | ICD-10-CM | POA: Diagnosis not present

## 2016-02-13 ENCOUNTER — Emergency Department (HOSPITAL_COMMUNITY)
Admission: EM | Admit: 2016-02-13 | Discharge: 2016-02-14 | Disposition: A | Discharge status: Home / Self Care | Payer: Medicare Other | Source: Home / Self Care | Attending: Emergency Medicine | Admitting: Emergency Medicine

## 2016-02-13 ENCOUNTER — Encounter (HOSPITAL_COMMUNITY): Payer: Self-pay | Admitting: Emergency Medicine

## 2016-02-13 DIAGNOSIS — I2510 Atherosclerotic heart disease of native coronary artery without angina pectoris: Secondary | ICD-10-CM | POA: Insufficient documentation

## 2016-02-13 DIAGNOSIS — I5043 Acute on chronic combined systolic (congestive) and diastolic (congestive) heart failure: Secondary | ICD-10-CM | POA: Insufficient documentation

## 2016-02-13 DIAGNOSIS — X58XXXA Exposure to other specified factors, initial encounter: Secondary | ICD-10-CM

## 2016-02-13 DIAGNOSIS — T18120A Food in esophagus causing compression of trachea, initial encounter: Secondary | ICD-10-CM

## 2016-02-13 DIAGNOSIS — I110 Hypertensive heart disease with heart failure: Secondary | ICD-10-CM | POA: Insufficient documentation

## 2016-02-13 DIAGNOSIS — Y929 Unspecified place or not applicable: Secondary | ICD-10-CM | POA: Insufficient documentation

## 2016-02-13 DIAGNOSIS — Z85828 Personal history of other malignant neoplasm of skin: Secondary | ICD-10-CM

## 2016-02-13 DIAGNOSIS — Z7951 Long term (current) use of inhaled steroids: Secondary | ICD-10-CM | POA: Insufficient documentation

## 2016-02-13 DIAGNOSIS — Y939 Activity, unspecified: Secondary | ICD-10-CM

## 2016-02-13 DIAGNOSIS — Z87891 Personal history of nicotine dependence: Secondary | ICD-10-CM | POA: Insufficient documentation

## 2016-02-13 DIAGNOSIS — Z95810 Presence of automatic (implantable) cardiac defibrillator: Secondary | ICD-10-CM

## 2016-02-13 DIAGNOSIS — Y999 Unspecified external cause status: Secondary | ICD-10-CM | POA: Insufficient documentation

## 2016-02-13 DIAGNOSIS — Z791 Long term (current) use of non-steroidal anti-inflammatories (NSAID): Secondary | ICD-10-CM

## 2016-02-13 DIAGNOSIS — Z79899 Other long term (current) drug therapy: Secondary | ICD-10-CM | POA: Insufficient documentation

## 2016-02-13 DIAGNOSIS — T18128A Food in esophagus causing other injury, initial encounter: Secondary | ICD-10-CM

## 2016-02-13 NOTE — ED Triage Notes (Signed)
Pt states after eating [employer] chicken  @ 2030 he is unable to swallow a piece of foot lodged in throat. Pt states he has had this happen several times in the past, has had to have GI come to ED and do endo

## 2016-02-13 NOTE — ED Notes (Signed)
Bed: WA06 Expected date:  Expected time:  Means of arrival:  Comments: Triage 2 

## 2016-02-14 ENCOUNTER — Emergency Department (HOSPITAL_COMMUNITY): Payer: Medicare Other

## 2016-02-14 ENCOUNTER — Encounter (HOSPITAL_COMMUNITY)
Admission: EM | Disposition: A | Discharge status: Home Health | Payer: Self-pay | Source: Home / Self Care | Attending: Internal Medicine

## 2016-02-14 ENCOUNTER — Telehealth: Payer: Self-pay | Admitting: Gastroenterology

## 2016-02-14 ENCOUNTER — Inpatient Hospital Stay (HOSPITAL_COMMUNITY)
Admission: EM | Admit: 2016-02-14 | Discharge: 2016-02-16 | DRG: 393 | Disposition: A | Discharge status: Home Health | Payer: Medicare Other | Attending: Internal Medicine | Admitting: Internal Medicine

## 2016-02-14 ENCOUNTER — Encounter (HOSPITAL_COMMUNITY): Payer: Self-pay | Admitting: Emergency Medicine

## 2016-02-14 DIAGNOSIS — T18120A Food in esophagus causing compression of trachea, initial encounter: Secondary | ICD-10-CM | POA: Diagnosis not present

## 2016-02-14 DIAGNOSIS — K222 Esophageal obstruction: Secondary | ICD-10-CM | POA: Diagnosis not present

## 2016-02-14 DIAGNOSIS — F329 Major depressive disorder, single episode, unspecified: Secondary | ICD-10-CM | POA: Diagnosis present

## 2016-02-14 DIAGNOSIS — E785 Hyperlipidemia, unspecified: Secondary | ICD-10-CM | POA: Diagnosis present

## 2016-02-14 DIAGNOSIS — I4891 Unspecified atrial fibrillation: Secondary | ICD-10-CM | POA: Diagnosis not present

## 2016-02-14 DIAGNOSIS — Z96652 Presence of left artificial knee joint: Secondary | ICD-10-CM | POA: Diagnosis present

## 2016-02-14 DIAGNOSIS — I255 Ischemic cardiomyopathy: Secondary | ICD-10-CM | POA: Diagnosis present

## 2016-02-14 DIAGNOSIS — K254 Chronic or unspecified gastric ulcer with hemorrhage: Secondary | ICD-10-CM | POA: Diagnosis present

## 2016-02-14 DIAGNOSIS — I10 Essential (primary) hypertension: Secondary | ICD-10-CM | POA: Diagnosis present

## 2016-02-14 DIAGNOSIS — E870 Hyperosmolality and hypernatremia: Secondary | ICD-10-CM | POA: Diagnosis not present

## 2016-02-14 DIAGNOSIS — J690 Pneumonitis due to inhalation of food and vomit: Secondary | ICD-10-CM | POA: Diagnosis not present

## 2016-02-14 DIAGNOSIS — K2211 Ulcer of esophagus with bleeding: Secondary | ICD-10-CM | POA: Diagnosis not present

## 2016-02-14 DIAGNOSIS — R918 Other nonspecific abnormal finding of lung field: Secondary | ICD-10-CM | POA: Diagnosis not present

## 2016-02-14 DIAGNOSIS — N401 Enlarged prostate with lower urinary tract symptoms: Secondary | ICD-10-CM | POA: Diagnosis present

## 2016-02-14 DIAGNOSIS — I2510 Atherosclerotic heart disease of native coronary artery without angina pectoris: Secondary | ICD-10-CM | POA: Diagnosis present

## 2016-02-14 DIAGNOSIS — R000 Tachycardia, unspecified: Secondary | ICD-10-CM | POA: Diagnosis not present

## 2016-02-14 DIAGNOSIS — Z87891 Personal history of nicotine dependence: Secondary | ICD-10-CM

## 2016-02-14 DIAGNOSIS — N183 Chronic kidney disease, stage 3 unspecified: Secondary | ICD-10-CM | POA: Diagnosis present

## 2016-02-14 DIAGNOSIS — E860 Dehydration: Secondary | ICD-10-CM | POA: Diagnosis present

## 2016-02-14 DIAGNOSIS — I130 Hypertensive heart and chronic kidney disease with heart failure and stage 1 through stage 4 chronic kidney disease, or unspecified chronic kidney disease: Secondary | ICD-10-CM | POA: Diagnosis present

## 2016-02-14 DIAGNOSIS — Z95810 Presence of automatic (implantable) cardiac defibrillator: Secondary | ICD-10-CM

## 2016-02-14 DIAGNOSIS — X58XXXA Exposure to other specified factors, initial encounter: Secondary | ICD-10-CM | POA: Diagnosis present

## 2016-02-14 DIAGNOSIS — Z8249 Family history of ischemic heart disease and other diseases of the circulatory system: Secondary | ICD-10-CM

## 2016-02-14 DIAGNOSIS — Z8673 Personal history of transient ischemic attack (TIA), and cerebral infarction without residual deficits: Secondary | ICD-10-CM

## 2016-02-14 DIAGNOSIS — T18128A Food in esophagus causing other injury, initial encounter: Secondary | ICD-10-CM | POA: Diagnosis not present

## 2016-02-14 DIAGNOSIS — Z882 Allergy status to sulfonamides status: Secondary | ICD-10-CM

## 2016-02-14 DIAGNOSIS — I482 Chronic atrial fibrillation: Secondary | ICD-10-CM | POA: Diagnosis present

## 2016-02-14 DIAGNOSIS — Z87442 Personal history of urinary calculi: Secondary | ICD-10-CM

## 2016-02-14 DIAGNOSIS — Z85828 Personal history of other malignant neoplasm of skin: Secondary | ICD-10-CM

## 2016-02-14 DIAGNOSIS — Z79899 Other long term (current) drug therapy: Secondary | ICD-10-CM

## 2016-02-14 DIAGNOSIS — R05 Cough: Secondary | ICD-10-CM | POA: Diagnosis not present

## 2016-02-14 DIAGNOSIS — I5022 Chronic systolic (congestive) heart failure: Secondary | ICD-10-CM | POA: Diagnosis not present

## 2016-02-14 DIAGNOSIS — N3281 Overactive bladder: Secondary | ICD-10-CM | POA: Diagnosis present

## 2016-02-14 DIAGNOSIS — K2970 Gastritis, unspecified, without bleeding: Secondary | ICD-10-CM | POA: Diagnosis present

## 2016-02-14 DIAGNOSIS — K219 Gastro-esophageal reflux disease without esophagitis: Secondary | ICD-10-CM | POA: Diagnosis present

## 2016-02-14 DIAGNOSIS — Z880 Allergy status to penicillin: Secondary | ICD-10-CM

## 2016-02-14 HISTORY — PX: ESOPHAGOGASTRODUODENOSCOPY: SHX5428

## 2016-02-14 LAB — COMPREHENSIVE METABOLIC PANEL
ALBUMIN: 4.2 g/dL (ref 3.5–5.0)
ALT: 13 U/L — ABNORMAL LOW (ref 17–63)
ANION GAP: 9 (ref 5–15)
AST: 22 U/L (ref 15–41)
Alkaline Phosphatase: 69 U/L (ref 38–126)
BUN: 23 mg/dL — ABNORMAL HIGH (ref 6–20)
CO2: 26 mmol/L (ref 22–32)
Calcium: 9.2 mg/dL (ref 8.9–10.3)
Chloride: 111 mmol/L (ref 101–111)
Creatinine, Ser: 1.67 mg/dL — ABNORMAL HIGH (ref 0.61–1.24)
GFR calc Af Amer: 42 mL/min — ABNORMAL LOW (ref >60)
GFR calc non Af Amer: 37 mL/min — ABNORMAL LOW (ref >60)
GLUCOSE: 153 mg/dL — AB (ref 65–99)
POTASSIUM: 4.2 mmol/L (ref 3.5–5.1)
SODIUM: 146 mmol/L — AB (ref 135–145)
Total Bilirubin: 1.6 mg/dL — ABNORMAL HIGH (ref 0.3–1.2)
Total Protein: 8.3 g/dL — ABNORMAL HIGH (ref 6.5–8.1)

## 2016-02-14 LAB — CBC WITH DIFFERENTIAL/PLATELET
BASOS PCT: 0 %
Basophils Absolute: 0 10*3/uL (ref 0.0–0.1)
EOS ABS: 0 10*3/uL (ref 0.0–0.7)
Eosinophils Relative: 0 %
HCT: 41.7 % (ref 39.0–52.0)
HEMOGLOBIN: 14.1 g/dL (ref 13.0–17.0)
LYMPHS ABS: 0.8 10*3/uL (ref 0.7–4.0)
Lymphocytes Relative: 5 %
MCH: 29.8 pg (ref 26.0–34.0)
MCHC: 33.8 g/dL (ref 30.0–36.0)
MCV: 88.2 fL (ref 78.0–100.0)
MONO ABS: 0.8 10*3/uL (ref 0.1–1.0)
Monocytes Relative: 5 %
NEUTROS ABS: 14.4 10*3/uL — AB (ref 1.7–7.7)
Neutrophils Relative %: 90 %
Platelets: 146 10*3/uL — ABNORMAL LOW (ref 150–400)
RBC: 4.73 MIL/uL (ref 4.22–5.81)
RDW: 15.6 % — AB (ref 11.5–15.5)
WBC: 16 10*3/uL — AB (ref 4.0–10.5)

## 2016-02-14 LAB — I-STAT TROPONIN, ED: Troponin i, poc: 0 ng/mL (ref 0.00–0.08)

## 2016-02-14 SURGERY — EGD (ESOPHAGOGASTRODUODENOSCOPY)
Anesthesia: Moderate Sedation

## 2016-02-14 MED ORDER — SODIUM CHLORIDE 0.9 % IV BOLUS (SEPSIS)
500.0000 mL | Freq: Once | INTRAVENOUS | Status: AC
  Administered 2016-02-14: 500 mL via INTRAVENOUS

## 2016-02-14 MED ORDER — LORAZEPAM 2 MG/ML IJ SOLN
0.5000 mg | Freq: Once | INTRAMUSCULAR | Status: AC
  Administered 2016-02-14: 0.5 mg via INTRAVENOUS
  Filled 2016-02-14: qty 1

## 2016-02-14 MED ORDER — LORAZEPAM 2 MG/ML IJ SOLN: 1.0000 mg | Freq: Once | INTRAMUSCULAR | Status: DC

## 2016-02-14 MED ORDER — MIDAZOLAM HCL 10 MG/2ML IJ SOLN
INTRAMUSCULAR | Status: DC | PRN
  Administered 2016-02-14 (×3): 1 mg via INTRAVENOUS

## 2016-02-14 MED ORDER — GLUCAGON HCL RDNA (DIAGNOSTIC) 1 MG IJ SOLR
1.0000 mg | Freq: Once | INTRAMUSCULAR | Status: AC
  Administered 2016-02-14: 1 mg via INTRAVENOUS
  Filled 2016-02-14: qty 1

## 2016-02-14 MED ORDER — FENTANYL CITRATE (PF) 100 MCG/2ML IJ SOLN
INTRAMUSCULAR | Status: DC | PRN
  Administered 2016-02-14 (×2): 25 ug via INTRAVENOUS

## 2016-02-14 MED ORDER — BUTAMBEN-TETRACAINE-BENZOCAINE 2-2-14 % EX AERO
INHALATION_SPRAY | CUTANEOUS | Status: DC | PRN
Start: 2016-02-14 — End: 2016-02-14
  Administered 2016-02-14: 2 via TOPICAL

## 2016-02-14 MED ORDER — MIDAZOLAM HCL 5 MG/ML IJ SOLN
INTRAMUSCULAR | Status: AC
  Filled 2016-02-14: qty 3

## 2016-02-14 MED ORDER — DILTIAZEM HCL 25 MG/5ML IV SOLN
10.0000 mg | Freq: Once | INTRAVENOUS | Status: AC
  Administered 2016-02-14: 10 mg via INTRAVENOUS
  Filled 2016-02-14: qty 5

## 2016-02-14 MED ORDER — ATROPINE SULFATE 1 MG/10ML IJ SOSY
PREFILLED_SYRINGE | INTRAMUSCULAR | Status: AC
  Filled 2016-02-14: qty 10

## 2016-02-14 MED ORDER — DIPHENHYDRAMINE HCL 50 MG/ML IJ SOLN
INTRAMUSCULAR | Status: AC
  Filled 2016-02-14: qty 1

## 2016-02-14 MED ORDER — NITROGLYCERIN 0.4 MG SL SUBL
0.4000 mg | SUBLINGUAL_TABLET | SUBLINGUAL | Status: DC | PRN
  Administered 2016-02-14 (×3): 0.4 mg via SUBLINGUAL
  Filled 2016-02-14: qty 1

## 2016-02-14 MED ORDER — FENTANYL CITRATE (PF) 100 MCG/2ML IJ SOLN
INTRAMUSCULAR | Status: AC
  Filled 2016-02-14: qty 4

## 2016-02-14 MED ORDER — ONDANSETRON HCL 4 MG/2ML IJ SOLN
4.0000 mg | Freq: Once | INTRAMUSCULAR | Status: AC
  Administered 2016-02-14: 4 mg via INTRAVENOUS
  Filled 2016-02-14: qty 2

## 2016-02-14 NOTE — ED Triage Notes (Signed)
Pt presents to the ED with trouble swallowing.  Drooling present.  Unable to tolerate swallowing since last night when he was discharged.  Pt's son states he called Dr. Jacinto Halim, the GI physician who manages the pt's care, and was told to come to the ED and let the EDP know to page them upon his arrival.  History of same.  Pt denies pain.  No difficulty breathing at this time.

## 2016-02-14 NOTE — Telephone Encounter (Signed)
Patient's son called complaining that his father hasnt been able to swallow anything since yesterday.  When asked why he didn't express his concern prior to discharge in the ER or call me this morning, he said he was unaware as his dad slept most of the day. Advised patient to come to ER immediately and will likely need endoscopy for disimpaction. Will also need CXR to evaluate for any possible perforation given food impaction for >24 hours.

## 2016-02-14 NOTE — Consult Note (Signed)
Consultation  Referring Provider:     ER physician Primary Care Physician:  Binnie Rail, MD Primary Gastroenterologist:        Dr Fuller Plan Reason for Consultation:     Food impaction            HPI:   Jacob [name] is a 82 y.o. male with multiple co-morbidities,A.Fib, CHF,esophageal dysmotility/stricture, dysphagia is here with c/o sensation of something struck in his chest.He choked on chicken yesterday evening, presented to ER was given Glucagon and sent home after he coughed up/regurgitated some pieces of chicken. Patient said he has been able to swallow anything, not even meds or sips of water. Denies any breathing difficulty, chest pain or abdominal pain. He doesn't lower dentures and is eating regular meals  Past Medical History:  Diagnosis Date  . Arthritis   . Atrial fibrillation ([place])   . Benign prostatic hypertrophy   . BPH (benign prostatic hyperplasia)   . Chronic systolic heart failure (HCC)    a. EF 25-30% in past; b.Echo 11/08/11: Normal LV wall thickness, EF 45-50%. c. Echo 04/17: EF 15-20%, diffuse akinesis, moderate MR, mod dilated LA, trivial PE   . Coronary artery disease    a. Chronically occ RCA. cardiac cath 10/2010: Mid LAD 20-30%, distal LAD 40%, mid D1 40%, mid circumflex 20-30%, distal RCA occluded with left to right collaterals. c. 10/2015: cath- relatively stable dz since 2012.    Marland Kitchen Esophageal stricture   . GERD (gastroesophageal reflux disease)   . Hemorrhoids   . History of diverticulitis of colon   . History of nephrolithiasis   . HLD (hyperlipidemia)   . HTN (hypertension)   . Ischemic cardiomyopathy    a. s/p ICD 2008   . Renal insufficiency    Renal failure in July 2012 secondary to hypotension, ARB use, and   . Skin cancer   . TIA (transient ischemic attack) 10/2011   carotids neg for ICA stenosis  . Urinary retention    foley indwelling 12/2010-04/2011    Past Surgical History:  Procedure Laterality Date  . CARDIAC CATHETERIZATION   11-05-10  . CARDIAC CATHETERIZATION N/A 10/14/2015   Procedure: Right/Left Heart Cath and Coronary Angiography;  Surgeon: Leonie Man, MD;  Location: [facility];  Service: Cardiovascular;  Laterality: N/A;  . CARDIAC DEFIBRILLATOR PLACEMENT  12/2006   AICD  . CATARACTS REMOVED    . EP IMPLANTABLE DEVICE N/A 11/11/2015   Procedure:  ICD Generator Changeout;  Surgeon: Evans Lance, MD;  Location: [facility];  Service: Cardiovascular;  Laterality: N/A;  . ESOPHAGOGASTRODUODENOSCOPY N/A 01/24/2014   Procedure: ESOPHAGOGASTRODUODENOSCOPY (EGD);  Surgeon: Milus Banister, MD;  Location: [facility];  Service: Endoscopy;  Laterality: N/A;  . JOINT REPLACEMENT    . TOTAL KNEE ARTHROPLASTY  01/04/11   L TKA  . TRANSURETHRAL RESECTION OF PROSTATE  06/14/2011   Procedure: TRANSURETHRAL RESECTION OF THE PROSTATE (TURP);  Surgeon: Bernestine Amass, MD;  Location: [facility];  Service: Urology;  Laterality: N/A;    Family History  Problem Relation Age of Onset  . Heart attack Mother 34    pt unsure of age  . Liver disease Father 68  . Heart disease Son     x 2  . Colon cancer Neg Hx      Social History  Substance Use Topics  . Smoking status: Former Smoker    Packs/day: 2.00    Years: 30.00  Quit date: 06/14/1968  . Smokeless tobacco: Never Used  . Alcohol use No    Prior to Admission medications   Medication Sig Start Date End Date Taking? Authorizing Provider  dabigatran (PRADAXA) 150 MG CAPS capsule Take 1 capsule (150 mg total) by mouth 2 (two) times daily. 07/07/15   Binnie Rail, MD  DULoxetine (CYMBALTA) 20 MG capsule TAKE 2 CAPSULES(40 MG) BY MOUTH DAILY 01/01/16   Binnie Rail, MD  finasteride (PROSCAR) 5 MG tablet Take 1 tablet (5 mg total) by mouth daily. 07/07/15   Binnie Rail, MD  fluticasone (FLONASE) 50 MCG/ACT nasal spray SHAKE LIQUID AND USE 2 SPRAYS IN EACH NOSTRIL DAILY AS NEEDED FOR ALLERGIES OR RHINITIS 08/29/15   Binnie Rail, MD  furosemide (LASIX) 40 MG  tablet Take 1 tablet (40 mg total) by mouth daily. 10/17/15   Erma Heritage, PA  hydrALAZINE (APRESOLINE) 25 MG tablet Take 1.5 tablets (37.5 mg total) by mouth every 8 (eight) hours. 10/17/15   Erma Heritage, PA  HYDROcodone-acetaminophen (NORCO) 7.5-325 MG tablet Take 1 tablet by mouth every 6 (six) hours as needed for moderate pain.    Historical Provider, MD  isosorbide mononitrate (IMDUR) 30 MG 24 hr tablet Take 30 mg by mouth daily.    Historical Provider, MD  metoprolol tartrate (LOPRESSOR) 25 MG tablet Take 1 tablet (25 mg total) by mouth 2 (two) times daily. 10/17/15   Fransisco Hertz Strader, PA  NITROSTAT 0.4 MG SL tablet Place 0.4 mg under the tongue every 5 (five) minutes x 3 doses as needed for chest pain. Reported on 10/27/2015 08/13/11   Historical Provider, MD  omeprazole (PRILOSEC) 40 MG capsule Take 1 capsule (40 mg total) by mouth daily. 08/18/15   Binnie Rail, MD  ranitidine (ZANTAC) 300 MG tablet Take 1 tablet (300 mg total) by mouth at bedtime. 08/18/15   Binnie Rail, MD  rosuvastatin (CRESTOR) 10 MG tablet Take 1 tablet (10 mg total) by mouth at bedtime. 10/17/15   Erma Heritage, PA  solifenacin (VESICARE) 5 MG tablet Take 5 mg by mouth daily. Reported on 10/27/2015    Historical Provider, MD    Current Facility-Administered Medications  Medication Dose Route Frequency Provider Last Rate Last Dose  . diltiazem (CARDIZEM) injection 10 mg  10 mg Intravenous Once Julianne Rice, MD      . glucagon (human recombinant) (GLUCAGEN) injection 1 mg  1 mg Intravenous Once Julianne Rice, MD      . sodium chloride 0.9 % bolus 500 mL  500 mL Intravenous Once Julianne Rice, MD       Current Outpatient Prescriptions  Medication Sig Dispense Refill  . dabigatran (PRADAXA) 150 MG CAPS capsule Take 1 capsule (150 mg total) by mouth 2 (two) times daily. 180 capsule 1  . DULoxetine (CYMBALTA) 20 MG capsule TAKE 2 CAPSULES(40 MG) BY MOUTH DAILY 180 capsule 1  . finasteride (PROSCAR) 5 MG  tablet Take 1 tablet (5 mg total) by mouth daily. 90 tablet 1  . fluticasone (FLONASE) 50 MCG/ACT nasal spray SHAKE LIQUID AND USE 2 SPRAYS IN EACH NOSTRIL DAILY AS NEEDED FOR ALLERGIES OR RHINITIS 16 g 1  . furosemide (LASIX) 40 MG tablet Take 1 tablet (40 mg total) by mouth daily. 30 tablet 6  . hydrALAZINE (APRESOLINE) 25 MG tablet Take 1.5 tablets (37.5 mg total) by mouth every 8 (eight) hours. 120 tablet 6  . HYDROcodone-acetaminophen (NORCO) 7.5-325 MG tablet Take 1 tablet by mouth  every 6 (six) hours as needed for moderate pain.    . isosorbide mononitrate (IMDUR) 30 MG 24 hr tablet Take 30 mg by mouth daily.    . metoprolol tartrate (LOPRESSOR) 25 MG tablet Take 1 tablet (25 mg total) by mouth 2 (two) times daily. 60 tablet 6  . NITROSTAT 0.4 MG SL tablet Place 0.4 mg under the tongue every 5 (five) minutes x 3 doses as needed for chest pain. Reported on 10/27/2015    . omeprazole (PRILOSEC) 40 MG capsule Take 1 capsule (40 mg total) by mouth daily. 30 capsule 5  . ranitidine (ZANTAC) 300 MG tablet Take 1 tablet (300 mg total) by mouth at bedtime. 90 tablet 1  . rosuvastatin (CRESTOR) 10 MG tablet Take 1 tablet (10 mg total) by mouth at bedtime. 30 tablet 6  . solifenacin (VESICARE) 5 MG tablet Take 5 mg by mouth daily. Reported on 10/27/2015      Allergies as of 02/14/2016 - Review Complete 02/14/2016  Allergen Reaction Noted  . Bactrim [sulfamethoxazole-trimethoprim] Other (See Comments) 08/27/2015  . Penicillins Itching, Swelling, and Rash 04/22/2008     Review of Systems:    This is positive for those things mentioned in the HPI All other review of systems are negative.       Physical Exam:  Vital signs in last 24 hours: Temp:  [98.8 F (37.1 C)-99.1 F (37.3 C)] 99.1 F (37.3 C) (09/02 2201) Pulse Rate:  [121-135] 121 (09/02 2241) Resp:  [20-25] 25 (09/02 2241) BP: (128-146)/(87-113) 128/89 (09/02 2241) SpO2:  [95 %-96 %] 95 % (09/02 2241)    General:   no acute  distress Eyes:  anicteric. ENT:   Mouth and posterior pharynx free of lesions.  Neck:   supple w/o thyromegaly or mass.  Lungs: Clear to auscultation bilaterally. Heart:              S1S2, irregular Abdomen:  soft, non-tender, no hepatosplenomegaly, hernia, or mass and BS+.  Lymph:  no cervical or supraclavicular adenopathy. Extremities:   no edema Neuro:  A&O x 3.  Psych:  appropriate mood and  Affect.   Data Reviewed:  STUDIES: Dg Chest 2 View  Result Date: 02/14/2016 CLINICAL DATA:  Initial evaluation for persistent spitting and cough. Status post recent smooth lodged in throat. EXAM: CHEST  2 VIEW COMPARISON:  Prior radiograph from 10/11/2015. FINDINGS: Left-sided Gregary Signs transvenous pacemaker/AICD is unchanged. Cardiomegaly is stable. Mediastinal silhouette within normal limits. No radiopaque foreign body identified. No significant dilatation of the upper esophagus appreciated. Atheromatous plaque noted within the aortic arch. Lungs normally inflated. No focal infiltrates identified. No pulmonary edema or pleural effusion. No pneumothorax. No acute osseous abnormality. Degenerative changes noted about the shoulders. IMPRESSION: 1. No active cardiopulmonary disease identified. No radiopaque foreign body identified within chest. 2. Stable cardiomegaly without pulmonary edema. 3. Aortic atherosclerosis. Electronically Signed   By: Jeannine Boga M.D.   On: 02/14/2016 05:01     PREVIOUS ENDOSCOPIES:            Most recent EGD 03/05/14 No obvious esophageal stricture dilated with 16,26mm Sav[place] with no resistance    Impression / Plan:   75 yr M here with symptoms suggestive for food impaction for >24 hours No mediastinal free air on CXR Plan for emergent disimpaction in ER The risks and benefits as well as alternatives of endoscopic procedure(s) have been discussed and reviewed. All questions answered. The patient and his son agrees to proceed. Further recommendations based on  EGD  findings  K. Denzil Magnuson , MD [phone] Mon-Fri 8a-5p [phone] after 5p, weekends, holidays  @  02/14/2016, 10:53 PM

## 2016-02-14 NOTE — Discharge Instructions (Signed)
Please be sure to schedule a follow-up appointment with your gastroenterologist.  In the interim, please monitor your dietary intake carefully, eat small bites of food, drink plenty of liquids.  Return here for concerning changes in your condition.

## 2016-02-14 NOTE — ED Notes (Signed)
Gastroenterology team at bedside.

## 2016-02-14 NOTE — ED Notes (Signed)
ED physicians notified of this pt's arrival/condition

## 2016-02-14 NOTE — ED Provider Notes (Signed)
[facility] DEPT Provider Note   CSN: [id number] Arrival date & time: 02/13/16  2310  By signing my name below, I, Dolores Hoose, attest that this documentation has been prepared under the direction and in the presence of Carmin Muskrat, MD . Electronically Signed: Dolores Hoose, Scribe. 02/14/2016. 12:11 AM.  History   Chief Complaint Chief Complaint  Patient presents with  . Foreign Body   HPI   HPI Comments:  Jacob [name] is a 82 y.o. male with PMHx of [place] who presents to the Emergency Department complaining of sudden-onset constant problems swallowing onset 4 hours ago. Pt reports that he was eating chicken when he started to choke on his food. He states that it feels like there is still some food stuck in his throat. He endorses associated drooling. Pt denies any nausea, vomiting or syncope. He reports this has happened before in the past and he was told he had some narrowing of his esophagus. Pt takes nitroglycerine daily.   Past Medical History:  Diagnosis Date  . Arthritis   . Atrial fibrillation ([place])   . Benign prostatic hypertrophy   . BPH (benign prostatic hyperplasia)   . Chronic systolic heart failure (HCC)    a. EF 25-30% in past; b.Echo 11/08/11: Normal LV wall thickness, EF 45-50%. c. Echo 04/17: EF 15-20%, diffuse akinesis, moderate MR, mod dilated LA, trivial PE   . Coronary artery disease    a. Chronically occ RCA. cardiac cath 10/2010: Mid LAD 20-30%, distal LAD 40%, mid D1 40%, mid circumflex 20-30%, distal RCA occluded with left to right collaterals. c. 10/2015: cath- relatively stable dz since 2012.    Marland Kitchen Esophageal stricture   . GERD (gastroesophageal reflux disease)   . Hemorrhoids   . History of diverticulitis of colon   . History of nephrolithiasis   . HLD (hyperlipidemia)   . HTN (hypertension)   . Ischemic cardiomyopathy    a. s/p ICD 2008   . Renal insufficiency    Renal failure in July 2012 secondary to hypotension, ARB use, and   . Skin cancer    . TIA (transient ischemic attack) 10/2011   carotids neg for ICA stenosis  . Urinary retention    foley indwelling 12/2010-04/2011    Patient Active Problem List   Diagnosis Date Noted  . Acute on chronic combined systolic and diastolic congestive heart failure ([place]) 10/14/2015  . Atrial fibrillation with RVR ([place])   . Cardiomyopathy, ischemic   . Coronary artery disease involving native heart   . Acute on chronic systolic (congestive) heart failure ([place]) 10/11/2015  . Diplopia 10/11/2015  . Depression 07/07/2015  . Esophageal obstruction due to food impaction 01/24/2014  . Syncope 02/22/2013  . Cardiomyopathy ([place]) 02/22/2013  . CAD (coronary artery disease) 02/22/2013  . GERD (gastroesophageal reflux disease) 02/22/2013  . OA (osteoarthritis) of knee 02/22/2013  . TIA (transient ischemic attack) 11/07/2011  . Urinary retention 04/13/2011  . NEVUS, ATYPICAL 08/26/2010  . ECZEMA 05/27/2010  . Atrial fibrillation ([place]) 04/29/2010  . ROTATOR CUFF INJURY, RIGHT SHOULDER 09/16/2009  . Essential hypertension 04/23/2009  . BENIGN PROSTATIC HYPERTROPHY, WITH OBSTRUCTION 04/23/2009  . ARTHRITIS 04/23/2009  . HYPERCHOLESTEROLEMIA, MIXED 04/22/2008  . CORONARY ATHEROSCLEROSIS, NATIVE VESSEL 04/22/2008  . Acute combined systolic and diastolic heart failure ([place]) 04/22/2008  . Automatic implantable cardioverter-defibrillator in situ 04/22/2008    Past Surgical History:  Procedure Laterality Date  . CARDIAC CATHETERIZATION  11-05-10  . CARDIAC CATHETERIZATION N/A 10/14/2015   Procedure: Right/Left Heart Cath  and Coronary Angiography;  Surgeon: Leonie Man, MD;  Location: [facility];  Service: Cardiovascular;  Laterality: N/A;  . CARDIAC DEFIBRILLATOR PLACEMENT  12/2006   AICD  . CATARACTS REMOVED    . EP IMPLANTABLE DEVICE N/A 11/11/2015   Procedure:  ICD Generator Changeout;  Surgeon: Evans Lance, MD;  Location: [facility];  Service: Cardiovascular;  Laterality:  N/A;  . ESOPHAGOGASTRODUODENOSCOPY N/A 01/24/2014   Procedure: ESOPHAGOGASTRODUODENOSCOPY (EGD);  Surgeon: Milus Banister, MD;  Location: [facility];  Service: Endoscopy;  Laterality: N/A;  . JOINT REPLACEMENT    . TOTAL KNEE ARTHROPLASTY  01/04/11   L TKA  . TRANSURETHRAL RESECTION OF PROSTATE  06/14/2011   Procedure: TRANSURETHRAL RESECTION OF THE PROSTATE (TURP);  Surgeon: Bernestine Amass, MD;  Location: [facility];  Service: Urology;  Laterality: N/A;       Home Medications    Prior to Admission medications   Medication Sig Start Date End Date Taking? Authorizing Provider  dabigatran (PRADAXA) 150 MG CAPS capsule Take 1 capsule (150 mg total) by mouth 2 (two) times daily. 07/07/15   Binnie Rail, MD  DULoxetine (CYMBALTA) 20 MG capsule TAKE 2 CAPSULES(40 MG) BY MOUTH DAILY 01/01/16   Binnie Rail, MD  finasteride (PROSCAR) 5 MG tablet Take 1 tablet (5 mg total) by mouth daily. 07/07/15   Binnie Rail, MD  fluticasone (FLONASE) 50 MCG/ACT nasal spray SHAKE LIQUID AND USE 2 SPRAYS IN EACH NOSTRIL DAILY AS NEEDED FOR ALLERGIES OR RHINITIS 08/29/15   Binnie Rail, MD  furosemide (LASIX) 40 MG tablet Take 1 tablet (40 mg total) by mouth daily. 10/17/15   Erma Heritage, PA  hydrALAZINE (APRESOLINE) 25 MG tablet Take 1.5 tablets (37.5 mg total) by mouth every 8 (eight) hours. 10/17/15   Erma Heritage, PA  HYDROcodone-acetaminophen (NORCO) 7.5-325 MG tablet Take 1 tablet by mouth every 6 (six) hours as needed for moderate pain.    Historical Provider, MD  isosorbide mononitrate (IMDUR) 30 MG 24 hr tablet Take 30 mg by mouth daily.    Historical Provider, MD  metoprolol tartrate (LOPRESSOR) 25 MG tablet Take 1 tablet (25 mg total) by mouth 2 (two) times daily. 10/17/15   Fransisco Hertz Strader, PA  NITROSTAT 0.4 MG SL tablet Place 0.4 mg under the tongue every 5 (five) minutes x 3 doses as needed for chest pain. Reported on 10/27/2015 08/13/11   Historical Provider, MD  omeprazole (PRILOSEC) 40 MG  capsule Take 1 capsule (40 mg total) by mouth daily. 08/18/15   Binnie Rail, MD  ranitidine (ZANTAC) 300 MG tablet Take 1 tablet (300 mg total) by mouth at bedtime. 08/18/15   Binnie Rail, MD  rosuvastatin (CRESTOR) 10 MG tablet Take 1 tablet (10 mg total) by mouth at bedtime. 10/17/15   Erma Heritage, PA  solifenacin (VESICARE) 5 MG tablet Take 5 mg by mouth daily. Reported on 10/27/2015    Historical Provider, MD    Family History Family History  Problem Relation Age of Onset  . Heart attack Mother 55    pt unsure of age  . Liver disease Father 69  . Heart disease Son     x 2  . Colon cancer Neg Hx     Social History Social History  Substance Use Topics  . Smoking status: Former Smoker    Packs/day: 2.00    Years: 30.00    Quit date: 06/14/1968  . Smokeless tobacco:  Never Used  . Alcohol use No     Allergies   Bactrim [sulfamethoxazole-trimethoprim] and Penicillins   Review of Systems Review of Systems  Constitutional:       Per HPI, otherwise negative  HENT:       Per HPI, otherwise negative  Respiratory:       Per HPI, otherwise negative  Cardiovascular:       Per HPI, otherwise negative  Gastrointestinal: Negative for vomiting.  Endocrine:       Negative aside from HPI  Genitourinary:       Neg aside from HPI   Musculoskeletal:       Per HPI, otherwise negative  Skin: Negative.   Neurological: Negative for syncope.     Physical Exam Updated Vital Signs BP 181/87 (BP Location: Right Arm)   Pulse 102   Temp 99.7 F (37.6 C) (Oral)   Resp 18   SpO2 100%   Physical Exam  Constitutional: He is oriented to person, place, and time. He appears well-developed. No distress.  HENT:  Head: Normocephalic and atraumatic.  Eyes: Conjunctivae and EOM are normal.  Cardiovascular: Normal rate and regular rhythm.   Pulmonary/Chest: Effort normal. No stridor. No respiratory distress.  Abdominal: He exhibits no distension.  Musculoskeletal: He exhibits no  edema.  Neurological: He is alert and oriented to person, place, and time.  Skin: Skin is warm and dry.  Psychiatric: He has a normal mood and affect.  Nursing note and vitals reviewed.   Chart review notable for GI visit in the distant past, with notation that the patient was not a good candidate for further endoscopy given his multiple other medical issues.  ED Treatments / Results  DIAGNOSTIC STUDIES:  Oxygen Saturation is 100% on RA, normal by my interpretation.    COORDINATION OF CARE:  12:16 AM Discussed treatment plan with pt at bedside which included foreign body cocktail and pt agreed to plan.   Procedures Procedures (including critical care time)  Medications Ordered in ED Medications - No data to display   Initial Impression / Assessment and Plan / ED Course  I have reviewed the triage vital signs and the nursing notes.  Pertinent labs & imaging results that were available during my care of the patient were reviewed by me and considered in my medical decision making (see chart for details).  Clinical Course  Comment By Time  Patient successfully vomited chicken bits, and subsequently tolerated PO intake w no emesis. Carmin Muskrat, MD 09/02 0302      Final Clinical Impressions(s) / ED Diagnoses   I personally performed the services described in this documentation, which was scribed in my presence. The recorded information has been reviewed and is accurate.    Patient presents with retained esophageal foreign body. After multiple medications were provided, patient eventually had capacity to tolerate oral intake, without vomiting. Patient discharged in stable condition to follow-up with GI.    Carmin Muskrat, MD 02/14/16 [phone]

## 2016-02-15 ENCOUNTER — Encounter (HOSPITAL_COMMUNITY): Payer: Self-pay | Admitting: Family Medicine

## 2016-02-15 DIAGNOSIS — I2510 Atherosclerotic heart disease of native coronary artery without angina pectoris: Secondary | ICD-10-CM

## 2016-02-15 DIAGNOSIS — Z87442 Personal history of urinary calculi: Secondary | ICD-10-CM | POA: Diagnosis not present

## 2016-02-15 DIAGNOSIS — K219 Gastro-esophageal reflux disease without esophagitis: Secondary | ICD-10-CM | POA: Diagnosis present

## 2016-02-15 DIAGNOSIS — K2970 Gastritis, unspecified, without bleeding: Secondary | ICD-10-CM | POA: Diagnosis present

## 2016-02-15 DIAGNOSIS — Z95810 Presence of automatic (implantable) cardiac defibrillator: Secondary | ICD-10-CM | POA: Diagnosis not present

## 2016-02-15 DIAGNOSIS — T18128A Food in esophagus causing other injury, initial encounter: Secondary | ICD-10-CM | POA: Diagnosis present

## 2016-02-15 DIAGNOSIS — N183 Chronic kidney disease, stage 3 unspecified: Secondary | ICD-10-CM | POA: Diagnosis present

## 2016-02-15 DIAGNOSIS — K2211 Ulcer of esophagus with bleeding: Secondary | ICD-10-CM | POA: Diagnosis present

## 2016-02-15 DIAGNOSIS — I482 Chronic atrial fibrillation: Secondary | ICD-10-CM | POA: Diagnosis present

## 2016-02-15 DIAGNOSIS — I10 Essential (primary) hypertension: Secondary | ICD-10-CM

## 2016-02-15 DIAGNOSIS — J690 Pneumonitis due to inhalation of food and vomit: Secondary | ICD-10-CM | POA: Diagnosis not present

## 2016-02-15 DIAGNOSIS — F329 Major depressive disorder, single episode, unspecified: Secondary | ICD-10-CM | POA: Diagnosis present

## 2016-02-15 DIAGNOSIS — I5022 Chronic systolic (congestive) heart failure: Secondary | ICD-10-CM | POA: Diagnosis present

## 2016-02-15 DIAGNOSIS — I4891 Unspecified atrial fibrillation: Secondary | ICD-10-CM

## 2016-02-15 DIAGNOSIS — N401 Enlarged prostate with lower urinary tract symptoms: Secondary | ICD-10-CM | POA: Diagnosis present

## 2016-02-15 DIAGNOSIS — R918 Other nonspecific abnormal finding of lung field: Secondary | ICD-10-CM | POA: Diagnosis not present

## 2016-02-15 DIAGNOSIS — Z96652 Presence of left artificial knee joint: Secondary | ICD-10-CM | POA: Diagnosis present

## 2016-02-15 DIAGNOSIS — I255 Ischemic cardiomyopathy: Secondary | ICD-10-CM | POA: Diagnosis present

## 2016-02-15 DIAGNOSIS — E870 Hyperosmolality and hypernatremia: Secondary | ICD-10-CM | POA: Diagnosis present

## 2016-02-15 DIAGNOSIS — K254 Chronic or unspecified gastric ulcer with hemorrhage: Secondary | ICD-10-CM | POA: Diagnosis present

## 2016-02-15 DIAGNOSIS — Z85828 Personal history of other malignant neoplasm of skin: Secondary | ICD-10-CM | POA: Diagnosis not present

## 2016-02-15 DIAGNOSIS — E860 Dehydration: Secondary | ICD-10-CM | POA: Diagnosis present

## 2016-02-15 DIAGNOSIS — I130 Hypertensive heart and chronic kidney disease with heart failure and stage 1 through stage 4 chronic kidney disease, or unspecified chronic kidney disease: Secondary | ICD-10-CM | POA: Diagnosis present

## 2016-02-15 DIAGNOSIS — Z87891 Personal history of nicotine dependence: Secondary | ICD-10-CM | POA: Diagnosis not present

## 2016-02-15 DIAGNOSIS — X58XXXA Exposure to other specified factors, initial encounter: Secondary | ICD-10-CM | POA: Diagnosis present

## 2016-02-15 DIAGNOSIS — Z8673 Personal history of transient ischemic attack (TIA), and cerebral infarction without residual deficits: Secondary | ICD-10-CM | POA: Diagnosis not present

## 2016-02-15 DIAGNOSIS — E785 Hyperlipidemia, unspecified: Secondary | ICD-10-CM | POA: Diagnosis present

## 2016-02-15 DIAGNOSIS — N3281 Overactive bladder: Secondary | ICD-10-CM | POA: Diagnosis present

## 2016-02-15 LAB — COMPREHENSIVE METABOLIC PANEL
ALT: 12 U/L — AB (ref 17–63)
ANION GAP: 6 (ref 5–15)
AST: 19 U/L (ref 15–41)
Albumin: 3.4 g/dL — ABNORMAL LOW (ref 3.5–5.0)
Alkaline Phosphatase: 55 U/L (ref 38–126)
BUN: 26 mg/dL — ABNORMAL HIGH (ref 6–20)
CHLORIDE: 111 mmol/L (ref 101–111)
CO2: 27 mmol/L (ref 22–32)
Calcium: 8.6 mg/dL — ABNORMAL LOW (ref 8.9–10.3)
Creatinine, Ser: 1.57 mg/dL — ABNORMAL HIGH (ref 0.61–1.24)
GFR calc non Af Amer: 39 mL/min — ABNORMAL LOW (ref >60)
GFR, EST AFRICAN AMERICAN: 46 mL/min — AB (ref >60)
Glucose, Bld: 94 mg/dL (ref 65–99)
POTASSIUM: 4 mmol/L (ref 3.5–5.1)
SODIUM: 144 mmol/L (ref 135–145)
Total Bilirubin: 1.4 mg/dL — ABNORMAL HIGH (ref 0.3–1.2)
Total Protein: 6.8 g/dL (ref 6.5–8.1)

## 2016-02-15 LAB — CBC
HEMATOCRIT: 33.6 % — AB (ref 39.0–52.0)
HEMOGLOBIN: 10.9 g/dL — AB (ref 13.0–17.0)
MCH: 29.4 pg (ref 26.0–34.0)
MCHC: 32.4 g/dL (ref 30.0–36.0)
MCV: 90.6 fL (ref 78.0–100.0)
Platelets: 144 10*3/uL — ABNORMAL LOW (ref 150–400)
RBC: 3.71 MIL/uL — AB (ref 4.22–5.81)
RDW: 16 % — ABNORMAL HIGH (ref 11.5–15.5)
WBC: 14.2 10*3/uL — ABNORMAL HIGH (ref 4.0–10.5)

## 2016-02-15 MED ORDER — SODIUM CHLORIDE 0.9% FLUSH
3.0000 mL | Freq: Two times a day (BID) | INTRAVENOUS | Status: DC
  Administered 2016-02-15 – 2016-02-16 (×3): 3 mL via INTRAVENOUS

## 2016-02-15 MED ORDER — DABIGATRAN ETEXILATE MESYLATE 150 MG PO CAPS
150.0000 mg | ORAL_CAPSULE | Freq: Two times a day (BID) | ORAL | Status: DC
  Administered 2016-02-15 – 2016-02-16 (×4): 150 mg via ORAL
  Filled 2016-02-15 (×4): qty 1

## 2016-02-15 MED ORDER — METRONIDAZOLE IN NACL 5-0.79 MG/ML-% IV SOLN
500.0000 mg | Freq: Three times a day (TID) | INTRAVENOUS | Status: DC
  Administered 2016-02-15 – 2016-02-16 (×3): 500 mg via INTRAVENOUS
  Filled 2016-02-15 (×3): qty 100

## 2016-02-15 MED ORDER — ACETAMINOPHEN 325 MG PO TABS
650.0000 mg | ORAL_TABLET | Freq: Four times a day (QID) | ORAL | Status: DC | PRN
  Administered 2016-02-16 (×2): 650 mg via ORAL
  Filled 2016-02-15 (×2): qty 2

## 2016-02-15 MED ORDER — ONDANSETRON HCL 4 MG PO TABS: 4.0000 mg | ORAL_TABLET | Freq: Four times a day (QID) | ORAL | Status: DC | PRN

## 2016-02-15 MED ORDER — DULOXETINE HCL 20 MG PO CPEP
40.0000 mg | ORAL_CAPSULE | Freq: Every day | ORAL | Status: DC
  Administered 2016-02-15 – 2016-02-16 (×2): 40 mg via ORAL
  Filled 2016-02-15 (×2): qty 2

## 2016-02-15 MED ORDER — DARIFENACIN HYDROBROMIDE ER 7.5 MG PO TB24
7.5000 mg | ORAL_TABLET | Freq: Every day | ORAL | Status: DC
  Administered 2016-02-15 – 2016-02-16 (×2): 7.5 mg via ORAL
  Filled 2016-02-15 (×2): qty 1

## 2016-02-15 MED ORDER — SODIUM CHLORIDE 0.9 % IV SOLN: INTRAVENOUS | Status: DC

## 2016-02-15 MED ORDER — LEVOFLOXACIN IN D5W 750 MG/150ML IV SOLN
750.0000 mg | Freq: Once | INTRAVENOUS | Status: AC
  Administered 2016-02-15: 750 mg via INTRAVENOUS
  Filled 2016-02-15: qty 150

## 2016-02-15 MED ORDER — SUCRALFATE 1 GM/10ML PO SUSP
1.0000 g | Freq: Three times a day (TID) | ORAL | Status: DC
  Administered 2016-02-15 – 2016-02-16 (×6): 1 g via ORAL
  Filled 2016-02-15 (×6): qty 10

## 2016-02-15 MED ORDER — METOPROLOL TARTRATE 25 MG PO TABS
25.0000 mg | ORAL_TABLET | Freq: Two times a day (BID) | ORAL | Status: DC
  Administered 2016-02-15 – 2016-02-16 (×4): 25 mg via ORAL
  Filled 2016-02-15 (×4): qty 1

## 2016-02-15 MED ORDER — ONDANSETRON HCL 4 MG/2ML IJ SOLN: 4.0000 mg | Freq: Four times a day (QID) | INTRAMUSCULAR | Status: DC | PRN

## 2016-02-15 MED ORDER — FAMOTIDINE 20 MG PO TABS
20.0000 mg | ORAL_TABLET | Freq: Every day | ORAL | Status: DC
  Administered 2016-02-15 – 2016-02-16 (×2): 20 mg via ORAL
  Filled 2016-02-15 (×2): qty 1

## 2016-02-15 MED ORDER — FINASTERIDE 5 MG PO TABS
5.0000 mg | ORAL_TABLET | Freq: Every day | ORAL | Status: DC
  Administered 2016-02-15 – 2016-02-16 (×2): 5 mg via ORAL
  Filled 2016-02-15 (×2): qty 1

## 2016-02-15 MED ORDER — SODIUM CHLORIDE 0.9 % IV SOLN
INTRAVENOUS | Status: DC
  Administered 2016-02-15: 02:00:00 via INTRAVENOUS

## 2016-02-15 MED ORDER — LEVOFLOXACIN IN D5W 750 MG/150ML IV SOLN: 750.0000 mg | INTRAVENOUS | Status: DC

## 2016-02-15 MED ORDER — PANTOPRAZOLE SODIUM 40 MG PO TBEC
40.0000 mg | DELAYED_RELEASE_TABLET | Freq: Every day | ORAL | Status: DC
  Administered 2016-02-15 – 2016-02-16 (×2): 40 mg via ORAL
  Filled 2016-02-15 (×2): qty 1

## 2016-02-15 MED ORDER — ENOXAPARIN SODIUM 30 MG/0.3ML ~~LOC~~ SOLN: 30.0000 mg | SUBCUTANEOUS | Status: DC

## 2016-02-15 MED ORDER — ROSUVASTATIN CALCIUM 10 MG PO TABS
10.0000 mg | ORAL_TABLET | Freq: Every day | ORAL | Status: DC
  Administered 2016-02-15: 10 mg via ORAL
  Filled 2016-02-15: qty 1

## 2016-02-15 MED ORDER — ACETAMINOPHEN 650 MG RE SUPP: 650.0000 mg | Freq: Four times a day (QID) | RECTAL | Status: DC | PRN

## 2016-02-15 NOTE — H&P (Signed)
History and Physical  Patient Name: Jacob [name]     [id number]    DOB: July 19, 1931    DOA: 02/14/2016 PCP: Binnie Rail, MD   Patient coming from: Home  Chief Complaint: Food impaction and cough  HPI: Jacob [name] is a 82 y.o. male with a past medical history significant for CAD and ischemic CM with ICD EF 15-20%, CKD III baseline Cr 1.4-1.6, Afib on dabigatran and esophageal strictures who presents with food impaction and cough.  The patient was in his usual health until last night he was eating chicken when he felt like he was choking or that food was stuck in her throat. He presented to the ER where he was given glucagon, subsequently vomited some bits of chicken, and per EDP note was able to tolerate oral intake and was discharged.  On arriving home however, the patient still felt food stuck in his throat, all night and day couldn't tolerate his secretions, was coughing, and felt weak, so he came back to the ER.  ED course: -Temp 90 9.48F, heart rate 134, respirations 20, hypertensive, pulse ox symmetry normal -Na 146, K 4.2, Cr 1.7 (baseline 1.4 1.6), WBC 16 K, Hgb 14, troponin negative -CXR showed a new left base opacity -ECG showed atrial fibrillation with rate 125 -GI were consulted, and lower GI/Dr. Silverio Decamp performed a bedside endoscopy and pushed the retained foot into the stomach, after which his symptoms resolved  -He was given diltiazem bolus and his heart rate improved, and subsequently given Levaquin and a 500 mL bolus and TRH were asked to evaluate for admission      ROS: Review of Systems  Constitutional: Negative for chills, fever and malaise/fatigue.  Respiratory: Positive for cough. Negative for hemoptysis, sputum production and shortness of breath.   Cardiovascular: Negative for chest pain, palpitations and leg swelling.  Gastrointestinal: Positive for nausea.  All other systems reviewed and are negative.         Past Medical History:  Diagnosis Date    . Arthritis   . Atrial fibrillation ([place])   . Benign prostatic hypertrophy   . BPH (benign prostatic hyperplasia)   . Chronic systolic heart failure (HCC)    a. EF 25-30% in past; b.Echo 11/08/11: Normal LV wall thickness, EF 45-50%. c. Echo 04/17: EF 15-20%, diffuse akinesis, moderate MR, mod dilated LA, trivial PE   . Coronary artery disease    a. Chronically occ RCA. cardiac cath 10/2010: Mid LAD 20-30%, distal LAD 40%, mid D1 40%, mid circumflex 20-30%, distal RCA occluded with left to right collaterals. c. 10/2015: cath- relatively stable dz since 2012.    Marland Kitchen Esophageal stricture   . GERD (gastroesophageal reflux disease)   . Hemorrhoids   . History of diverticulitis of colon   . History of nephrolithiasis   . HLD (hyperlipidemia)   . HTN (hypertension)   . Ischemic cardiomyopathy    a. s/p ICD 2008   . Renal insufficiency    Renal failure in July 2012 secondary to hypotension, ARB use, and   . Skin cancer   . TIA (transient ischemic attack) 10/2011   carotids neg for ICA stenosis  . Urinary retention    foley indwelling 12/2010-04/2011    Past Surgical History:  Procedure Laterality Date  . CARDIAC CATHETERIZATION  11-05-10  . CARDIAC CATHETERIZATION N/A 10/14/2015   Procedure: Right/Left Heart Cath and Coronary Angiography;  Surgeon: Leonie Man, MD;  Location: [facility];  Service: Cardiovascular;  Laterality: N/A;  . CARDIAC DEFIBRILLATOR PLACEMENT  12/2006   AICD  . CATARACTS REMOVED    . EP IMPLANTABLE DEVICE N/A 11/11/2015   Procedure:  ICD Generator Changeout;  Surgeon: Evans Lance, MD;  Location: [facility];  Service: Cardiovascular;  Laterality: N/A;  . ESOPHAGOGASTRODUODENOSCOPY N/A 01/24/2014   Procedure: ESOPHAGOGASTRODUODENOSCOPY (EGD);  Surgeon: Milus Banister, MD;  Location: [facility];  Service: Endoscopy;  Laterality: N/A;  . JOINT REPLACEMENT    . TOTAL KNEE ARTHROPLASTY  01/04/11   L TKA  . TRANSURETHRAL RESECTION OF PROSTATE   06/14/2011   Procedure: TRANSURETHRAL RESECTION OF THE PROSTATE (TURP);  Surgeon: Bernestine Amass, MD;  Location: [facility];  Service: Urology;  Laterality: N/A;    Social History: Patient lives with his daughter. He  reports that he quit smoking about 47 years ago. He has a 60.00 pack-year smoking history. He has never used smokeless tobacco. He reports that he does not drink alcohol or use drugs.  Allergies  Allergen Reactions  . Bactrim [Sulfamethoxazole-Trimethoprim] Other (See Comments)    Throat swelling  . Penicillins Itching, Swelling and Rash    Has patient had a PCN reaction causing immediate rash, facial/tongue/throat swelling, SOB or lightheadedness with hypotension:unsure Has patient had a PCN reaction causing severe rash involving mucus membranes or skin necrosis:No Has patient had a PCN reaction that required hospitalization:No Has patient had a PCN reaction occurring within the last 10 years:No If all of the above answers are "NO", then may proceed with Cephalosporin use.     Family history: family history includes Heart attack (age of onset: 74) in his mother; Heart disease in his son; Liver disease (age of onset: 56) in his father.  Prior to Admission medications   Medication Sig Start Date End Date Taking? Authorizing Provider  dabigatran (PRADAXA) 150 MG CAPS capsule Take 1 capsule (150 mg total) by mouth 2 (two) times daily. 07/07/15  Yes Binnie Rail, MD  DULoxetine (CYMBALTA) 20 MG capsule TAKE 2 CAPSULES(40 MG) BY MOUTH DAILY 01/01/16  Yes Binnie Rail, MD  finasteride (PROSCAR) 5 MG tablet Take 1 tablet (5 mg total) by mouth daily. 07/07/15  Yes Binnie Rail, MD  fluticasone (FLONASE) 50 MCG/ACT nasal spray SHAKE LIQUID AND USE 2 SPRAYS IN EACH NOSTRIL DAILY AS NEEDED FOR ALLERGIES OR RHINITIS 08/29/15  Yes Binnie Rail, MD  furosemide (LASIX) 40 MG tablet Take 1 tablet (40 mg total) by mouth daily. 10/17/15  Yes Erma Heritage, PA  hydrALAZINE (APRESOLINE) 25 MG  tablet Take 1.5 tablets (37.5 mg total) by mouth every 8 (eight) hours. 10/17/15  Yes Erma Heritage, PA  HYDROcodone-acetaminophen (NORCO) 7.5-325 MG tablet Take 1 tablet by mouth every 6 (six) hours as needed for moderate pain.   Yes Historical Provider, MD  isosorbide mononitrate (IMDUR) 30 MG 24 hr tablet Take 30 mg by mouth daily.   Yes Historical Provider, MD  metoprolol tartrate (LOPRESSOR) 25 MG tablet Take 1 tablet (25 mg total) by mouth 2 (two) times daily. 10/17/15  Yes [place], PA  NITROSTAT 0.4 MG SL tablet Place 0.4 mg under the tongue every 5 (five) minutes x 3 doses as needed for chest pain. Reported on 10/27/2015 08/13/11  Yes Historical Provider, MD  omeprazole (PRILOSEC) 40 MG capsule Take 1 capsule (40 mg total) by mouth daily. 08/18/15  Yes Binnie Rail, MD  ranitidine (ZANTAC) 300 MG tablet Take 1 tablet (300 mg total) by  mouth at bedtime. 08/18/15  Yes Binnie Rail, MD  rosuvastatin (CRESTOR) 10 MG tablet Take 1 tablet (10 mg total) by mouth at bedtime. 10/17/15  Yes Erma Heritage, PA  solifenacin (VESICARE) 5 MG tablet Take 5 mg by mouth daily. Reported on 10/27/2015   Yes Historical Provider, MD       Physical Exam: BP 126/83 (BP Location: Right Arm)   Pulse 84   Temp 98.4 F (36.9 C) (Oral)   Resp (!) 24   Ht 5\' 8"  (1.727 m)   Wt 70.1 kg (154 lb 9.6 oz)   SpO2 96%   BMI 23.51 kg/m  General appearance: Well-developed, elderly adult male, alert and in no acute distress.  Hard of hearing.   Eyes: Anicteric, conjunctiva pink, lids and lashes normal.     ENT: No nasal deformity, discharge, or epistaxis.  OP moist without lesions.   Lymph: No cervical or supraclavicular lymphadenopathy. Skin: Warm and dry.  No suspicious rashes or lesions. Cardiac: Mild tachycardia, irregular, nl S1-S2, no murmurs appreciated.  Capillary refill is brisk.  JVP normal.  No LE edema.  Radial pulses 2+ and symmetric.  DP pulses diminshed bilaterally. Respiratory: Normal  respiratory rate and rhythm.  No wheezes. Breath sounds diminished and rales at left base. GI: Abdomen soft without rigidity.  No TTP. No ascites, distension, hepatosplenomegaly.   MSK: No deformities or effusions.  No clubbing/cyanosis. Neuro: Cranial nerves normal. Sensorium intact and responding to questions, attention normal.  Speech is fluent.  Moves all extremities equally and with normal coordination.    Psych: Affect normal.  Judgment and insight appear normal.       Labs on Admission:  I have personally reviewed following labs and imaging studies: CBC:  Recent Labs Lab 02/14/16 2312  WBC 16.0*  NEUTROABS 14.4*  HGB 14.1  HCT 41.7  MCV 88.2  PLT 123456*   Basic Metabolic Panel:  Recent Labs Lab 02/14/16 2312  NA 146*  K 4.2  CL 111  CO2 26  GLUCOSE 153*  BUN 23*  CREATININE 1.67*  CALCIUM 9.2   GFR: Estimated Creatinine Clearance: 33 mL/min (by C-G formula based on SCr of 1.67 mg/dL).  Liver Function Tests:  Recent Labs Lab 02/14/16 2312  AST 22  ALT 13*  ALKPHOS 69  BILITOT 1.6*  PROT 8.3*  ALBUMIN 4.2   No results for input(s): LIPASE, AMYLASE in the last 168 hours. No results for input(s): AMMONIA in the last 168 hours. Coagulation Profile: No results for input(s): INR, PROTIME in the last 168 hours. Cardiac Enzymes: No results for input(s): CKTOTAL, CKMB, CKMBINDEX, TROPONINI in the last 168 hours. BNP (last 3 results) No results for input(s): PROBNP in the last 8760 hours. HbA1C: No results for input(s): HGBA1C in the last 72 hours. CBG: No results for input(s): GLUCAP in the last 168 hours. Lipid Profile: No results for input(s): CHOL, HDL, LDLCALC, TRIG, CHOLHDL, LDLDIRECT in the last 72 hours. Thyroid Function Tests: No results for input(s): TSH, T4TOTAL, FREET4, T3FREE, THYROIDAB in the last 72 hours. Anemia Panel: No results for input(s): VITAMINB12, FOLATE, FERRITIN, TIBC, IRON, RETICCTPCT in the last 72 hours. Sepsis Labs: Invalid  input(s): PROCALCITONIN, LACTICIDVEN No results found for this or any previous visit (from the past 240 hour(s)).       Radiological Exams on Admission: Personally reviewed: Dg Chest 2 View  Result Date: 02/14/2016 CLINICAL DATA:  Initial evaluation for persistent spitting and cough. Status post recent smooth lodged in throat.  EXAM: CHEST  2 VIEW COMPARISON:  Prior radiograph from 10/11/2015. FINDINGS: Left-sided Gregary Signs transvenous pacemaker/AICD is unchanged. Cardiomegaly is stable. Mediastinal silhouette within normal limits. No radiopaque foreign body identified. No significant dilatation of the upper esophagus appreciated. Atheromatous plaque noted within the aortic arch. Lungs normally inflated. No focal infiltrates identified. No pulmonary edema or pleural effusion. No pneumothorax. No acute osseous abnormality. Degenerative changes noted about the shoulders. IMPRESSION: 1. No active cardiopulmonary disease identified. No radiopaque foreign body identified within chest. 2. Stable cardiomegaly without pulmonary edema. 3. Aortic atherosclerosis. Electronically Signed   By: Jeannine Boga M.D.   On: 02/14/2016 05:01   Dg Chest Port 1 View  Result Date: 02/14/2016 CLINICAL DATA:  Unable to swallow anything for 2 days. Initial encounter. EXAM: PORTABLE CHEST 1 VIEW COMPARISON:  Chest radiograph performed earlier today at 4:40 a.m. FINDINGS: The lungs are well-aerated. Mild left basilar opacity may reflect pneumonia. There is no evidence of pleural effusion or pneumothorax. The cardiomediastinal silhouette is borderline enlarged. A pacemaker/AICD is noted overlying the left chest wall, with leads ending overlying the right atrium and right ventricle. No acute osseous abnormalities are seen. IMPRESSION: Mild left basilar airspace opacity may reflect pneumonia. Electronically Signed   By: Garald Balding M.D.   On: 02/14/2016 23:35    EKG: Independently reviewed. Rate 125, atrial fibrillation.       Assessment/Plan Principal Problem:   Aspiration pneumonitis (HCC) Active Problems:   Essential hypertension   Atrial fibrillation with RVR (HCC)   Coronary artery disease involving native heart   Food impaction of esophagus   Hypernatremia   CKD (chronic kidney disease), stage III  1. Aspiration pneumonitis:  New left base opacity from yesterday.  Likely aspiration in setting of food impaction.  Given age, CHF with EF 15%, atrial fibrillation, will start antibiotics now and monitor clinically.   -Levofloxacin IV, dosed per pharmacy -Ondansetron for nausea -Supplemental O2 as needed -Incentive spirometry   2. Food impaction and esophagitis:  Patient noted to have some erosive esophagitis and gastritis on endoscopy. -Mechanical soft diet and only ground meat. -Protonix 40 mg PO daily. -Sucralfate 1g PO QID for 2 weeks.   3. Chronic systolic CHF:  EF 0000000.  ICD in place.  Euvolemic. -Hold Imdur, hydralazine until hemodynamics clearer -Hold furosemide tomorrow while giving fluids -Continue metoprolol -Continue statin  4. Atrial fibrillation:  CHADS2Vasc 5.  Rate controlled with IV diltiazem in ER. -Continue home metoprolol -Continue dabigatran  5. GERD:  -Continue PPI and H2RA  6. BPH and overactive bladder:  -Continue finasteride and Vesicare  7. Depression:  -Continue duloxetine  8. Hypernatremia: Presumed dehydration insetting of impaction. -Repeat BMP         DVT prophylaxis: On dabigatran  Code Status: Partial (DO NOT CPR, intubation okay, has ICD)  Family Communication: None present  Disposition Plan: Anticipate IV antibiotics and monitor renal function, hypernatremia, leukocytosis.  If heart rate resolves, and other parameters improving, discharge in 2-3 days. Consults called: None Admission status: INPATIENT, telemetry     Medical decision making: Patient seen at 1:30 AM on 02/15/2016.  The patient was discussed with Dr. Lita Mains. What  exists of the patient's chart was reviewed in depth.        Edwin Dada [facility] Pager [phone]    At the time of admission, it appears that the appropriate admission status for this patient is INPATIENT. This is judged to be reasonable and necessary in order to provide the required intensity  of service to ensure the patient's safety given the presenting symptoms, physical exam findings, and initial radiographic and laboratory data in the context of their chronic comorbidities.  Together, these circumstances are felt to place her/him at high risk for further clinical deterioration threatening life, limb, or organ. The following factors support the admission status of inpatient:   A. The patient's presenting symptoms include cough, sense of choking, inability to swallow. B. The worrisome physical exam findings include tachycardia, low grade fever. C. The initial radiographic and laboratory data are worrisome because of new pneumonitis vs pneumonia, leukocytosis. D. The chronic co-morbidities include CHF with low EF, atrial fibrillation, CKD. E. Patient requires inpatient status due to high intensity of service, high risk for further deterioration and high frequency of surveillance required. F. I certify that at the point of admission it is my clinical judgment that the patient will require inpatient hospital care spanning beyond 2 midnights from the point of admission.

## 2016-02-15 NOTE — Progress Notes (Signed)
Pt admitted after midnight, now seen at bedside, please see Dr. Loleta Books admission note, pt admitted for evaluation of food impaction and cough, aspiration pneumonitis. Continue Levaquin and added Flagyl for anaerobic coverage. CBC and BMP in AM.   Faye Ramsay, MD  [facility] Pager [phone]  If 7PM-7AM, please contact night-coverage [email or web address] Password [email or web address]

## 2016-02-15 NOTE — Op Note (Signed)
[facility] Patient Name: Jacob [name] Procedure Date: 02/14/2016 MRN: [id number] Attending MD: Mauri Pole , MD Date of Birth: January 26, 1932 CSN: [id number] Age: 82 Admit Type: Inpatient Procedure:                Upper GI endoscopy Indications:              Removal of foreign body in the esophagus Providers:                Mauri Pole, MD, Malka So, RN,                            Cherylynn Ridges, Technician Referring MD:              Medicines:                Sedation Administered by an Endoscopy Nurse,                            Fentanyl 50 micrograms IV, Midazolam 3 mg IV Complications:            No immediate complications. Estimated Blood Loss:     Estimated blood loss was minimal. Procedure:                Pre-Anesthesia Assessment:                           - Prior to the procedure, a History and Physical                            was performed, and patient medications and                            allergies were reviewed. The patient's tolerance of                            previous anesthesia was also reviewed. The risks                            and benefits of the procedure and the sedation                            options and risks were discussed with the patient.                            All questions were answered, and informed consent                            was obtained. Prior Anticoagulants: The patient                            last took Pradaxa (dabigatran) 1 day prior to the                            procedure. ASA Grade Assessment: IV - A patient  with severe systemic disease that is a constant                            threat to life. After reviewing the risks and                            benefits, the patient was deemed in satisfactory                            condition to undergo the procedure.                           After obtaining informed consent, the endoscope was                passed under direct vision. Throughout the                            procedure, the patient's blood pressure, pulse, and                            oxygen saturations were monitored continuously. The                            [id number] [phone]) scope was introduced through the                            mouth, and advanced to the prepyloric region,                            stomach. The upper GI endoscopy was accomplished                            without difficulty. The patient tolerated the                            procedure well. Scope In: Scope Out: Findings:      Food was found in the lower third of the esophagus. Attempted removal of       food bolus with roth net but unable to grasp, was shredding to small       pieces, given the food bolus (chicken) was there for >24 hrs, able to       push it down into stomach with very gentle pressure.      LA Grade D (one or more mucosal breaks involving at least 75% of       esophageal circumference) esophagitis with bleeding was found in the       lower third of esophagus      A few localized, less than 5 mm bleeding erosions were found in the       gastric antrum. There were stigmata of recent bleeding. Impression:               - Food in the lower third of the esophagus. Removal                            was successful.                           -  LA Grade D erosive esophagitis.                           - Bleeding erosive gastropathy. Moderate Sedation:      Moderate (conscious) sedation was administered by the endoscopy nurse       and supervised by the endoscopist. The following parameters were       monitored: oxygen saturation, heart rate, blood pressure, and response       to care. Total physician intraservice time was 20 minutes. Recommendation:           - Mechanical soft diet and only ground meat.                           - Continue present medications.                           - No ibuprofen, naproxen, or  other non-steroidal                            anti-inflammatory drugs.                           - Resume Pradaxa (dabigatran) at prior dose                            tomorrow. Refer to managing physician for further                            adjustment of therapy.                           - Use Protonix (pantoprazole) 40 mg PO daily.                           - Use sucralfate suspension 1 gram PO QID for 2                            weeks.                           - Discharge planning as per ER physcian pending                            other issues Procedure Code(s):        --- Professional ---                           [phone], 52, Esophagogastroduodenoscopy, flexible,                            transoral; with removal of foreign body(s)                           G0500, Moderate sedation services provided by the  same physician or other qualified health care                            professional performing a gastrointestinal                            endoscopic service that sedation supports,                            requiring the presence of an independent trained                            observer to assist in the monitoring of the                            patient's level of consciousness and physiological                            status; initial 15 minutes of intra-service time;                            patient age 44 years or older (additional time may                            be reported with [phone], as appropriate) Diagnosis Code(s):        --- Professional ---                           K20.8, Other esophagitis                           T18.128A, Food in esophagus causing other injury,                            initial encounter                           K31.89, Other diseases of stomach and duodenum                           K92.2, Gastrointestinal hemorrhage, unspecified                           T18.108A, Unspecified foreign body in  esophagus                            causing other injury, initial encounter CPT copyright 2016 [employer]. All rights reserved. The codes documented in this report are preliminary and upon coder review may  be revised to meet current compliance requirements. Mauri Pole, MD 02/14/2016 11:59:59 PM This report has been signed electronically. Number of Addenda: 0

## 2016-02-15 NOTE — ED Provider Notes (Signed)
[facility] DEPT Provider Note   CSN: [id number] Arrival date & time: 02/14/16  2147     History   Chief Complaint Chief Complaint  Patient presents with  . Dysphagia    HPI Jacob [name] is a 82 y.o. male.  HPI Patient presents with persistent foreign body sensation in his esophagus. Was seen last night for similar symptoms after eating some chicken. Glucagon given with improvement. Patient states he's not been able to eat or drink all day today. Not tolerating his secretions. He has had increased cough but denies any fever or chills. History of atrial fibrillation on Pradaxa. Spoke with gastroenterology on call and referred to the emergency department. Past Medical History:  Diagnosis Date  . Arthritis   . Atrial fibrillation ([place])   . Benign prostatic hypertrophy   . BPH (benign prostatic hyperplasia)   . Chronic systolic heart failure (HCC)    a. EF 25-30% in past; b.Echo 11/08/11: Normal LV wall thickness, EF 45-50%. c. Echo 04/17: EF 15-20%, diffuse akinesis, moderate MR, mod dilated LA, trivial PE   . Coronary artery disease    a. Chronically occ RCA. cardiac cath 10/2010: Mid LAD 20-30%, distal LAD 40%, mid D1 40%, mid circumflex 20-30%, distal RCA occluded with left to right collaterals. c. 10/2015: cath- relatively stable dz since 2012.    Marland Kitchen Esophageal stricture   . GERD (gastroesophageal reflux disease)   . Hemorrhoids   . History of diverticulitis of colon   . History of nephrolithiasis   . HLD (hyperlipidemia)   . HTN (hypertension)   . Ischemic cardiomyopathy    a. s/p ICD 2008   . Renal insufficiency    Renal failure in July 2012 secondary to hypotension, ARB use, and   . Skin cancer   . TIA (transient ischemic attack) 10/2011   carotids neg for ICA stenosis  . Urinary retention    foley indwelling 12/2010-04/2011    Patient Active Problem List   Diagnosis Date Noted  . Food impaction of esophagus   . Acute on chronic combined systolic and diastolic  congestive heart failure ([place]) 10/14/2015  . Atrial fibrillation with RVR ([place])   . Cardiomyopathy, ischemic   . Coronary artery disease involving native heart   . Acute on chronic systolic (congestive) heart failure ([place]) 10/11/2015  . Diplopia 10/11/2015  . Depression 07/07/2015  . Esophageal obstruction due to food impaction 01/24/2014  . Syncope 02/22/2013  . Cardiomyopathy ([place]) 02/22/2013  . CAD (coronary artery disease) 02/22/2013  . GERD (gastroesophageal reflux disease) 02/22/2013  . OA (osteoarthritis) of knee 02/22/2013  . TIA (transient ischemic attack) 11/07/2011  . Urinary retention 04/13/2011  . NEVUS, ATYPICAL 08/26/2010  . ECZEMA 05/27/2010  . Atrial fibrillation ([place]) 04/29/2010  . ROTATOR CUFF INJURY, RIGHT SHOULDER 09/16/2009  . Essential hypertension 04/23/2009  . BENIGN PROSTATIC HYPERTROPHY, WITH OBSTRUCTION 04/23/2009  . ARTHRITIS 04/23/2009  . HYPERCHOLESTEROLEMIA, MIXED 04/22/2008  . CORONARY ATHEROSCLEROSIS, NATIVE VESSEL 04/22/2008  . Acute combined systolic and diastolic heart failure ([place]) 04/22/2008  . Automatic implantable cardioverter-defibrillator in situ 04/22/2008    Past Surgical History:  Procedure Laterality Date  . CARDIAC CATHETERIZATION  11-05-10  . CARDIAC CATHETERIZATION N/A 10/14/2015   Procedure: Right/Left Heart Cath and Coronary Angiography;  Surgeon: Leonie Man, MD;  Location: [facility];  Service: Cardiovascular;  Laterality: N/A;  . CARDIAC DEFIBRILLATOR PLACEMENT  12/2006   AICD  . CATARACTS REMOVED    . EP IMPLANTABLE DEVICE N/A 11/11/2015  Procedure:  ICD Generator Changeout;  Surgeon: Evans Lance, MD;  Location: [facility];  Service: Cardiovascular;  Laterality: N/A;  . ESOPHAGOGASTRODUODENOSCOPY N/A 01/24/2014   Procedure: ESOPHAGOGASTRODUODENOSCOPY (EGD);  Surgeon: Milus Banister, MD;  Location: [facility];  Service: Endoscopy;  Laterality: N/A;  . JOINT REPLACEMENT    . TOTAL KNEE ARTHROPLASTY   01/04/11   L TKA  . TRANSURETHRAL RESECTION OF PROSTATE  06/14/2011   Procedure: TRANSURETHRAL RESECTION OF THE PROSTATE (TURP);  Surgeon: Bernestine Amass, MD;  Location: [facility];  Service: Urology;  Laterality: N/A;       Home Medications    Prior to Admission medications   Medication Sig Start Date End Date Taking? Authorizing Provider  dabigatran (PRADAXA) 150 MG CAPS capsule Take 1 capsule (150 mg total) by mouth 2 (two) times daily. 07/07/15   Binnie Rail, MD  DULoxetine (CYMBALTA) 20 MG capsule TAKE 2 CAPSULES(40 MG) BY MOUTH DAILY 01/01/16   Binnie Rail, MD  finasteride (PROSCAR) 5 MG tablet Take 1 tablet (5 mg total) by mouth daily. 07/07/15   Binnie Rail, MD  fluticasone (FLONASE) 50 MCG/ACT nasal spray SHAKE LIQUID AND USE 2 SPRAYS IN EACH NOSTRIL DAILY AS NEEDED FOR ALLERGIES OR RHINITIS 08/29/15   Binnie Rail, MD  furosemide (LASIX) 40 MG tablet Take 1 tablet (40 mg total) by mouth daily. 10/17/15   Erma Heritage, PA  hydrALAZINE (APRESOLINE) 25 MG tablet Take 1.5 tablets (37.5 mg total) by mouth every 8 (eight) hours. 10/17/15   Erma Heritage, PA  HYDROcodone-acetaminophen (NORCO) 7.5-325 MG tablet Take 1 tablet by mouth every 6 (six) hours as needed for moderate pain.    Historical Provider, MD  isosorbide mononitrate (IMDUR) 30 MG 24 hr tablet Take 30 mg by mouth daily.    Historical Provider, MD  metoprolol tartrate (LOPRESSOR) 25 MG tablet Take 1 tablet (25 mg total) by mouth 2 (two) times daily. 10/17/15   Fransisco Hertz Strader, PA  NITROSTAT 0.4 MG SL tablet Place 0.4 mg under the tongue every 5 (five) minutes x 3 doses as needed for chest pain. Reported on 10/27/2015 08/13/11   Historical Provider, MD  omeprazole (PRILOSEC) 40 MG capsule Take 1 capsule (40 mg total) by mouth daily. 08/18/15   Binnie Rail, MD  ranitidine (ZANTAC) 300 MG tablet Take 1 tablet (300 mg total) by mouth at bedtime. 08/18/15   Binnie Rail, MD  rosuvastatin (CRESTOR) 10 MG tablet Take 1 tablet (10  mg total) by mouth at bedtime. 10/17/15   Erma Heritage, PA  solifenacin (VESICARE) 5 MG tablet Take 5 mg by mouth daily. Reported on 10/27/2015    Historical Provider, MD    Family History Family History  Problem Relation Age of Onset  . Heart attack Mother 65    pt unsure of age  . Liver disease Father 4  . Heart disease Son     x 2  . Colon cancer Neg Hx     Social History Social History  Substance Use Topics  . Smoking status: Former Smoker    Packs/day: 2.00    Years: 30.00    Quit date: 06/14/1968  . Smokeless tobacco: Never Used  . Alcohol use No     Allergies   Bactrim [sulfamethoxazole-trimethoprim] and Penicillins   Review of Systems Review of Systems  Constitutional: Negative for chills and fever.  Respiratory: Positive for cough and shortness of breath.   Cardiovascular: Positive  for chest pain. Negative for leg swelling.  Gastrointestinal: Positive for nausea. Negative for abdominal pain, constipation and diarrhea.  Musculoskeletal: Negative for back pain, myalgias and neck stiffness.  Skin: Negative for rash and wound.  Neurological: Negative for dizziness, weakness, light-headedness, numbness and headaches.  All other systems reviewed and are negative.    Physical Exam Updated Vital Signs BP 113/85   Pulse 111   Temp 99.1 F (37.3 C) (Oral)   Resp 18   SpO2 91%   Physical Exam  Constitutional: He is oriented to person, place, and time. He appears well-developed and well-nourished. He appears distressed.  Spitting out saliva  HENT:  Head: Normocephalic and atraumatic.  Mouth/Throat: Oropharynx is clear and moist.  No oropharyngeal foreign body visualized  Eyes: EOM are normal. Pupils are equal, round, and reactive to light.  Neck: Normal range of motion. Neck supple.  Cardiovascular: Normal rate and regular rhythm.   Pulmonary/Chest: Effort normal and breath sounds normal.  Abdominal: Soft. Bowel sounds are normal. There is no tenderness.  There is no rebound and no guarding.  Musculoskeletal: Normal range of motion. He exhibits no edema or tenderness.  Neurological: He is alert and oriented to person, place, and time.  Skin: Skin is warm and dry. No rash noted. No erythema.  Psychiatric: He has a normal mood and affect. His behavior is normal.  Nursing note and vitals reviewed.    ED Treatments / Results  Labs (all labs ordered are listed, but only abnormal results are displayed) Labs Reviewed  CBC WITH DIFFERENTIAL/PLATELET - Abnormal; Notable for the following:       Result Value   WBC 16.0 (*)    RDW 15.6 (*)    Platelets 146 (*)    Neutro Abs 14.4 (*)    All other components within normal limits  COMPREHENSIVE METABOLIC PANEL - Abnormal; Notable for the following:    Sodium 146 (*)    Glucose, Bld 153 (*)    BUN 23 (*)    Creatinine, Ser 1.67 (*)    Total Protein 8.3 (*)    ALT 13 (*)    Total Bilirubin 1.6 (*)    GFR calc non Af Amer 37 (*)    GFR calc Af Amer 42 (*)    All other components within normal limits  I-STAT TROPOININ, ED    EKG  EKG Interpretation  Date/Time:  Saturday February 14 2016 22:14:53 EDT Ventricular Rate:  125 PR Interval:    QRS Duration: 120 QT Interval:  340 QTC Calculation: 491 R Axis:   28 Text Interpretation:  Atrial flutter with predominant 2:1 AV block LVH with secondary repolarization abnormality Borderline prolonged QT interval Baseline wander in lead(s) V5 Confirmed by Lita Mains  MD, Jewelianna Pancoast ([id number]) on 02/14/2016 10:27:03 PM       Radiology Dg Chest 2 View  Result Date: 02/14/2016 CLINICAL DATA:  Initial evaluation for persistent spitting and cough. Status post recent smooth lodged in throat. EXAM: CHEST  2 VIEW COMPARISON:  Prior radiograph from 10/11/2015. FINDINGS: Left-sided Gregary Signs transvenous pacemaker/AICD is unchanged. Cardiomegaly is stable. Mediastinal silhouette within normal limits. No radiopaque foreign body identified. No significant dilatation of the  upper esophagus appreciated. Atheromatous plaque noted within the aortic arch. Lungs normally inflated. No focal infiltrates identified. No pulmonary edema or pleural effusion. No pneumothorax. No acute osseous abnormality. Degenerative changes noted about the shoulders. IMPRESSION: 1. No active cardiopulmonary disease identified. No radiopaque foreign body identified within chest. 2. Stable cardiomegaly without pulmonary  edema. 3. Aortic atherosclerosis. Electronically Signed   By: Jeannine Boga M.D.   On: 02/14/2016 05:01   Dg Chest Port 1 View  Result Date: 02/14/2016 CLINICAL DATA:  Unable to swallow anything for 2 days. Initial encounter. EXAM: PORTABLE CHEST 1 VIEW COMPARISON:  Chest radiograph performed earlier today at 4:40 a.m. FINDINGS: The lungs are well-aerated. Mild left basilar opacity may reflect pneumonia. There is no evidence of pleural effusion or pneumothorax. The cardiomediastinal silhouette is borderline enlarged. A pacemaker/AICD is noted overlying the left chest wall, with leads ending overlying the right atrium and right ventricle. No acute osseous abnormalities are seen. IMPRESSION: Mild left basilar airspace opacity may reflect pneumonia. Electronically Signed   By: Garald Balding M.D.   On: 02/14/2016 23:35    Procedures Procedures (including critical care time)  Medications Ordered in ED Medications  levofloxacin (LEVAQUIN) IVPB 750 mg (not administered)  sodium chloride 0.9 % bolus 500 mL (500 mLs Intravenous New Bag/Given 02/14/16 2303)  glucagon (human recombinant) (GLUCAGEN) injection 1 mg (1 mg Intravenous Given 02/14/16 2258)  diltiazem (CARDIZEM) injection 10 mg (10 mg Intravenous Given 02/14/16 2259)     Initial Impression / Assessment and Plan / ED Course  I have reviewed the triage vital signs and the nursing notes.  Pertinent labs & imaging results that were available during my care of the patient were reviewed by me and considered in my medical decision  making (see chart for details).  Clinical Course   Seen by GI and had endoscopy performed in the emergency department with clearance of his esophageal obstruction. Given Cardizem with improvement of RVR. New infiltrate noted on x-ray. Will start on antibiotics for likely aspiration related. Discussed with hospitalist and will admit.   Final Clinical Impressions(s) / ED Diagnoses   Final diagnoses:  Esophageal obstruction due to food impaction  Atrial fibrillation with RVR ([place])  Pulmonary infiltrates on CXR    New Prescriptions New Prescriptions   No medications on file     Julianne Rice, MD 02/15/16 1702

## 2016-02-16 LAB — BASIC METABOLIC PANEL
Anion gap: 6 (ref 5–15)
BUN: 29 mg/dL — ABNORMAL HIGH (ref 6–20)
CALCIUM: 8.4 mg/dL — AB (ref 8.9–10.3)
CHLORIDE: 111 mmol/L (ref 101–111)
CO2: 23 mmol/L (ref 22–32)
CREATININE: 1.35 mg/dL — AB (ref 0.61–1.24)
GFR calc Af Amer: 55 mL/min — ABNORMAL LOW (ref >60)
GFR calc non Af Amer: 47 mL/min — ABNORMAL LOW (ref >60)
GLUCOSE: 112 mg/dL — AB (ref 65–99)
Potassium: 4 mmol/L (ref 3.5–5.1)
Sodium: 140 mmol/L (ref 135–145)

## 2016-02-16 LAB — CBC
HEMATOCRIT: 32.5 % — AB (ref 39.0–52.0)
HEMOGLOBIN: 10.5 g/dL — AB (ref 13.0–17.0)
MCH: 28.6 pg (ref 26.0–34.0)
MCHC: 32.3 g/dL (ref 30.0–36.0)
MCV: 88.6 fL (ref 78.0–100.0)
Platelets: 140 10*3/uL — ABNORMAL LOW (ref 150–400)
RBC: 3.67 MIL/uL — ABNORMAL LOW (ref 4.22–5.81)
RDW: 15.7 % — AB (ref 11.5–15.5)
WBC: 11.3 10*3/uL — ABNORMAL HIGH (ref 4.0–10.5)

## 2016-02-16 LAB — TROPONIN I: Troponin I: <0.03 ng/mL (ref <0.03)

## 2016-02-16 MED ORDER — SUCRALFATE 1 GM/10ML PO SUSP: 1.0000 g | Freq: Three times a day (TID) | ORAL | 0 refills | Status: AC

## 2016-02-16 MED ORDER — GI COCKTAIL ~~LOC~~
30.0000 mL | Freq: Once | ORAL | Status: AC
  Administered 2016-02-16: 30 mL via ORAL
  Filled 2016-02-16: qty 30

## 2016-02-16 MED ORDER — PANTOPRAZOLE SODIUM 40 MG PO TBEC: 40.0000 mg | DELAYED_RELEASE_TABLET | Freq: Every day | ORAL | 1 refills | Status: DC

## 2016-02-16 MED ORDER — LEVOFLOXACIN 750 MG PO TABS: 750.0000 mg | ORAL_TABLET | Freq: Every day | ORAL | 0 refills | Status: DC

## 2016-02-16 NOTE — Progress Notes (Signed)
Assumed care of pt, report given by Andris Flurry, RN. Agree with shift assessment. Will continue to monitor pt closely.

## 2016-02-16 NOTE — Evaluation (Signed)
Physical Therapy Evaluation Patient Details Name: Jacob [name] MRN: [id number] DOB: 17-Feb-1932 Today's Date: 02/16/2016   History of Present Illness  82 yo male admitted with asp pna. Hx of CAD, A fib.   Clinical Impression  On eval, pt walked ~100 feet with a RW. Pt tolerated activity well. Noted pt to be somewhat unsteady with cane so practiced ambulating with RW. Improved stability with RW use. Pt reports he recently completed OP PT. At this time, recommend HHPT follow if pt/family are agreeable. No family present during session. Discussed RW use with pt as well.     Follow Up Recommendations Home health PT;Supervision for mobility/OOB    Equipment Recommendations  Rolling walker with 5" wheels    Recommendations for Other Services       Precautions / Restrictions Precautions Precautions: Fall Restrictions Weight Bearing Restrictions: No      Mobility  Bed Mobility Overal bed mobility: Needs Assistance Bed Mobility: Supine to Sit     Supine to sit: Supervision     General bed mobility comments: for safety. Increased time.   Transfers Overall transfer level: Needs assistance Equipment used: Rolling walker (2 wheeled) Transfers: Sit to/from Stand Sit to Stand: Min guard         General transfer comment: close guard for safety.   Ambulation/Gait Ambulation/Gait assistance: Min guard Ambulation Distance (Feet): 100 Feet Assistive device: Rolling walker (2 wheeled);Straight cane Gait Pattern/deviations: Step-through pattern;Decreased stride length     General Gait Details: Pt walked ~60 feet with straight cane and ~40 feet with RW. close guard for safety. Unsteady. Improved stability with RW use.  Stairs            Wheelchair Mobility    Modified Rankin (Stroke Patients Only)       Balance Overall balance assessment: Needs assistance           Standing balance-Leahy Scale: Fair                               Pertinent  Vitals/Pain Pain Assessment: No/denies pain    Home Living Family/patient expects to be discharged to:: Private residence Living Arrangements: Children Available Help at Discharge: Family Type of Home: House Home Access: Stairs to enter Entrance Stairs-Rails: Can reach both Entrance Stairs-Number of Steps: 2 Home Layout: One level Home Equipment: Cane - single point Additional Comments: Pt states he has access to walkers at home but daughter states he does not    Prior Function Level of Independence: Independent with assistive device(s)         Comments: using cane for ambulation     Hand Dominance        Extremity/Trunk Assessment   Upper Extremity Assessment: Generalized weakness           Lower Extremity Assessment: Generalized weakness      Cervical / Trunk Assessment: Normal  Communication   Communication: No difficulties  Cognition Arousal/Alertness: Awake/alert Behavior During Therapy: WFL for tasks assessed/performed Overall Cognitive Status: Within Functional Limits for tasks assessed                      General Comments      Exercises        Assessment/Plan    PT Assessment Patient needs continued PT services  PT Diagnosis Difficulty walking;Generalized weakness   PT Problem List Decreased strength;Decreased mobility;Decreased balance;Decreased activity tolerance;Decreased knowledge of use of DME  PT Treatment Interventions DME instruction;Gait training;Functional mobility training;Therapeutic activities;Therapeutic exercise;Balance training;Wheelchair mobility training;Patient/family education   PT Goals (Current goals can be found in the Care Plan section) Acute Rehab PT Goals Patient Stated Goal: none stated PT Goal Formulation: With patient Time For Goal Achievement: 03/01/16 Potential to Achieve Goals: Good    Frequency Min 3X/week   Barriers to discharge        Co-evaluation               End of Session  Equipment Utilized During Treatment: Gait belt Activity Tolerance: Patient tolerated treatment well Patient left: in chair;with call bell/phone within reach;with chair alarm set           Time: [id number] PT Time Calculation (min) (ACUTE ONLY): 17 min   Charges:   PT Evaluation $PT Eval Low Complexity: 1 Procedure     PT G Codes:        Weston Anna, MPT Pager: [phone]

## 2016-02-16 NOTE — Progress Notes (Signed)
Pt tolerated both breakfast and lunch with issues or signs of aspiration. Will cont to monitor, daughter at bedside. SRP, RN

## 2016-02-16 NOTE — Discharge Summary (Signed)
Physician Discharge Summary  Jacob [name] [id number] DOB: February 23, 1932 DOA: 02/14/2016  PCP: Binnie Rail, MD  Admit date: 02/14/2016 Discharge date: 02/16/2016  Recommendations for Outpatient Follow-up:  1. Pt will need to follow up with PCP in 2-3 weeks post discharge 2. Please obtain BMP to evaluate electrolytes and kidney function 3. Please also check CBC to evaluate Hg and Hct levels 4. Pt advised to complete therapy with Levaquin upon discharge   Discharge Diagnoses:  Principal Problem:   Aspiration pneumonitis ([place]) Active Problems:   Essential hypertension  Discharge Condition: Stable  Diet recommendation: soft  History of present illness:  82 y.o. male with a past medical history significant for CAD and ischemic CM with ICD EF 15-20%, CKD III baseline Cr 1.4-1.6, Afib on dabigatran and esophageal strictures who presented with food impaction and cough.  The patient was in his usual health until last night he was eating chicken when he felt like he was choking or that food was stuck in her throat. He presented to the ER where he was given glucagon, subsequently vomited some bits of chicken.  Hospital Course:  1. Aspiration pneumonitis:  - New left base opacity - Likely aspiration in setting of food impaction.  - pt was started on Levaquin and wants to go home today, his family is in agreement and he was advised to complete therapy upon discharge   2. Food impaction and esophagitis:  - Patient noted to have some erosive esophagitis and gastritis on endoscopy. - Protonix 40 mg PO daily. - Sucralfate 1g PO QID for 2 weeks.  3. Chronic systolic CHF:  - EF 0000000.  ICD in place.  Euvolemic. - resume home medical regimen   4. Atrial fibrillation, chronic  - CHADS2Vasc 5.  Rate controlled  - Continue home metoprolol - Continue dabigatran  5. GERD:  - Continue PPI and H2RA  6. BPH and overactive bladder:  - Continue finasteride and Vesicare  7. Depression:   - Continue duloxetine  8. Hypernatremia: - resolved   DVT prophylaxis: On dabigatran  Code Status: Partial (DO NOT CPR, intubation okay, has ICD)  Family Communication: daughter over the phone   Procedures/Studies: Dg Chest 2 View  Result Date: 02/14/2016 CLINICAL DATA:  Initial evaluation for persistent spitting and cough. Status post recent smooth lodged in throat. EXAM: CHEST  2 VIEW COMPARISON:  Prior radiograph from 10/11/2015. FINDINGS: Left-sided Gregary Signs transvenous pacemaker/AICD is unchanged. Cardiomegaly is stable. Mediastinal silhouette within normal limits. No radiopaque foreign body identified. No significant dilatation of the upper esophagus appreciated. Atheromatous plaque noted within the aortic arch. Lungs normally inflated. No focal infiltrates identified. No pulmonary edema or pleural effusion. No pneumothorax. No acute osseous abnormality. Degenerative changes noted about the shoulders. IMPRESSION: 1. No active cardiopulmonary disease identified. No radiopaque foreign body identified within chest. 2. Stable cardiomegaly without pulmonary edema. 3. Aortic atherosclerosis. Electronically Signed   By: Jeannine Boga M.D.   On: 02/14/2016 05:01   Dg Chest Port 1 View  Result Date: 02/14/2016 CLINICAL DATA:  Unable to swallow anything for 2 days. Initial encounter. EXAM: PORTABLE CHEST 1 VIEW COMPARISON:  Chest radiograph performed earlier today at 4:40 a.m. FINDINGS: The lungs are well-aerated. Mild left basilar opacity may reflect pneumonia. There is no evidence of pleural effusion or pneumothorax. The cardiomediastinal silhouette is borderline enlarged. A pacemaker/AICD is noted overlying the left chest wall, with leads ending overlying the right atrium and right ventricle. No acute osseous abnormalities are seen. IMPRESSION: Mild  left basilar airspace opacity may reflect pneumonia. Electronically Signed   By: Garald Balding M.D.   On: 02/14/2016 23:35    Discharge  Exam: Vitals:   02/15/16 2034 02/16/16 0411  BP: (!) 126/95 135/66  Pulse: 61 66  Resp: 20 18  Temp: 98.9 F (37.2 C) 98.2 F (36.8 C)   Vitals:   02/15/16 0505 02/15/16 1500 02/15/16 2034 02/16/16 0411  BP: 118/71 121/70 (!) 126/95 135/66  Pulse: 88 79 61 66  Resp: (!) 24 18 20 18   Temp: 97.9 F (36.6 C) 98.1 F (36.7 C) 98.9 F (37.2 C) 98.2 F (36.8 C)  TempSrc: Oral Oral Oral Oral  SpO2: 97% 98% 100% 98%  Weight:      Height:        General: Pt is alert, follows commands appropriately, not in acute distress Cardiovascular: Regular rate and rhythm, S1/S2 +, no rubs, no gallops Respiratory: Clear to auscultation bilaterally, no wheezing, mild rhonchi at bases  Abdominal: Soft, non tender, non distended, bowel sounds +, no guarding  Discharge Instructions  Discharge Instructions    Diet - low sodium heart healthy    Complete by:  As directed   Increase activity slowly    Complete by:  As directed       Medication List    STOP taking these medications   omeprazole 40 MG capsule Commonly known as:  PRILOSEC Replaced by:  pantoprazole 40 MG tablet     TAKE these medications   dabigatran 150 MG Caps capsule Commonly known as:  PRADAXA Take 1 capsule (150 mg total) by mouth 2 (two) times daily.   DULoxetine 20 MG capsule Commonly known as:  CYMBALTA TAKE 2 CAPSULES(40 MG) BY MOUTH DAILY   finasteride 5 MG tablet Commonly known as:  PROSCAR Take 1 tablet (5 mg total) by mouth daily.   fluticasone 50 MCG/ACT nasal spray Commonly known as:  FLONASE SHAKE LIQUID AND USE 2 SPRAYS IN EACH NOSTRIL DAILY AS NEEDED FOR ALLERGIES OR RHINITIS   furosemide 40 MG tablet Commonly known as:  LASIX Take 1 tablet (40 mg total) by mouth daily.   hydrALAZINE 25 MG tablet Commonly known as:  APRESOLINE Take 1.5 tablets (37.5 mg total) by mouth every 8 (eight) hours.   HYDROcodone-acetaminophen 7.5-325 MG tablet Commonly known as:  NORCO Take 1 tablet by mouth every 6  (six) hours as needed for moderate pain.   isosorbide mononitrate 30 MG 24 hr tablet Commonly known as:  IMDUR Take 30 mg by mouth daily.   levofloxacin 750 MG tablet Commonly known as:  LEVAQUIN Take 1 tablet (750 mg total) by mouth daily.   metoprolol tartrate 25 MG tablet Commonly known as:  LOPRESSOR Take 1 tablet (25 mg total) by mouth 2 (two) times daily.   NITROSTAT 0.4 MG SL tablet Generic drug:  nitroGLYCERIN Place 0.4 mg under the tongue every 5 (five) minutes x 3 doses as needed for chest pain. Reported on 10/27/2015   pantoprazole 40 MG tablet Commonly known as:  PROTONIX Take 1 tablet (40 mg total) by mouth daily. Replaces:  omeprazole 40 MG capsule   ranitidine 300 MG tablet Commonly known as:  ZANTAC Take 1 tablet (300 mg total) by mouth at bedtime.   rosuvastatin 10 MG tablet Commonly known as:  CRESTOR Take 1 tablet (10 mg total) by mouth at bedtime.   solifenacin 5 MG tablet Commonly known as:  VESICARE Take 5 mg by mouth daily. Reported on 10/27/2015  sucralfate 1 GM/10ML suspension Commonly known as:  CARAFATE Take 10 mLs (1 g total) by mouth 4 (four) times daily -  with meals and at bedtime.       Follow-up Information    Binnie Rail, MD .   Specialty:  Internal Medicine Contact information: [place] [address] [phone]        Faye Ramsay, MD .   Specialty:  Internal Medicine Why:  call my cell phone [phone] Contact information: [address] [place] [address] [phone]            The results of significant diagnostics from this hospitalization (including imaging, microbiology, ancillary and laboratory) are listed below for reference.     Microbiology: No results found for this or any previous visit (from the past 240 hour(s)).   Labs: Basic Metabolic Panel:  Recent Labs Lab 02/14/16 2312 02/15/16 0550 02/16/16 0436  NA 146* 144 140  K 4.2 4.0 4.0  CL 111  111 111  CO2 26 27 23   GLUCOSE 153* 94 112*  BUN 23* 26* 29*  CREATININE 1.67* 1.57* 1.35*  CALCIUM 9.2 8.6* 8.4*   Liver Function Tests:  Recent Labs Lab 02/14/16 2312 02/15/16 0550  AST 22 19  ALT 13* 12*  ALKPHOS 69 55  BILITOT 1.6* 1.4*  PROT 8.3* 6.8  ALBUMIN 4.2 3.4*   No results for input(s): LIPASE, AMYLASE in the last 168 hours. No results for input(s): AMMONIA in the last 168 hours. CBC:  Recent Labs Lab 02/14/16 2312 02/15/16 0550 02/16/16 0436  WBC 16.0* 14.2* 11.3*  NEUTROABS 14.4*  --   --   HGB 14.1 10.9* 10.5*  HCT 41.7 33.6* 32.5*  MCV 88.2 90.6 88.6  PLT 146* 144* 140*   Cardiac Enzymes:  Recent Labs Lab 02/16/16 0436  TROPONINI <0.03   BNP: BNP (last 3 results)  Recent Labs  10/11/15 0950 10/16/15 0413 10/17/15 0509  BNP 2,667.6* 791.0* 685.2*   SIGNED: Time coordinating discharge: 30 minutes  Faye Ramsay, MD  [facility] 02/16/2016, 1:03 PM Pager [phone]  If 7PM-7AM, please contact night-coverage [email or web address] Password [email or web address]

## 2016-02-16 NOTE — Progress Notes (Signed)
Pt d/c home with son, awaits equipment. No acute issue, acknowledge understanding. SRP,RN

## 2016-02-16 NOTE — Discharge Instructions (Signed)
Aspiration Precautions °Aspiration is the breathing in (inhalation) of a liquid or object into the lungs. Things that can be inhaled into the lungs include:  °· Food. °· Any type of liquid, such as drinks or saliva. °· Stomach contents, such as vomit or stomach acid. °When these things go into the lungs, damage can occur and serious complications can result, such as: °· Lung infection (pneumonia). °· Collection of infected liquid (pus) in the lungs (lung abscess). °· Death. °CAUSES °The cause of aspiration may include:  °· A lowered level of awareness (consciousness) due to: °¨ Traumatic brain injury or head injury. °¨ Stroke. °¨ Diseases of the nerves, brain, or spinal cord. °¨ Seizures. °· A problem with the gag reflex. The gag reflex protects the body from swallowing things too quickly or things that are too large. °· Medical conditions that affect swallowing. °· Conditions that affect the food pipe (esophagus). °· Acid reflux. This is when stomach acid moves into the esophagus. °· Any type of surgery where a medicine to sleep (general anesthetic) or relax (sedative) is given. °· Alcohol abuse. °· Illegal drug abuse. °· Taking medicine that causes sleepiness, confusion, or weakness. °· Aging. °· Dental problems. °· Having a feeding tube. °SIGNS AND SYMPTOMS °Symptoms of aspiration may include:  °· Coughing after swallowing food or liquids. °· Difficulty breathing. This may include: °¨ Breathing quickly. °¨ Breathing very slowly. °¨ Loud breathing. °¨ Rumbling sounds from the lungs while breathing. °· Coughing up phlegm (sputum) that: °¨ Is yellow, tan, or green. °¨ Has pieces of food in it. °¨ Is bad smelling. °· A change in voice so that it sounds scratchy. °· A change in skin color. The skin may look red or blue.    °· Fever. °· Watery eyes. °· Pain in the chest or back. °· A pained look on the face.    °· A feeling of fullness in the throat or that something is stuck in the throat. °DIAGNOSIS °Aspiration may  be diagnosed by:  °· Chest X-ray. °· Bronchoscopy. This is a surgical procedure in which a thin, flexible tube with a camera is inserted into the nose or mouth to the lungs. The health care provider can then view the lungs. °· A swallowing evaluation study to find out: °¨ A person's risk of aspiration. °¨ How difficult it is for a person to swallow. °¨ What types of foods are safe for a person to eat. °PREVENTION °If you are caring for someone who can eat and drink through his or her mouth:  °· Have the person sit in an upright position when eating food or drinking fluids, such as: °¨ Sitting up in a chair. °¨ If sitting in a chair is not possible, position the person in bed so he or she is upright. °· Remind the person to eat slowly and chew well. °· Do not distract the person. This is especially important for people with thinking or memory (cognitive) problems. °· Check the person's mouth for leftover food after eating. °· Keep the person sitting upright for 30-45 minutes after eating. °· Do not serve food or drink for at least 2 hours before bedtime. °If you are caring for someone with a feeding tube who cannot eat or drink through his or her mouth: °· Keep the person in an upright position as much as possible. °· Do not  lay the person flat if he or she is getting continuous feedings. Turn the feeding pump off if you need to lay the person flat   for any reason. °· Check feeding tube residuals as directed by your health care provider. Ask your health care provider what residual amount is too high. °General guidelines to prevent aspiration in someone you are caring for include: °· Feed small amounts of food. Do not force feed. °· Food should be thickened as directed by the person's speech pathologist. °· Use as little water as possible when brushing the person's teeth or cleaning his or her mouth. °· Provide oral care before and after meals. °· Never put food or liquids in the mouth of a person who is not fully  alert. °· Crush pills and put them in soft food such as pudding or ice cream. Some pills should not be crushed. Check with your health care provider before crushing any medicine. °SEEK MEDICAL CARE IF: °· The person has a feeding tube and the feeding tube residual amount is too high. °· The person has a fever. °· The person tries to avoid food, such as refusing to eat or be fed, or is eating less than normal. °SEEK IMMEDIATE MEDICAL CARE IF:  °· The person has trouble breathing or starts to breathe quickly. °· The person is breathing very slowly or stops breathing. °· The person coughs a lot after eating or drinking. °· The person has a long-lasting (chronic) cough. °· The person coughs up thick, yellow, or tan sputum. °MAKE SURE YOU:  °· Understand these instructions. °· Will watch the person's condition. °· Will get help right away if the person is not doing well or gets worse. °  °This information is not intended to replace advice given to you by your health care provider. Make sure you discuss any questions you have with your health care provider. °  °Document Released: 07/03/2010 Document Revised: 06/21/2014 Document Reviewed: 09/05/2013 °Elsevier Interactive Patient Education ©2016 [employer] ° °

## 2016-02-16 NOTE — Care Management Note (Signed)
Case Management Note  Patient Details  Name: REFAEL BERGARA MRN: [id number] Date of Birth: 01/11/1932  Subjective/Objective:  Pt recc HHPT, rw. MD notified for HHPT,f1f,home rw order. Provided patient w/HHC agency list for [facility] chosen for [facility]. [facility] rep Santiago Glad aware of d/c & HHC,orders.[facility] dme rep Jermaine aware of home rw-will deliver rw to rm prior d/c. Nsg aware.                  Action/Plan:d/c home w/HHC/dme.   Expected Discharge Date:                  Expected Discharge Plan:  [place]  In-House Referral:     Discharge planning Services  CM Consult  Post Acute Care Choice:    Choice offered to:  Patient  DME Arranged:  [place] rolling DME Agency:     HH Arranged:  PT [place]:  [place]  Status of Service:  Completed, signed off  If discussed at [place] of Stay Meetings, dates discussed:    Additional Comments:  Dessa Phi, RN 02/16/2016, 3:44 PM

## 2016-02-17 ENCOUNTER — Encounter: Payer: Self-pay | Admitting: Internal Medicine

## 2016-02-17 ENCOUNTER — Encounter (HOSPITAL_COMMUNITY): Payer: Self-pay | Admitting: Gastroenterology

## 2016-02-23 ENCOUNTER — Encounter: Payer: Self-pay | Admitting: Internal Medicine

## 2016-02-23 ENCOUNTER — Ambulatory Visit (INDEPENDENT_AMBULATORY_CARE_PROVIDER_SITE_OTHER): Payer: Medicare Other | Admitting: Internal Medicine

## 2016-02-23 ENCOUNTER — Other Ambulatory Visit (INDEPENDENT_AMBULATORY_CARE_PROVIDER_SITE_OTHER): Payer: Medicare Other

## 2016-02-23 ENCOUNTER — Other Ambulatory Visit: Payer: Self-pay | Admitting: Internal Medicine

## 2016-02-23 VITALS — BP 126/80 | HR 112 | Temp 97.6°F | Resp 18 | Wt 156.0 lb

## 2016-02-23 DIAGNOSIS — K219 Gastro-esophageal reflux disease without esophagitis: Secondary | ICD-10-CM | POA: Diagnosis not present

## 2016-02-23 DIAGNOSIS — I10 Essential (primary) hypertension: Secondary | ICD-10-CM

## 2016-02-23 DIAGNOSIS — N183 Chronic kidney disease, stage 3 unspecified: Secondary | ICD-10-CM

## 2016-02-23 DIAGNOSIS — K222 Esophageal obstruction: Secondary | ICD-10-CM | POA: Diagnosis not present

## 2016-02-23 DIAGNOSIS — I4891 Unspecified atrial fibrillation: Secondary | ICD-10-CM

## 2016-02-23 DIAGNOSIS — J690 Pneumonitis due to inhalation of food and vomit: Secondary | ICD-10-CM | POA: Diagnosis not present

## 2016-02-23 DIAGNOSIS — I255 Ischemic cardiomyopathy: Secondary | ICD-10-CM | POA: Diagnosis not present

## 2016-02-23 DIAGNOSIS — D649 Anemia, unspecified: Secondary | ICD-10-CM

## 2016-02-23 DIAGNOSIS — T18128A Food in esophagus causing other injury, initial encounter: Secondary | ICD-10-CM

## 2016-02-23 LAB — CBC WITH DIFFERENTIAL/PLATELET
Basophils Absolute: 0 10*3/uL (ref 0.0–0.1)
Basophils Relative: 0.4 % (ref 0.0–3.0)
EOS PCT: 1.8 % (ref 0.0–5.0)
Eosinophils Absolute: 0.2 10*3/uL (ref 0.0–0.7)
HCT: 36.2 % — ABNORMAL LOW (ref 39.0–52.0)
Hemoglobin: 12.1 g/dL — ABNORMAL LOW (ref 13.0–17.0)
LYMPHS ABS: 1.5 10*3/uL (ref 0.7–4.0)
Lymphocytes Relative: 15.7 % (ref 12.0–46.0)
MCHC: 33.3 g/dL (ref 30.0–36.0)
MCV: 87.2 fl (ref 78.0–100.0)
MONO ABS: 0.7 10*3/uL (ref 0.1–1.0)
Monocytes Relative: 7.3 % (ref 3.0–12.0)
NEUTROS ABS: 6.9 10*3/uL (ref 1.4–7.7)
NEUTROS PCT: 74.8 % (ref 43.0–77.0)
PLATELETS: 266 10*3/uL (ref 150.0–400.0)
RBC: 4.16 Mil/uL — AB (ref 4.22–5.81)
RDW: 15.8 % — AB (ref 11.5–15.5)
WBC: 9.3 10*3/uL (ref 4.0–10.5)

## 2016-02-23 LAB — COMPREHENSIVE METABOLIC PANEL
ALK PHOS: 61 U/L (ref 39–117)
ALT: 7 U/L (ref 0–53)
AST: 11 U/L (ref 0–37)
Albumin: 3.6 g/dL (ref 3.5–5.2)
BUN: 15 mg/dL (ref 6–23)
CO2: 28 meq/L (ref 19–32)
Calcium: 8.8 mg/dL (ref 8.4–10.5)
Chloride: 105 mEq/L (ref 96–112)
Creatinine, Ser: 1.36 mg/dL (ref 0.40–1.50)
GFR: 53.26 mL/min — ABNORMAL LOW (ref >60.00)
GLUCOSE: 111 mg/dL — AB (ref 70–99)
POTASSIUM: 3.6 meq/L (ref 3.5–5.1)
SODIUM: 141 meq/L (ref 135–145)
TOTAL PROTEIN: 7.2 g/dL (ref 6.0–8.3)
Total Bilirubin: 0.9 mg/dL (ref 0.2–1.2)

## 2016-02-23 MED ORDER — NITROGLYCERIN 0.4 MG SL SUBL: 0.4000 mg | SUBLINGUAL_TABLET | SUBLINGUAL | 1 refills | Status: DC | PRN

## 2016-02-23 NOTE — Assessment & Plan Note (Signed)
BP well controlled Current regimen effective and well tolerated Continue current medications at current doses  

## 2016-02-23 NOTE — Progress Notes (Signed)
Pre visit review using our clinic review tool, if applicable. No additional management support is needed unless otherwise documented below in the visit note. 

## 2016-02-23 NOTE — Assessment & Plan Note (Signed)
First chest x-ray was normal and second chest x-ray shows pneumonia Clinically improved No need to repeat chest x-ray given the first x-ray He will monitor his symptoms. His symptoms worsen he will return for further follow-up

## 2016-02-23 NOTE — Assessment & Plan Note (Signed)
No GERD symptoms Esophagitis and gastritis seen on EGD with mild bleeding Continue pantoprazole Complete Carafate-full 2 weeks

## 2016-02-23 NOTE — Assessment & Plan Note (Signed)
EGD 9/3 - food removed If difficulty swallowing dose not improve will need to follow up with GI

## 2016-02-23 NOTE — Assessment & Plan Note (Addendum)
In Afib today, mild RVR Not on pradaxa  Restart Pradaxa - ok per GI

## 2016-02-23 NOTE — Assessment & Plan Note (Signed)
cmp

## 2016-02-23 NOTE — Patient Instructions (Addendum)
Restart the pradaxa.  Talk to your pharmacist about changing medications to liquid or which medications have be crushed.     Test(s) ordered today. Your results will be released to [place] (or called to you) after review, usually within 72hours after test completion. If any changes need to be made, you will be notified at that same time.  All other Health Maintenance issues reviewed.   All recommended immunizations and age-appropriate screenings are up-to-date or discussed.  No immunizations administered today.   Medications reviewed and updated.  No changes recommended at this time.  Your prescription(s) have been submitted to your pharmacy. Please take as directed and contact our office if you believe you are having problem(s) with the medication(s).   Please followup in 6 months

## 2016-02-23 NOTE — Progress Notes (Signed)
Subjective:    Patient ID: Jacob [name], male    DOB: 01-Jun-1932, 82 y.o.   MRN: [id number]  HPI He is here for follow up from the hospital.  He was admitted 9/2 and discharged 9/4 for aspiration pneumonitis.  He went to the ED 9/1 due to food impaction in his esophagus.  He vomited in the ED and it was thought that he vomited up all the chicken.  He was able to tolerate oral intake. He was discharged.  He returned on 9/2 for food still being stuck in his esophagus, cough and weakness.  He was diagnosed with pneumonia.    Pneumonia:  He completed the antibiotic.  He coughs at night.  He coughs up clear phlegm.  He has occasional wheeze.  He denies dyspnea on exertion, but is not very active. He denies fever/ chills.  His appetite is decreased.  He still feels weak.  He has felt better since the hospital, but still weak and tired.    He was had an EGD 9/3 and the chicken was removed. He did have some esophagitis and gastritis with bleeding. Was recommended that he take the Protonix daily, Carafate 3 times daily. He was advised that he could resume the Pradaxa, but he has not.  He is having difficulty swallowing.    He is taking protonix daily and carafate daily.  He denies gerd and abdominal pain.      Medications and allergies reviewed with patient and updated if appropriate.  Patient Active Problem List   Diagnosis Date Noted  . Aspiration pneumonitis ([place]) 02/15/2016  . Hypernatremia 02/15/2016  . CKD (chronic kidney disease), stage III 02/15/2016  . Food impaction of esophagus   . Acute on chronic combined systolic and diastolic congestive heart failure ([place]) 10/14/2015  . Atrial fibrillation with RVR ([place])   . Cardiomyopathy, ischemic   . Coronary artery disease involving native heart   . Acute on chronic systolic (congestive) heart failure ([place]) 10/11/2015  . Diplopia 10/11/2015  . Depression 07/07/2015  . Esophageal obstruction due to food impaction 01/24/2014  . Syncope  02/22/2013  . Cardiomyopathy ([place]) 02/22/2013  . CAD (coronary artery disease) 02/22/2013  . GERD (gastroesophageal reflux disease) 02/22/2013  . OA (osteoarthritis) of knee 02/22/2013  . TIA (transient ischemic attack) 11/07/2011  . Urinary retention 04/13/2011  . NEVUS, ATYPICAL 08/26/2010  . ECZEMA 05/27/2010  . Atrial fibrillation ([place]) 04/29/2010  . ROTATOR CUFF INJURY, RIGHT SHOULDER 09/16/2009  . Essential hypertension 04/23/2009  . BENIGN PROSTATIC HYPERTROPHY, WITH OBSTRUCTION 04/23/2009  . ARTHRITIS 04/23/2009  . HYPERCHOLESTEROLEMIA, MIXED 04/22/2008  . CORONARY ATHEROSCLEROSIS, NATIVE VESSEL 04/22/2008  . Acute combined systolic and diastolic heart failure ([place]) 04/22/2008  . Automatic implantable cardioverter-defibrillator in situ 04/22/2008    Current Outpatient Prescriptions on File Prior to Visit  Medication Sig Dispense Refill  . dabigatran (PRADAXA) 150 MG CAPS capsule Take 1 capsule (150 mg total) by mouth 2 (two) times daily. 180 capsule 1  . DULoxetine (CYMBALTA) 20 MG capsule TAKE 2 CAPSULES(40 MG) BY MOUTH DAILY 180 capsule 1  . finasteride (PROSCAR) 5 MG tablet Take 1 tablet (5 mg total) by mouth daily. 90 tablet 1  . fluticasone (FLONASE) 50 MCG/ACT nasal spray SHAKE LIQUID AND USE 2 SPRAYS IN EACH NOSTRIL DAILY AS NEEDED FOR ALLERGIES OR RHINITIS 16 g 1  . furosemide (LASIX) 40 MG tablet Take 1 tablet (40 mg total) by mouth daily. 30 tablet 6  . hydrALAZINE (APRESOLINE) 25 MG  tablet Take 1.5 tablets (37.5 mg total) by mouth every 8 (eight) hours. 120 tablet 6  . HYDROcodone-acetaminophen (NORCO) 7.5-325 MG tablet Take 1 tablet by mouth every 6 (six) hours as needed for moderate pain.    . isosorbide mononitrate (IMDUR) 30 MG 24 hr tablet Take 30 mg by mouth daily.    . metoprolol tartrate (LOPRESSOR) 25 MG tablet Take 1 tablet (25 mg total) by mouth 2 (two) times daily. 60 tablet 6  . NITROSTAT 0.4 MG SL tablet Place 0.4 mg under the tongue every 5 (five)  minutes x 3 doses as needed for chest pain. Reported on 10/27/2015    . pantoprazole (PROTONIX) 40 MG tablet Take 1 tablet (40 mg total) by mouth daily. 30 tablet 1  . ranitidine (ZANTAC) 300 MG tablet Take 1 tablet (300 mg total) by mouth at bedtime. 90 tablet 1  . rosuvastatin (CRESTOR) 10 MG tablet Take 1 tablet (10 mg total) by mouth at bedtime. 30 tablet 6  . solifenacin (VESICARE) 5 MG tablet Take 5 mg by mouth daily. Reported on 10/27/2015    . sucralfate (CARAFATE) 1 GM/10ML suspension Take 10 mLs (1 g total) by mouth 4 (four) times daily -  with meals and at bedtime. 420 mL 0   No current facility-administered medications on file prior to visit.     Past Medical History:  Diagnosis Date  . Arthritis   . Atrial fibrillation ([place])   . Benign prostatic hypertrophy   . BPH (benign prostatic hyperplasia)   . Chronic systolic heart failure (HCC)    a. EF 25-30% in past; b.Echo 11/08/11: Normal LV wall thickness, EF 45-50%. c. Echo 04/17: EF 15-20%, diffuse akinesis, moderate MR, mod dilated LA, trivial PE   . Coronary artery disease    a. Chronically occ RCA. cardiac cath 10/2010: Mid LAD 20-30%, distal LAD 40%, mid D1 40%, mid circumflex 20-30%, distal RCA occluded with left to right collaterals. c. 10/2015: cath- relatively stable dz since 2012.    Marland Kitchen Esophageal stricture   . GERD (gastroesophageal reflux disease)   . Hemorrhoids   . History of diverticulitis of colon   . History of nephrolithiasis   . HLD (hyperlipidemia)   . HTN (hypertension)   . Ischemic cardiomyopathy    a. s/p ICD 2008   . Renal insufficiency    Renal failure in July 2012 secondary to hypotension, ARB use, and   . Skin cancer   . TIA (transient ischemic attack) 10/2011   carotids neg for ICA stenosis  . Urinary retention    foley indwelling 12/2010-04/2011    Past Surgical History:  Procedure Laterality Date  . CARDIAC CATHETERIZATION  11-05-10  . CARDIAC CATHETERIZATION N/A 10/14/2015   Procedure:  Right/Left Heart Cath and Coronary Angiography;  Surgeon: Leonie Man, MD;  Location: [facility];  Service: Cardiovascular;  Laterality: N/A;  . CARDIAC DEFIBRILLATOR PLACEMENT  12/2006   AICD  . CATARACTS REMOVED    . EP IMPLANTABLE DEVICE N/A 11/11/2015   Procedure:  ICD Generator Changeout;  Surgeon: Evans Lance, MD;  Location: [facility];  Service: Cardiovascular;  Laterality: N/A;  . ESOPHAGOGASTRODUODENOSCOPY N/A 01/24/2014   Procedure: ESOPHAGOGASTRODUODENOSCOPY (EGD);  Surgeon: Milus Banister, MD;  Location: [facility];  Service: Endoscopy;  Laterality: N/A;  . ESOPHAGOGASTRODUODENOSCOPY N/A 02/14/2016   Procedure: ESOPHAGOGASTRODUODENOSCOPY (EGD);  Surgeon: Mauri Pole, MD;  Location: [facility];  Service: Endoscopy;  Laterality: N/A;  . JOINT REPLACEMENT    .  TOTAL KNEE ARTHROPLASTY  01/04/11   L TKA  . TRANSURETHRAL RESECTION OF PROSTATE  06/14/2011   Procedure: TRANSURETHRAL RESECTION OF THE PROSTATE (TURP);  Surgeon: Bernestine Amass, MD;  Location: [facility];  Service: Urology;  Laterality: N/A;    Social History   Social History  . Marital status: Married    Spouse name: N/A  . Number of children: N/A  . Years of education: N/A   Occupational History  . retired    Social History Main Topics  . Smoking status: Former Smoker    Packs/day: 2.00    Years: 30.00    Quit date: 06/14/1968  . Smokeless tobacco: Never Used  . Alcohol use No  . Drug use: No  . Sexual activity: Not on file   Other Topics Concern  . Not on file   Social History Narrative   Daily caffeine     Family History  Problem Relation Age of Onset  . Heart attack Mother 57    pt unsure of age  . Liver disease Father 47  . Heart disease Son     x 2  . Colon cancer Neg Hx     Review of Systems     Objective:   Vitals:   02/23/16 1258  BP: 126/80  Pulse: (!) 112  Resp: 18  Temp: 97.6 F (36.4 C)   Filed Weights   02/23/16 1258  Weight: 156 lb (70.8 kg)     Body mass index is 23.72 kg/m.   Physical Exam Constitutional: Appears well-developed and well-nourished. No distress.  HENT:  Head: Normocephalic and atraumatic.  Neck: hoarse voice. Neck supple. No tracheal deviation present. No thyromegaly present.  Cardiovascular: Normal rate, regular rhythm and normal heart sounds.   No murmur heard. No carotid bruit  Pulmonary/Chest: Effort normal and breath sounds normal. No respiratory distress. No has no wheezes. No rales.  Abdomen:  Soft, non-tender, non-distended Musculoskeletal: No edema.  Lymphadenopathy: No cervical adenopathy.  Skin: Skin is warm and dry. Not diaphoretic.  Psychiatric: Normal mood and affect. Behavior is normal.         Assessment & Plan:   See Problem List for Assessment and Plan of chronic medical problems.  F/u in 6 months

## 2016-02-23 NOTE — Assessment & Plan Note (Signed)
cbc

## 2016-03-04 ENCOUNTER — Ambulatory Visit: Payer: Medicare Other

## 2016-03-31 ENCOUNTER — Other Ambulatory Visit: Payer: Self-pay | Admitting: Internal Medicine

## 2016-04-23 ENCOUNTER — Other Ambulatory Visit: Payer: Self-pay | Admitting: Student

## 2016-05-05 ENCOUNTER — Ambulatory Visit (INDEPENDENT_AMBULATORY_CARE_PROVIDER_SITE_OTHER): Payer: Medicare Other | Admitting: *Deleted

## 2016-05-05 DIAGNOSIS — I255 Ischemic cardiomyopathy: Secondary | ICD-10-CM | POA: Diagnosis not present

## 2016-05-05 NOTE — Progress Notes (Signed)
Remote ICD transmission.   

## 2016-05-13 ENCOUNTER — Encounter: Payer: Self-pay | Admitting: Cardiology

## 2016-05-18 DIAGNOSIS — M5136 Other intervertebral disc degeneration, lumbar region: Secondary | ICD-10-CM | POA: Diagnosis not present

## 2016-05-18 DIAGNOSIS — M1711 Unilateral primary osteoarthritis, right knee: Secondary | ICD-10-CM | POA: Diagnosis not present

## 2016-05-18 DIAGNOSIS — Z79891 Long term (current) use of opiate analgesic: Secondary | ICD-10-CM | POA: Diagnosis not present

## 2016-05-22 LAB — CUP PACEART REMOTE DEVICE CHECK
Battery Remaining Percentage: 100 %
Brady Statistic RA Percent Paced: 0 %
Date Time Interrogation Session: 20171122075000
HighPow Impedance: 40 Ohm
Implantable Lead Implant Date: 20080716
Implantable Lead Location: 753859
Implantable Lead Location: 753860
Implantable Lead Serial Number: 174941
Implantable Pulse Generator Implant Date: 20170530
Lead Channel Setting Pacing Pulse Width: 0.4 ms
Lead Channel Setting Sensing Sensitivity: 0.6 mV
MDC IDC LEAD IMPLANT DT: 20080716
MDC IDC LEAD MODEL: 158
MDC IDC LEAD SERIAL: 281722
MDC IDC MSMT BATTERY REMAINING LONGEVITY: 126 mo
MDC IDC MSMT LEADCHNL RA IMPEDANCE VALUE: 487 Ohm
MDC IDC MSMT LEADCHNL RV IMPEDANCE VALUE: 656 Ohm
MDC IDC PG SERIAL: 206833
MDC IDC SET LEADCHNL RV PACING AMPLITUDE: 2.5 V
MDC IDC STAT BRADY RV PERCENT PACED: 0 %

## 2016-06-10 ENCOUNTER — Other Ambulatory Visit: Payer: Self-pay | Admitting: Internal Medicine

## 2016-06-23 DIAGNOSIS — M1711 Unilateral primary osteoarthritis, right knee: Secondary | ICD-10-CM | POA: Diagnosis not present

## 2016-06-23 DIAGNOSIS — Z96652 Presence of left artificial knee joint: Secondary | ICD-10-CM | POA: Diagnosis not present

## 2016-08-04 ENCOUNTER — Ambulatory Visit (INDEPENDENT_AMBULATORY_CARE_PROVIDER_SITE_OTHER): Payer: Medicare Other | Admitting: *Deleted

## 2016-08-04 DIAGNOSIS — I255 Ischemic cardiomyopathy: Secondary | ICD-10-CM | POA: Diagnosis not present

## 2016-08-04 NOTE — Progress Notes (Signed)
Remote ICD transmission.   

## 2016-08-05 ENCOUNTER — Encounter: Payer: Self-pay | Admitting: Cardiology

## 2016-08-05 LAB — CUP PACEART REMOTE DEVICE CHECK
Battery Remaining Longevity: 126 mo
Brady Statistic RV Percent Paced: 0 %
HighPow Impedance: 38 Ohm
Implantable Lead Implant Date: 20080716
Implantable Lead Location: 753860
Implantable Lead Model: 4087
Implantable Lead Serial Number: 174941
Implantable Lead Serial Number: 281722
Implantable Pulse Generator Implant Date: 20170530
Lead Channel Impedance Value: 471 Ohm
Lead Channel Impedance Value: 668 Ohm
Lead Channel Pacing Threshold Amplitude: 0.8 V
Lead Channel Pacing Threshold Pulse Width: 0.4 ms
Lead Channel Setting Pacing Amplitude: 2.5 V
MDC IDC LEAD IMPLANT DT: 20080716
MDC IDC LEAD LOCATION: 753859
MDC IDC MSMT BATTERY REMAINING PERCENTAGE: 100 %
MDC IDC SESS DTM: 20180221075100
MDC IDC SET LEADCHNL RV PACING PULSEWIDTH: 0.4 ms
MDC IDC SET LEADCHNL RV SENSING SENSITIVITY: 0.6 mV
MDC IDC STAT BRADY RA PERCENT PACED: 0 %
Pulse Gen Serial Number: 206833

## 2016-08-05 NOTE — Progress Notes (Signed)
Letter  

## 2016-08-16 ENCOUNTER — Other Ambulatory Visit: Payer: Self-pay | Admitting: Student

## 2016-08-16 ENCOUNTER — Other Ambulatory Visit: Payer: Self-pay | Admitting: Internal Medicine

## 2016-08-17 ENCOUNTER — Other Ambulatory Visit: Payer: Self-pay | Admitting: Internal Medicine

## 2016-08-18 ENCOUNTER — Other Ambulatory Visit: Payer: Self-pay | Admitting: Internal Medicine

## 2016-08-23 ENCOUNTER — Ambulatory Visit: Payer: Medicare Other | Admitting: Internal Medicine

## 2016-08-24 ENCOUNTER — Encounter: Payer: Medicare Other | Admitting: Internal Medicine

## 2016-08-24 NOTE — Progress Notes (Signed)
Subjective:   Jacob [name] is a 83 y.o. male who presents for an Initial Medicare Annual Wellness Visit.  Review of Systems  No ROS.  Medicare Wellness Visit.    Sleep patterns: Home Safety/Smoke Alarms:   Living environment; residence and Firearm Safety: Seat Belt Safety/Bike Helmet: Wears seat belt.   Counseling:   Eye Exam-  Dental-  Male:   CCS-  N/A aged out.   PSA- No results found for: PSA     Objective:    There were no vitals filed for this visit. There is no height or weight on file to calculate BMI.  Current Medications (verified) Outpatient Encounter Prescriptions as of 08/24/2016  Medication Sig  . dabigatran (PRADAXA) 150 MG CAPS capsule Take 1 capsule (150 mg total) by mouth 2 (two) times daily.  . DULoxetine (CYMBALTA) 20 MG capsule TAKE 2 CAPSULES(40 MG) BY MOUTH DAILY  . finasteride (PROSCAR) 5 MG tablet TAKE 1 TABLET(5 MG) BY MOUTH DAILY  . fluticasone (FLONASE) 50 MCG/ACT nasal spray SHAKE LIQUID AND USE 2 SPRAYS IN EACH NOSTRIL DAILY AS NEEDED FOR ALLERGIES OR RHINITIS  . furosemide (LASIX) 40 MG tablet TAKE 1 TABLET BY MOUTH DAILY  . hydrALAZINE (APRESOLINE) 25 MG tablet Take 1.5 tablets (37.5 mg total) by mouth every 8 (eight) hours.  Marland Kitchen HYDROcodone-acetaminophen (NORCO) 7.5-325 MG tablet Take 1 tablet by mouth every 6 (six) hours as needed for moderate pain.  . isosorbide mononitrate (IMDUR) 30 MG 24 hr tablet TAKE 1 TABLET(30 MG) BY MOUTH DAILY  . metoprolol tartrate (LOPRESSOR) 25 MG tablet TAKE 1 TABLET BY MOUTH TWICE DAILY  . nitroGLYCERIN (NITROSTAT) 0.4 MG SL tablet PLACE 1 TABLET UNDER THE TONGUE EVERY 5 MINUTES FOR 3 DOSES AS NEEDED FOR CHEST PAIN  . pantoprazole (PROTONIX) 40 MG tablet TAKE 1 TABLET BY MOUTH EVERY DAY  . ranitidine (ZANTAC) 300 MG tablet TAKE 1 TABLET(300 MG) BY MOUTH AT BEDTIME  . rosuvastatin (CRESTOR) 10 MG tablet TAKE 1 TABLET BY MOUTH AT BEDTIME  . solifenacin (VESICARE) 5 MG tablet Take 5 mg by mouth daily. Reported  on 10/27/2015  . sucralfate (CARAFATE) 1 GM/10ML suspension Take 10 mLs (1 g total) by mouth 4 (four) times daily -  with meals and at bedtime.  . [DISCONTINUED] fluticasone (FLONASE) 50 MCG/ACT nasal spray SHAKE LIQUID AND USE 2 SPRAYS IN EACH NOSTRIL DAILY AS NEEDED FOR ALLERGIES OR RHINITIS  . [DISCONTINUED] isosorbide mononitrate (IMDUR) 30 MG 24 hr tablet Take 30 mg by mouth daily.   No facility-administered encounter medications on file as of 08/24/2016.     Allergies (verified) Bactrim [sulfamethoxazole-trimethoprim] and Penicillins   History: Past Medical History:  Diagnosis Date  . Arthritis   . Atrial fibrillation ([place])   . Benign prostatic hypertrophy   . BPH (benign prostatic hyperplasia)   . Chronic systolic heart failure (HCC)    a. EF 25-30% in past; b.Echo 11/08/11: Normal LV wall thickness, EF 45-50%. c. Echo 04/17: EF 15-20%, diffuse akinesis, moderate MR, mod dilated LA, trivial PE   . Coronary artery disease    a. Chronically occ RCA. cardiac cath 10/2010: Mid LAD 20-30%, distal LAD 40%, mid D1 40%, mid circumflex 20-30%, distal RCA occluded with left to right collaterals. c. 10/2015: cath- relatively stable dz since 2012.    Marland Kitchen Esophageal stricture   . GERD (gastroesophageal reflux disease)   . Hemorrhoids   . History of diverticulitis of colon   . History of nephrolithiasis   . HLD (  hyperlipidemia)   . HTN (hypertension)   . Ischemic cardiomyopathy    a. s/p ICD 2008   . Renal insufficiency    Renal failure in July 2012 secondary to hypotension, ARB use, and   . Skin cancer   . TIA (transient ischemic attack) 10/2011   carotids neg for ICA stenosis  . Urinary retention    foley indwelling 12/2010-04/2011   Past Surgical History:  Procedure Laterality Date  . CARDIAC CATHETERIZATION  11-05-10  . CARDIAC CATHETERIZATION N/A 10/14/2015   Procedure: Right/Left Heart Cath and Coronary Angiography;  Surgeon: Leonie Man, MD;  Location: [facility];   Service: Cardiovascular;  Laterality: N/A;  . CARDIAC DEFIBRILLATOR PLACEMENT  12/2006   AICD  . CATARACTS REMOVED    . EP IMPLANTABLE DEVICE N/A 11/11/2015   Procedure:  ICD Generator Changeout;  Surgeon: Evans Lance, MD;  Location: [facility];  Service: Cardiovascular;  Laterality: N/A;  . ESOPHAGOGASTRODUODENOSCOPY N/A 01/24/2014   Procedure: ESOPHAGOGASTRODUODENOSCOPY (EGD);  Surgeon: Milus Banister, MD;  Location: [facility];  Service: Endoscopy;  Laterality: N/A;  . ESOPHAGOGASTRODUODENOSCOPY N/A 02/14/2016   Procedure: ESOPHAGOGASTRODUODENOSCOPY (EGD);  Surgeon: Mauri Pole, MD;  Location: [facility];  Service: Endoscopy;  Laterality: N/A;  . JOINT REPLACEMENT    . TOTAL KNEE ARTHROPLASTY  01/04/11   L TKA  . TRANSURETHRAL RESECTION OF PROSTATE  06/14/2011   Procedure: TRANSURETHRAL RESECTION OF THE PROSTATE (TURP);  Surgeon: Bernestine Amass, MD;  Location: [facility];  Service: Urology;  Laterality: N/A;   Family History  Problem Relation Age of Onset  . Heart attack Mother 58    pt unsure of age  . Liver disease Father 72  . Heart disease Son     x 2  . Colon cancer Neg Hx    Social History   Occupational History  . retired    Social History Main Topics  . Smoking status: Former Smoker    Packs/day: 2.00    Years: 30.00    Quit date: 06/14/1968  . Smokeless tobacco: Never Used  . Alcohol use No  . Drug use: No  . Sexual activity: Not on file   Tobacco Counseling Counseling given: Not Answered   Activities of Daily Living In your present state of health, do you have any difficulty performing the following activities: 02/15/2016 02/15/2016  Hearing? - N  Vision? - N  Difficulty concentrating or making decisions? - N  Walking or climbing stairs? - Y  Dressing or bathing? - N  Doing errands, shopping? Y -  Conservation officer, nature and eating ? - -  Using the Toilet? - -  In the past six months, have you accidently leaked urine? - -  Do you have problems with  loss of bowel control? - -  Managing your Medications? - -  Managing your Finances? - -  Housekeeping or managing your Housekeeping? - -  Some recent data might be hidden    Immunizations and Health Maintenance Immunization History  Administered Date(s) Administered  . Influenza Split 02/24/2011, 07/24/2012  . Influenza Whole 03/14/2009, 02/23/2010  . Influenza, High Dose Seasonal PF 03/26/2013  . Influenza-Unspecified 03/15/2015  . Pneumococcal Polysaccharide-23 04/23/2009  . Td 04/23/2009   Health Maintenance Due  Topic Date Due  . PNA vac Low Risk Adult (2 of 2 - PCV13) 04/23/2010  . INFLUENZA VACCINE  01/13/2016    Patient Care Team: Binnie Rail, MD as PCP - General (Internal Medicine) Collier [place]  Tommy Rainwater, MD as Consulting Physician (Cardiology) Paralee Cancel, MD as Consulting Physician (Orthopedic Surgery) Rana Snare, MD as Consulting Physician (Urology) Suella Broad, MD as Consulting Physician (Physical Medicine and Rehabilitation) Verlin Grills, MD as Consulting Physician (Dermatology) Ladene Artist, MD (Gastroenterology)  Indicate any recent Medical Services you may have received from other than [facility] providers in the past year (date may be approximate).    Assessment:   This is a routine wellness examination for Jacob [name]. Physical assessment deferred to PCP.   Hearing/Vision screen No exam data present  Dietary issues and exercise activities discussed:   Diet (meal preparation, eat out, water intake, caffeinated beverages, dairy products, fruits and vegetables):  Breakfast: Lunch:  Dinner:      Goals    None     Depression Screen PHQ 2/9 Scores 11/05/2015 10/27/2015  PHQ - 2 Score 0 0    Fall Risk Fall Risk  11/05/2015 10/27/2015  Falls in the past year? Yes Yes  Number falls in past yr: 2 or more 2 or more  Injury with Fall? No No  Risk Factor Category  High Fall Risk High Fall Risk  Risk for fall due to : History of fall(s);Impaired  balance/gait;Impaired mobility;Mental status change Impaired balance/gait  Follow up Education provided;Falls prevention discussed Falls prevention discussed;Falls evaluation completed    Cognitive Function:        Screening Tests Health Maintenance  Topic Date Due  . PNA vac Low Risk Adult (2 of 2 - PCV13) 04/23/2010  . INFLUENZA VACCINE  01/13/2016  . TETANUS/TDAP  04/24/2019        Plan:     During the course of the visit Jacob [name] was educated and counseled about the following appropriate screening and preventive services:   Vaccines to include Pneumoccal, Influenza, Hepatitis B, Td, Zostavax, HCV  Colorectal cancer screening  Cardiovascular disease screening  Diabetes screening  Glaucoma screening  Nutrition counseling  Prostate cancer screening  Patient Instructions (the written plan) were given to the patient.   Michiel Cowboy, RN   08/24/2016

## 2016-08-24 NOTE — Progress Notes (Signed)
Pre visit review using our clinic review tool, if applicable. No additional management support is needed unless otherwise documented below in the visit note. 

## 2016-08-24 NOTE — Progress Notes (Signed)
Subjective:    Patient ID: Jacob [name], male    DOB: Nov 22, 1931, 83 y.o.   MRN: [id number]  HPI   error  Medications and allergies reviewed with patient and updated if appropriate.  Patient Active Problem List   Diagnosis Date Noted  . Anemia 02/23/2016  . Aspiration pneumonitis ([place]) 02/15/2016  . Hypernatremia 02/15/2016  . CKD (chronic kidney disease), stage III 02/15/2016  . Acute on chronic combined systolic and diastolic congestive heart failure ([place]) 10/14/2015  . Atrial fibrillation with RVR ([place])   . Cardiomyopathy, ischemic   . Coronary artery disease involving native heart   . Acute on chronic systolic (congestive) heart failure 10/11/2015  . Diplopia 10/11/2015  . Depression 07/07/2015  . Esophageal obstruction due to food impaction 01/24/2014  . Syncope 02/22/2013  . Cardiomyopathy ([place]) 02/22/2013  . CAD (coronary artery disease) 02/22/2013  . GERD (gastroesophageal reflux disease) 02/22/2013  . OA (osteoarthritis) of knee 02/22/2013  . TIA (transient ischemic attack) 11/07/2011  . Urinary retention 04/13/2011  . NEVUS, ATYPICAL 08/26/2010  . ECZEMA 05/27/2010  . Atrial fibrillation ([place]) 04/29/2010  . ROTATOR CUFF INJURY, RIGHT SHOULDER 09/16/2009  . Essential hypertension 04/23/2009  . BENIGN PROSTATIC HYPERTROPHY, WITH OBSTRUCTION 04/23/2009  . ARTHRITIS 04/23/2009  . HYPERCHOLESTEROLEMIA, MIXED 04/22/2008  . CORONARY ATHEROSCLEROSIS, NATIVE VESSEL 04/22/2008  . Acute combined systolic and diastolic heart failure ([place]) 04/22/2008  . Automatic implantable cardioverter-defibrillator in situ 04/22/2008    Current Outpatient Prescriptions on File Prior to Visit  Medication Sig Dispense Refill  . dabigatran (PRADAXA) 150 MG CAPS capsule Take 1 capsule (150 mg total) by mouth 2 (two) times daily. 180 capsule 1  . DULoxetine (CYMBALTA) 20 MG capsule TAKE 2 CAPSULES(40 MG) BY MOUTH DAILY 180 capsule 1  . finasteride (PROSCAR) 5 MG tablet TAKE 1 TABLET(5  MG) BY MOUTH DAILY 90 tablet 3  . fluticasone (FLONASE) 50 MCG/ACT nasal spray SHAKE LIQUID AND USE 2 SPRAYS IN EACH NOSTRIL DAILY AS NEEDED FOR ALLERGIES OR RHINITIS 16 g 1  . fluticasone (FLONASE) 50 MCG/ACT nasal spray SHAKE LIQUID AND USE 2 SPRAYS IN EACH NOSTRIL DAILY AS NEEDED FOR ALLERGIES OR RHINITIS 16 g 0  . furosemide (LASIX) 40 MG tablet TAKE 1 TABLET BY MOUTH DAILY 30 tablet 8  . hydrALAZINE (APRESOLINE) 25 MG tablet Take 1.5 tablets (37.5 mg total) by mouth every 8 (eight) hours. 120 tablet 6  . HYDROcodone-acetaminophen (NORCO) 7.5-325 MG tablet Take 1 tablet by mouth every 6 (six) hours as needed for moderate pain.    . isosorbide mononitrate (IMDUR) 30 MG 24 hr tablet Take 30 mg by mouth daily.    . isosorbide mononitrate (IMDUR) 30 MG 24 hr tablet TAKE 1 TABLET(30 MG) BY MOUTH DAILY 90 tablet 0  . metoprolol tartrate (LOPRESSOR) 25 MG tablet TAKE 1 TABLET BY MOUTH TWICE DAILY 60 tablet 4  . nitroGLYCERIN (NITROSTAT) 0.4 MG SL tablet PLACE 1 TABLET UNDER THE TONGUE EVERY 5 MINUTES FOR 3 DOSES AS NEEDED FOR CHEST PAIN 275 tablet 0  . pantoprazole (PROTONIX) 40 MG tablet TAKE 1 TABLET BY MOUTH EVERY DAY 30 tablet 1  . ranitidine (ZANTAC) 300 MG tablet TAKE 1 TABLET(300 MG) BY MOUTH AT BEDTIME 90 tablet 3  . rosuvastatin (CRESTOR) 10 MG tablet TAKE 1 TABLET BY MOUTH AT BEDTIME 30 tablet 8  . solifenacin (VESICARE) 5 MG tablet Take 5 mg by mouth daily. Reported on 10/27/2015    . sucralfate (CARAFATE) 1 GM/10ML suspension Take 10  mLs (1 g total) by mouth 4 (four) times daily -  with meals and at bedtime. 420 mL 0   No current facility-administered medications on file prior to visit.     Past Medical History:  Diagnosis Date  . Arthritis   . Atrial fibrillation ([place])   . Benign prostatic hypertrophy   . BPH (benign prostatic hyperplasia)   . Chronic systolic heart failure (HCC)    a. EF 25-30% in past; b.Echo 11/08/11: Normal LV wall thickness, EF 45-50%. c. Echo 04/17: EF 15-20%,  diffuse akinesis, moderate MR, mod dilated LA, trivial PE   . Coronary artery disease    a. Chronically occ RCA. cardiac cath 10/2010: Mid LAD 20-30%, distal LAD 40%, mid D1 40%, mid circumflex 20-30%, distal RCA occluded with left to right collaterals. c. 10/2015: cath- relatively stable dz since 2012.    Marland Kitchen Esophageal stricture   . GERD (gastroesophageal reflux disease)   . Hemorrhoids   . History of diverticulitis of colon   . History of nephrolithiasis   . HLD (hyperlipidemia)   . HTN (hypertension)   . Ischemic cardiomyopathy    a. s/p ICD 2008   . Renal insufficiency    Renal failure in July 2012 secondary to hypotension, ARB use, and   . Skin cancer   . TIA (transient ischemic attack) 10/2011   carotids neg for ICA stenosis  . Urinary retention    foley indwelling 12/2010-04/2011    Past Surgical History:  Procedure Laterality Date  . CARDIAC CATHETERIZATION  11-05-10  . CARDIAC CATHETERIZATION N/A 10/14/2015   Procedure: Right/Left Heart Cath and Coronary Angiography;  Surgeon: Leonie Man, MD;  Location: [facility];  Service: Cardiovascular;  Laterality: N/A;  . CARDIAC DEFIBRILLATOR PLACEMENT  12/2006   AICD  . CATARACTS REMOVED    . EP IMPLANTABLE DEVICE N/A 11/11/2015   Procedure:  ICD Generator Changeout;  Surgeon: Evans Lance, MD;  Location: [facility];  Service: Cardiovascular;  Laterality: N/A;  . ESOPHAGOGASTRODUODENOSCOPY N/A 01/24/2014   Procedure: ESOPHAGOGASTRODUODENOSCOPY (EGD);  Surgeon: Milus Banister, MD;  Location: [facility];  Service: Endoscopy;  Laterality: N/A;  . ESOPHAGOGASTRODUODENOSCOPY N/A 02/14/2016   Procedure: ESOPHAGOGASTRODUODENOSCOPY (EGD);  Surgeon: Mauri Pole, MD;  Location: [facility];  Service: Endoscopy;  Laterality: N/A;  . JOINT REPLACEMENT    . TOTAL KNEE ARTHROPLASTY  01/04/11   L TKA  . TRANSURETHRAL RESECTION OF PROSTATE  06/14/2011   Procedure: TRANSURETHRAL RESECTION OF THE PROSTATE (TURP);  Surgeon:  Bernestine Amass, MD;  Location: [facility];  Service: Urology;  Laterality: N/A;    Social History   Social History  . Marital status: Married    Spouse name: N/A  . Number of children: N/A  . Years of education: N/A   Occupational History  . retired    Social History Main Topics  . Smoking status: Former Smoker    Packs/day: 2.00    Years: 30.00    Quit date: 06/14/1968  . Smokeless tobacco: Never Used  . Alcohol use No  . Drug use: No  . Sexual activity: Not on file   Other Topics Concern  . Not on file   Social History Narrative   Daily caffeine     Family History  Problem Relation Age of Onset  . Heart attack Mother 2    pt unsure of age  . Liver disease Father 6  . Heart disease Son     x 2  .  Colon cancer Neg Hx     Review of Systems     Objective:  There were no vitals filed for this visit. Wt Readings from Last 3 Encounters:  02/23/16 156 lb (70.8 kg)  02/15/16 154 lb 9.6 oz (70.1 kg)  02/14/16 160 lb (72.6 kg)   There is no height or weight on file to calculate BMI.   Physical Exam         Assessment & Plan:    See Problem List for Assessment and Plan of chronic medical problems.    This encounter was created in error - please disregard.

## 2016-09-07 DIAGNOSIS — G8929 Other chronic pain: Secondary | ICD-10-CM | POA: Diagnosis not present

## 2016-09-07 DIAGNOSIS — M1711 Unilateral primary osteoarthritis, right knee: Secondary | ICD-10-CM | POA: Diagnosis not present

## 2016-09-07 DIAGNOSIS — M25561 Pain in right knee: Secondary | ICD-10-CM | POA: Diagnosis not present

## 2016-09-20 DIAGNOSIS — H01002 Unspecified blepharitis right lower eyelid: Secondary | ICD-10-CM | POA: Diagnosis not present

## 2016-09-20 DIAGNOSIS — H01005 Unspecified blepharitis left lower eyelid: Secondary | ICD-10-CM | POA: Diagnosis not present

## 2016-09-27 DIAGNOSIS — M1711 Unilateral primary osteoarthritis, right knee: Secondary | ICD-10-CM | POA: Diagnosis not present

## 2016-09-30 ENCOUNTER — Other Ambulatory Visit: Payer: Self-pay | Admitting: Orthopedic Surgery

## 2016-10-14 NOTE — H&P (Signed)
Jacob KNEE ADMISSION H&P  Patient is being admitted for right Jacob knee arthroplasty.  Subjective:  Chief Complaint:   Right knee primary OA /pain  HPI: Jacob [name], 83 y.o. male, has a history of pain and functional disability in the right knee due to arthritis and has failed non-surgical conservative treatments for greater than 12 weeks to includeNSAID's and/or analgesics, corticosteriod injections, viscosupplementation injections, use of assistive devices and activity modification.  Onset of symptoms was gradual, starting >10 years ago with gradually worsening course since that time. The patient noted no past surgery on the right knee(s).  Patient currently rates pain in the right knee(s) at 9 out of 10 with activity. Patient has night pain, worsening of pain with activity and weight bearing, pain that interferes with activities of daily living, pain with passive range of motion, crepitus and joint swelling.  Patient has evidence of periarticular osteophytes and joint space narrowing by imaging studies.  There is no active infection.  Risks, benefits and expectations were discussed with the patient.  Risks including but not limited to the risk of anesthesia, blood clots, nerve damage, blood vessel damage, failure of the prosthesis, infection and up to and including death.  Patient understand the risks, benefits and expectations and wishes to proceed with surgery.   PCP: Jacob Rail, MD  D/C Plans:       Home   Post-op Meds:       No Rx given  Tranexamic Acid:      To be given - IV   Decadron:      Is to be given  FYI:     Check pre-op meds  Norco  DME:   Rx given for - RW and 3-n-1  PT:   OPPT  - set up   Patient Active Problem List   Diagnosis Date Noted  . Anemia 02/23/2016  . Aspiration pneumonitis ([place]) 02/15/2016  . CKD (chronic kidney disease), stage III 02/15/2016  . Atrial fibrillation with RVR ([place])   . Cardiomyopathy, ischemic   . Diplopia 10/11/2015  .  Depression 07/07/2015  . Esophageal obstruction due to food impaction 01/24/2014  . Syncope 02/22/2013  . CAD (coronary artery disease) 02/22/2013  . GERD (gastroesophageal reflux disease) 02/22/2013  . OA (osteoarthritis) of knee 02/22/2013  . TIA (transient ischemic attack) 11/07/2011  . Urinary retention 04/13/2011  . NEVUS, ATYPICAL 08/26/2010  . ECZEMA 05/27/2010  . Atrial fibrillation ([place]) 04/29/2010  . ROTATOR CUFF INJURY, RIGHT SHOULDER 09/16/2009  . Essential hypertension 04/23/2009  . BENIGN PROSTATIC HYPERTROPHY, WITH OBSTRUCTION 04/23/2009  . ARTHRITIS 04/23/2009  . HYPERCHOLESTEROLEMIA, MIXED 04/22/2008  . Automatic implantable cardioverter-defibrillator in situ 04/22/2008   Past Medical History:  Diagnosis Date  . Arthritis   . Atrial fibrillation ([place])   . Benign prostatic hypertrophy   . BPH (benign prostatic hyperplasia)   . Chronic systolic heart failure (HCC)    a. EF 25-30% in past; b.Echo 11/08/11: Normal LV wall thickness, EF 45-50%. c. Echo 04/17: EF 15-20%, diffuse akinesis, moderate MR, mod dilated LA, trivial PE   . Coronary artery disease    a. Chronically occ RCA. cardiac cath 10/2010: Mid LAD 20-30%, distal LAD 40%, mid D1 40%, mid circumflex 20-30%, distal RCA occluded with left to right collaterals. c. 10/2015: cath- relatively stable dz since 2012.    Marland Kitchen Esophageal stricture   . GERD (gastroesophageal reflux disease)   . Hemorrhoids   . History of diverticulitis of colon   . History of  nephrolithiasis   . HLD (hyperlipidemia)   . HTN (hypertension)   . Ischemic cardiomyopathy    a. s/p ICD 2008   . Renal insufficiency    Renal failure in July 2012 secondary to hypotension, ARB use, and   . Skin cancer   . TIA (transient ischemic attack) 10/2011   carotids neg for ICA stenosis  . Urinary retention    foley indwelling 12/2010-04/2011    Past Surgical History:  Procedure Laterality Date  . CARDIAC CATHETERIZATION  11-05-10  . CARDIAC  CATHETERIZATION N/A 10/14/2015   Procedure: Right/Left Heart Cath and Coronary Angiography;  Surgeon: Jacob Man, MD;  Location: [facility];  Service: Cardiovascular;  Laterality: N/A;  . CARDIAC DEFIBRILLATOR PLACEMENT  12/2006   AICD  . CATARACTS REMOVED    . EP IMPLANTABLE DEVICE N/A 11/11/2015   Procedure:  ICD Generator Changeout;  Surgeon: Jacob Lance, MD;  Location: [facility];  Service: Cardiovascular;  Laterality: N/A;  . ESOPHAGOGASTRODUODENOSCOPY N/A 01/24/2014   Procedure: ESOPHAGOGASTRODUODENOSCOPY (EGD);  Surgeon: Jacob Banister, MD;  Location: [facility];  Service: Endoscopy;  Laterality: N/A;  . ESOPHAGOGASTRODUODENOSCOPY N/A 02/14/2016   Procedure: ESOPHAGOGASTRODUODENOSCOPY (EGD);  Surgeon: Jacob Pole, MD;  Location: [facility];  Service: Endoscopy;  Laterality: N/A;  . JOINT REPLACEMENT    . Jacob KNEE ARTHROPLASTY  01/04/11   L TKA  . TRANSURETHRAL RESECTION OF PROSTATE  06/14/2011   Procedure: TRANSURETHRAL RESECTION OF THE PROSTATE (TURP);  Surgeon: Jacob Amass, MD;  Location: [facility];  Service: Urology;  Laterality: N/A;    No prescriptions prior to admission.   Allergies  Allergen Reactions  . Bactrim [Sulfamethoxazole-Trimethoprim] Other (See Comments)    Throat swelling  . Penicillins Itching, Swelling and Rash    Has patient had a PCN reaction causing immediate rash, facial/tongue/throat swelling, SOB or lightheadedness with hypotension:unsure Has patient had a PCN reaction causing severe rash involving mucus membranes or skin necrosis:No Has patient had a PCN reaction that required hospitalization:No Has patient had a PCN reaction occurring within the last 10 years:No If all of the above answers are "NO", then may proceed with Cephalosporin use.     Social History  Substance Use Topics  . Smoking status: Former Smoker    Packs/day: 2.00    Years: 30.00    Quit date: 06/14/1968  . Smokeless tobacco: Never Used  . Alcohol  use No    Family History  Problem Relation Age of Onset  . Heart attack Mother 62    pt unsure of age  . Liver disease Father 9  . Heart disease Son     x 2  . Colon cancer Neg Hx      Review of Systems  Constitutional: Negative.   HENT: Negative.   Eyes: Positive for double vision.  Respiratory: Negative.   Cardiovascular: Negative.   Gastrointestinal: Positive for heartburn.  Genitourinary: Positive for frequency and urgency.  Musculoskeletal: Positive for joint pain.  Skin: Negative.   Neurological: Positive for dizziness.  Endo/Heme/Allergies: Negative.   Psychiatric/Behavioral: Positive for depression.    Objective:  Physical Exam  Constitutional: He is oriented to person, place, and time. He appears well-developed.  HENT:  Head: Normocephalic.  Eyes: Pupils are equal, round, and reactive to light.  Neck: Neck supple. No JVD present. No tracheal deviation present. No thyromegaly present.  Cardiovascular: Normal rate, regular rhythm and intact distal pulses.   Respiratory: Effort normal and breath sounds normal. No  respiratory distress. He has no wheezes.  GI: Soft. There is no tenderness. There is no guarding.  Musculoskeletal:       Right knee: He exhibits decreased range of motion, swelling and bony tenderness. He exhibits no ecchymosis, no deformity, no laceration and no erythema. Tenderness found.  Lymphadenopathy:    He has no cervical adenopathy.  Neurological: He is alert and oriented to person, place, and time.  Skin: Skin is warm and dry.  Psychiatric: He has a normal mood and affect.      Labs:  Estimated body mass index is 23.72 kg/m as calculated from the following:   Height as of 02/15/16: 5\' 8"  (1.727 m).   Weight as of 02/23/16: 70.8 kg (156 lb).   Imaging Review Plain radiographs demonstrate severe degenerative joint disease of the right knee(s). The bone quality appears to be good for age and reported activity  level.  Assessment/Plan:  End stage arthritis, right knee   The patient history, physical examination, clinical judgment of the provider and imaging studies are consistent with end stage degenerative joint disease of the right knee(s) and Jacob knee arthroplasty is deemed medically necessary. The treatment options including medical management, injection therapy arthroscopy and arthroplasty were discussed at length. The risks and benefits of Jacob knee arthroplasty were presented and reviewed. The risks due to aseptic loosening, infection, stiffness, patella tracking problems, thromboembolic complications and other imponderables were discussed. The patient acknowledged the explanation, agreed to proceed with the plan and consent was signed. Patient is being admitted for inpatient treatment for surgery, pain control, PT, OT, prophylactic antibiotics, VTE prophylaxis, progressive ambulation and ADL's and discharge planning. The patient is planning to be discharged home.      [place] Emilie Carp   PA-C  10/14/2016, 10:11 AM

## 2016-10-17 ENCOUNTER — Other Ambulatory Visit: Payer: Self-pay | Admitting: Internal Medicine

## 2016-10-18 ENCOUNTER — Telehealth: Payer: Self-pay | Admitting: Internal Medicine

## 2016-10-18 NOTE — Telephone Encounter (Signed)
Patient son dropping off a form for his dad to be cleared for surgery. Please fax to [facility] [phone]. Please advise if patient needs an appointment. Thank you.

## 2016-10-18 NOTE — Telephone Encounter (Signed)
Pt has not been seen since 02/23/16. Does he need an appt?

## 2016-10-18 NOTE — Telephone Encounter (Signed)
Needs appt for clearance

## 2016-10-19 NOTE — Patient Instructions (Signed)
XIOMAR CROMPTON  10/19/2016   Your procedure is scheduled on:   10/26/16   Report to [facility] Main  Entrance                   Report to admitting at   0730 AM   Call this number if you have problems the morning of surgery     [phone]   Remember: ONLY 1 PERSON MAY GO WITH YOU TO SHORT STAY TO GET  READY MORNING OF YOUR SURGERY.  Do not eat food or drink liquids :After Midnight.     Take these medicines the morning of surgery with A SIP OF WATER:  pantaprazole, metoprolol, isorbide mononitrate, duloxentine                                 You may not have any metal on your body including hair pins and              piercings  Do not wear jewelry,, lotions, powders or perfumes, deodorant                        Men may shave face and neck.   Do not bring valuables to the hospital. [place].  Contacts, dentures or bridgework may not be worn into surgery.  Leave suitcase in the car. After surgery it may be brought to your room.     Patients discharged the day of surgery will not be allowed to drive home.  Name and phone number of your driver:  Special Instructions: N/A              Please read over the following fact sheets you were given: _____________________________________________________________________             [facility] - Preparing for Surgery Before surgery, you can play an important role.  Because skin is not sterile, your skin needs to be as free of germs as possible.  You can reduce the number of germs on your skin by washing with CHG (chlorahexidine gluconate) soap before surgery.  CHG is an antiseptic cleaner which kills germs and bonds with the skin to continue killing germs even after washing. Please DO NOT use if you have an allergy to CHG or antibacterial soaps.  If your skin becomes reddened/irritated stop using the CHG and inform your nurse when you arrive at Short Stay. Do not  shave (including legs and underarms) for at least 48 hours prior to the first CHG shower.  You may shave your face/neck. Please follow these instructions carefully:  1.  Shower with CHG Soap the night before surgery and the  morning of Surgery.  2.  If you choose to wash your hair, wash your hair first as usual with your  normal  shampoo.  3.  After you shampoo, rinse your hair and body thoroughly to remove the  shampoo.                           4.  Use CHG as you would any other liquid soap.  You can apply chg directly  to the skin and wash  Gently with a scrungie or clean washcloth.  5.  Apply the CHG Soap to your body ONLY FROM THE NECK DOWN.   Do not use on face/ open                           Wound or open sores. Avoid contact with eyes, ears mouth and genitals (private parts).                       Wash face,  Genitals (private parts) with your normal soap.             6.  Wash thoroughly, paying special attention to the area where your surgery  will be performed.  7.  Thoroughly rinse your body with warm water from the neck down.  8.  DO NOT shower/wash with your normal soap after using and rinsing off  the CHG Soap.                9.  Pat yourself dry with a clean towel.            10.  Wear clean pajamas.            11.  Place clean sheets on your bed the night of your first shower and do not  sleep with pets. Day of Surgery : Do not apply any lotions/deodorants the morning of surgery.  Please wear clean clothes to the hospital/surgery center.  FAILURE TO FOLLOW THESE INSTRUCTIONS MAY RESULT IN THE CANCELLATION OF YOUR SURGERY PATIENT SIGNATURE_________________________________  NURSE SIGNATURE__________________________________  ________________________________________________________________________  WHAT IS A BLOOD TRANSFUSION? Blood Transfusion Information  A transfusion is the replacement of blood or some of its parts. Blood is made up of multiple cells  which provide different functions.  Red blood cells carry oxygen and are used for blood loss replacement.  White blood cells fight against infection.  Platelets control bleeding.  Plasma helps clot blood.  Other blood products are available for specialized needs, such as hemophilia or other clotting disorders. BEFORE THE TRANSFUSION  Who gives blood for transfusions?   Healthy volunteers who are fully evaluated to make sure their blood is safe. This is blood bank blood. Transfusion therapy is the safest it has ever been in the practice of medicine. Before blood is taken from a donor, a complete history is taken to make sure that person has no history of diseases nor engages in risky social behavior (examples are intravenous drug use or sexual activity with multiple partners). The donor's travel history is screened to minimize risk of transmitting infections, such as malaria. The donated blood is tested for signs of infectious diseases, such as HIV and hepatitis. The blood is then tested to be sure it is compatible with you in order to minimize the chance of a transfusion reaction. If you or a relative donates blood, this is often done in anticipation of surgery and is not appropriate for emergency situations. It takes many days to process the donated blood. RISKS AND COMPLICATIONS Although transfusion therapy is very safe and saves many lives, the main dangers of transfusion include:   Getting an infectious disease.  Developing a transfusion reaction. This is an allergic reaction to something in the blood you were given. Every precaution is taken to prevent this. The decision to have a blood transfusion has been considered carefully by your caregiver before blood is given. Blood is not given unless the benefits outweigh  the risks. AFTER THE TRANSFUSION  Right after receiving a blood transfusion, you will usually feel much better and more energetic. This is especially true if your red blood  cells have gotten low (anemic). The transfusion raises the level of the red blood cells which carry oxygen, and this usually causes an energy increase.  The nurse administering the transfusion will monitor you carefully for complications. HOME CARE INSTRUCTIONS  No special instructions are needed after a transfusion. You may find your energy is better. Speak with your caregiver about any limitations on activity for underlying diseases you may have. SEEK MEDICAL CARE IF:   Your condition is not improving after your transfusion.  You develop redness or irritation at the intravenous (IV) site. SEEK IMMEDIATE MEDICAL CARE IF:  Any of the following symptoms occur over the next 12 hours:  Shaking chills.  You have a temperature by mouth above 102 F (38.9 C), not controlled by medicine.  Chest, back, or muscle pain.  People around you feel you are not acting correctly or are confused.  Shortness of breath or difficulty breathing.  Dizziness and fainting.  You get a rash or develop hives.  You have a decrease in urine output.  Your urine turns a dark color or changes to pink, red, or brown. Any of the following symptoms occur over the next 10 days:  You have a temperature by mouth above 102 F (38.9 C), not controlled by medicine.  Shortness of breath.  Weakness after normal activity.  The white part of the eye turns yellow (jaundice).  You have a decrease in the amount of urine or are urinating less often.  Your urine turns a dark color or changes to pink, red, or brown. Document Released: 05/28/2000 Document Revised: 08/23/2011 Document Reviewed: 01/15/2008 ExitCare Patient Information 2014 [place].  _______________________________________________________________________  Incentive Spirometer  An incentive spirometer is a tool that can help keep your lungs clear and active. This tool measures how well you are filling your lungs with each breath. Taking long deep  breaths may help reverse or decrease the chance of developing breathing (pulmonary) problems (especially infection) following:  A long period of time when you are unable to move or be active. BEFORE THE PROCEDURE   If the spirometer includes an indicator to show your best effort, your nurse or respiratory therapist will set it to a desired goal.  If possible, sit up straight or lean slightly forward. Try not to slouch.  Hold the incentive spirometer in an upright position. INSTRUCTIONS FOR USE  1. Sit on the edge of your bed if possible, or sit up as far as you can in bed or on a chair. 2. Hold the incentive spirometer in an upright position. 3. Breathe out normally. 4. Place the mouthpiece in your mouth and seal your lips tightly around it. 5. Breathe in slowly and as deeply as possible, raising the piston or the ball toward the top of the column. 6. Hold your breath for 3-5 seconds or for as long as possible. Allow the piston or ball to fall to the bottom of the column. 7. Remove the mouthpiece from your mouth and breathe out normally. 8. Rest for a few seconds and repeat Steps 1 through 7 at least 10 times every 1-2 hours when you are awake. Take your time and take a few normal breaths between deep breaths. 9. The spirometer may include an indicator to show your best effort. Use the indicator as a goal to work  toward during each repetition. 10. After each set of 10 deep breaths, practice coughing to be sure your lungs are clear. If you have an incision (the cut made at the time of surgery), support your incision when coughing by placing a pillow or rolled up towels firmly against it. Once you are able to get out of bed, walk around indoors and cough well. You may stop using the incentive spirometer when instructed by your caregiver.  RISKS AND COMPLICATIONS  Take your time so you do not get dizzy or light-headed.  If you are in pain, you may need to take or ask for pain medication before  doing incentive spirometry. It is harder to take a deep breath if you are having pain. AFTER USE  Rest and breathe slowly and easily.  It can be helpful to keep track of a log of your progress. Your caregiver can provide you with a simple table to help with this. If you are using the spirometer at home, follow these instructions: [place] IF:   You are having difficultly using the spirometer.  You have trouble using the spirometer as often as instructed.  Your pain medication is not giving enough relief while using the spirometer.  You develop fever of 100.5 F (38.1 C) or higher. SEEK IMMEDIATE MEDICAL CARE IF:   You cough up bloody sputum that had not been present before.  You develop fever of 102 F (38.9 C) or greater.  You develop worsening pain at or near the incision site. MAKE SURE YOU:   Understand these instructions.  Will watch your condition.  Will get help right away if you are not doing well or get worse. Document Released: 10/11/2006 Document Revised: 08/23/2011 Document Reviewed: 12/12/2006 [facility] Patient Information 2014 [place], [place].   ________________________________________________________________________

## 2016-10-19 NOTE — Telephone Encounter (Signed)
Spoke with pts daughter, scheduled appt for surgical clearance.

## 2016-10-20 ENCOUNTER — Other Ambulatory Visit: Payer: Self-pay | Admitting: Internal Medicine

## 2016-10-20 ENCOUNTER — Encounter (HOSPITAL_COMMUNITY)
Admission: RE | Admit: 2016-10-20 | Discharge: 2016-10-20 | Disposition: A | Discharge status: Home / Self Care | Payer: Medicare Other | Source: Ambulatory Visit | Attending: Orthopedic Surgery | Admitting: Orthopedic Surgery

## 2016-10-20 ENCOUNTER — Ambulatory Visit (INDEPENDENT_AMBULATORY_CARE_PROVIDER_SITE_OTHER): Payer: Medicare Other | Admitting: Internal Medicine

## 2016-10-20 ENCOUNTER — Encounter: Payer: Self-pay | Admitting: *Deleted

## 2016-10-20 ENCOUNTER — Encounter (HOSPITAL_COMMUNITY): Payer: Self-pay

## 2016-10-20 VITALS — BP 160/80 | HR 88 | Temp 98.1°F | Resp 16 | Wt 164.0 lb

## 2016-10-20 DIAGNOSIS — I255 Ischemic cardiomyopathy: Secondary | ICD-10-CM | POA: Diagnosis not present

## 2016-10-20 DIAGNOSIS — Z01818 Encounter for other preprocedural examination: Secondary | ICD-10-CM | POA: Diagnosis not present

## 2016-10-20 DIAGNOSIS — R9431 Abnormal electrocardiogram [ECG] [EKG]: Secondary | ICD-10-CM | POA: Insufficient documentation

## 2016-10-20 DIAGNOSIS — Z0183 Encounter for blood typing: Secondary | ICD-10-CM | POA: Insufficient documentation

## 2016-10-20 DIAGNOSIS — I10 Essential (primary) hypertension: Secondary | ICD-10-CM | POA: Diagnosis not present

## 2016-10-20 DIAGNOSIS — Z23 Encounter for immunization: Secondary | ICD-10-CM | POA: Diagnosis not present

## 2016-10-20 DIAGNOSIS — Z01812 Encounter for preprocedural laboratory examination: Secondary | ICD-10-CM | POA: Diagnosis not present

## 2016-10-20 DIAGNOSIS — M1711 Unilateral primary osteoarthritis, right knee: Secondary | ICD-10-CM

## 2016-10-20 HISTORY — DX: Pneumonia, unspecified organism: J18.9

## 2016-10-20 HISTORY — DX: Presence of automatic (implantable) cardiac defibrillator: Z95.810

## 2016-10-20 HISTORY — DX: Cerebral infarction, unspecified: I63.9

## 2016-10-20 HISTORY — DX: Personal history of urinary calculi: Z87.442

## 2016-10-20 HISTORY — DX: Depression, unspecified: F32.A

## 2016-10-20 HISTORY — DX: Major depressive disorder, single episode, unspecified: F32.9

## 2016-10-20 LAB — CBC
HCT: 37.2 % — ABNORMAL LOW (ref 39.0–52.0)
Hemoglobin: 11.8 g/dL — ABNORMAL LOW (ref 13.0–17.0)
MCH: 28 pg (ref 26.0–34.0)
MCHC: 31.7 g/dL (ref 30.0–36.0)
MCV: 88.4 fL (ref 78.0–100.0)
Platelets: 139 10*3/uL — ABNORMAL LOW (ref 150–400)
RBC: 4.21 MIL/uL — ABNORMAL LOW (ref 4.22–5.81)
RDW: 15.1 % (ref 11.5–15.5)
WBC: 6.6 10*3/uL (ref 4.0–10.5)

## 2016-10-20 LAB — BASIC METABOLIC PANEL
Anion gap: 4 — ABNORMAL LOW (ref 5–15)
BUN: 21 mg/dL — AB (ref 6–20)
CALCIUM: 9 mg/dL (ref 8.9–10.3)
CO2: 28 mmol/L (ref 22–32)
CREATININE: 1.42 mg/dL — AB (ref 0.61–1.24)
Chloride: 113 mmol/L — ABNORMAL HIGH (ref 101–111)
GFR calc Af Amer: 51 mL/min — ABNORMAL LOW (ref >60)
GFR, EST NON AFRICAN AMERICAN: 44 mL/min — AB (ref >60)
Glucose, Bld: 92 mg/dL (ref 65–99)
Potassium: 4.4 mmol/L (ref 3.5–5.1)
SODIUM: 145 mmol/L (ref 135–145)

## 2016-10-20 LAB — SURGICAL PCR SCREEN
MRSA, PCR: NEGATIVE
STAPHYLOCOCCUS AUREUS: NEGATIVE

## 2016-10-20 NOTE — Progress Notes (Signed)
Subjective:    Patient ID: Jacob [name], male    DOB: [birth year], 83 y.o.   MRN: [id number]  HPI The patient is here for medical clearance for surgery.   He is here at the request of Dr. Alvan Dame for medical clearance for right total knee replacement scheduled for 10/26/16.  Hypertension: He does take his 2 blood pressure medications daily, but states he did not take them today. He states he does not take the Lasix and has not taken it 2-3 months. He does not like how it increases his urination did not feel that he needed it. He does not monitor his blood pressure at home. He has been experiencing occasional chest pain and takes his nitroglycerin when this occurs. The nitroglycerin will relieve the chest pain. The chest pain does not always occur with exertion. He does not feel that the chest pain isn't increasing in frequency. He denies any shortness of breath, palpitations, lower extremity edema and lightheadedness.   He does not exercise on a regular basis and is very sedentary. His only activity during the day is walking around the house for his ADLs.   Medications and allergies reviewed with patient and updated if appropriate.  Patient Active Problem List   Diagnosis Date Noted  . Anemia 02/23/2016  . Aspiration pneumonitis ([place]) 02/15/2016  . CKD (chronic kidney disease), stage III 02/15/2016  . Atrial fibrillation with RVR ([place])   . Cardiomyopathy, ischemic   . Diplopia 10/11/2015  . Depression 07/07/2015  . Esophageal obstruction due to food impaction 01/24/2014  . Syncope 02/22/2013  . CAD (coronary artery disease) 02/22/2013  . GERD (gastroesophageal reflux disease) 02/22/2013  . OA (osteoarthritis) of knee 02/22/2013  . TIA (transient ischemic attack) 11/07/2011  . Urinary retention 04/13/2011  . NEVUS, ATYPICAL 08/26/2010  . ECZEMA 05/27/2010  . Atrial fibrillation ([place]) 04/29/2010  . ROTATOR CUFF INJURY, RIGHT SHOULDER 09/16/2009  . Essential hypertension 04/23/2009    . BENIGN PROSTATIC HYPERTROPHY, WITH OBSTRUCTION 04/23/2009  . ARTHRITIS 04/23/2009  . HYPERCHOLESTEROLEMIA, MIXED 04/22/2008  . Automatic implantable cardioverter-defibrillator in situ 04/22/2008    Current Outpatient Prescriptions on File Prior to Visit  Medication Sig Dispense Refill  . dabigatran (PRADAXA) 150 MG CAPS capsule Take 1 capsule (150 mg total) by mouth 2 (two) times daily. 180 capsule 1  . DULoxetine (CYMBALTA) 20 MG capsule Take 2 capsules (40 mg total) by mouth daily. --- Office visit needed for further refills (Patient taking differently: Take 20 mg by mouth 2 (two) times daily. ) 180 capsule 0  . finasteride (PROSCAR) 5 MG tablet TAKE 1 TABLET(5 MG) BY MOUTH DAILY (Patient taking differently: TAKE 1 TABLET(5 MG) BY MOUTH DAILY WITH LUNCH) 90 tablet 3  . fluticasone (FLONASE) 50 MCG/ACT nasal spray SHAKE LIQUID AND USE 2 SPRAYS IN EACH NOSTRIL DAILY AS NEEDED FOR ALLERGIES OR RHINITIS 16 g 0  . furosemide (LASIX) 40 MG tablet TAKE 1 TABLET BY MOUTH DAILY (Patient taking differently: TAKE 1 TABLET BY MOUTH DAILY IN THE MORNING) 30 tablet 8  . HYDROcodone-acetaminophen (NORCO) 7.5-325 MG tablet Take 1 tablet by mouth every 6 (six) hours.     . isosorbide mononitrate (IMDUR) 30 MG 24 hr tablet TAKE 1 TABLET(30 MG) BY MOUTH DAILY 90 tablet 0  . metoprolol tartrate (LOPRESSOR) 25 MG tablet TAKE 1 TABLET BY MOUTH TWICE DAILY 60 tablet 4  . nitroGLYCERIN (NITROSTAT) 0.4 MG SL tablet PLACE 1 TABLET UNDER THE TONGUE EVERY 5 MINUTES FOR 3 DOSES AS  NEEDED FOR CHEST PAIN 275 tablet 0  . pantoprazole (PROTONIX) 40 MG tablet TAKE 1 TABLET BY MOUTH EVERY DAY 30 tablet 1  . ranitidine (ZANTAC) 300 MG tablet TAKE 1 TABLET(300 MG) BY MOUTH AT BEDTIME 90 tablet 3  . rosuvastatin (CRESTOR) 10 MG tablet TAKE 1 TABLET BY MOUTH AT BEDTIME 30 tablet 8  . sucralfate (CARAFATE) 1 GM/10ML suspension Take 10 mLs (1 g total) by mouth 4 (four) times daily -  with meals and at bedtime. (Patient taking  differently: Take 1 g by mouth 4 (four) times daily as needed (for stomach issues.). ) 420 mL 0   No current facility-administered medications on file prior to visit.     Past Medical History:  Diagnosis Date  . AICD (automatic cardioverter/defibrillator) present   . Arthritis   . Atrial fibrillation ([place])   . Benign prostatic hypertrophy   . BPH (benign prostatic hyperplasia)   . CHF (congestive heart failure) ([place])   . Chronic systolic heart failure (HCC)    a. EF 25-30% in past; b.Echo 11/08/11: Normal LV wall thickness, EF 45-50%. c. Echo 04/17: EF 15-20%, diffuse akinesis, moderate MR, mod dilated LA, trivial PE   . Coronary artery disease    a. Chronically occ RCA. cardiac cath 10/2010: Mid LAD 20-30%, distal LAD 40%, mid D1 40%, mid circumflex 20-30%, distal RCA occluded with left to right collaterals. c. 10/2015: cath- relatively stable dz since 2012.    . Depression   . Esophageal stricture   . GERD (gastroesophageal reflux disease)   . Hemorrhoids   . History of diverticulitis of colon   . History of kidney stones   . History of nephrolithiasis   . HLD (hyperlipidemia)   . HTN (hypertension)   . Ischemic cardiomyopathy    a. s/p ICD 2008   . Myocardial infarction ([place])   . Pneumonia   . Renal insufficiency    Renal failure in July 2012 secondary to hypotension, ARB use, and   . Skin cancer    skin neck  . Stroke ([place])    tia's  . TIA (transient ischemic attack) 10/2011   carotids neg for ICA stenosis  . Urinary retention    foley indwelling 12/2010-04/2011    Past Surgical History:  Procedure Laterality Date  . CARDIAC CATHETERIZATION  11-05-10  . CARDIAC CATHETERIZATION N/A 10/14/2015   Procedure: Right/Left Heart Cath and Coronary Angiography;  Surgeon: Leonie Man, MD;  Location: [facility];  Service: Cardiovascular;  Laterality: N/A;  . CARDIAC DEFIBRILLATOR PLACEMENT  12/2006   AICD  . CATARACTS REMOVED    . EP IMPLANTABLE DEVICE N/A 11/11/2015    Procedure:  ICD Generator Changeout;  Surgeon: Evans Lance, MD;  Location: [facility];  Service: Cardiovascular;  Laterality: N/A;  . ESOPHAGOGASTRODUODENOSCOPY N/A 01/24/2014   Procedure: ESOPHAGOGASTRODUODENOSCOPY (EGD);  Surgeon: Milus Banister, MD;  Location: [facility];  Service: Endoscopy;  Laterality: N/A;  . ESOPHAGOGASTRODUODENOSCOPY N/A 02/14/2016   Procedure: ESOPHAGOGASTRODUODENOSCOPY (EGD);  Surgeon: Mauri Pole, MD;  Location: [facility];  Service: Endoscopy;  Laterality: N/A;  . JOINT REPLACEMENT    . TOTAL KNEE ARTHROPLASTY  01/04/11   L TKA  . TRANSURETHRAL RESECTION OF PROSTATE  06/14/2011   Procedure: TRANSURETHRAL RESECTION OF THE PROSTATE (TURP);  Surgeon: Bernestine Amass, MD;  Location: [facility];  Service: Urology;  Laterality: N/A;    Social History   Social History  . Marital status: Married    Spouse  name: N/A  . Number of children: N/A  . Years of education: N/A   Occupational History  . retired    Social History Main Topics  . Smoking status: Former Smoker    Packs/day: 2.00    Years: 30.00    Quit date: 06/14/1968  . Smokeless tobacco: Never Used  . Alcohol use No  . Drug use: No  . Sexual activity: Not Currently   Other Topics Concern  . Not on file   Social History Narrative   Daily caffeine     Family History  Problem Relation Age of Onset  . Heart attack Mother 22    pt unsure of age  . Liver disease Father 16  . Heart disease Son     x 2  . Colon cancer Neg Hx     Review of Systems  Constitutional: Negative for chills and fever.  Respiratory: Positive for cough (productive of phlegm). Negative for shortness of breath and wheezing.   Cardiovascular: Positive for chest pain (occasional). Negative for palpitations and leg swelling.  Gastrointestinal: Negative for abdominal pain, blood in stool, constipation, diarrhea and nausea.       Occ GERD   Genitourinary: Negative for dysuria and hematuria.  Neurological:  Negative for light-headedness and headaches.       Objective:   Vitals:   10/20/16 1607  BP: (!) 160/80  Pulse: 88  Resp: 16  Temp: 98.1 F (36.7 C)   Wt Readings from Last 3 Encounters:  10/20/16 164 lb (74.4 kg)  10/20/16 162 lb (73.5 kg)  02/23/16 156 lb (70.8 kg)   Body mass index is 24.94 kg/m.   Physical Exam    Constitutional: Appears well-developed and well-nourished. No distress.  HENT:  Head: Normocephalic and atraumatic.  Neck: Neck supple. No tracheal deviation present. No thyromegaly present.  No cervical lymphadenopathy Cardiovascular: Normal rate, regular rhythm and normal heart sounds.   No murmur heard. No carotid bruit .  Mild b/l LE edema Pulmonary/Chest: Effort normal and breath sounds normal. No respiratory distress. No has no wheezes. No rales.  Skin: Skin is warm and dry. Not diaphoretic.  Psychiatric: Normal mood and affect. Behavior is normal.      Assessment & Plan:    See Problem List for Assessment and Plan of chronic medical problems.

## 2016-10-20 NOTE — Progress Notes (Signed)
Surgical clearance form addressed by Dr. Lovena Le and placed in nurse fax bin in medical records to be faxed to [facility].

## 2016-10-20 NOTE — Patient Instructions (Signed)
We will call cardiology and get an urgent appointment your so Dr Johnsie Cancel can clear you for surgery.

## 2016-10-21 ENCOUNTER — Telehealth: Payer: Self-pay | Admitting: Internal Medicine

## 2016-10-21 ENCOUNTER — Encounter: Payer: Self-pay | Admitting: Internal Medicine

## 2016-10-21 DIAGNOSIS — Z01818 Encounter for other preprocedural examination: Secondary | ICD-10-CM | POA: Insufficient documentation

## 2016-10-21 NOTE — Telephone Encounter (Signed)
Follow up    Per Sherri calling from [place]- patient had inform PCP C/O chest pain on yesterday - patient has not been taken his B/p 160/80  Per Sherri surgery might be cancel - reaching out to see if Dr. Lovena Le has any other recommendation.     Request for surgical clearance:  1. What type of surgery is being performed? Right Total Knee   2. When is this surgery scheduled? 5.14.2018    3. Are there any medications that need to be held prior to surgery and how long? Standard   4. Name of physician performing surgery? Dr. Alvan Dame    5. What is your office phone and fax number? Fax [phone] / phone # [phone]

## 2016-10-21 NOTE — Assessment & Plan Note (Addendum)
Heart failure well compensated for despite not taking Lasix daily for 2 and 3 months He is experiencing intermittent chest pain concerning for angina and chest pain relieved by nitroglycerin He will need to see cardiology prior to surgery for clearance - we will help arrange an urgent appointment-surgery is scheduled, but may need to be delayed

## 2016-10-21 NOTE — Assessment & Plan Note (Signed)
Right total knee replacement planned-he has failed conservative measures We will need cardiac clearance prior to surgery

## 2016-10-21 NOTE — Assessment & Plan Note (Signed)
Having intermittent chest pain relieved with nitroglycerin with a history of ischemic cardiac myopathy Will not clear for surgery until he sees cardiology-we'll help arrange appointment If cardiology clears patient-okay to proceed

## 2016-10-21 NOTE — Assessment & Plan Note (Signed)
He has not been taking his Lasix and did not take his other 2 blood pressure medications today Blood pressure very elevated Stressed the importance of taking both medications daily We will need to see cardiology and active service in follow-up for his blood pressure Ideally he should monitor at home

## 2016-10-21 NOTE — Telephone Encounter (Signed)
Informed [facility] Dr. Lovena Le is out of the office until next week and that he may not be able to address this before Monday. Asked to call back if further questions/concern

## 2016-10-21 NOTE — Telephone Encounter (Signed)
New Message:  Please call asap, pt is scheduled for surgery on Monday. He reported to his primary care yesterday that he was having chest pains and blood pressure was extremely high.

## 2016-10-26 ENCOUNTER — Encounter (HOSPITAL_COMMUNITY): Admission: RE | Payer: Self-pay | Source: Ambulatory Visit

## 2016-10-26 ENCOUNTER — Inpatient Hospital Stay (HOSPITAL_COMMUNITY): Admission: RE | Admit: 2016-10-26 | Payer: Medicare Other | Source: Ambulatory Visit | Admitting: Orthopedic Surgery

## 2016-10-26 LAB — TYPE AND SCREEN
ABO/RH(D): O POS
Antibody Screen: NEGATIVE

## 2016-10-26 SURGERY — ARTHROPLASTY, KNEE, TOTAL
Anesthesia: Spinal | Site: Knee | Laterality: Right

## 2016-10-29 ENCOUNTER — Emergency Department (HOSPITAL_COMMUNITY): Payer: Medicare Other

## 2016-10-29 ENCOUNTER — Encounter (HOSPITAL_COMMUNITY): Payer: Self-pay

## 2016-10-29 ENCOUNTER — Emergency Department (HOSPITAL_COMMUNITY)
Admission: EM | Admit: 2016-10-29 | Discharge: 2016-10-29 | Disposition: A | Discharge status: Home / Self Care | Payer: Medicare Other | Attending: Emergency Medicine | Admitting: Emergency Medicine

## 2016-10-29 ENCOUNTER — Other Ambulatory Visit: Payer: Self-pay

## 2016-10-29 DIAGNOSIS — J181 Lobar pneumonia, unspecified organism: Secondary | ICD-10-CM | POA: Diagnosis not present

## 2016-10-29 DIAGNOSIS — W010XXA Fall on same level from slipping, tripping and stumbling without subsequent striking against object, initial encounter: Secondary | ICD-10-CM | POA: Diagnosis not present

## 2016-10-29 DIAGNOSIS — Z8673 Personal history of transient ischemic attack (TIA), and cerebral infarction without residual deficits: Secondary | ICD-10-CM | POA: Diagnosis not present

## 2016-10-29 DIAGNOSIS — N183 Chronic kidney disease, stage 3 (moderate): Secondary | ICD-10-CM | POA: Diagnosis not present

## 2016-10-29 DIAGNOSIS — I2510 Atherosclerotic heart disease of native coronary artery without angina pectoris: Secondary | ICD-10-CM | POA: Diagnosis not present

## 2016-10-29 DIAGNOSIS — Y999 Unspecified external cause status: Secondary | ICD-10-CM | POA: Insufficient documentation

## 2016-10-29 DIAGNOSIS — Y939 Activity, unspecified: Secondary | ICD-10-CM | POA: Diagnosis not present

## 2016-10-29 DIAGNOSIS — R531 Weakness: Secondary | ICD-10-CM | POA: Diagnosis not present

## 2016-10-29 DIAGNOSIS — Y929 Unspecified place or not applicable: Secondary | ICD-10-CM | POA: Diagnosis not present

## 2016-10-29 DIAGNOSIS — R930 Abnormal findings on diagnostic imaging of skull and head, not elsewhere classified: Secondary | ICD-10-CM | POA: Diagnosis not present

## 2016-10-29 DIAGNOSIS — I252 Old myocardial infarction: Secondary | ICD-10-CM | POA: Diagnosis not present

## 2016-10-29 DIAGNOSIS — I5022 Chronic systolic (congestive) heart failure: Secondary | ICD-10-CM | POA: Diagnosis not present

## 2016-10-29 DIAGNOSIS — S2241XA Multiple fractures of ribs, right side, initial encounter for closed fracture: Secondary | ICD-10-CM | POA: Diagnosis not present

## 2016-10-29 DIAGNOSIS — S3993XA Unspecified injury of pelvis, initial encounter: Secondary | ICD-10-CM | POA: Diagnosis not present

## 2016-10-29 DIAGNOSIS — Z79899 Other long term (current) drug therapy: Secondary | ICD-10-CM | POA: Diagnosis not present

## 2016-10-29 DIAGNOSIS — J189 Pneumonia, unspecified organism: Secondary | ICD-10-CM | POA: Diagnosis not present

## 2016-10-29 DIAGNOSIS — Z85828 Personal history of other malignant neoplasm of skin: Secondary | ICD-10-CM | POA: Diagnosis not present

## 2016-10-29 DIAGNOSIS — R0781 Pleurodynia: Secondary | ICD-10-CM | POA: Diagnosis not present

## 2016-10-29 DIAGNOSIS — I517 Cardiomegaly: Secondary | ICD-10-CM | POA: Diagnosis not present

## 2016-10-29 DIAGNOSIS — Z96652 Presence of left artificial knee joint: Secondary | ICD-10-CM | POA: Insufficient documentation

## 2016-10-29 DIAGNOSIS — I130 Hypertensive heart and chronic kidney disease with heart failure and stage 1 through stage 4 chronic kidney disease, or unspecified chronic kidney disease: Secondary | ICD-10-CM | POA: Diagnosis not present

## 2016-10-29 DIAGNOSIS — Z87891 Personal history of nicotine dependence: Secondary | ICD-10-CM | POA: Insufficient documentation

## 2016-10-29 DIAGNOSIS — S299XXA Unspecified injury of thorax, initial encounter: Secondary | ICD-10-CM | POA: Diagnosis present

## 2016-10-29 DIAGNOSIS — W19XXXA Unspecified fall, initial encounter: Secondary | ICD-10-CM

## 2016-10-29 DIAGNOSIS — S279XXA Injury of unspecified intrathoracic organ, initial encounter: Secondary | ICD-10-CM | POA: Diagnosis not present

## 2016-10-29 DIAGNOSIS — Z95810 Presence of automatic (implantable) cardiac defibrillator: Secondary | ICD-10-CM | POA: Insufficient documentation

## 2016-10-29 DIAGNOSIS — S2242XA Multiple fractures of ribs, left side, initial encounter for closed fracture: Secondary | ICD-10-CM | POA: Diagnosis not present

## 2016-10-29 DIAGNOSIS — I639 Cerebral infarction, unspecified: Secondary | ICD-10-CM | POA: Diagnosis not present

## 2016-10-29 LAB — COMPREHENSIVE METABOLIC PANEL
ALBUMIN: 3.2 g/dL — AB (ref 3.5–5.0)
ALT: 11 U/L — ABNORMAL LOW (ref 17–63)
ANION GAP: 8 (ref 5–15)
AST: 19 U/L (ref 15–41)
Alkaline Phosphatase: 69 U/L (ref 38–126)
BUN: 17 mg/dL (ref 6–20)
CALCIUM: 8.2 mg/dL — AB (ref 8.9–10.3)
CHLORIDE: 111 mmol/L (ref 101–111)
CO2: 23 mmol/L (ref 22–32)
CREATININE: 1.42 mg/dL — AB (ref 0.61–1.24)
GFR calc Af Amer: 51 mL/min — ABNORMAL LOW (ref >60)
GFR calc non Af Amer: 44 mL/min — ABNORMAL LOW (ref >60)
GLUCOSE: 157 mg/dL — AB (ref 65–99)
POTASSIUM: 3.7 mmol/L (ref 3.5–5.1)
SODIUM: 142 mmol/L (ref 135–145)
TOTAL PROTEIN: 6.4 g/dL — AB (ref 6.5–8.1)
Total Bilirubin: 1.2 mg/dL (ref 0.3–1.2)

## 2016-10-29 LAB — PROTIME-INR
INR: 1.36
Prothrombin Time: 16.8 seconds — ABNORMAL HIGH (ref 11.4–15.2)

## 2016-10-29 LAB — CBC WITH DIFFERENTIAL/PLATELET
BASOS ABS: 0 10*3/uL (ref 0.0–0.1)
BASOS PCT: 0 %
EOS ABS: 0.1 10*3/uL (ref 0.0–0.7)
EOS PCT: 1 %
HCT: 34.5 % — ABNORMAL LOW (ref 39.0–52.0)
HEMOGLOBIN: 10.9 g/dL — AB (ref 13.0–17.0)
Lymphocytes Relative: 9 %
Lymphs Abs: 0.8 10*3/uL (ref 0.7–4.0)
MCH: 27.9 pg (ref 26.0–34.0)
MCHC: 31.6 g/dL (ref 30.0–36.0)
MCV: 88.2 fL (ref 78.0–100.0)
Monocytes Absolute: 0.6 10*3/uL (ref 0.1–1.0)
Monocytes Relative: 8 %
NEUTROS PCT: 82 %
Neutro Abs: 6.6 10*3/uL (ref 1.7–7.7)
PLATELETS: 132 10*3/uL — AB (ref 150–400)
RBC: 3.91 MIL/uL — AB (ref 4.22–5.81)
RDW: 15.6 % — ABNORMAL HIGH (ref 11.5–15.5)
WBC: 8 10*3/uL (ref 4.0–10.5)

## 2016-10-29 LAB — TROPONIN I

## 2016-10-29 LAB — URINALYSIS, ROUTINE W REFLEX MICROSCOPIC
BILIRUBIN URINE: NEGATIVE
Glucose, UA: NEGATIVE mg/dL
KETONES UR: NEGATIVE mg/dL
LEUKOCYTES UA: NEGATIVE
Nitrite: NEGATIVE
PROTEIN: 100 mg/dL — AB
SPECIFIC GRAVITY, URINE: 1.026 (ref 1.005–1.030)
pH: 6 (ref 5.0–8.0)

## 2016-10-29 LAB — MAGNESIUM: MAGNESIUM: 2.1 mg/dL (ref 1.7–2.4)

## 2016-10-29 LAB — CBG MONITORING, ED: Glucose-Capillary: 149 mg/dL — ABNORMAL HIGH (ref 65–99)

## 2016-10-29 LAB — I-STAT CG4 LACTIC ACID, ED: LACTIC ACID, VENOUS: 1.4 mmol/L (ref 0.5–1.9)

## 2016-10-29 MED ORDER — HYDROCODONE-ACETAMINOPHEN 5-325 MG PO TABS: 1.0000 | ORAL_TABLET | Freq: Four times a day (QID) | ORAL | 0 refills | Status: DC | PRN

## 2016-10-29 MED ORDER — AZITHROMYCIN 250 MG PO TABS: 250.0000 mg | ORAL_TABLET | Freq: Every day | ORAL | 0 refills | Status: DC

## 2016-10-29 MED ORDER — HYDROMORPHONE HCL 1 MG/ML IJ SOLN
1.0000 mg | Freq: Once | INTRAMUSCULAR | Status: AC
  Administered 2016-10-29: 1 mg via INTRAVENOUS
  Filled 2016-10-29: qty 1

## 2016-10-29 MED ORDER — FENTANYL CITRATE (PF) 100 MCG/2ML IJ SOLN
50.0000 ug | Freq: Once | INTRAMUSCULAR | Status: AC
  Administered 2016-10-29: 50 ug via INTRAVENOUS
  Filled 2016-10-29: qty 2

## 2016-10-29 MED ORDER — DEXTROSE 5 % IV SOLN
500.0000 mg | Freq: Once | INTRAVENOUS | Status: AC
  Administered 2016-10-29: 500 mg via INTRAVENOUS
  Filled 2016-10-29: qty 500

## 2016-10-29 MED ORDER — SODIUM CHLORIDE 0.9 % IV BOLUS (SEPSIS)
1000.0000 mL | Freq: Once | INTRAVENOUS | Status: AC
  Administered 2016-10-29: 1000 mL via INTRAVENOUS

## 2016-10-29 NOTE — ED Provider Notes (Signed)
[facility] DEPT Provider Note   CSN: [id number] Arrival date & time: 10/29/16  1509     History   Chief Complaint Chief Complaint  Patient presents with  . Fall  . Altered Mental Status    HPI Jacob [name] is a 83 y.o. male.  The history is provided by the patient, medical records and the EMS personnel.  Altered Mental Status   This is a new problem. The current episode started 2 days ago. The problem has been gradually worsening. Associated symptoms include confusion and weakness. Risk factors include a recent illness. His past medical history is significant for TIA, hypertension and heart disease.     Family states he has had increased weakness and cough for a couple days. Fall today.   Past Medical History:  Diagnosis Date  . AICD (automatic cardioverter/defibrillator) present   . Arthritis   . Atrial fibrillation ([place])   . Benign prostatic hypertrophy   . BPH (benign prostatic hyperplasia)   . CHF (congestive heart failure) ([place])   . Chronic systolic heart failure (HCC)    a. EF 25-30% in past; b.Echo 11/08/11: Normal LV wall thickness, EF 45-50%. c. Echo 04/17: EF 15-20%, diffuse akinesis, moderate MR, mod dilated LA, trivial PE   . Coronary artery disease    a. Chronically occ RCA. cardiac cath 10/2010: Mid LAD 20-30%, distal LAD 40%, mid D1 40%, mid circumflex 20-30%, distal RCA occluded with left to right collaterals. c. 10/2015: cath- relatively stable dz since 2012.    . Depression   . Esophageal stricture   . GERD (gastroesophageal reflux disease)   . Hemorrhoids   . History of diverticulitis of colon   . History of kidney stones   . History of nephrolithiasis   . HLD (hyperlipidemia)   . HTN (hypertension)   . Ischemic cardiomyopathy    a. s/p ICD 2008   . Myocardial infarction ([place])   . Pneumonia   . Renal insufficiency    Renal failure in July 2012 secondary to hypotension, ARB use, and   . Skin cancer    skin neck  . Stroke ([place])    tia's  .  TIA (transient ischemic attack) 10/2011   carotids neg for ICA stenosis  . Urinary retention    foley indwelling 12/2010-04/2011    Patient Active Problem List   Diagnosis Date Noted  . Preoperative clearance 10/21/2016  . Anemia 02/23/2016  . Aspiration pneumonitis ([place]) 02/15/2016  . CKD (chronic kidney disease), stage III 02/15/2016  . Atrial fibrillation with RVR ([place])   . Cardiomyopathy, ischemic   . Diplopia 10/11/2015  . Depression 07/07/2015  . Esophageal obstruction due to food impaction 01/24/2014  . Syncope 02/22/2013  . CAD (coronary artery disease) 02/22/2013  . GERD (gastroesophageal reflux disease) 02/22/2013  . OA (osteoarthritis) of knee 02/22/2013  . TIA (transient ischemic attack) 11/07/2011  . Urinary retention 04/13/2011  . NEVUS, ATYPICAL 08/26/2010  . ECZEMA 05/27/2010  . Atrial fibrillation ([place]) 04/29/2010  . ROTATOR CUFF INJURY, RIGHT SHOULDER 09/16/2009  . Essential hypertension 04/23/2009  . BENIGN PROSTATIC HYPERTROPHY, WITH OBSTRUCTION 04/23/2009  . ARTHRITIS 04/23/2009  . HYPERCHOLESTEROLEMIA, MIXED 04/22/2008  . Automatic implantable cardioverter-defibrillator in situ 04/22/2008    Past Surgical History:  Procedure Laterality Date  . CARDIAC CATHETERIZATION  11-05-10  . CARDIAC CATHETERIZATION N/A 10/14/2015   Procedure: Right/Left Heart Cath and Coronary Angiography;  Surgeon: Leonie Man, MD;  Location: [facility];  Service: Cardiovascular;  Laterality: N/A;  .  CARDIAC DEFIBRILLATOR PLACEMENT  12/2006   AICD  . CATARACTS REMOVED    . EP IMPLANTABLE DEVICE N/A 11/11/2015   Procedure:  ICD Generator Changeout;  Surgeon: Evans Lance, MD;  Location: [facility];  Service: Cardiovascular;  Laterality: N/A;  . ESOPHAGOGASTRODUODENOSCOPY N/A 01/24/2014   Procedure: ESOPHAGOGASTRODUODENOSCOPY (EGD);  Surgeon: Milus Banister, MD;  Location: [facility];  Service: Endoscopy;  Laterality: N/A;  . ESOPHAGOGASTRODUODENOSCOPY N/A  02/14/2016   Procedure: ESOPHAGOGASTRODUODENOSCOPY (EGD);  Surgeon: Mauri Pole, MD;  Location: [facility];  Service: Endoscopy;  Laterality: N/A;  . JOINT REPLACEMENT    . TOTAL KNEE ARTHROPLASTY  01/04/11   L TKA  . TRANSURETHRAL RESECTION OF PROSTATE  06/14/2011   Procedure: TRANSURETHRAL RESECTION OF THE PROSTATE (TURP);  Surgeon: Bernestine Amass, MD;  Location: [facility];  Service: Urology;  Laterality: N/A;       Home Medications    Prior to Admission medications   Medication Sig Start Date End Date Taking? Authorizing Provider  azithromycin (ZITHROMAX) 250 MG tablet Take 1 tablet (250 mg total) by mouth daily. Take 1 every day until finished. 10/30/16   Ostin Mathey, Corene Cornea, MD  dabigatran (PRADAXA) 150 MG CAPS capsule Take 1 capsule (150 mg total) by mouth 2 (two) times daily. 07/07/15   Binnie Rail, MD  DULoxetine (CYMBALTA) 20 MG capsule Take 2 capsules (40 mg total) by mouth daily. --- Office visit needed for further refills Patient taking differently: Take 20 mg by mouth 2 (two) times daily.  10/18/16   Burns, Claudina Lick, MD  finasteride (PROSCAR) 5 MG tablet TAKE 1 TABLET(5 MG) BY MOUTH DAILY Patient taking differently: TAKE 1 TABLET(5 MG) BY MOUTH DAILY WITH LUNCH 04/01/16   Burns, Claudina Lick, MD  fluticasone (FLONASE) 50 MCG/ACT nasal spray SHAKE LIQUID AND USE 2 SPRAYS IN EACH NOSTRIL DAILY AS NEEDED FOR ALLERGIES OR RHINITIS 08/18/16   Binnie Rail, MD  fluticasone (FLONASE) 50 MCG/ACT nasal spray PLACE 2 SPRAYS INTO BOTH NOSTRIL DAILY 10/21/16   Binnie Rail, MD  furosemide (LASIX) 40 MG tablet TAKE 1 TABLET BY MOUTH DAILY Patient taking differently: TAKE 1 TABLET BY MOUTH DAILY IN THE MORNING 04/26/16   Strader, [place] M, PA-C  HYDROcodone-acetaminophen (NORCO/VICODIN) 5-325 MG tablet Take 1 tablet by mouth every 6 (six) hours as needed for severe pain. 10/29/16   Duran Ohern, Corene Cornea, MD  isosorbide mononitrate (IMDUR) 30 MG 24 hr tablet TAKE 1 TABLET(30 MG) BY MOUTH DAILY 08/17/16    Binnie Rail, MD  metoprolol tartrate (LOPRESSOR) 25 MG tablet TAKE 1 TABLET BY MOUTH TWICE DAILY 08/17/16   Ahmed Prima, [place] M, PA-C  nitroGLYCERIN (NITROSTAT) 0.4 MG SL tablet PLACE 1 TABLET UNDER THE TONGUE EVERY 5 MINUTES FOR 3 DOSES AS NEEDED FOR CHEST PAIN 02/23/16   Binnie Rail, MD  pantoprazole (PROTONIX) 40 MG tablet TAKE 1 TABLET BY MOUTH EVERY DAY 08/19/16   Binnie Rail, MD  ranitidine (ZANTAC) 300 MG tablet TAKE 1 TABLET(300 MG) BY MOUTH AT BEDTIME 04/01/16   Binnie Rail, MD  rosuvastatin (CRESTOR) 10 MG tablet TAKE 1 TABLET BY MOUTH AT BEDTIME 04/26/16   Strader, [place] M, PA-C  sucralfate (CARAFATE) 1 GM/10ML suspension Take 10 mLs (1 g total) by mouth 4 (four) times daily -  with meals and at bedtime. Patient taking differently: Take 1 g by mouth 4 (four) times daily as needed (for stomach issues.).  02/16/16   Theodis Blaze, MD  Family History Family History  Problem Relation Age of Onset  . Heart attack Mother 75       pt unsure of age  . Liver disease Father 21  . Heart disease Son        x 2  . Colon cancer Neg Hx     Social History Social History  Substance Use Topics  . Smoking status: Former Smoker    Packs/day: 2.00    Years: 30.00    Quit date: 06/14/1968  . Smokeless tobacco: Never Used  . Alcohol use No     Allergies   Bactrim [sulfamethoxazole-trimethoprim] and Penicillins   Review of Systems Review of Systems  Constitutional: Positive for fatigue.  Gastrointestinal: Positive for diarrhea.  Endocrine: Positive for polyuria.  Neurological: Positive for weakness.  Psychiatric/Behavioral: Positive for confusion.  All other systems reviewed and are negative.    Physical Exam Updated Vital Signs BP (!) 136/92   Pulse 81   Temp 98 F (36.7 C) (Oral)   Resp 14   Ht 5\' 8"  (1.727 m)   Wt 159 lb (72.1 kg)   SpO2 96%   BMI 24.18 kg/m   Physical Exam  Constitutional: He appears well-developed and well-nourished.  HENT:  Head:  Normocephalic and atraumatic.  Eyes: Conjunctivae and EOM are normal.  Neck: Normal range of motion.  Cardiovascular: Normal rate.   Pulmonary/Chest: Effort normal. No respiratory distress.  Abdominal: Soft. He exhibits no distension. There is no tenderness.  Musculoskeletal: Normal range of motion. He exhibits tenderness (left posterior pelvis and left ribs).  No cervical spine tenderness, thoracic spine tenderness or Lumbar spine tenderness.  No tenderness or pain with palpation and full ROM of all joints in upper and lower extremities.  No ecchymosis or other signs of trauma on back or extremities aside from ttp over left superior pelvis.  No Pain with AP or lateral compression of ribs.  Paracervical ttp, paraspinal ttp   Neurological: He is alert. No cranial nerve deficit.  Skin: Skin is warm and dry.  Nursing note and vitals reviewed.    ED Treatments / Results  Labs (all labs ordered are listed, but only abnormal results are displayed) Labs Reviewed  COMPREHENSIVE METABOLIC PANEL - Abnormal; Notable for the following:       Result Value   Glucose, Bld 157 (*)    Creatinine, Ser 1.42 (*)    Calcium 8.2 (*)    Total Protein 6.4 (*)    Albumin 3.2 (*)    ALT 11 (*)    GFR calc non Af Amer 44 (*)    GFR calc Af Amer 51 (*)    All other components within normal limits  PROTIME-INR - Abnormal; Notable for the following:    Prothrombin Time 16.8 (*)    All other components within normal limits  URINALYSIS, ROUTINE W REFLEX MICROSCOPIC - Abnormal; Notable for the following:    Color, Urine AMBER (*)    APPearance HAZY (*)    Hgb urine dipstick SMALL (*)    Protein, ur 100 (*)    Bacteria, UA RARE (*)    Squamous Epithelial / LPF 0-5 (*)    All other components within normal limits  CBC WITH DIFFERENTIAL/PLATELET - Abnormal; Notable for the following:    RBC 3.91 (*)    Hemoglobin 10.9 (*)    HCT 34.5 (*)    RDW 15.6 (*)    Platelets 132 (*)    All other components  within normal  limits  CBG MONITORING, ED - Abnormal; Notable for the following:    Glucose-Capillary 149 (*)    All other components within normal limits  GASTROINTESTINAL PANEL BY PCR, STOOL (REPLACES STOOL CULTURE)  MAGNESIUM  TROPONIN I  I-STAT CG4 LACTIC ACID, ED    EKG  EKG Interpretation  Date/Time:  Friday Oct 29 2016 15:14:19 EDT Ventricular Rate:  70 PR Interval:    QRS Duration: 131 QT Interval:  474 QTC Calculation: 512 R Axis:   -11 Text Interpretation:  sneSinus rhythm Multiple ventricular premature complexes LVH w/ repol abnormalities, possible ischemia Prolonged QT interval TWI in inferior and septal leads appear new since may 9 Confirmed by Merrily Pew [id number]) on 10/29/2016 3:25:17 PM       Radiology Dg Chest 2 View  Result Date: 10/29/2016 CLINICAL DATA:  Initial evaluation for acute weakness, altered mental status. EXAM: CHEST  2 VIEW COMPARISON:  Prior radiograph from 02/14/2016. FINDINGS: [place] Free left-sided transvenous pacemaker/ AICD in place. Stable cardiomegaly. Mediastinal silhouette normal. Aortic atherosclerosis. Lungs are hypoinflated. Patchy opacity within the retrocardiac left lower lobe, which may reflect atelectasis or infiltrate. Diffuse pulmonary vascular congestion without overt pulmonary edema. No pleural effusion. No pneumothorax. Irregularity at the left lateral eighth and ninth ribs, suspicious for possible rib fractures. No other acute osseus abnormality. IMPRESSION: 1. Cortical regularity at the left lateral eighth and ninth ribs, suspicious for acute minimally displaced rib fractures. Correlation with physical exam recommended. 2. Mild patchy left basilar opacity within the retrocardiac left lower lobe, which may reflect atelectasis or infiltrate. 3. Stable cardiomegaly with mild diffuse pulmonary vascular congestion without overt pulmonary edema. 4. Aortic atherosclerosis. Electronically Signed   By: Jeannine Boga M.D.   On: 10/29/2016 16:05    Dg Pelvis 1-2 Views  Result Date: 10/29/2016 CLINICAL DATA:  Initial evaluation for acute generalized weakness. Fall. EXAM: PELVIS - 1-2 VIEW COMPARISON:  None. FINDINGS: No acute fracture or dislocation. Femoral heads normally aligned. Femoral head heights preserved. Bony pelvis intact. SI joints approximated. No pubic diastasis. Degenerative osteoarthritic changes about the hips bilaterally. Prominent degenerative changes noted with the lower lumbar spine. No soft tissue abnormality. IMPRESSION: No acute osseus abnormality about the pelvis. Electronically Signed   By: Jeannine Boga M.D.   On: 10/29/2016 16:10   Ct Head Wo Contrast  Result Date: 10/29/2016 CLINICAL DATA:  Eval for bleed   Hx of TIA  Stroke CHF  CAD EXAM: CT HEAD WITHOUT CONTRAST TECHNIQUE: Contiguous axial images were obtained from the base of the skull through the vertex without intravenous contrast. COMPARISON:  10/11/2015 FINDINGS: Brain: Diffuse parenchymal atrophy. Patchy areas of hypoattenuation in deep and periventricular white matter bilaterally. Negative for acute intracranial hemorrhage, mass lesion, acute infarction, midline shift, or mass-effect. Acute infarct may be inapparent on noncontrast CT. Ventricles and sulci symmetric. Old left caudate and external capsule lacunar infarcts. Stable mineralization in bilateral basal ganglia. Vascular: Atherosclerotic and physiologic intracranial calcifications. Skull: Bone windows demonstrate no focal lesion. Sinuses/Orbits: No acute finding. Other: None. IMPRESSION: 1. Negative for bleed or other acute intracranial process. 2. Mild atrophy and nonspecific white matter changes as above. 3. Stable old lacunar infarcts in the left caudate and external capsule. Electronically Signed   By: Lucrezia Europe M.D.   On: 10/29/2016 16:44    Procedures Procedures (including critical care time)  Medications Ordered in ED Medications  sodium chloride 0.9 % bolus 1,000 mL (0 mLs  Intravenous Stopped 10/29/16 1753)  azithromycin (ZITHROMAX) 500 mg in dextrose  5 % 250 mL IVPB (0 mg Intravenous Stopped 10/29/16 2025)  fentaNYL (SUBLIMAZE) injection 50 mcg (50 mcg Intravenous Given 10/29/16 1931)  HYDROmorphone (DILAUDID) injection 1 mg (1 mg Intravenous Given 10/29/16 2255)     Initial Impression / Assessment and Plan / ED Course  I have reviewed the triage vital signs and the nursing notes.  Pertinent labs & imaging results that were available during my care of the patient were reviewed by me and considered in my medical decision making (see chart for details).     familty not here currently for full story however it seems from EMS he has been weak for a couple days, diarrhea. Today slipped on diarrhea on floor and fell. Did not hit head but has significant left sided chest pain. Rest of exam as above. Will start with weakness workup to include xrays of affected parts and head ct.   Likely pneumonia with a couple rib fractures. Pain conrtrolled. Ambulated without hypoxia or significant tachypnea/pain. Will dc with abx and pain meds. Will return for worsening symptoms.   Final Clinical Impressions(s) / ED Diagnoses   Final diagnoses:  Weakness  Community acquired pneumonia of left lung, unspecified part of lung  Fall, initial encounter  Closed fracture of multiple ribs of left side, initial encounter    New Prescriptions Discharge Medication List as of 10/29/2016 10:43 PM    START taking these medications   Details  azithromycin (ZITHROMAX) 250 MG tablet Take 1 tablet (250 mg total) by mouth daily. Take 1 every day until finished., Starting Sat 10/30/2016, Print    HYDROcodone-acetaminophen (NORCO/VICODIN) 5-325 MG tablet Take 1 tablet by mouth every 6 (six) hours as needed for severe pain., Starting Fri 10/29/2016, Print         Arvin Abello, Corene Cornea, MD 10/30/16 [phone]

## 2016-10-29 NOTE — ED Notes (Addendum)
Ambulated pt. In hallway with walker assistance, with no SOB, no dizziness and "felt better with less pain" when walking.

## 2016-10-29 NOTE — ED Notes (Signed)
EDP at bedside  

## 2016-10-29 NOTE — ED Triage Notes (Signed)
Pt. Coming from home via [facility] for altered mental status x2 days, diarrhea, and fall today. Pt. Pulling up pants and slipped on stool. Pt. Family also reports patient's balance is off. Pt. Aox3 at hospital. Pt. VSS. EDP at bedside. Pt. C/o left rib pain and lumbar pain. Pt. CBG 223, but no hx of DM.

## 2016-11-02 ENCOUNTER — Ambulatory Visit: Payer: Medicare Other | Admitting: Nurse Practitioner

## 2016-11-04 ENCOUNTER — Ambulatory Visit (INDEPENDENT_AMBULATORY_CARE_PROVIDER_SITE_OTHER): Payer: Medicare Other | Admitting: Nurse Practitioner

## 2016-11-04 ENCOUNTER — Other Ambulatory Visit: Payer: Medicare Other

## 2016-11-04 ENCOUNTER — Encounter: Payer: Self-pay | Admitting: Nurse Practitioner

## 2016-11-04 ENCOUNTER — Ambulatory Visit (INDEPENDENT_AMBULATORY_CARE_PROVIDER_SITE_OTHER)
Admission: RE | Admit: 2016-11-04 | Discharge: 2016-11-04 | Disposition: A | Discharge status: Home / Self Care | Payer: Medicare Other | Source: Ambulatory Visit | Attending: Nurse Practitioner | Admitting: Nurse Practitioner

## 2016-11-04 VITALS — BP 140/72 | HR 64 | Temp 97.9°F | Ht 68.0 in | Wt 165.0 lb

## 2016-11-04 DIAGNOSIS — R8290 Unspecified abnormal findings in urine: Secondary | ICD-10-CM | POA: Diagnosis not present

## 2016-11-04 DIAGNOSIS — R197 Diarrhea, unspecified: Secondary | ICD-10-CM | POA: Diagnosis not present

## 2016-11-04 DIAGNOSIS — R531 Weakness: Secondary | ICD-10-CM | POA: Diagnosis not present

## 2016-11-04 DIAGNOSIS — R630 Anorexia: Secondary | ICD-10-CM

## 2016-11-04 DIAGNOSIS — W19XXXA Unspecified fall, initial encounter: Secondary | ICD-10-CM | POA: Diagnosis not present

## 2016-11-04 DIAGNOSIS — J181 Lobar pneumonia, unspecified organism: Secondary | ICD-10-CM

## 2016-11-04 DIAGNOSIS — M6281 Muscle weakness (generalized): Secondary | ICD-10-CM

## 2016-11-04 DIAGNOSIS — J189 Pneumonia, unspecified organism: Secondary | ICD-10-CM

## 2016-11-04 DIAGNOSIS — S2242XA Multiple fractures of ribs, left side, initial encounter for closed fracture: Secondary | ICD-10-CM

## 2016-11-04 LAB — POCT URINALYSIS DIPSTICK
BILIRUBIN UA: NEGATIVE
GLUCOSE UA: NEGATIVE
Ketones, UA: NEGATIVE
Leukocytes, UA: NEGATIVE
Nitrite, UA: NEGATIVE
RBC UA: NEGATIVE
Urobilinogen, UA: 0.2 E.U./dL
pH, UA: 6 (ref 5.0–8.0)

## 2016-11-04 MED ORDER — ONDANSETRON HCL 4 MG PO TABS: 4.0000 mg | ORAL_TABLET | Freq: Three times a day (TID) | ORAL | 0 refills | Status: DC | PRN

## 2016-11-04 NOTE — Progress Notes (Signed)
Subjective:  Patient ID: Jacob [name], male    DOB: 08-Jun-1932  Age: 83 y.o. MRN: [id number]  CC: Hospitalization Follow-up (follow up from hospital, fell, still come pain on the side,denied coughing. )  Diarrhea   This is a new problem. The current episode started in the past 7 days. The problem occurs less than 2 times per day. The problem has been gradually improving. The stool consistency is described as watery. The patient states that diarrhea does not awaken him from sleep. Associated symptoms include bloating and coughing. Pertinent negatives include no arthralgias, chills, fever, headaches, increased  flatus, myalgias, sweats, URI, vomiting or weight loss. Associated symptoms comments: Nausea and decreased in appetite.. There are no known risk factors. He has tried nothing for the symptoms.  last BM (yesterday, still loose per patient).  At home with son and daughter.  Jacob [name] in office with son today. Complains of persistent cough, generalized weakness and nausea.  Onset 2weeks ago. Also developed diarrhea on the day he fell. (fell while trying to removed spoiled pants). He was evaluated at ED, diagnosed with pneumonia and multiple rib fracture. Discharged home with azithromycin and hydrocodone (completed treatment today).  Reviewed labs and radiology report done In ED 10/29/2016.  Outpatient Medications Prior to Visit  Medication Sig Dispense Refill  . dabigatran (PRADAXA) 150 MG CAPS capsule Take 1 capsule (150 mg total) by mouth 2 (two) times daily. 180 capsule 1  . DULoxetine (CYMBALTA) 20 MG capsule Take 2 capsules (40 mg total) by mouth daily. --- Office visit needed for further refills (Patient taking differently: Take 20 mg by mouth 2 (two) times daily. ) 180 capsule 0  . finasteride (PROSCAR) 5 MG tablet TAKE 1 TABLET(5 MG) BY MOUTH DAILY (Patient taking differently: TAKE 1 TABLET(5 MG) BY MOUTH DAILY WITH LUNCH) 90 tablet 3  . fluticasone (FLONASE) 50 MCG/ACT nasal  spray SHAKE LIQUID AND USE 2 SPRAYS IN EACH NOSTRIL DAILY AS NEEDED FOR ALLERGIES OR RHINITIS 16 g 0  . furosemide (LASIX) 40 MG tablet TAKE 1 TABLET BY MOUTH DAILY (Patient taking differently: TAKE 1 TABLET BY MOUTH DAILY IN THE MORNING) 30 tablet 8  . HYDROcodone-acetaminophen (NORCO/VICODIN) 5-325 MG tablet Take 1 tablet by mouth every 6 (six) hours as needed for severe pain. 15 tablet 0  . isosorbide mononitrate (IMDUR) 30 MG 24 hr tablet TAKE 1 TABLET(30 MG) BY MOUTH DAILY 90 tablet 0  . metoprolol tartrate (LOPRESSOR) 25 MG tablet TAKE 1 TABLET BY MOUTH TWICE DAILY 60 tablet 4  . nitroGLYCERIN (NITROSTAT) 0.4 MG SL tablet PLACE 1 TABLET UNDER THE TONGUE EVERY 5 MINUTES FOR 3 DOSES AS NEEDED FOR CHEST PAIN 275 tablet 0  . pantoprazole (PROTONIX) 40 MG tablet TAKE 1 TABLET BY MOUTH EVERY DAY 30 tablet 1  . ranitidine (ZANTAC) 300 MG tablet TAKE 1 TABLET(300 MG) BY MOUTH AT BEDTIME 90 tablet 3  . rosuvastatin (CRESTOR) 10 MG tablet TAKE 1 TABLET BY MOUTH AT BEDTIME 30 tablet 8  . sucralfate (CARAFATE) 1 GM/10ML suspension Take 10 mLs (1 g total) by mouth 4 (four) times daily -  with meals and at bedtime. (Patient taking differently: Take 1 g by mouth 4 (four) times daily as needed (for stomach issues.). ) 420 mL 0  . azithromycin (ZITHROMAX) 250 MG tablet Take 1 tablet (250 mg total) by mouth daily. Take 1 every day until finished. (Patient not taking: Reported on 11/04/2016) 4 tablet 0  . fluticasone (FLONASE) 50 MCG/ACT nasal spray  PLACE 2 SPRAYS INTO BOTH NOSTRIL DAILY (Patient not taking: Reported on 11/04/2016) 16 g 2   No facility-administered medications prior to visit.     ROS See HPI  Objective:  BP 140/72   Pulse 64   Temp 97.9 F (36.6 C)   Ht 5\' 8"  (1.727 m)   Wt 165 lb (74.8 kg)   SpO2 98%   BMI 25.09 kg/m   BP Readings from Last 3 Encounters:  11/04/16 140/72  10/29/16 (!) 136/92  10/20/16 (!) 160/80    Wt Readings from Last 3 Encounters:  11/04/16 165 lb (74.8 kg)   10/29/16 159 lb (72.1 kg)  10/20/16 162 lb (73.5 kg)    Physical Exam  Constitutional: He is oriented to person, place, and time. No distress.  Cardiovascular: Normal rate and regular rhythm.   Pulmonary/Chest: Effort normal. No respiratory distress. He has no wheezes. He has rales. He exhibits tenderness.  Abdominal: Soft. Bowel sounds are normal. He exhibits no distension and no mass. There is no tenderness. There is no rebound and no guarding.  Diffuse ABD tenderness.  Musculoskeletal: He exhibits no edema.  Ambulates with cane  Neurological: He is alert and oriented to person, place, and time.  Skin: Skin is warm and dry. No erythema.  Vitals reviewed.   Lab Results  Component Value Date   WBC 8.0 10/29/2016   HGB 10.9 (L) 10/29/2016   HCT 34.5 (L) 10/29/2016   PLT 132 (L) 10/29/2016   GLUCOSE 157 (H) 10/29/2016   CHOL 119 03/26/2013   TRIG 166.0 (H) 03/26/2013   HDL 24.70 (L) 03/26/2013   LDLDIRECT 54.3 08/26/2010   LDLCALC 61 03/26/2013   ALT 11 (L) 10/29/2016   AST 19 10/29/2016   NA 142 10/29/2016   K 3.7 10/29/2016   CL 111 10/29/2016   CREATININE 1.42 (H) 10/29/2016   BUN 17 10/29/2016   CO2 23 10/29/2016   TSH 2.522 12/03/2011   INR 1.36 10/29/2016   HGBA1C 6.1 03/26/2013    Dg Chest 2 View  Result Date: 10/29/2016 CLINICAL DATA:  Initial evaluation for acute weakness, altered mental status. EXAM: CHEST  2 VIEW COMPARISON:  Prior radiograph from 02/14/2016. FINDINGS: Almyra Free left-sided transvenous pacemaker/ AICD in place. Stable cardiomegaly. Mediastinal silhouette normal. Aortic atherosclerosis. Lungs are hypoinflated. Patchy opacity within the retrocardiac left lower lobe, which may reflect atelectasis or infiltrate. Diffuse pulmonary vascular congestion without overt pulmonary edema. No pleural effusion. No pneumothorax. Irregularity at the left lateral eighth and ninth ribs, suspicious for possible rib fractures. No other acute osseus abnormality.  IMPRESSION: 1. Cortical regularity at the left lateral eighth and ninth ribs, suspicious for acute minimally displaced rib fractures. Correlation with physical exam recommended. 2. Mild patchy left basilar opacity within the retrocardiac left lower lobe, which may reflect atelectasis or infiltrate. 3. Stable cardiomegaly with mild diffuse pulmonary vascular congestion without overt pulmonary edema. 4. Aortic atherosclerosis. Electronically Signed   By: Jeannine Boga M.D.   On: 10/29/2016 16:05   Dg Pelvis 1-2 Views  Result Date: 10/29/2016 CLINICAL DATA:  Initial evaluation for acute generalized weakness. Fall. EXAM: PELVIS - 1-2 VIEW COMPARISON:  None. FINDINGS: No acute fracture or dislocation. Femoral heads normally aligned. Femoral head heights preserved. Bony pelvis intact. SI joints approximated. No pubic diastasis. Degenerative osteoarthritic changes about the hips bilaterally. Prominent degenerative changes noted with the lower lumbar spine. No soft tissue abnormality. IMPRESSION: No acute osseus abnormality about the pelvis. Electronically Signed   By: Jeannine Boga  M.D.   On: 10/29/2016 16:10   Ct Head Wo Contrast  Result Date: 10/29/2016 CLINICAL DATA:  Eval for bleed   Hx of TIA  Stroke CHF  CAD EXAM: CT HEAD WITHOUT CONTRAST TECHNIQUE: Contiguous axial images were obtained from the base of the skull through the vertex without intravenous contrast. COMPARISON:  10/11/2015 FINDINGS: Brain: Diffuse parenchymal atrophy. Patchy areas of hypoattenuation in deep and periventricular white matter bilaterally. Negative for acute intracranial hemorrhage, mass lesion, acute infarction, midline shift, or mass-effect. Acute infarct may be inapparent on noncontrast CT. Ventricles and sulci symmetric. Old left caudate and external capsule lacunar infarcts. Stable mineralization in bilateral basal ganglia. Vascular: Atherosclerotic and physiologic intracranial calcifications. Skull: Bone windows  demonstrate no focal lesion. Sinuses/Orbits: No acute finding. Other: None. IMPRESSION: 1. Negative for bleed or other acute intracranial process. 2. Mild atrophy and nonspecific white matter changes as above. 3. Stable old lacunar infarcts in the left caudate and external capsule. Electronically Signed   By: Lucrezia Europe M.D.   On: 10/29/2016 16:44    Assessment & Plan:   Deagan was seen today for hospitalization follow-up.  Diagnoses and all orders for this visit:  Closed fracture of multiple ribs of left side, initial encounter -     DG Chest 2 View; Future  Pneumonia of left lower lobe due to infectious organism [facility]) -     DG Chest 2 View; Future  Generalized muscle weakness -     POCT urinalysis dipstick -     Urine culture; Future  Diarrhea, unspecified type  Anorexia -     POCT urinalysis dipstick -     ondansetron (ZOFRAN) 4 MG tablet; Take 1 tablet (4 mg total) by mouth every 8 (eight) hours as needed for nausea or vomiting. -     Urine culture; Future  Fall, initial encounter  Abnormal finding on urinalysis -     Urine culture; Future   I am having Jacob [name] start on ondansetron. I am also having him maintain his dabigatran, sucralfate, nitroGLYCERIN, ranitidine, finasteride, furosemide, rosuvastatin, isosorbide mononitrate, metoprolol tartrate, fluticasone, pantoprazole, DULoxetine, fluticasone, azithromycin, and HYDROcodone-acetaminophen.  Meds ordered this encounter  Medications  . ondansetron (ZOFRAN) 4 MG tablet    Sig: Take 1 tablet (4 mg total) by mouth every 8 (eight) hours as needed for nausea or vomiting.    Dispense:  20 tablet    Refill:  0    Order Specific Question:   Supervising Provider    Answer:   Cassandria Anger [[id number]]    Follow-up: Return if symptoms worsen or fail to improve.  Wilfred Lacy, NP

## 2016-11-04 NOTE — Patient Instructions (Addendum)
Please go to basement for CXR. You will be balled with results.  Encourage adequate oral hydration. Since diarrhea is improving despite recent oral abx use, will hold on CT ABD/pelvis at this time).  Advised patient to use walker for better stability while walking instead of Cane.  Food Choices to Help Relieve Diarrhea, Adult When you have diarrhea, the foods you eat and your eating habits are very important. Choosing the right foods and drinks can help:  Relieve diarrhea.  Replace lost fluids and nutrients.  Prevent dehydration. What general guidelines should I follow? Relieving diarrhea   Choose foods with less than 2 g or .07 oz. of fiber per serving.  Limit fats to less than 8 tsp (38 g or 1.34 oz.) a day.  Avoid the following:  Foods and beverages sweetened with high-fructose corn syrup, honey, or sugar alcohols such as xylitol, sorbitol, and mannitol.  Foods that contain a lot of fat or sugar.  Fried, greasy, or spicy foods.  High-fiber grains, breads, and cereals.  Raw fruits and vegetables.  Eat foods that are rich in probiotics. These foods include dairy products such as yogurt and fermented milk products. They help increase healthy bacteria in the stomach and intestines (gastrointestinal tract, or GI tract).  If you have lactose intolerance, avoid dairy products. These may make your diarrhea worse.  Take medicine to help stop diarrhea (antidiarrheal medicine) only as told by your health care provider. Replacing nutrients   Eat small meals or snacks every 3-4 hours.  Eat bland foods, such as white rice, toast, or baked potato, until your diarrhea starts to get better. Gradually reintroduce nutrient-rich foods as tolerated or as told by your health care provider. This includes:  Well-cooked protein foods.  Peeled, seeded, and soft-cooked fruits and vegetables.  Low-fat dairy products.  Take vitamin and mineral supplements as told by your health care  provider. Preventing dehydration    Start by sipping water or a special solution to prevent dehydration (oral rehydration solution, ORS). Urine that is clear or pale yellow means that you are getting enough fluid.  Try to drink at least 8-10 cups of fluid each day to help replace lost fluids.  You may add other liquids in addition to water, such as clear juice or decaffeinated sports drinks, as tolerated or as told by your health care provider.  Avoid drinks with caffeine, such as coffee, tea, or soft drinks.  Avoid alcohol. What foods are recommended? The items listed may not be a complete list. Talk with your health care provider about what dietary choices are best for you. Grains  White rice. White, [place], or pita breads (fresh or toasted), including plain rolls, buns, or bagels. White pasta. Saltine, soda, or graham crackers. Pretzels. Low-fiber cereal. Cooked cereals made with water (such as cornmeal, farina, or cream cereals). Plain muffins. Matzo. Melba toast. Zwieback. Vegetables  Potatoes (without the skin). Most well-cooked and canned vegetables without skins or seeds. Tender lettuce. Fruits  Apple sauce. Fruits canned in juice. Cooked apricots, cherries, grapefruit, peaches, pears, or plums. Fresh bananas and cantaloupe. Meats and other protein foods  Baked or boiled chicken. Eggs. Tofu. Fish. Seafood. Smooth nut butters. Ground or well-cooked tender beef, ham, veal, lamb, pork, or poultry. Dairy  Plain yogurt, kefir, and unsweetened liquid yogurt. Lactose-free milk, buttermilk, skim milk, or soy milk. Low-fat or nonfat hard cheese. Beverages  Water. Low-calorie sports drinks. Fruit juices without pulp. Strained tomato and vegetable juices. Decaffeinated teas. Sugar-free beverages not sweetened with  sugar alcohols. Oral rehydration solutions, if approved by your health care provider. Seasoning and other foods  Bouillon, broth, or soups made from recommended foods. What foods  are not recommended? The items listed may not be a complete list. Talk with your health care provider about what dietary choices are best for you. Grains  Whole grain, whole wheat, bran, or rye breads, rolls, pastas, and crackers. Wild or brown rice. Whole grain or bran cereals. Barley. Oats and oatmeal. Corn tortillas or taco shells. Granola. Popcorn. Vegetables  Raw vegetables. Fried vegetables. Cabbage, broccoli, Brussels sprouts, artichokes, baked beans, beet greens, corn, kale, legumes, peas, sweet potatoes, and yams. Potato skins. Cooked spinach and cabbage. Fruits  Dried fruit, including raisins and dates. Raw fruits. Stewed or dried prunes. Canned fruits with syrup. Meat and other protein foods  Fried or fatty meats. Deli meats. Chunky nut butters. Nuts and seeds. Beans and lentils. Berniece Salines. Hot dogs. Sausage. Dairy  High-fat cheeses. Whole milk, chocolate milk, and beverages made with milk, such as milk shakes. Half-and-half. Cream. sour cream. Ice cream. Beverages  Caffeinated beverages (such as coffee, tea, soda, or energy drinks). Alcoholic beverages. Fruit juices with pulp. Prune juice. Soft drinks sweetened with high-fructose corn syrup or sugar alcohols. High-calorie sports drinks. Fats and oils  Butter. Cream sauces. Margarine. Salad oils. Plain salad dressings. Olives. Avocados. Mayonnaise. Sweets and desserts  Sweet rolls, doughnuts, and sweet breads. Sugar-free desserts sweetened with sugar alcohols such as xylitol and sorbitol. Seasoning and other foods  Honey. Hot sauce. Chili powder. Gravy. Cream-based or milk-based soups. Pancakes and waffles. Summary  When you have diarrhea, the foods you eat and your eating habits are very important.  Make sure you get at least 8-10 cups of fluid each day, or enough to keep your urine clear or pale yellow.  Eat bland foods and gradually reintroduce healthy, nutrient-rich foods as tolerated, or as told by your health care  provider.  Avoid high-fiber, fried, greasy, or spicy foods. This information is not intended to replace advice given to you by your health care provider. Make sure you discuss any questions you have with your health care provider. Document Released: 08/21/2003 Document Revised: 05/28/2016 Document Reviewed: 05/28/2016 Elsevier Interactive Patient Education  2017 [employer].

## 2016-11-06 LAB — URINE CULTURE: ORGANISM ID, BACTERIA: NO GROWTH

## 2016-11-07 NOTE — Telephone Encounter (Signed)
I have not seen Jacob [name] for several months. He is at best moderate to high risk and may be high risk for surgery. GT

## 2016-11-16 ENCOUNTER — Telehealth: Payer: Self-pay

## 2016-11-16 ENCOUNTER — Encounter (HOSPITAL_COMMUNITY): Payer: Self-pay

## 2016-11-16 ENCOUNTER — Emergency Department (HOSPITAL_COMMUNITY): Payer: Medicare Other

## 2016-11-16 ENCOUNTER — Inpatient Hospital Stay (HOSPITAL_COMMUNITY)
Admission: EM | Admit: 2016-11-16 | Discharge: 2016-11-19 | DRG: 070 | Disposition: A | Discharge status: Home Health | Payer: Medicare Other | Attending: Internal Medicine | Admitting: Internal Medicine

## 2016-11-16 ENCOUNTER — Ambulatory Visit: Payer: Medicare Other | Admitting: Internal Medicine

## 2016-11-16 DIAGNOSIS — E860 Dehydration: Secondary | ICD-10-CM | POA: Diagnosis not present

## 2016-11-16 DIAGNOSIS — I252 Old myocardial infarction: Secondary | ICD-10-CM

## 2016-11-16 DIAGNOSIS — E785 Hyperlipidemia, unspecified: Secondary | ICD-10-CM | POA: Diagnosis present

## 2016-11-16 DIAGNOSIS — R339 Retention of urine, unspecified: Secondary | ICD-10-CM | POA: Diagnosis present

## 2016-11-16 DIAGNOSIS — Z8673 Personal history of transient ischemic attack (TIA), and cerebral infarction without residual deficits: Secondary | ICD-10-CM

## 2016-11-16 DIAGNOSIS — F29 Unspecified psychosis not due to a substance or known physiological condition: Secondary | ICD-10-CM | POA: Diagnosis present

## 2016-11-16 DIAGNOSIS — N401 Enlarged prostate with lower urinary tract symptoms: Secondary | ICD-10-CM | POA: Diagnosis present

## 2016-11-16 DIAGNOSIS — I2510 Atherosclerotic heart disease of native coronary artery without angina pectoris: Secondary | ICD-10-CM

## 2016-11-16 DIAGNOSIS — I5022 Chronic systolic (congestive) heart failure: Secondary | ICD-10-CM | POA: Diagnosis present

## 2016-11-16 DIAGNOSIS — D631 Anemia in chronic kidney disease: Secondary | ICD-10-CM | POA: Diagnosis present

## 2016-11-16 DIAGNOSIS — G9340 Encephalopathy, unspecified: Secondary | ICD-10-CM | POA: Diagnosis present

## 2016-11-16 DIAGNOSIS — E43 Unspecified severe protein-calorie malnutrition: Secondary | ICD-10-CM | POA: Diagnosis present

## 2016-11-16 DIAGNOSIS — I480 Paroxysmal atrial fibrillation: Secondary | ICD-10-CM | POA: Diagnosis present

## 2016-11-16 DIAGNOSIS — N183 Chronic kidney disease, stage 3 unspecified: Secondary | ICD-10-CM | POA: Diagnosis present

## 2016-11-16 DIAGNOSIS — Z880 Allergy status to penicillin: Secondary | ICD-10-CM

## 2016-11-16 DIAGNOSIS — Z66 Do not resuscitate: Secondary | ICD-10-CM | POA: Diagnosis present

## 2016-11-16 DIAGNOSIS — M1990 Unspecified osteoarthritis, unspecified site: Secondary | ICD-10-CM | POA: Diagnosis present

## 2016-11-16 DIAGNOSIS — E538 Deficiency of other specified B group vitamins: Secondary | ICD-10-CM

## 2016-11-16 DIAGNOSIS — R300 Dysuria: Secondary | ICD-10-CM | POA: Diagnosis not present

## 2016-11-16 DIAGNOSIS — Z7901 Long term (current) use of anticoagulants: Secondary | ICD-10-CM

## 2016-11-16 DIAGNOSIS — R410 Disorientation, unspecified: Secondary | ICD-10-CM | POA: Diagnosis not present

## 2016-11-16 DIAGNOSIS — Z96652 Presence of left artificial knee joint: Secondary | ICD-10-CM | POA: Diagnosis present

## 2016-11-16 DIAGNOSIS — G9341 Metabolic encephalopathy: Principal | ICD-10-CM | POA: Diagnosis present

## 2016-11-16 DIAGNOSIS — Z6825 Body mass index (BMI) 25.0-25.9, adult: Secondary | ICD-10-CM

## 2016-11-16 DIAGNOSIS — I10 Essential (primary) hypertension: Secondary | ICD-10-CM | POA: Diagnosis not present

## 2016-11-16 DIAGNOSIS — Z95810 Presence of automatic (implantable) cardiac defibrillator: Secondary | ICD-10-CM

## 2016-11-16 DIAGNOSIS — Z79899 Other long term (current) drug therapy: Secondary | ICD-10-CM

## 2016-11-16 DIAGNOSIS — K219 Gastro-esophageal reflux disease without esophagitis: Secondary | ICD-10-CM | POA: Diagnosis present

## 2016-11-16 DIAGNOSIS — I130 Hypertensive heart and chronic kidney disease with heart failure and stage 1 through stage 4 chronic kidney disease, or unspecified chronic kidney disease: Secondary | ICD-10-CM | POA: Diagnosis present

## 2016-11-16 DIAGNOSIS — D649 Anemia, unspecified: Secondary | ICD-10-CM | POA: Diagnosis present

## 2016-11-16 DIAGNOSIS — I255 Ischemic cardiomyopathy: Secondary | ICD-10-CM | POA: Diagnosis present

## 2016-11-16 DIAGNOSIS — N179 Acute kidney failure, unspecified: Secondary | ICD-10-CM | POA: Diagnosis present

## 2016-11-16 DIAGNOSIS — F329 Major depressive disorder, single episode, unspecified: Secondary | ICD-10-CM | POA: Diagnosis present

## 2016-11-16 DIAGNOSIS — R4182 Altered mental status, unspecified: Secondary | ICD-10-CM

## 2016-11-16 DIAGNOSIS — Z882 Allergy status to sulfonamides status: Secondary | ICD-10-CM

## 2016-11-16 DIAGNOSIS — I4891 Unspecified atrial fibrillation: Secondary | ICD-10-CM | POA: Diagnosis present

## 2016-11-16 DIAGNOSIS — D509 Iron deficiency anemia, unspecified: Secondary | ICD-10-CM | POA: Diagnosis present

## 2016-11-16 LAB — CBC
HEMATOCRIT: 38.4 % — AB (ref 39.0–52.0)
Hemoglobin: 12.4 g/dL — ABNORMAL LOW (ref 13.0–17.0)
MCH: 28.4 pg (ref 26.0–34.0)
MCHC: 32.3 g/dL (ref 30.0–36.0)
MCV: 88.1 fL (ref 78.0–100.0)
Platelets: 222 10*3/uL (ref 150–400)
RBC: 4.36 MIL/uL (ref 4.22–5.81)
RDW: 15.2 % (ref 11.5–15.5)
WBC: 7.4 10*3/uL (ref 4.0–10.5)

## 2016-11-16 LAB — VITAMIN B12: Vitamin B-12: 320 pg/mL (ref 180–914)

## 2016-11-16 LAB — COMPREHENSIVE METABOLIC PANEL
ALBUMIN: 4.2 g/dL (ref 3.5–5.0)
ALT: 8 U/L — ABNORMAL LOW (ref 17–63)
AST: 18 U/L (ref 15–41)
Alkaline Phosphatase: 110 U/L (ref 38–126)
Anion gap: 11 (ref 5–15)
BILIRUBIN TOTAL: 1 mg/dL (ref 0.3–1.2)
BUN: 21 mg/dL — AB (ref 6–20)
CHLORIDE: 102 mmol/L (ref 101–111)
CO2: 25 mmol/L (ref 22–32)
Calcium: 8.8 mg/dL — ABNORMAL LOW (ref 8.9–10.3)
Creatinine, Ser: 1.62 mg/dL — ABNORMAL HIGH (ref 0.61–1.24)
GFR calc Af Amer: 44 mL/min — ABNORMAL LOW (ref >60)
GFR calc non Af Amer: 38 mL/min — ABNORMAL LOW (ref >60)
GLUCOSE: 112 mg/dL — AB (ref 65–99)
Potassium: 3.5 mmol/L (ref 3.5–5.1)
Sodium: 138 mmol/L (ref 135–145)
Total Protein: 8.2 g/dL — ABNORMAL HIGH (ref 6.5–8.1)

## 2016-11-16 LAB — CG4 I-STAT (LACTIC ACID): Lactic Acid, Venous: 1.11 mmol/L (ref 0.5–1.9)

## 2016-11-16 LAB — URINALYSIS, ROUTINE W REFLEX MICROSCOPIC
BACTERIA UA: NONE SEEN
BILIRUBIN URINE: NEGATIVE
Glucose, UA: NEGATIVE mg/dL
HGB URINE DIPSTICK: NEGATIVE
Ketones, ur: NEGATIVE mg/dL
Nitrite: NEGATIVE
PROTEIN: 30 mg/dL — AB
Specific Gravity, Urine: 1.024 (ref 1.005–1.030)
pH: 5 (ref 5.0–8.0)

## 2016-11-16 LAB — RAPID URINE DRUG SCREEN, HOSP PERFORMED
Amphetamines: NOT DETECTED
Barbiturates: NOT DETECTED
Benzodiazepines: NOT DETECTED
COCAINE: NOT DETECTED
Opiates: NOT DETECTED
TETRAHYDROCANNABINOL: NOT DETECTED

## 2016-11-16 LAB — TSH: TSH: 2.121 u[IU]/mL (ref 0.350–4.500)

## 2016-11-16 LAB — PROTIME-INR
INR: 1.09
Prothrombin Time: 14.1 seconds (ref 11.4–15.2)

## 2016-11-16 LAB — CBG MONITORING, ED: Glucose-Capillary: 96 mg/dL (ref 65–99)

## 2016-11-16 LAB — AMMONIA

## 2016-11-16 LAB — GLUCOSE, CAPILLARY: GLUCOSE-CAPILLARY: 109 mg/dL — AB (ref 65–99)

## 2016-11-16 MED ORDER — FINASTERIDE 5 MG PO TABS
5.0000 mg | ORAL_TABLET | Freq: Every day | ORAL | Status: DC
  Administered 2016-11-17 – 2016-11-19 (×3): 5 mg via ORAL
  Filled 2016-11-16 (×3): qty 1

## 2016-11-16 MED ORDER — HYDROCODONE-ACETAMINOPHEN 5-325 MG PO TABS: 1.0000 | ORAL_TABLET | Freq: Four times a day (QID) | ORAL | Status: DC | PRN

## 2016-11-16 MED ORDER — ONDANSETRON HCL 4 MG/2ML IJ SOLN: 4.0000 mg | Freq: Four times a day (QID) | INTRAMUSCULAR | Status: DC | PRN

## 2016-11-16 MED ORDER — POLYETHYLENE GLYCOL 3350 17 G PO PACK: 17.0000 g | PACK | Freq: Every day | ORAL | Status: DC | PRN

## 2016-11-16 MED ORDER — ONDANSETRON HCL 4 MG PO TABS: 4.0000 mg | ORAL_TABLET | Freq: Four times a day (QID) | ORAL | Status: DC | PRN

## 2016-11-16 MED ORDER — ROSUVASTATIN CALCIUM 10 MG PO TABS
10.0000 mg | ORAL_TABLET | Freq: Every day | ORAL | Status: DC
  Administered 2016-11-17 – 2016-11-18 (×2): 10 mg via ORAL
  Filled 2016-11-16 (×2): qty 1

## 2016-11-16 MED ORDER — ACETAMINOPHEN 325 MG PO TABS: 650.0000 mg | ORAL_TABLET | Freq: Four times a day (QID) | ORAL | Status: DC | PRN

## 2016-11-16 MED ORDER — SUCRALFATE 1 GM/10ML PO SUSP
1.0000 g | Freq: Three times a day (TID) | ORAL | Status: DC
  Administered 2016-11-17 – 2016-11-19 (×8): 1 g via ORAL
  Filled 2016-11-16 (×8): qty 10

## 2016-11-16 MED ORDER — METOPROLOL TARTRATE 25 MG PO TABS
25.0000 mg | ORAL_TABLET | Freq: Two times a day (BID) | ORAL | Status: DC
  Administered 2016-11-16 – 2016-11-19 (×6): 25 mg via ORAL
  Filled 2016-11-16 (×6): qty 1

## 2016-11-16 MED ORDER — PANTOPRAZOLE SODIUM 40 MG PO TBEC
40.0000 mg | DELAYED_RELEASE_TABLET | Freq: Every day | ORAL | Status: DC
  Administered 2016-11-17 – 2016-11-19 (×3): 40 mg via ORAL
  Filled 2016-11-16 (×3): qty 1

## 2016-11-16 MED ORDER — SODIUM CHLORIDE 0.9 % IV SOLN
INTRAVENOUS | Status: AC
  Administered 2016-11-16 (×2): via INTRAVENOUS

## 2016-11-16 MED ORDER — BISACODYL 5 MG PO TBEC: 5.0000 mg | DELAYED_RELEASE_TABLET | Freq: Every day | ORAL | Status: DC | PRN

## 2016-11-16 MED ORDER — FAMOTIDINE 20 MG PO TABS
20.0000 mg | ORAL_TABLET | Freq: Every day | ORAL | Status: DC
  Administered 2016-11-16 – 2016-11-18 (×3): 20 mg via ORAL
  Filled 2016-11-16 (×3): qty 1

## 2016-11-16 MED ORDER — DABIGATRAN ETEXILATE MESYLATE 150 MG PO CAPS
150.0000 mg | ORAL_CAPSULE | Freq: Two times a day (BID) | ORAL | Status: DC
  Administered 2016-11-17 – 2016-11-19 (×5): 150 mg via ORAL
  Filled 2016-11-16 (×8): qty 1

## 2016-11-16 MED ORDER — SODIUM CHLORIDE 0.9 % IV BOLUS (SEPSIS)
1000.0000 mL | Freq: Once | INTRAVENOUS | Status: AC
  Administered 2016-11-16: 1000 mL via INTRAVENOUS

## 2016-11-16 MED ORDER — ACETAMINOPHEN 650 MG RE SUPP: 650.0000 mg | Freq: Four times a day (QID) | RECTAL | Status: DC | PRN

## 2016-11-16 MED ORDER — ISOSORBIDE MONONITRATE ER 30 MG PO TB24
30.0000 mg | ORAL_TABLET | Freq: Every day | ORAL | Status: DC
  Administered 2016-11-17 – 2016-11-19 (×3): 30 mg via ORAL
  Filled 2016-11-16 (×3): qty 1

## 2016-11-16 MED ORDER — DEXTROSE 5 % IV SOLN
1.0000 g | Freq: Once | INTRAVENOUS | Status: AC
  Administered 2016-11-16: 1 g via INTRAVENOUS
  Filled 2016-11-16: qty 10

## 2016-11-16 NOTE — H&P (Signed)
History and Physical    Jacob [name] NIO:[id number] DOB: Aug 04, 1931 DOA: 11/16/2016  PCP: Binnie Rail, MD   Patient coming from: Home  Chief Complaint: Hallucinations, confusion   HPI: Jacob [name] is a 83 y.o. male with medical history significant for chronic systolic CHF with defibrillator present, paroxysmal atrial fibrillation on Pradaxa, coronary artery disease, GERD, and depression, now presenting to the emergency department for evaluation of confusion and hallucinations that began yesterday. Patient is accompanied by his son who assists with the history. He had reportedly been in his usual state of health, though with poor appetite for several months, until yesterday when he was noted to be confused, and later reported seeing things that were not there. Patient was monitored closely by his daughter at home and the hallucinations and confusion continued intermittently overnight and through the day today, prompting her to call his PCP for advice. She was concerned that he had a UTI, but the PCP advised having him evaluated in the ED. Patient acknowledges that he has been confused and has been seen his wife who died last 06-28-2023. He denies any fevers or chills, denies any headache, change in vision or hearing, or focal numbness or weakness. The patient also denies chest pain, palpitations, cough, or dyspnea. He acknowledges that he has not been very hungry at all in several months, but eats a little bit to placate his family. He denies any use of alcohol or illicit substances. There has not been any head strike or loss of consciousness. Patient has not experienced similar symptoms previously.    ED Course: Upon arrival to the ED, patient is found to be afebrile, saturating well on room air, and with vital signs stable. EKG features sinus rhythm with LVH by voltage criteria and secondary repolarization abnormality. Noncontrast head CT is negative for acute intracranial abnormality and chest  x-ray is negative for acute cardiopulmonary disease. Chemistry panel reveals a serum creatinine of 1.62, slightly up from his apparent baseline of 1.4. CBC features a stable normocytic anemia with hemoglobin of 12.4, urinalysis is unremarkable, and lactate and INR are both normal. Patient was given a liter of normal saline, urine and blood cultures were obtained, and he was treated with 1 g of IV Rocephin for a possible infection. Patient remained hemodynamically stable and in no apparent distress in the ED, but continues to be intermittently confused and suffered visual hallucinations. He will be observed on medical/surgical unit for ongoing evaluation and management of this.  Review of Systems:  All other systems reviewed and apart from HPI, are negative.  Past Medical History:  Diagnosis Date  . AICD (automatic cardioverter/defibrillator) present   . Arthritis   . Atrial fibrillation ([place])   . Benign prostatic hypertrophy   . BPH (benign prostatic hyperplasia)   . CHF (congestive heart failure) ([place])   . Chronic systolic heart failure (H[facility])    a. EF 25-30% in past; b.Echo 11/08/11: Normal LV wall thickness, EF 45-50%. c. Echo 04/17: EF 15-20%, diffuse akinesis, moderate MR, mod dilated LA, trivial PE   . Coronary artery disease    a. Chronically occ RCA. cardiac cath 10/2010: Mid LAD 20-30%, distal LAD 40%, mid D1 40%, mid circumflex 20-30%, distal RCA occluded with left to right collaterals. c. 10/2015: cath- relatively stable dz since 2012.    . Depression   . Esophageal stricture   . GERD (gastroesophageal reflux disease)   . Hemorrhoids   . History of diverticulitis of colon   .  History of kidney stones   . History of nephrolithiasis   . HLD (hyperlipidemia)   . HTN (hypertension)   . Ischemic cardiomyopathy    a. s/p ICD 2008   . Myocardial infarction ([place])   . Pneumonia   . Renal insufficiency    Renal failure in July 2012 secondary to hypotension, ARB use, and   . Skin cancer     skin neck  . Stroke ([place])    tia's  . TIA (transient ischemic attack) 10/2011   carotids neg for ICA stenosis  . Urinary retention    foley indwelling 12/2010-04/2011    Past Surgical History:  Procedure Laterality Date  . CARDIAC CATHETERIZATION  11-05-10  . CARDIAC CATHETERIZATION N/A 10/14/2015   Procedure: Right/Left Heart Cath and Coronary Angiography;  Surgeon: Leonie Man, MD;  Location: [facility];  Service: Cardiovascular;  Laterality: N/A;  . CARDIAC DEFIBRILLATOR PLACEMENT  12/2006   AICD  . CATARACTS REMOVED    . EP IMPLANTABLE DEVICE N/A 11/11/2015   Procedure:  ICD Generator Changeout;  Surgeon: Evans Lance, MD;  Location: [facility];  Service: Cardiovascular;  Laterality: N/A;  . ESOPHAGOGASTRODUODENOSCOPY N/A 01/24/2014   Procedure: ESOPHAGOGASTRODUODENOSCOPY (EGD);  Surgeon: Milus Banister, MD;  Location: [facility];  Service: Endoscopy;  Laterality: N/A;  . ESOPHAGOGASTRODUODENOSCOPY N/A 02/14/2016   Procedure: ESOPHAGOGASTRODUODENOSCOPY (EGD);  Surgeon: Mauri Pole, MD;  Location: [facility];  Service: Endoscopy;  Laterality: N/A;  . JOINT REPLACEMENT    . TOTAL KNEE ARTHROPLASTY  01/04/11   L TKA  . TRANSURETHRAL RESECTION OF PROSTATE  06/14/2011   Procedure: TRANSURETHRAL RESECTION OF THE PROSTATE (TURP);  Surgeon: Bernestine Amass, MD;  Location: [facility];  Service: Urology;  Laterality: N/A;     reports that he quit smoking about 48 years ago. He has a 60.00 pack-year smoking history. He has never used smokeless tobacco. He reports that he does not drink alcohol or use drugs.  Allergies  Allergen Reactions  . Bactrim [Sulfamethoxazole-Trimethoprim] Other (See Comments)    Throat swelling  . Penicillins Itching, Swelling and Rash    Has patient had a PCN reaction causing immediate rash, facial/tongue/throat swelling, SOB or lightheadedness with hypotension:unsure Has patient had a PCN reaction causing severe rash involving mucus  membranes or skin necrosis:No Has patient had a PCN reaction that required hospitalization:No Has patient had a PCN reaction occurring within the last 10 years:No If all of the above answers are "NO", then may proceed with Cephalosporin use.     Family History  Problem Relation Age of Onset  . Heart attack Mother 93       pt unsure of age  . Liver disease Father 61  . Heart disease Son        x 2  . Colon cancer Neg Hx      Prior to Admission medications   Medication Sig Start Date End Date Taking? Authorizing Provider  dabigatran (PRADAXA) 150 MG CAPS capsule Take 1 capsule (150 mg total) by mouth 2 (two) times daily. 07/07/15  Yes Burns, Claudina Lick, MD  DULoxetine (CYMBALTA) 20 MG capsule Take 2 capsules (40 mg total) by mouth daily. --- Office visit needed for further refills Patient taking differently: Take 20 mg by mouth 2 (two) times daily.  10/18/16  Yes Burns, Claudina Lick, MD  finasteride (PROSCAR) 5 MG tablet TAKE 1 TABLET(5 MG) BY MOUTH DAILY Patient taking differently: TAKE 1 TABLET(5 MG) BY MOUTH DAILY WITH  LUNCH 04/01/16  Yes Burns, Claudina Lick, MD  fluticasone (FLONASE) 50 MCG/ACT nasal spray SHAKE LIQUID AND USE 2 SPRAYS IN EACH NOSTRIL DAILY AS NEEDED FOR ALLERGIES OR RHINITIS 08/18/16  Yes Burns, Claudina Lick, MD  furosemide (LASIX) 40 MG tablet TAKE 1 TABLET BY MOUTH DAILY Patient taking differently: TAKE 1 TABLET BY MOUTH DAILY IN THE MORNING 04/26/16  Yes Strader, [place] M, PA-C  HYDROcodone-acetaminophen (NORCO/VICODIN) 5-325 MG tablet Take 1 tablet by mouth every 6 (six) hours as needed for severe pain. 10/29/16  Yes Mesner, Corene Cornea, MD  isosorbide mononitrate (IMDUR) 30 MG 24 hr tablet TAKE 1 TABLET(30 MG) BY MOUTH DAILY 08/17/16  Yes Burns, Claudina Lick, MD  metoprolol tartrate (LOPRESSOR) 25 MG tablet TAKE 1 TABLET BY MOUTH TWICE DAILY 08/17/16  Yes Strader, [place] M, PA-C  nitroGLYCERIN (NITROSTAT) 0.4 MG SL tablet PLACE 1 TABLET UNDER THE TONGUE EVERY 5 MINUTES FOR 3 DOSES AS NEEDED FOR  CHEST PAIN 02/23/16  Yes Burns, Claudina Lick, MD  pantoprazole (PROTONIX) 40 MG tablet TAKE 1 TABLET BY MOUTH EVERY DAY 08/19/16  Yes Binnie Rail, MD  ranitidine (ZANTAC) 300 MG tablet TAKE 1 TABLET(300 MG) BY MOUTH AT BEDTIME 04/01/16  Yes Burns, Claudina Lick, MD  rosuvastatin (CRESTOR) 10 MG tablet TAKE 1 TABLET BY MOUTH AT BEDTIME 04/26/16  Yes Strader, [place] M, PA-C  sucralfate (CARAFATE) 1 GM/10ML suspension Take 10 mLs (1 g total) by mouth 4 (four) times daily -  with meals and at bedtime. Patient taking differently: Take 1 g by mouth 4 (four) times daily as needed (for stomach issues.).  02/16/16  Yes Theodis Blaze, MD    Physical Exam: Vitals:   11/16/16 1656 11/16/16 1815 11/16/16 1900 11/16/16 1907  BP: (!) 144/85 (!) 146/84 (!) 143/79   Pulse: 96 68 74   Resp: 17 16 18    Temp:    97 F (36.1 C)  TempSrc:    Rectal  SpO2: 100% 97% 99%   Weight:      Height:          Constitutional: NAD, calm, frail-appearing Eyes: PERTLA, lids and conjunctivae normal ENMT: Mucous membranes are moist. Posterior pharynx clear of any exudate or lesions.   Neck: normal, supple, no masses, no thyromegaly Respiratory: clear to auscultation bilaterally, no wheezing, no crackles. Normal respiratory effort.    Cardiovascular: S1 & S2 heard, regular rate and rhythm. No extremity edema. 2+ pedal pulses. No significant JVD. Abdomen: No distension, no tenderness, no masses palpated. Bowel sounds normal.  Musculoskeletal: no clubbing / cyanosis. No joint deformity upper and lower extremities.  Skin: no significant rashes, lesions, ulcers. Warm, dry, well-perfused. Neurologic: CN 2-12 grossly intact. Sensation intact, DTR normal. Strength 5/5 in all 4 limbs.  Psychiatric: Alert and oriented x 3. Pleasant and cooperative.     Labs on Admission: I have personally reviewed following labs and imaging studies  CBC:  Recent Labs Lab 11/16/16 1706  WBC 7.4  HGB 12.4*  HCT 38.4*  MCV 88.1  PLT 315    Basic Metabolic Panel:  Recent Labs Lab 11/16/16 1706  NA 138  K 3.5  CL 102  CO2 25  GLUCOSE 112*  BUN 21*  CREATININE 1.62*  CALCIUM 8.8*   GFR: Estimated Creatinine Clearance: 33.4 mL/min (A) (by C-G formula based on SCr of 1.62 mg/dL (H)). Liver Function Tests:  Recent Labs Lab 11/16/16 1706  AST 18  ALT 8*  ALKPHOS 110  BILITOT 1.0  PROT 8.2*  ALBUMIN 4.2   No results for input(s): LIPASE, AMYLASE in the last 168 hours. No results for input(s): AMMONIA in the last 168 hours. Coagulation Profile:  Recent Labs Lab 11/16/16 1706  INR 1.09   Cardiac Enzymes: No results for input(s): CKTOTAL, CKMB, CKMBINDEX, TROPONINI in the last 168 hours. BNP (last 3 results) No results for input(s): PROBNP in the last 8760 hours. HbA1C: No results for input(s): HGBA1C in the last 72 hours. CBG:  Recent Labs Lab 11/16/16 1620 11/16/16 1747  GLUCAP 96 109*   Lipid Profile: No results for input(s): CHOL, HDL, LDLCALC, TRIG, CHOLHDL, LDLDIRECT in the last 72 hours. Thyroid Function Tests: No results for input(s): TSH, T4TOTAL, FREET4, T3FREE, THYROIDAB in the last 72 hours. Anemia Panel: No results for input(s): VITAMINB12, FOLATE, FERRITIN, TIBC, IRON, RETICCTPCT in the last 72 hours. Urine analysis:    Component Value Date/Time   COLORURINE YELLOW 11/16/2016 1626   APPEARANCEUR CLEAR 11/16/2016 1626   LABSPEC 1.024 11/16/2016 1626   PHURINE 5.0 11/16/2016 1626   GLUCOSEU NEGATIVE 11/16/2016 1626   GLUCOSEU NEGATIVE 05/19/2011 1315   HGBUR NEGATIVE 11/16/2016 1626   BILIRUBINUR NEGATIVE 11/16/2016 1626   BILIRUBINUR neg 11/04/2016 1413   KETONESUR NEGATIVE 11/16/2016 1626   PROTEINUR 30 (A) 11/16/2016 1626   UROBILINOGEN 0.2 11/04/2016 1413   UROBILINOGEN 0.2 12/02/2011 2131   NITRITE NEGATIVE 11/16/2016 1626   LEUKOCYTESUR TRACE (A) 11/16/2016 1626   Sepsis Labs: @LABRCNTIP (procalcitonin:4,lacticidven:4) )No results found for this or any previous  visit (from the past 240 hour(s)).   Radiological Exams on Admission: Dg Chest 2 View  Result Date: 11/16/2016 CLINICAL DATA:  Confusion EXAM: CHEST  2 VIEW COMPARISON:  Nov 04, 2016 FINDINGS: There is no edema or consolidation. The heart size and pulmonary vascularity are normal. Pacemaker leads are attached to the right atrium and right ventricle. No adenopathy. There is aortic atherosclerosis. There is degenerative change in the thoracic spine. IMPRESSION: Pacemaker leads attached to right atrium and right ventricle. No edema or consolidation. There is aortic atherosclerosis. Electronically Signed   By: Lowella Grip III M.D.   On: 11/16/2016 16:52   Ct Head Wo Contrast  Result Date: 11/16/2016 CLINICAL DATA:  Confusion with hallucinations and frequent urination. EXAM: CT HEAD WITHOUT CONTRAST TECHNIQUE: Contiguous axial images were obtained from the base of the skull through the vertex without intravenous contrast. COMPARISON:  10/11/2015 and 10/29/2016 FINDINGS: Brain: The ventricles, cisterns and other CSF spaces are within normal. There is moderate chronic ischemic microvascular disease. Small left-sided lacunar infarcts over the basal ganglia/periventricular region. Bilateral basal ganglia calcifications. No evidence of mass, mass effect, shift of midline structures or acute hemorrhage. No evidence of acute infarction. Vascular: No hyperdense vessel or unexpected calcification. Skull: Normal. Negative for fracture or focal lesion. Sinuses/Orbits: No acute finding. Other: None. IMPRESSION: No acute intracranial findings. Chronic ischemic microvascular disease and old central lacunar infarcts. Electronically Signed   By: Marin Olp M.D.   On: 11/16/2016 18:06    EKG: Independently reviewed. Sinus rhythm, LVH by voltage criteria with secondary repolarization abnormality.   Assessment/Plan  1. Acute encephalopathy  - Pt presents with 2 days of intermittent confusion and visual  hallucinations  - Pt acknowledges that he has been confused and seeing his wife who expired last winter; this is a little distressing to him, but he otherwise has no complaints  - Head CT is negative for acute intracranial abnormality and there is no focal neurologic deficit  - Treated with Rocephin  in ED; UA is not suggestive of infection and there are no urinary sxs; culture is incubating, will hold further abx for now - Duloxetine unlikely to be responsible, but possible; will hold for now - ED workup is largely unremarkable - Check TSH, ammonia, B12, folate, thiamine, RPR - Continue frequent reorientation, reassurance   2. Chronic systolic CHF  - Appears dry on admission - TTE (10/11/15) with EF 15-20%, regional akinesis, moderate LAE, and moderate MR  - AICD is in place  - He is managed at home with Lasix 40 mg qD; held on admission - Follow daily wts and I/O's, resume Lasix as appropriate  3. Paroxysmal atrial fibrillation - In a sinus rhythm on admission - CHADS-VASc is 67 (age x2, CHF, HTN)  - Continue Pradaxa    4. CKD stage III  - SCr is 1.62 on admission, up from apparent baseline of 1.4  - Pt appears dry; he was given 1 liter NS in ED and is continued on a gentle IVF hydration  - Lasix is held   - Repeat chem panel in am   5. CAD - No anginal complaints  - Continue Crestor, Imdur   DVT prophylaxis: Pradaxa  Code Status: DNR Family Communication: Son updated at bedside Disposition Plan: Observe on med-surg Consults called: None Admission status: Observation    Vianne Bulls, MD [facility] Pager [phone]  If 7PM-7AM, please contact night-coverage [email or web address] Password [facility]  11/16/2016, 7:36 PM

## 2016-11-16 NOTE — Telephone Encounter (Signed)
Spoke to pt's daughter, I explained possible severity at hand and expressed concern that he should be seen at the ED due to the symptoms and speaking with Dr. Jenny Reichmann. She stated that she would tell her brother and they would get him to the hospital or either call 911.

## 2016-11-16 NOTE — ED Notes (Signed)
ED Provider at bedside. 

## 2016-11-16 NOTE — ED Provider Notes (Signed)
[facility] DEPT Provider Note   CSN: [id number] Arrival date & time: 11/16/16  1511     History   Chief Complaint Chief Complaint  Patient presents with  . Altered Mental Status  . Urinary Frequency    HPI PORTER MOES is a 83 y.o. male.  HPI   Patient presents to ED with hallucinations, confusion, malodorous urine and decreased UOP.  Pt states "I've been seeing things" and describes something about a dresser.  He cannot tell me much about how he is feeling.  States he fell last week.    Per family member's note pt has been seeing family members around the house who are deceased, was scared of a photo of his children.  Has reported malodorous urine and urinary frequency.    Level V caveat for confusion.    Past Medical History:  Diagnosis Date  . AICD (automatic cardioverter/defibrillator) present   . Arthritis   . Atrial fibrillation ([place])   . Benign prostatic hypertrophy   . BPH (benign prostatic hyperplasia)   . CHF (congestive heart failure) ([place])   . Chronic systolic heart failure (HCC)    a. EF 25-30% in past; b.Echo 11/08/11: Normal LV wall thickness, EF 45-50%. c. Echo 04/17: EF 15-20%, diffuse akinesis, moderate MR, mod dilated LA, trivial PE   . Coronary artery disease    a. Chronically occ RCA. cardiac cath 10/2010: Mid LAD 20-30%, distal LAD 40%, mid D1 40%, mid circumflex 20-30%, distal RCA occluded with left to right collaterals. c. 10/2015: cath- relatively stable dz since 2012.    . Depression   . Esophageal stricture   . GERD (gastroesophageal reflux disease)   . Hemorrhoids   . History of diverticulitis of colon   . History of kidney stones   . History of nephrolithiasis   . HLD (hyperlipidemia)   . HTN (hypertension)   . Ischemic cardiomyopathy    a. s/p ICD 2008   . Myocardial infarction ([place])   . Pneumonia   . Renal insufficiency    Renal failure in July 2012 secondary to hypotension, ARB use, and   . Skin cancer    skin neck  . Stroke  ([place])    tia's  . TIA (transient ischemic attack) 10/2011   carotids neg for ICA stenosis  . Urinary retention    foley indwelling 12/2010-04/2011    Patient Active Problem List   Diagnosis Date Noted  . Preoperative clearance 10/21/2016  . Anemia 02/23/2016  . Aspiration pneumonitis ([place]) 02/15/2016  . CKD (chronic kidney disease), stage III 02/15/2016  . Atrial fibrillation with RVR ([place])   . Cardiomyopathy, ischemic   . Diplopia 10/11/2015  . Depression 07/07/2015  . Esophageal obstruction due to food impaction 01/24/2014  . Syncope 02/22/2013  . CAD (coronary artery disease) 02/22/2013  . GERD (gastroesophageal reflux disease) 02/22/2013  . OA (osteoarthritis) of knee 02/22/2013  . TIA (transient ischemic attack) 11/07/2011  . Urinary retention 04/13/2011  . NEVUS, ATYPICAL 08/26/2010  . ECZEMA 05/27/2010  . Atrial fibrillation ([place]) 04/29/2010  . ROTATOR CUFF INJURY, RIGHT SHOULDER 09/16/2009  . Essential hypertension 04/23/2009  . BENIGN PROSTATIC HYPERTROPHY, WITH OBSTRUCTION 04/23/2009  . ARTHRITIS 04/23/2009  . HYPERCHOLESTEROLEMIA, MIXED 04/22/2008  . Automatic implantable cardioverter-defibrillator in situ 04/22/2008    Past Surgical History:  Procedure Laterality Date  . CARDIAC CATHETERIZATION  11-05-10  . CARDIAC CATHETERIZATION N/A 10/14/2015   Procedure: Right/Left Heart Cath and Coronary Angiography;  Surgeon: Leonie Man, MD;  [facility]  INVASIVE CV LAB;  Service: Cardiovascular;  Laterality: N/A;  . CARDIAC DEFIBRILLATOR PLACEMENT  12/2006   AICD  . CATARACTS REMOVED    . EP IMPLANTABLE DEVICE N/A 11/11/2015   Procedure:  ICD Generator Changeout;  Surgeon: Evans Lance, MD;  Location: [facility] CV LAB;  Service: Cardiovascular;  Laterality: N/A;  . ESOPHAGOGASTRODUODENOSCOPY N/A 01/24/2014   Procedure: ESOPHAGOGASTRODUODENOSCOPY (EGD);  Surgeon: Milus Banister, MD;  Location: [facility];  Service: Endoscopy;  Laterality: N/A;  .  ESOPHAGOGASTRODUODENOSCOPY N/A 02/14/2016   Procedure: ESOPHAGOGASTRODUODENOSCOPY (EGD);  Surgeon: Mauri Pole, MD;  Location: [facility];  Service: Endoscopy;  Laterality: N/A;  . JOINT REPLACEMENT    . TOTAL KNEE ARTHROPLASTY  01/04/11   L TKA  . TRANSURETHRAL RESECTION OF PROSTATE  06/14/2011   Procedure: TRANSURETHRAL RESECTION OF THE PROSTATE (TURP);  Surgeon: Bernestine Amass, MD;  Location: [facility];  Service: Urology;  Laterality: N/A;       Home Medications    Prior to Admission medications   Medication Sig Start Date End Date Taking? Authorizing Provider  dabigatran (PRADAXA) 150 MG CAPS capsule Take 1 capsule (150 mg total) by mouth 2 (two) times daily. 07/07/15  Yes Burns, Claudina Lick, MD  DULoxetine (CYMBALTA) 20 MG capsule Take 2 capsules (40 mg total) by mouth daily. --- Office visit needed for further refills Patient taking differently: Take 20 mg by mouth 2 (two) times daily.  10/18/16  Yes Burns, Claudina Lick, MD  finasteride (PROSCAR) 5 MG tablet TAKE 1 TABLET(5 MG) BY MOUTH DAILY Patient taking differently: TAKE 1 TABLET(5 MG) BY MOUTH DAILY WITH LUNCH 04/01/16  Yes Burns, Claudina Lick, MD  fluticasone (FLONASE) 50 MCG/ACT nasal spray SHAKE LIQUID AND USE 2 SPRAYS IN EACH NOSTRIL DAILY AS NEEDED FOR ALLERGIES OR RHINITIS 08/18/16  Yes Burns, Claudina Lick, MD  furosemide (LASIX) 40 MG tablet TAKE 1 TABLET BY MOUTH DAILY Patient taking differently: TAKE 1 TABLET BY MOUTH DAILY IN THE MORNING 04/26/16  Yes Strader, [place] M, PA-C  HYDROcodone-acetaminophen (NORCO/VICODIN) 5-325 MG tablet Take 1 tablet by mouth every 6 (six) hours as needed for severe pain. 10/29/16  Yes Mesner, Corene Cornea, MD  isosorbide mononitrate (IMDUR) 30 MG 24 hr tablet TAKE 1 TABLET(30 MG) BY MOUTH DAILY 08/17/16  Yes Burns, Claudina Lick, MD  metoprolol tartrate (LOPRESSOR) 25 MG tablet TAKE 1 TABLET BY MOUTH TWICE DAILY 08/17/16  Yes Strader, [place] M, PA-C  nitroGLYCERIN (NITROSTAT) 0.4 MG SL tablet PLACE 1 TABLET UNDER THE  TONGUE EVERY 5 MINUTES FOR 3 DOSES AS NEEDED FOR CHEST PAIN 02/23/16  Yes Burns, Claudina Lick, MD  pantoprazole (PROTONIX) 40 MG tablet TAKE 1 TABLET BY MOUTH EVERY DAY 08/19/16  Yes Binnie Rail, MD  ranitidine (ZANTAC) 300 MG tablet TAKE 1 TABLET(300 MG) BY MOUTH AT BEDTIME 04/01/16  Yes Burns, Claudina Lick, MD  rosuvastatin (CRESTOR) 10 MG tablet TAKE 1 TABLET BY MOUTH AT BEDTIME 04/26/16  Yes Strader, [place] M, PA-C  sucralfate (CARAFATE) 1 GM/10ML suspension Take 10 mLs (1 g total) by mouth 4 (four) times daily -  with meals and at bedtime. Patient taking differently: Take 1 g by mouth 4 (four) times daily as needed (for stomach issues.).  02/16/16  Yes Theodis Blaze, MD  azithromycin (ZITHROMAX) 250 MG tablet Take 1 tablet (250 mg total) by mouth daily. Take 1 every day until finished. Patient not taking: Reported on 11/04/2016 10/30/16   Mesner, Corene Cornea, MD  fluticasone [facility]) 50 MCG/ACT nasal spray  PLACE 2 SPRAYS INTO BOTH NOSTRIL DAILY Patient not taking: Reported on 11/04/2016 10/21/16   Binnie Rail, MD  ondansetron (ZOFRAN) 4 MG tablet Take 1 tablet (4 mg total) by mouth every 8 (eight) hours as needed for nausea or vomiting. Patient not taking: Reported on 11/16/2016 11/04/16   Nche, Charlene Brooke, NP    Family History Family History  Problem Relation Age of Onset  . Heart attack Mother 32       pt unsure of age  . Liver disease Father 28  . Heart disease Son        x 2  . Colon cancer Neg Hx     Social History Social History  Substance Use Topics  . Smoking status: Former Smoker    Packs/day: 2.00    Years: 30.00    Quit date: 06/14/1968  . Smokeless tobacco: Never Used  . Alcohol use No     Allergies   Bactrim [sulfamethoxazole-trimethoprim] and Penicillins   Review of Systems Review of Systems  Unable to perform ROS: Mental status change     Physical Exam Updated Vital Signs BP (!) 146/84   Pulse 68   Temp 98.1 F (36.7 C) (Oral)   Resp 16   Ht 5\' 8"  (1.727 m)    Wt 74.8 kg (165 lb)   SpO2 97%   BMI 25.09 kg/m   Physical Exam  Constitutional: He appears well-developed and well-nourished. No distress.  HENT:  Head: Normocephalic and atraumatic.  Neck: Neck supple.  Cardiovascular: Normal rate and regular rhythm.   Pulmonary/Chest: Effort normal and breath sounds normal. No respiratory distress. He has no wheezes. He has no rales.  Abdominal: Soft. He exhibits no distension and no mass. There is no tenderness. There is no rebound and no guarding.  Neurological: He is alert. He exhibits normal muscle tone.  Knows he is in [place] and it is 2018.  States he came to the Emergency Department "by horse."  He cannot tell me the month but knows we just started a new one.  He cannot tell me the [place] President.  States he (pt) was on 60 minutes last night answering questions.    Skin: He is not diaphoretic.  Nursing note and vitals reviewed.    ED Treatments / Results  Labs (all labs ordered are listed, but only abnormal results are displayed) Labs Reviewed  COMPREHENSIVE METABOLIC PANEL - Abnormal; Notable for the following:       Result Value   Glucose, Bld 112 (*)    BUN 21 (*)    Creatinine, Ser 1.62 (*)    Calcium 8.8 (*)    Total Protein 8.2 (*)    ALT 8 (*)    GFR calc non Af Amer 38 (*)    GFR calc Af Amer 44 (*)    All other components within normal limits  CBC - Abnormal; Notable for the following:    Hemoglobin 12.4 (*)    HCT 38.4 (*)    All other components within normal limits  URINALYSIS, ROUTINE W REFLEX MICROSCOPIC - Abnormal; Notable for the following:    Protein, ur 30 (*)    Leukocytes, UA TRACE (*)    Squamous Epithelial / LPF 0-5 (*)    All other components within normal limits  GLUCOSE, CAPILLARY - Abnormal; Notable for the following:    Glucose-Capillary 109 (*)    All other components within normal limits  CULTURE, BLOOD (ROUTINE X 2)  CULTURE, BLOOD (  ROUTINE X 2)  URINE CULTURE  PROTIME-INR  CBG MONITORING,  ED  I-STAT CG4 LACTIC ACID, ED  CG4 I-STAT (LACTIC ACID)  I-STAT CG4 LACTIC ACID, ED    EKG  EKG Interpretation  Date/Time:  Tuesday November 16 2016 16:10:14 EDT Ventricular Rate:  96 PR Interval:    QRS Duration: 118 QT Interval:  354 QTC Calculation: 448 R Axis:   64 Text Interpretation:  Sinus rhythm LVH with IVCD and secondary repol abnrm Baseline wander in lead(s) V6 Confirmed by Fredia Sorrow [phone]) on 11/16/2016 6:44:21 PM       Radiology Dg Chest 2 View  Result Date: 11/16/2016 CLINICAL DATA:  Confusion EXAM: CHEST  2 VIEW COMPARISON:  Nov 04, 2016 FINDINGS: There is no edema or consolidation. The heart size and pulmonary vascularity are normal. Pacemaker leads are attached to the right atrium and right ventricle. No adenopathy. There is aortic atherosclerosis. There is degenerative change in the thoracic spine. IMPRESSION: Pacemaker leads attached to right atrium and right ventricle. No edema or consolidation. There is aortic atherosclerosis. Electronically Signed   By: Lowella Grip III M.D.   On: 11/16/2016 16:52   Ct Head Wo Contrast  Result Date: 11/16/2016 CLINICAL DATA:  Confusion with hallucinations and frequent urination. EXAM: CT HEAD WITHOUT CONTRAST TECHNIQUE: Contiguous axial images were obtained from the base of the skull through the vertex without intravenous contrast. COMPARISON:  10/11/2015 and 10/29/2016 FINDINGS: Brain: The ventricles, cisterns and other CSF spaces are within normal. There is moderate chronic ischemic microvascular disease. Small left-sided lacunar infarcts over the basal ganglia/periventricular region. Bilateral basal ganglia calcifications. No evidence of mass, mass effect, shift of midline structures or acute hemorrhage. No evidence of acute infarction. Vascular: No hyperdense vessel or unexpected calcification. Skull: Normal. Negative for fracture or focal lesion. Sinuses/Orbits: No acute finding. Other: None. IMPRESSION: No acute  intracranial findings. Chronic ischemic microvascular disease and old central lacunar infarcts. Electronically Signed   By: Marin Olp M.D.   On: 11/16/2016 18:06    Procedures Procedures (including critical care time)  Medications Ordered in ED Medications  cefTRIAXone (ROCEPHIN) 1 g in dextrose 5 % 50 mL IVPB (not administered)  sodium chloride 0.9 % bolus 1,000 mL (not administered)     Initial Impression / Assessment and Plan / ED Course  I have reviewed the triage vital signs and the nursing notes.  Pertinent labs & imaging results that were available during my care of the patient were reviewed by me and considered in my medical decision making (see chart for details).     Pt with new altered mental status that began yesterday.  Reported dysuria and malodorous urine.  Does not eat and drink well at home.  Clinically appears dehydrated.  UA may be infected, will treat given reported symptoms.  Culture pending.  Admitted to [facility], Dr Myna Hidalgo accepting.    Final Clinical Impressions(s) / ED Diagnoses   Final diagnoses:  Altered mental status, unspecified altered mental status type  Dysuria  Dehydration    New Prescriptions New Prescriptions   No medications on file     Clayton Bibles, Hershal Coria 11/16/16 2256    Fredia Sorrow, MD 11/16/16 2347

## 2016-11-16 NOTE — ED Notes (Signed)
Patient transported to CT 

## 2016-11-16 NOTE — ED Triage Notes (Signed)
Patient arrived with his son, but the patient's sister wrote symptoms on a card stating that the patient is hallucinating, very confused and urinating frequent small amounts. Card also stated that the urine was thick.

## 2016-11-16 NOTE — Telephone Encounter (Signed)
-----   Message from Biagio Borg, MD sent at 11/16/2016 12:47 PM EDT ----- Pt does not have listed dx of confusion or dementia;  If having confusion and abnormal Urine he should go to ED now.  It is unlikely that simply checking his urine status will lead to an adequate diagnosis.  I note he did have Pneumonia just May 18 - ? Perhaps not improved or recurrent?    ----- Message ----- From: Juliet Rude, CMA Sent: 11/16/2016  11:49 AM To: Biagio Borg, MD  I received this msg from [facility] via Skype:  Your 4:00 pt today... his daughter called to make the appointment. She said that his urine is thick and he is seeing things. She said he thinks there is movie playing in his mirror and she had to take a picture of the wall to prove to him that there are not things moving around. She said she thought it could be a UTI but just wanted to give you a heads up!  Since you were in a room at the time and the only other provider I could find was Dr. Sharlet Salina so I asked her thought in this. She stated he could wait until appt but more than likely he would be going to the ED. I told Gareth Eagle and she called the pt and his daughter and she stated that she did not want to wait at the ED and would bring him here at 4.  Just an FYI.

## 2016-11-17 DIAGNOSIS — I480 Paroxysmal atrial fibrillation: Secondary | ICD-10-CM | POA: Diagnosis not present

## 2016-11-17 DIAGNOSIS — E860 Dehydration: Secondary | ICD-10-CM | POA: Diagnosis not present

## 2016-11-17 DIAGNOSIS — I2510 Atherosclerotic heart disease of native coronary artery without angina pectoris: Secondary | ICD-10-CM | POA: Diagnosis not present

## 2016-11-17 DIAGNOSIS — N183 Chronic kidney disease, stage 3 (moderate): Secondary | ICD-10-CM | POA: Diagnosis not present

## 2016-11-17 DIAGNOSIS — R339 Retention of urine, unspecified: Secondary | ICD-10-CM | POA: Diagnosis not present

## 2016-11-17 DIAGNOSIS — E538 Deficiency of other specified B group vitamins: Secondary | ICD-10-CM

## 2016-11-17 DIAGNOSIS — G9340 Encephalopathy, unspecified: Secondary | ICD-10-CM | POA: Diagnosis not present

## 2016-11-17 DIAGNOSIS — I5022 Chronic systolic (congestive) heart failure: Secondary | ICD-10-CM | POA: Diagnosis not present

## 2016-11-17 DIAGNOSIS — I10 Essential (primary) hypertension: Secondary | ICD-10-CM | POA: Diagnosis not present

## 2016-11-17 LAB — CBC WITH DIFFERENTIAL/PLATELET
Basophils Absolute: 0 10*3/uL (ref 0.0–0.1)
Basophils Relative: 0 %
EOS ABS: 0.1 10*3/uL (ref 0.0–0.7)
Eosinophils Relative: 2 %
HEMATOCRIT: 32.8 % — AB (ref 39.0–52.0)
HEMOGLOBIN: 10.4 g/dL — AB (ref 13.0–17.0)
LYMPHS ABS: 1 10*3/uL (ref 0.7–4.0)
Lymphocytes Relative: 17 %
MCH: 27.8 pg (ref 26.0–34.0)
MCHC: 31.7 g/dL (ref 30.0–36.0)
MCV: 87.7 fL (ref 78.0–100.0)
MONOS PCT: 9 %
Monocytes Absolute: 0.5 10*3/uL (ref 0.1–1.0)
NEUTROS PCT: 72 %
Neutro Abs: 4.3 10*3/uL (ref 1.7–7.7)
Platelets: 170 10*3/uL (ref 150–400)
RBC: 3.74 MIL/uL — ABNORMAL LOW (ref 4.22–5.81)
RDW: 15.3 % (ref 11.5–15.5)
WBC: 5.9 10*3/uL (ref 4.0–10.5)

## 2016-11-17 LAB — BASIC METABOLIC PANEL
Anion gap: 5 (ref 5–15)
BUN: 16 mg/dL (ref 6–20)
CHLORIDE: 110 mmol/L (ref 101–111)
CO2: 27 mmol/L (ref 22–32)
CREATININE: 1.28 mg/dL — AB (ref 0.61–1.24)
Calcium: 8.3 mg/dL — ABNORMAL LOW (ref 8.9–10.3)
GFR calc Af Amer: 58 mL/min — ABNORMAL LOW (ref >60)
GFR calc non Af Amer: 50 mL/min — ABNORMAL LOW (ref >60)
Glucose, Bld: 82 mg/dL (ref 65–99)
Potassium: 3.6 mmol/L (ref 3.5–5.1)
SODIUM: 142 mmol/L (ref 135–145)

## 2016-11-17 LAB — FERRITIN: FERRITIN: 36 ng/mL (ref 24–336)

## 2016-11-17 LAB — IRON AND TIBC
Iron: 43 ug/dL — ABNORMAL LOW (ref 45–182)
SATURATION RATIOS: 15 % — AB (ref 17.9–39.5)
TIBC: 284 ug/dL (ref 250–450)
UIBC: 241 ug/dL

## 2016-11-17 LAB — FOLATE: Folate: 5.5 ng/mL — ABNORMAL LOW (ref >5.9)

## 2016-11-17 LAB — HIV ANTIBODY (ROUTINE TESTING W REFLEX): HIV SCREEN 4TH GENERATION: NONREACTIVE

## 2016-11-17 LAB — VITAMIN B12: Vitamin B-12: 304 pg/mL (ref 180–914)

## 2016-11-17 LAB — RPR: RPR: NONREACTIVE

## 2016-11-17 LAB — MAGNESIUM: Magnesium: 2 mg/dL (ref 1.7–2.4)

## 2016-11-17 MED ORDER — ENSURE ENLIVE PO LIQD
237.0000 mL | Freq: Two times a day (BID) | ORAL | Status: DC
  Administered 2016-11-17 – 2016-11-19 (×4): 237 mL via ORAL

## 2016-11-17 MED ORDER — POTASSIUM CHLORIDE CRYS ER 20 MEQ PO TBCR
40.0000 meq | EXTENDED_RELEASE_TABLET | Freq: Once | ORAL | Status: AC
  Administered 2016-11-17: 40 meq via ORAL
  Filled 2016-11-17: qty 2

## 2016-11-17 MED ORDER — CYANOCOBALAMIN 1000 MCG/ML IJ SOLN
1000.0000 ug | Freq: Every day | INTRAMUSCULAR | Status: DC
  Administered 2016-11-17 – 2016-11-19 (×3): 1000 ug via INTRAMUSCULAR
  Filled 2016-11-17 (×4): qty 1

## 2016-11-17 MED ORDER — FOLIC ACID 1 MG PO TABS
1.0000 mg | ORAL_TABLET | Freq: Every day | ORAL | Status: DC
  Administered 2016-11-17 – 2016-11-19 (×3): 1 mg via ORAL
  Filled 2016-11-17 (×3): qty 1

## 2016-11-17 MED ORDER — SODIUM CHLORIDE 0.9 % IV SOLN
INTRAVENOUS | Status: DC
  Administered 2016-11-17: 15:00:00 via INTRAVENOUS

## 2016-11-17 NOTE — Progress Notes (Signed)
Initial Nutrition Assessment  DOCUMENTATION CODES:   Severe malnutrition in context of acute illness/injury  INTERVENTION:   Provide Ensure Enlive po BID, each supplement provides 350 kcal and 20 grams of protein Encourage PO intake RD to continue to monitor  NUTRITION DIAGNOSIS:   Malnutrition (Severe) related to acute illness, poor appetite (acute encephalopathy) as evidenced by percent weight loss, moderate depletion of body fat, moderate depletions of muscle mass, energy intake < or equal to 50% for > or equal to 1 month.  GOAL:   Patient will meet greater than or equal to 90% of their needs  MONITOR:   PO intake, Supplement acceptance, Labs, Weight trends, I & O's  REASON FOR ASSESSMENT:   Malnutrition Screening Tool    ASSESSMENT:   83 y.o. male with medical history significant for chronic systolic CHF with defibrillator present, paroxysmal atrial fibrillation on Pradaxa, coronary artery disease, GERD, and depression, now presenting to the emergency department for evaluation of confusion and hallucinations that began yesterday. Patient is accompanied by his son who assists with the history. He had reportedly been in his usual state of health, though with poor appetite for several months, until yesterday when he was noted to be confused, and later reported seeing things that were not there. Patient was monitored closely by his daughter at home and the hallucinations and confusion continued intermittently overnight and through the day today, prompting her to call his PCP for advice.   Patient in room with no family at bedside. Pt just finished breakfast of 1/2 pancakes and OJ. Pt states he has poor appetite which has been the case for 1 year. Pt states he will eat 1 meal a day, usually dinner which consists of a sandwich.   Per chart review, pt has lost 10 lb since 5/24 (6% wt loss x 2 weeks, significant for time frame). Nutrition-Focused physical exam completed. Findings are  moderate fat depletion and moderate muscle depletion.  Labs reviewed. Medications: Protonix tablet daily, Carafate suspension QID   Diet Order:  Diet Heart Room service appropriate? Yes; Fluid consistency: Thin; Fluid restriction: 1500 mL Fluid  Skin:  Reviewed, no issues  Last BM:  PTA  Height:   Ht Readings from Last 1 Encounters:  11/16/16 5\' 8"  (1.727 m)    Weight:   Wt Readings from Last 1 Encounters:  11/17/16 155 lb 10.3 oz (70.6 kg)    Ideal Body Weight:  70 kg  BMI:  Body mass index is 23.67 kg/m.  Estimated Nutritional Needs:   Kcal:  1700-1900  Protein:  75-85g  Fluid:  1.7L/day  EDUCATION NEEDS:   No education needs identified at this time  Clayton Bibles, MS, RD, LDN Pager: [phone] After Hours Pager: [phone]

## 2016-11-17 NOTE — Progress Notes (Signed)
Bladder scan complete per MD order with 564ml of urine scanned in the bladder. Pt was asked if need to void and stated "no, not right now." Pt tolerated well. MD aware

## 2016-11-17 NOTE — Care Management Note (Signed)
Case Management Note  Patient Details  Name: Jacob [name] MRN: [id number] Date of Birth: 13-Oct-1931  Subjective/Objective:                  a 83 y.o. male with medical history significant for chronic systolic CHF with defibrillator present, paroxysmal atrial fibrillation on Pradaxa, coronary artery disease, GERD, and depression, now presenting to the emergency department for evaluation of confusion and hallucinations that began yesterday. Patient is accompanied by his son who assists with the history. He had reportedly been in his usual state of health, though with poor appetite for several months, until yesterday when he was noted to be confused, and later reported seeing things that were not there. Patient was monitored closely by his daughter at home and the hallucinations and confusion continued intermittently overnight and through the day today, prompting her to call his PCP for advice. She was concerned that he had a UTI, but the PCP advised having him evaluated in the ED. Patient acknowledges that he has been confused and has been seen his wife who died last 2023/06/26. He denies any fevers or chills, denies any headache, change in vision or hearing, or focal numbness or weakness. The patient also denies chest pain, palpitations, cough, or dyspnea. He acknowledges that he has not been very hungry at all in several months, but eats a little bit to placate his family. He denies any use of alcohol or illicit substances. There has not been any head strike or loss of consciousness. Patient has not experienced similar symptoms previously.    Action/Plan: Date:  November 17, 2016  Chart reviewed for concurrent status and case management needs.  Will continue to follow patient progress.  Discharge Planning: following for needs  Expected discharge date: [id number]  Velva Harman, BSN, [place], [place]   Expected Discharge Date:   (unknown)               Expected Discharge Plan:  Home/Self  Care  In-House Referral:     Discharge planning Services  CM Consult  Post Acute Care Choice:    Choice offered to:     DME Arranged:    DME Agency:     HH Arranged:    [place] Agency:     Status of Service:  In process, will continue to follow  If discussed at Long Length of Stay Meetings, dates discussed:    Additional Comments:  Leeroy Cha, RN 11/17/2016, 9:07 AM

## 2016-11-17 NOTE — Progress Notes (Signed)
PROGRESS NOTE    Jacob [name]  OHY:[id number] DOB: 1932-04-12 DOA: 11/16/2016 PCP: Binnie Rail, MD    Brief Narrative:  Patient is a pleasant 83 year old gentleman history of chronic systolic heart failure status post defibrillator, paroxysmal atrial fibrillation on Protonix, coronary artery disease, GERD, depression presented to the ED for confusion and hallucinations 1 day.   Assessment & Plan:   Principal Problem:   Acute encephalopathy Active Problems:   Essential hypertension   Atrial fibrillation (HCC)   Urinary retention   CAD (coronary artery disease)   Chronic systolic CHF (congestive heart failure) (HCC)   CKD (chronic kidney disease), stage III   Anemia   Dehydration   Low vitamin B12 level   Low folate   #1 acute encephalopathy Questionable etiology. ?? Urinary retention versus psychiatric. Urinalysis negative for UTI. Blood cultures pending. Chest x-ray negative. Patient afebrile. Patient with no source of infection noted. Head CT negative. Patient with no focal neurological deficits. Patient with some clinical improvement denies any further hallucinations. Bladder scan done noted grade of 500 mL. Foley catheter placed with approximately 500-600 mL noted in Foley bag. Continue Proscar. Voiding trial in about 4-5 days. May need urology outpatient follow-up. Patient also noted to have low B-12 levels and low folate levels. Replete folic acid and vitamin B-12. Psych consultation pending.  #2 chronic systolic heart failure Patient noted to be dry on examination. 2-D echo from 10/11/2015 with a EF of 15-20%, regional akinesis, moderate LAE and moderate MR. Status post AICD. Diuretics on hold. Patient hydrated with IV fluids. Continue imdur, lopressor, crestor. Saline lock IV fluids. Monitor closely. Likely resume home dose diuretics in the next 1-2 days.  #3 paroxysmal atrial fibrillation [id number] Continue metoprolol for rate control. Pradaxa for  anticoagulation.  #4 chronic kidney disease stage III Baseline creatinine of proximally 1.4. Creatinine was 1.62 on admission. Improved with hydration currently at 1.28. Saline lock IV fluids. Stable.  #5 dehydration Gentle hydration with IV fluids. Saline lock IV fluids.  #6 urinary retention Bladder scan noted to have grade of 500 mL. Place Foley catheter. Continue Proscar. May need to follow-up with urology in the outpatient setting he feels food in caliber the next few days.  #7 coronary artery disease Stable. Continue Imdur, Crestor, metoprolol, Pradaxa.  #8 low vitamin B-12 level/low folate Placed on vitamin B-12 IM injections daily 1 week and then weekly 1 month and then monthly. Place on folic acid 1 mg daily.     DVT prophylaxis: Pradaxa Code Status: DO NOT RESUSCITATE Family Communication: Updated patient. No family at bedside. Disposition Plan: Likely home when medically stable no further visual hallucinations with clinical improvement. Pending PT evaluation.   Consultants:   Psychiatry pending  Procedures:   CT head 11/16/2016  Chest x-ray 11/16/2016  Antimicrobials:   None   Subjective: Patient laying in bed. No chest pain. No shortness of breath. No abdominal pain. Patient states he's feeling better done on admission. Patient denies any further visual hallucinations. Noted per nursing bladder scan at greater than 500 mL. Foley catheter placed.  Objective: Vitals:   11/16/16 1941 11/16/16 2015 11/17/16 0525 11/17/16 1416  BP: (!) 149/79 137/77 140/84 113/64  Pulse: 73 73 71 65  Resp: 19 20 20 20   Temp:  97.9 F (36.6 C) 98.5 F (36.9 C) 98.5 F (36.9 C)  TempSrc:  Oral Oral Oral  SpO2: 99% 99% 98% 99%  Weight:   70.6 kg (155 lb 10.3 oz)   Height:  Intake/Output Summary (Last 24 hours) at 11/17/16 1629 Last data filed at 11/17/16 1530  Gross per 24 hour  Intake              240 ml  Output              375 ml  Net             -135  ml   Filed Weights   11/16/16 1613 11/17/16 0525  Weight: 74.8 kg (165 lb) 70.6 kg (155 lb 10.3 oz)    Examination:  General exam: Appears calm and comfortable  Respiratory system: Clear to auscultation. Respiratory effort normal. Cardiovascular system: S1 & S2 heard, RRR. No JVD, murmurs, rubs, gallops or clicks. No pedal edema. Gastrointestinal system: Abdomen is nondistended, soft and nontender. No organomegaly or masses felt. Normal bowel sounds heard. Central nervous system: Alert and oriented. No focal neurological deficits. Extremities: Symmetric 5 x 5 power. Skin: No rashes, lesions or ulcers Psychiatry: Judgement and insight appear fair. Mood & affect flat.     Data Reviewed: I have personally reviewed following labs and imaging studies  CBC:  Recent Labs Lab 11/16/16 1706 11/17/16 0647  WBC 7.4 5.9  NEUTROABS  --  4.3  HGB 12.4* 10.4*  HCT 38.4* 32.8*  MCV 88.1 87.7  PLT 222 478   Basic Metabolic Panel:  Recent Labs Lab 11/16/16 1706 11/17/16 0642 11/17/16 0647  NA 138  --  142  K 3.5  --  3.6  CL 102  --  110  CO2 25  --  27  GLUCOSE 112*  --  82  BUN 21*  --  16  CREATININE 1.62*  --  1.28*  CALCIUM 8.8*  --  8.3*  MG  --  2.0  --    GFR: Estimated Creatinine Clearance: 42.3 mL/min (A) (by C-G formula based on SCr of 1.28 mg/dL (H)). Liver Function Tests:  Recent Labs Lab 11/16/16 1706  AST 18  ALT 8*  ALKPHOS 110  BILITOT 1.0  PROT 8.2*  ALBUMIN 4.2   No results for input(s): LIPASE, AMYLASE in the last 168 hours.  Recent Labs Lab 11/16/16 2024  AMMONIA <9*   Coagulation Profile:  Recent Labs Lab 11/16/16 1706  INR 1.09   Cardiac Enzymes: No results for input(s): CKTOTAL, CKMB, CKMBINDEX, TROPONINI in the last 168 hours. BNP (last 3 results) No results for input(s): PROBNP in the last 8760 hours. HbA1C: No results for input(s): HGBA1C in the last 72 hours. CBG:  Recent Labs Lab 11/16/16 1620 11/16/16 1747  GLUCAP  96 109*   Lipid Profile: No results for input(s): CHOL, HDL, LDLCALC, TRIG, CHOLHDL, LDLDIRECT in the last 72 hours. Thyroid Function Tests:  Recent Labs  11/16/16 1709  TSH 2.121   Anemia Panel:  Recent Labs  11/16/16 1709 11/17/16 0647  VITAMINB12 320 304  FOLATE  --  5.5*  FERRITIN  --  36  TIBC  --  284  IRON  --  43*   Sepsis Labs:  Recent Labs Lab 11/16/16 1709  LATICACIDVEN 1.11    Recent Results (from the past 240 hour(s))  Culture, blood (Routine x 2)     Status: None (Preliminary result)   Collection Time: 11/16/16  4:31 PM  Result Value Ref Range Status   Specimen Description LEFT ANTECUBITAL  Final   Special Requests IN PEDIATRIC BOTTLE Blood Culture adequate volume  Final   Culture   Final    NO GROWTH <  24 HOURS Performed at [facility], [place] [address], [place], [place] [address]    Report Status PENDING  Incomplete  Culture, blood (Routine x 2)     Status: None (Preliminary result)   Collection Time: 11/16/16  5:03 PM  Result Value Ref Range Status   Specimen Description RIGHT ANTECUBITAL  Final   Special Requests   Final    BOTTLES DRAWN AEROBIC AND ANAEROBIC Blood Culture adequate volume   Culture   Final    NO GROWTH < 24 HOURS Performed at [facility], [place] [address], [place], [place] [address]    Report Status PENDING  Incomplete         Radiology Studies: Dg Chest 2 View  Result Date: 11/16/2016 CLINICAL DATA:  Confusion EXAM: CHEST  2 VIEW COMPARISON:  Nov 04, 2016 FINDINGS: There is no edema or consolidation. The heart size and pulmonary vascularity are normal. Pacemaker leads are attached to the right atrium and right ventricle. No adenopathy. There is aortic atherosclerosis. There is degenerative change in the thoracic spine. IMPRESSION: Pacemaker leads attached to right atrium and right ventricle. No edema or consolidation. There is aortic atherosclerosis. Electronically Signed   By: Lowella Grip III M.D.   On:  11/16/2016 16:52   Ct Head Wo Contrast  Result Date: 11/16/2016 CLINICAL DATA:  Confusion with hallucinations and frequent urination. EXAM: CT HEAD WITHOUT CONTRAST TECHNIQUE: Contiguous axial images were obtained from the base of the skull through the vertex without intravenous contrast. COMPARISON:  10/11/2015 and 10/29/2016 FINDINGS: Brain: The ventricles, cisterns and other CSF spaces are within normal. There is moderate chronic ischemic microvascular disease. Small left-sided lacunar infarcts over the basal ganglia/periventricular region. Bilateral basal ganglia calcifications. No evidence of mass, mass effect, shift of midline structures or acute hemorrhage. No evidence of acute infarction. Vascular: No hyperdense vessel or unexpected calcification. Skull: Normal. Negative for fracture or focal lesion. Sinuses/Orbits: No acute finding. Other: None. IMPRESSION: No acute intracranial findings. Chronic ischemic microvascular disease and old central lacunar infarcts. Electronically Signed   By: Marin Olp M.D.   On: 11/16/2016 18:06        Scheduled Meds: . cyanocobalamin  1,000 mcg Intramuscular Daily  . dabigatran  150 mg Oral BID  . famotidine  20 mg Oral QHS  . feeding supplement (ENSURE ENLIVE)  237 mL Oral BID BM  . finasteride  5 mg Oral Daily  . folic acid  1 mg Oral Daily  . isosorbide mononitrate  30 mg Oral Daily  . metoprolol tartrate  25 mg Oral BID  . pantoprazole  40 mg Oral Daily  . rosuvastatin  10 mg Oral q1800  . sucralfate  1 g Oral TID WC & HS   Continuous Infusions: . sodium chloride 100 mL/hr at 11/17/16 1528     LOS: 0 days    Time spent: 57 mins    THOMPSON,DANIEL, MD [facility] Pager [phone]  If 7PM-7AM, please contact night-coverage [email or web address] Password [email or web address] 11/17/2016, 4:29 PM

## 2016-11-18 DIAGNOSIS — E538 Deficiency of other specified B group vitamins: Secondary | ICD-10-CM | POA: Diagnosis not present

## 2016-11-18 DIAGNOSIS — E860 Dehydration: Secondary | ICD-10-CM | POA: Diagnosis not present

## 2016-11-18 DIAGNOSIS — I480 Paroxysmal atrial fibrillation: Secondary | ICD-10-CM | POA: Diagnosis not present

## 2016-11-18 DIAGNOSIS — F28 Other psychotic disorder not due to a substance or known physiological condition: Secondary | ICD-10-CM | POA: Diagnosis not present

## 2016-11-18 DIAGNOSIS — Z8673 Personal history of transient ischemic attack (TIA), and cerebral infarction without residual deficits: Secondary | ICD-10-CM | POA: Diagnosis not present

## 2016-11-18 DIAGNOSIS — I5022 Chronic systolic (congestive) heart failure: Secondary | ICD-10-CM | POA: Diagnosis not present

## 2016-11-18 DIAGNOSIS — M1990 Unspecified osteoarthritis, unspecified site: Secondary | ICD-10-CM | POA: Diagnosis present

## 2016-11-18 DIAGNOSIS — N183 Chronic kidney disease, stage 3 (moderate): Secondary | ICD-10-CM | POA: Diagnosis not present

## 2016-11-18 DIAGNOSIS — Z7901 Long term (current) use of anticoagulants: Secondary | ICD-10-CM | POA: Diagnosis not present

## 2016-11-18 DIAGNOSIS — F321 Major depressive disorder, single episode, moderate: Secondary | ICD-10-CM | POA: Diagnosis not present

## 2016-11-18 DIAGNOSIS — G9340 Encephalopathy, unspecified: Secondary | ICD-10-CM

## 2016-11-18 DIAGNOSIS — F329 Major depressive disorder, single episode, unspecified: Secondary | ICD-10-CM | POA: Diagnosis present

## 2016-11-18 DIAGNOSIS — Z66 Do not resuscitate: Secondary | ICD-10-CM | POA: Diagnosis present

## 2016-11-18 DIAGNOSIS — Z87891 Personal history of nicotine dependence: Secondary | ICD-10-CM

## 2016-11-18 DIAGNOSIS — I10 Essential (primary) hypertension: Secondary | ICD-10-CM | POA: Diagnosis not present

## 2016-11-18 DIAGNOSIS — K219 Gastro-esophageal reflux disease without esophagitis: Secondary | ICD-10-CM | POA: Diagnosis present

## 2016-11-18 DIAGNOSIS — E785 Hyperlipidemia, unspecified: Secondary | ICD-10-CM | POA: Diagnosis present

## 2016-11-18 DIAGNOSIS — N179 Acute kidney failure, unspecified: Secondary | ICD-10-CM | POA: Diagnosis present

## 2016-11-18 DIAGNOSIS — G9341 Metabolic encephalopathy: Secondary | ICD-10-CM | POA: Diagnosis present

## 2016-11-18 DIAGNOSIS — D631 Anemia in chronic kidney disease: Secondary | ICD-10-CM | POA: Diagnosis present

## 2016-11-18 DIAGNOSIS — Z95810 Presence of automatic (implantable) cardiac defibrillator: Secondary | ICD-10-CM | POA: Diagnosis not present

## 2016-11-18 DIAGNOSIS — I255 Ischemic cardiomyopathy: Secondary | ICD-10-CM | POA: Diagnosis present

## 2016-11-18 DIAGNOSIS — D649 Anemia, unspecified: Secondary | ICD-10-CM | POA: Diagnosis not present

## 2016-11-18 DIAGNOSIS — I2510 Atherosclerotic heart disease of native coronary artery without angina pectoris: Secondary | ICD-10-CM | POA: Diagnosis present

## 2016-11-18 DIAGNOSIS — N401 Enlarged prostate with lower urinary tract symptoms: Secondary | ICD-10-CM | POA: Diagnosis present

## 2016-11-18 DIAGNOSIS — D509 Iron deficiency anemia, unspecified: Secondary | ICD-10-CM | POA: Diagnosis present

## 2016-11-18 DIAGNOSIS — R339 Retention of urine, unspecified: Secondary | ICD-10-CM | POA: Diagnosis not present

## 2016-11-18 DIAGNOSIS — I130 Hypertensive heart and chronic kidney disease with heart failure and stage 1 through stage 4 chronic kidney disease, or unspecified chronic kidney disease: Secondary | ICD-10-CM | POA: Diagnosis present

## 2016-11-18 DIAGNOSIS — Z96652 Presence of left artificial knee joint: Secondary | ICD-10-CM | POA: Diagnosis present

## 2016-11-18 DIAGNOSIS — E43 Unspecified severe protein-calorie malnutrition: Secondary | ICD-10-CM | POA: Diagnosis present

## 2016-11-18 DIAGNOSIS — F29 Unspecified psychosis not due to a substance or known physiological condition: Secondary | ICD-10-CM | POA: Diagnosis present

## 2016-11-18 LAB — CBC
HEMATOCRIT: 35 % — AB (ref 39.0–52.0)
HEMOGLOBIN: 11.2 g/dL — AB (ref 13.0–17.0)
MCH: 28 pg (ref 26.0–34.0)
MCHC: 32 g/dL (ref 30.0–36.0)
MCV: 87.5 fL (ref 78.0–100.0)
Platelets: 178 10*3/uL (ref 150–400)
RBC: 4 MIL/uL — AB (ref 4.22–5.81)
RDW: 15.2 % (ref 11.5–15.5)
WBC: 5.9 10*3/uL (ref 4.0–10.5)

## 2016-11-18 LAB — BASIC METABOLIC PANEL
ANION GAP: 6 (ref 5–15)
BUN: 11 mg/dL (ref 6–20)
CHLORIDE: 110 mmol/L (ref 101–111)
CO2: 26 mmol/L (ref 22–32)
Calcium: 8.5 mg/dL — ABNORMAL LOW (ref 8.9–10.3)
Creatinine, Ser: 1.2 mg/dL (ref 0.61–1.24)
GFR calc non Af Amer: 54 mL/min — ABNORMAL LOW (ref >60)
Glucose, Bld: 83 mg/dL (ref 65–99)
POTASSIUM: 3.7 mmol/L (ref 3.5–5.1)
Sodium: 142 mmol/L (ref 135–145)

## 2016-11-18 LAB — URINE CULTURE: Culture: NO GROWTH

## 2016-11-18 LAB — VITAMIN B1: VITAMIN B1 (THIAMINE): 93.8 nmol/L (ref 66.5–200.0)

## 2016-11-18 LAB — HIV ANTIBODY (ROUTINE TESTING W REFLEX): HIV Screen 4th Generation wRfx: NONREACTIVE

## 2016-11-18 LAB — RPR: RPR Ser Ql: NONREACTIVE

## 2016-11-18 MED ORDER — VITAMIN B-1 100 MG PO TABS
100.0000 mg | ORAL_TABLET | Freq: Every day | ORAL | Status: DC
  Administered 2016-11-18 – 2016-11-19 (×2): 100 mg via ORAL
  Filled 2016-11-18 (×2): qty 1

## 2016-11-18 MED ORDER — QUETIAPINE FUMARATE 25 MG PO TABS
25.0000 mg | ORAL_TABLET | Freq: Every day | ORAL | Status: DC
  Administered 2016-11-18: 25 mg via ORAL
  Filled 2016-11-18: qty 1

## 2016-11-18 MED ORDER — ADULT MULTIVITAMIN W/MINERALS CH
1.0000 | ORAL_TABLET | Freq: Every day | ORAL | Status: DC
  Administered 2016-11-18 – 2016-11-19 (×2): 1 via ORAL
  Filled 2016-11-18 (×2): qty 1

## 2016-11-18 MED ORDER — DULOXETINE HCL 30 MG PO CPEP
30.0000 mg | ORAL_CAPSULE | Freq: Every day | ORAL | Status: DC
Start: 2016-11-19 — End: 2016-11-19
  Administered 2016-11-19: 30 mg via ORAL
  Filled 2016-11-18: qty 1

## 2016-11-18 MED ORDER — DEXTROSE-NACL 5-0.45 % IV SOLN
INTRAVENOUS | Status: DC
Start: 2016-11-18 — End: 2016-11-19
  Administered 2016-11-18: 12:00:00 via INTRAVENOUS
  Administered 2016-11-19: 75 mL/h via INTRAVENOUS

## 2016-11-18 NOTE — Evaluation (Signed)
Physical Therapy Evaluation Patient Details Name: Jacob [name] MRN: [id number] DOB: 10-31-1931 Today's Date: 11/18/2016   History of Present Illness  83 yo male admitted with hallucinations/confusion.  PMH:  CAD, A Fib, TIAs, CHF, HTN  Clinical Impression  On eval, pt required Min assist for mobility. He walked ~75 feet with his straight cane. Pt was unsteady. He tolerated distance well. Recommend HHPT and 24 hour supervision/assist. Will follow and progress activity as tolerated. No family present during session    Follow Up Recommendations Home health PT;Supervision/Assistance - 24 hour    Equipment Recommendations  None recommended by PT    Recommendations for Other Services       Precautions / Restrictions Precautions Precautions: Fall Restrictions Weight Bearing Restrictions: No      Mobility  Bed Mobility Overal bed mobility: Needs Assistance Bed Mobility: Supine to Sit     Supine to sit: Supervision     General bed mobility comments: oob in recliner  Transfers Overall transfer level: Needs assistance Equipment used: Straight cane Transfers: Sit to/from Stand Sit to Stand: Min guard         General transfer comment: close guard from recliner.   Ambulation/Gait Ambulation/Gait assistance: Min assist Ambulation Distance (Feet): 75 Feet Assistive device: Straight cane Gait Pattern/deviations: Step-through pattern;Decreased stride length     General Gait Details: Assist to stabilize pt.   Stairs            Wheelchair Mobility    Modified Rankin (Stroke Patients Only)       Balance                                             Pertinent Vitals/Pain Pain Assessment: No/denies pain    Home Living Family/patient expects to be discharged to:: Private residence Living Arrangements: Children;Other relatives Available Help at Discharge: Family           Home Equipment: Kasandra Knudsen - single point;Walker - 2 wheels;Bedside  commode;Shower seat;Wheelchair - manual      Prior Function Level of Independence: Independent with assistive device(s)         Comments: pt states he did not have help for walking, ADLs     Hand Dominance        Extremity/Trunk Assessment   Upper Extremity Assessment Upper Extremity Assessment: Defer to OT evaluation    Lower Extremity Assessment Lower Extremity Assessment: Generalized weakness    Cervical / Trunk Assessment Cervical / Trunk Assessment: Kyphotic  Communication   Communication: HOH  Cognition Arousal/Alertness: Awake/alert Behavior During Therapy: WFL for tasks assessed/performed Overall Cognitive Status: Within Functional Limits for tasks assessed                                 General Comments: oriented to place and knew it was May or June.  During session, cognition WFLs      General Comments      Exercises     Assessment/Plan    PT Assessment Patient needs continued PT services  PT Problem List Decreased strength;Decreased mobility;Decreased activity tolerance;Decreased balance;Decreased knowledge of use of DME       PT Treatment Interventions DME instruction;Gait training;Therapeutic activities;Therapeutic exercise;Patient/family education;Balance training;Functional mobility training    PT Goals (Current goals can be found in the Care Plan section)  Acute Rehab PT  Goals Patient Stated Goal: none stated; agreeable to OOB PT Goal Formulation: With patient Time For Goal Achievement: 12/02/16 Potential to Achieve Goals: Good    Frequency Min 3X/week   Barriers to discharge        Co-evaluation               AM-PAC PT "6 Clicks" Daily Activity  Outcome Measure Difficulty turning over in bed (including adjusting bedclothes, sheets and blankets)?: A Little Difficulty moving from lying on back to sitting on the side of the bed? : A Little Difficulty sitting down on and standing up from a chair with arms (e.g.,  wheelchair, bedside commode, etc,.)?: A Little Help needed moving to and from a bed to chair (including a wheelchair)?: A Little Help needed walking in hospital room?: A Little Help needed climbing 3-5 steps with a railing? : A Little 6 Click Score: 18    End of Session Equipment Utilized During Treatment: Gait belt Activity Tolerance: Patient tolerated treatment well Patient left: in chair;with call bell/phone within reach;with chair alarm set   PT Visit Diagnosis: Muscle weakness (generalized) (M62.81);Difficulty in walking, not elsewhere classified (R26.2)    Time: 2637-8588 PT Time Calculation (min) (ACUTE ONLY): 8 min   Charges:   PT Evaluation $PT Eval Low Complexity: 1 Procedure     PT G Codes:          Weston Anna, MPT Pager: [phone]

## 2016-11-18 NOTE — Progress Notes (Signed)
PROGRESS NOTE    Jacob [name]  DTO:[id number] DOB: Jul 24, 1931 DOA: 11/16/2016 PCP: Binnie Rail, MD    Brief Narrative:  83 yo male presented with hallucinations and confusion. Patient know to have systolic chf, atrial fibrillation and depression. Poor appetite for several months and altered mental status 24 hours before admission. On the initial physical examination, afebrile and hemodynamically stable. Moist mucous membranes, lungs clear to auscultation, heart s1 and s2 present and rhythmic, abdomen soft and no lower extremity edema. Patient admitted with acute metabolic encephalopathy.    Assessment & Plan:   Principal Problem:   Acute encephalopathy Active Problems:   Essential hypertension   Atrial fibrillation (HCC)   Urinary retention   CAD (coronary artery disease)   Chronic systolic CHF (congestive heart failure) (HCC)   CKD (chronic kidney disease), stage III   Anemia   Dehydration   Low vitamin B12 level   Low folate   Encephalopathy   1. Metabolic encephalopathy. Patient is awake and alert, able to respond to simple questions, positive disorganized thinking, will continue hydration with dextrose and will add thiamine, folic acid and vitamins. Feedinf supplements. Continue seroquel and duloxetin.   2. Systolic chronic heart failure. Stable will continue hydration with IV fluids, continue metoprolol and isosorbide.   3. Paroxysmal atrial fibrillation,. Rate controlled, will continue metoprolol and anticoagulation with dabigatran   4. CKD stage 3. Continue hydration with IV fluids, follow on renal panel in am. Avoid hypotension,cr stable at 1.2 with K at 3,7.   5,. Urine retention. Continue finesteride, continue foley catheter for now.   6. CAD. No chest pain.    DVT prophylaxis: enoxaparin Code Status: DNR Family Communication: No family at bedside  Disposition Plan:snf   Consultants:     Procedures:    Antimicrobials:   Subjective: Patient with  no chest pain, no dyspnea, no nausea or vomiting, poor oral intake.   Objective: Vitals:   11/17/16 1700 11/17/16 2040 11/18/16 0505 11/18/16 0649  BP: (!) 140/100 137/75 (!) 143/59   Pulse:  68 69   Resp:  19 18   Temp:  97.9 F (36.6 C) 98.2 F (36.8 C)   TempSrc:  Oral Oral   SpO2:  98% 99%   Weight:    70.1 kg (154 lb 8.7 oz)  Height:        Intake/Output Summary (Last 24 hours) at 11/18/16 1124 Last data filed at 11/18/16 1000  Gross per 24 hour  Intake              360 ml  Output             1450 ml  Net            -1090 ml   Filed Weights   11/16/16 1613 11/17/16 0525 11/18/16 0649  Weight: 74.8 kg (165 lb) 70.6 kg (155 lb 10.3 oz) 70.1 kg (154 lb 8.7 oz)    Examination:  General exam: deconditioned E ENT: no pallor or icterus, oral mucosa dry.  Respiratory system: Clear to auscultation. Respiratory effort normal. No wheezing, rales or rhonchi.  Cardiovascular system: S1 & S2 heard, RRR. No JVD, murmurs, rubs, gallops or clicks. No pedal edema. Gastrointestinal system: Abdomen is nondistended, soft and nontender. No organomegaly or masses felt. Normal bowel sounds heard. Central nervous system: Alert and oriented. No focal neurological deficits. Extremities: Symmetric 5 x 5 power. Skin: No rashes, lesions or ulcers     Data Reviewed: I have personally reviewed  following labs and imaging studies  CBC:  Recent Labs Lab 11/16/16 1706 11/17/16 0647 11/18/16 0616  WBC 7.4 5.9 5.9  NEUTROABS  --  4.3  --   HGB 12.4* 10.4* 11.2*  HCT 38.4* 32.8* 35.0*  MCV 88.1 87.7 87.5  PLT 222 170 782   Basic Metabolic Panel:  Recent Labs Lab 11/16/16 1706 11/17/16 0642 11/17/16 0647 11/18/16 0616  NA 138  --  142 142  K 3.5  --  3.6 3.7  CL 102  --  110 110  CO2 25  --  27 26  GLUCOSE 112*  --  82 83  BUN 21*  --  16 11  CREATININE 1.62*  --  1.28* 1.20  CALCIUM 8.8*  --  8.3* 8.5*  MG  --  2.0  --   --    GFR: Estimated Creatinine Clearance: 45.1  mL/min (by C-G formula based on SCr of 1.2 mg/dL). Liver Function Tests:  Recent Labs Lab 11/16/16 1706  AST 18  ALT 8*  ALKPHOS 110  BILITOT 1.0  PROT 8.2*  ALBUMIN 4.2   No results for input(s): LIPASE, AMYLASE in the last 168 hours.  Recent Labs Lab 11/16/16 2024  AMMONIA <9*   Coagulation Profile:  Recent Labs Lab 11/16/16 1706  INR 1.09   Cardiac Enzymes: No results for input(s): CKTOTAL, CKMB, CKMBINDEX, TROPONINI in the last 168 hours. BNP (last 3 results) No results for input(s): PROBNP in the last 8760 hours. HbA1C: No results for input(s): HGBA1C in the last 72 hours. CBG:  Recent Labs Lab 11/16/16 1620 11/16/16 1747  GLUCAP 96 109*   Lipid Profile: No results for input(s): CHOL, HDL, LDLCALC, TRIG, CHOLHDL, LDLDIRECT in the last 72 hours. Thyroid Function Tests:  Recent Labs  11/16/16 1709  TSH 2.121   Anemia Panel:  Recent Labs  11/16/16 1709 11/17/16 0647  VITAMINB12 320 304  FOLATE  --  5.5*  FERRITIN  --  36  TIBC  --  284  IRON  --  43*   Sepsis Labs:  Recent Labs Lab 11/16/16 1709  LATICACIDVEN 1.11    Recent Results (from the past 240 hour(s))  Urine culture     Status: None   Collection Time: 11/16/16  4:26 PM  Result Value Ref Range Status   Specimen Description URINE, CLEAN CATCH  Final   Special Requests NONE  Final   Culture   Final    NO GROWTH Performed at [facility], [place] [address], [place], [place] [address]    Report Status 11/18/2016 FINAL  Final  Culture, blood (Routine x 2)     Status: None (Preliminary result)   Collection Time: 11/16/16  4:31 PM  Result Value Ref Range Status   Specimen Description LEFT ANTECUBITAL  Final   Special Requests IN PEDIATRIC BOTTLE Blood Culture adequate volume  Final   Culture   Final    NO GROWTH < 24 HOURS Performed at [facility], [place] [address] [place], [place] [address]    Report Status PENDING  Incomplete  Culture, blood (Routine x 2)      Status: None (Preliminary result)   Collection Time: 11/16/16  5:03 PM  Result Value Ref Range Status   Specimen Description RIGHT ANTECUBITAL  Final   Special Requests   Final    BOTTLES DRAWN AEROBIC AND ANAEROBIC Blood Culture adequate volume   Culture   Final    NO GROWTH < 24 HOURS Performed  at [facility], [place] [address], [place], [place] [address]    Report Status PENDING  Incomplete         Radiology Studies: Dg Chest 2 View  Result Date: 11/16/2016 CLINICAL DATA:  Confusion EXAM: CHEST  2 VIEW COMPARISON:  Nov 04, 2016 FINDINGS: There is no edema or consolidation. The heart size and pulmonary vascularity are normal. Pacemaker leads are attached to the right atrium and right ventricle. No adenopathy. There is aortic atherosclerosis. There is degenerative change in the thoracic spine. IMPRESSION: Pacemaker leads attached to right atrium and right ventricle. No edema or consolidation. There is aortic atherosclerosis. Electronically Signed   By: Lowella Grip III M.D.   On: 11/16/2016 16:52   Ct Head Wo Contrast  Result Date: 11/16/2016 CLINICAL DATA:  Confusion with hallucinations and frequent urination. EXAM: CT HEAD WITHOUT CONTRAST TECHNIQUE: Contiguous axial images were obtained from the base of the skull through the vertex without intravenous contrast. COMPARISON:  10/11/2015 and 10/29/2016 FINDINGS: Brain: The ventricles, cisterns and other CSF spaces are within normal. There is moderate chronic ischemic microvascular disease. Small left-sided lacunar infarcts over the basal ganglia/periventricular region. Bilateral basal ganglia calcifications. No evidence of mass, mass effect, shift of midline structures or acute hemorrhage. No evidence of acute infarction. Vascular: No hyperdense vessel or unexpected calcification. Skull: Normal. Negative for fracture or focal lesion. Sinuses/Orbits: No acute finding. Other: None. IMPRESSION: No acute intracranial findings. Chronic  ischemic microvascular disease and old central lacunar infarcts. Electronically Signed   By: Marin Olp M.D.   On: 11/16/2016 18:06        Scheduled Meds: . cyanocobalamin  1,000 mcg Intramuscular Daily  . dabigatran  150 mg Oral BID  . famotidine  20 mg Oral QHS  . feeding supplement (ENSURE ENLIVE)  237 mL Oral BID BM  . finasteride  5 mg Oral Daily  . folic acid  1 mg Oral Daily  . isosorbide mononitrate  30 mg Oral Daily  . metoprolol tartrate  25 mg Oral BID  . multivitamin with minerals  1 tablet Oral Daily  . pantoprazole  40 mg Oral Daily  . rosuvastatin  10 mg Oral q1800  . sucralfate  1 g Oral TID WC & HS  . thiamine  100 mg Oral Daily   Continuous Infusions:   LOS: 0 days      Ramonica Grigg Gerome Apley, MD [facility] Pager [phone]  If 7PM-7AM, please contact night-coverage [email or web address] Password [email or web address] 11/18/2016, 11:24 AM

## 2016-11-18 NOTE — Consult Note (Signed)
[facility] Consult   Reason for Consult:  Hallucination Referring Physician:  Dr.Arrien Patient Identification: Jacob [name] MRN:  [id number] Principal Diagnosis: Acute encephalopathy Diagnosis:   Patient Active Problem List   Diagnosis Date Noted  . Dehydration [E86.0]   . Low vitamin B12 level [E53.8]   . Low folate [E53.8]   . Acute encephalopathy [G93.40] 11/16/2016  . Preoperative clearance [Z01.818] 10/21/2016  . Anemia [D64.9] 02/23/2016  . Aspiration pneumonitis ([place]) [J69.0] 02/15/2016  . CKD (chronic kidney disease), stage III [N18.3] 02/15/2016  . Atrial fibrillation with RVR ([place]) [I48.91]   . Cardiomyopathy, ischemic [I25.5]   . Chronic systolic CHF (congestive heart failure) ([place]) [I50.22] 10/11/2015  . Diplopia [H53.2] 10/11/2015  . Depression [F32.9] 07/07/2015  . Esophageal obstruction due to food impaction [K22.2, T18.128A] 01/24/2014  . Syncope [R55] 02/22/2013  . CAD (coronary artery disease) [I25.10] 02/22/2013  . GERD (gastroesophageal reflux disease) [K21.9] 02/22/2013  . OA (osteoarthritis) of knee [M17.10] 02/22/2013  . TIA (transient ischemic attack) [G45.9] 11/07/2011  . Urinary retention [R33.9] 04/13/2011  . NEVUS, ATYPICAL [D23.9] 08/26/2010  . ECZEMA [L25.9] 05/27/2010  . Atrial fibrillation ([place]) [I48.91] 04/29/2010  . ROTATOR CUFF INJURY, RIGHT SHOULDER [M71.9, M67.919] 09/16/2009  . Essential hypertension [I10] 04/23/2009  . BENIGN PROSTATIC HYPERTROPHY, WITH OBSTRUCTION [N40.1] 04/23/2009  . ARTHRITIS [M12.9] 04/23/2009  . HYPERCHOLESTEROLEMIA, MIXED [E78.00] 04/22/2008  . Automatic implantable cardioverter-defibrillator in situ [Z95.810] 04/22/2008    Total Time spent with patient: 1 hour  Subjective:   Jacob [name] is a 83 y.o. male patient admitted with confusion.  HPI:  Jacob [name] is a 83 y.o. male, seen, chart reviewed and case discussed with LCSW and tried hospitalist for this face-to-face psychiatric  consultation and evaluation of increased confusion and hallucinations. Patient endorses symptoms of depression since December 2017 has lost his spouse. Patient reportedly doing fine with his current antidepressant medication. Patient appeared staying in his bed, calm, cooperative and pleasant during my evaluation. Patient reported he has visual hallucinations of his grandchildren from the photoframe coming alive, not sleeping well and confused. Reportedly patient has been suffering with pneumonia and also urinary tract infection which resulted his primary care physician sent him to the emergency department for inpatient care. Patient denies symptoms of depression, anxiety, auditory hallucinations, paranoid delusions. Patient has no suicidal or homicidal ideation, intention or plans. Patient denies use of alcohol or illicit drugs. Patient is hoping to go back to his family when medically stable, reportedly his family is supportive to him.   Past Psychiatric History: Depression, has been receiving Cymbalta 40 mg from primary care physician. Patient has no previous acute psychiatric hospitalization or outpatient care.  Risk to Self: Is patient at risk for suicide?: No Risk to Others:   Prior Inpatient Therapy:   Prior Outpatient Therapy:    Past Medical History:  Past Medical History:  Diagnosis Date  . AICD (automatic cardioverter/defibrillator) present   . Arthritis   . Atrial fibrillation ([place])   . Benign prostatic hypertrophy   . BPH (benign prostatic hyperplasia)   . CHF (congestive heart failure) ([place])   . Chronic systolic heart failure (HCC)    a. EF 25-30% in past; b.Echo 11/08/11: Normal LV wall thickness, EF 45-50%. c. Echo 04/17: EF 15-20%, diffuse akinesis, moderate MR, mod dilated LA, trivial PE   . Coronary artery disease    a. Chronically occ RCA. cardiac cath 10/2010: Mid LAD 20-30%, distal LAD 40%, mid D1 40%, mid circumflex 20-30%, distal  RCA occluded with left to right collaterals.  c. 10/2015: cath- relatively stable dz since 2012.    . Depression   . Esophageal stricture   . GERD (gastroesophageal reflux disease)   . Hemorrhoids   . History of diverticulitis of colon   . History of kidney stones   . History of nephrolithiasis   . HLD (hyperlipidemia)   . HTN (hypertension)   . Ischemic cardiomyopathy    a. s/p ICD 2008   . Myocardial infarction ([place])   . Pneumonia   . Renal insufficiency    Renal failure in July 2012 secondary to hypotension, ARB use, and   . Skin cancer    skin neck  . Stroke ([place])    tia's  . TIA (transient ischemic attack) 10/2011   carotids neg for ICA stenosis  . Urinary retention    foley indwelling 12/2010-04/2011    Past Surgical History:  Procedure Laterality Date  . CARDIAC CATHETERIZATION  11-05-10  . CARDIAC CATHETERIZATION N/A 10/14/2015   Procedure: Right/Left Heart Cath and Coronary Angiography;  Surgeon: Leonie Man, MD;  Location: [facility];  Service: Cardiovascular;  Laterality: N/A;  . CARDIAC DEFIBRILLATOR PLACEMENT  12/2006   AICD  . CATARACTS REMOVED    . EP IMPLANTABLE DEVICE N/A 11/11/2015   Procedure:  ICD Generator Changeout;  Surgeon: Evans Lance, MD;  Location: [facility];  Service: Cardiovascular;  Laterality: N/A;  . ESOPHAGOGASTRODUODENOSCOPY N/A 01/24/2014   Procedure: ESOPHAGOGASTRODUODENOSCOPY (EGD);  Surgeon: Milus Banister, MD;  Location: [facility];  Service: Endoscopy;  Laterality: N/A;  . ESOPHAGOGASTRODUODENOSCOPY N/A 02/14/2016   Procedure: ESOPHAGOGASTRODUODENOSCOPY (EGD);  Surgeon: Mauri Pole, MD;  Location: [facility];  Service: Endoscopy;  Laterality: N/A;  . JOINT REPLACEMENT    . TOTAL KNEE ARTHROPLASTY  01/04/11   L TKA  . TRANSURETHRAL RESECTION OF PROSTATE  06/14/2011   Procedure: TRANSURETHRAL RESECTION OF THE PROSTATE (TURP);  Surgeon: Bernestine Amass, MD;  Location: [facility];  Service: Urology;  Laterality: N/A;   Family History:  Family History  Problem  Relation Age of Onset  . Heart attack Mother 70       pt unsure of age  . Liver disease Father 66  . Heart disease Son        x 2  . Colon cancer Neg Hx    Family Psychiatric  History: Unknown  Social History:  History  Alcohol Use No     History  Drug Use No    Social History   Social History  . Marital status: Married    Spouse name: N/A  . Number of children: N/A  . Years of education: N/A   Occupational History  . retired    Social History Main Topics  . Smoking status: Former Smoker    Packs/day: 2.00    Years: 30.00    Quit date: 06/14/1968  . Smokeless tobacco: Never Used  . Alcohol use No  . Drug use: No  . Sexual activity: Not Currently   Other Topics Concern  . None   Social History Narrative   Daily caffeine    Additional Social History:    Allergies:   Allergies  Allergen Reactions  . Bactrim [Sulfamethoxazole-Trimethoprim] Other (See Comments)    Throat swelling  . Penicillins Itching, Swelling and Rash    Has patient had a PCN reaction causing immediate rash, facial/tongue/throat swelling, SOB or lightheadedness with hypotension:unsure Has patient had a PCN reaction causing  severe rash involving mucus membranes or skin necrosis:No Has patient had a PCN reaction that required hospitalization:No Has patient had a PCN reaction occurring within the last 10 years:No If all of the above answers are "NO", then may proceed with Cephalosporin use.     Labs:  Results for orders placed or performed during the hospital encounter of 11/16/16 (from the past 48 hour(s))  CBG monitoring, ED     Status: None   Collection Time: 11/16/16  4:20 PM  Result Value Ref Range   Glucose-Capillary 96 65 - 99 mg/dL  Urinalysis, Routine w reflex microscopic     Status: Abnormal   Collection Time: 11/16/16  4:26 PM  Result Value Ref Range   Color, Urine YELLOW YELLOW   APPearance CLEAR CLEAR   Specific Gravity, Urine 1.024 1.005 - 1.030   pH 5.0 5.0 - 8.0    Glucose, UA NEGATIVE NEGATIVE mg/dL   Hgb urine dipstick NEGATIVE NEGATIVE   Bilirubin Urine NEGATIVE NEGATIVE   Ketones, ur NEGATIVE NEGATIVE mg/dL   Protein, ur 30 (A) NEGATIVE mg/dL   Nitrite NEGATIVE NEGATIVE   Leukocytes, UA TRACE (A) NEGATIVE   RBC / HPF 0-5 0 - 5 RBC/hpf   WBC, UA 6-30 0 - 5 WBC/hpf   Bacteria, UA NONE SEEN NONE SEEN   Squamous Epithelial / LPF 0-5 (A) NONE SEEN   Mucous PRESENT    Hyaline Casts, UA PRESENT   Urine culture     Status: None   Collection Time: 11/16/16  4:26 PM  Result Value Ref Range   Specimen Description URINE, CLEAN CATCH    Special Requests NONE    Culture      NO GROWTH Performed at [facility], [address], [place], [place] [address]    Report Status 11/18/2016 FINAL   Urine rapid drug screen (hosp performed)     Status: None   Collection Time: 11/16/16  4:26 PM  Result Value Ref Range   Opiates NONE DETECTED NONE DETECTED   Cocaine NONE DETECTED NONE DETECTED   Benzodiazepines NONE DETECTED NONE DETECTED   Amphetamines NONE DETECTED NONE DETECTED   Tetrahydrocannabinol NONE DETECTED NONE DETECTED   Barbiturates NONE DETECTED NONE DETECTED    Comment:        DRUG SCREEN FOR MEDICAL PURPOSES ONLY.  IF CONFIRMATION IS NEEDED FOR ANY PURPOSE, NOTIFY LAB WITHIN 5 DAYS.        LOWEST DETECTABLE LIMITS FOR URINE DRUG SCREEN Drug Class       Cutoff (ng/mL) Amphetamine      1000 Barbiturate      200 Benzodiazepine   563 Tricyclics       893 Opiates          300 Cocaine          300 THC              50   Culture, blood (Routine x 2)     Status: None (Preliminary result)   Collection Time: 11/16/16  4:31 PM  Result Value Ref Range   Specimen Description LEFT ANTECUBITAL    Special Requests IN PEDIATRIC BOTTLE Blood Culture adequate volume    Culture      NO GROWTH < 24 HOURS Performed at [facility], [place] [address], [place], [place] [address]    Report Status PENDING   Culture, blood (Routine x 2)      Status: None (Preliminary result)   Collection Time: 11/16/16  5:03  PM  Result Value Ref Range   Specimen Description RIGHT ANTECUBITAL    Special Requests      BOTTLES DRAWN AEROBIC AND ANAEROBIC Blood Culture adequate volume   Culture      NO GROWTH < 24 HOURS Performed at [facility] [address], [place], [place] [address]    Report Status PENDING   Comprehensive metabolic panel     Status: Abnormal   Collection Time: 11/16/16  5:06 PM  Result Value Ref Range   Sodium 138 135 - 145 mmol/L   Potassium 3.5 3.5 - 5.1 mmol/L   Chloride 102 101 - 111 mmol/L   CO2 25 22 - 32 mmol/L   Glucose, Bld 112 (H) 65 - 99 mg/dL   BUN 21 (H) 6 - 20 mg/dL   Creatinine, Ser 1.62 (H) 0.61 - 1.24 mg/dL   Calcium 8.8 (L) 8.9 - 10.3 mg/dL   Total Protein 8.2 (H) 6.5 - 8.1 g/dL   Albumin 4.2 3.5 - 5.0 g/dL   AST 18 15 - 41 U/L   ALT 8 (L) 17 - 63 U/L   Alkaline Phosphatase 110 38 - 126 U/L   Total Bilirubin 1.0 0.3 - 1.2 mg/dL   GFR calc non Af Amer 38 (L) >60 mL/min   GFR calc Af Amer 44 (L) >60 mL/min    Comment: (NOTE) The eGFR has been calculated using the CKD EPI equation. This calculation has not been validated in all clinical situations. eGFR's persistently <60 mL/min signify possible Chronic Kidney Disease.    Anion gap 11 5 - 15  CBC     Status: Abnormal   Collection Time: 11/16/16  5:06 PM  Result Value Ref Range   WBC 7.4 4.0 - 10.5 K/uL   RBC 4.36 4.22 - 5.81 MIL/uL   Hemoglobin 12.4 (L) 13.0 - 17.0 g/dL   HCT 38.4 (L) 39.0 - 52.0 %   MCV 88.1 78.0 - 100.0 fL   MCH 28.4 26.0 - 34.0 pg   MCHC 32.3 30.0 - 36.0 g/dL   RDW 15.2 11.5 - 15.5 %   Platelets 222 150 - 400 K/uL  Protime-INR     Status: None   Collection Time: 11/16/16  5:06 PM  Result Value Ref Range   Prothrombin Time 14.1 11.4 - 15.2 seconds   INR 1.09   CG4 I-STAT (Lactic acid)     Status: None   Collection Time: 11/16/16  5:09 PM  Result Value Ref Range   Lactic Acid, Venous 1.11 0.5 - 1.9 mmol/L   TSH     Status: None   Collection Time: 11/16/16  5:09 PM  Result Value Ref Range   TSH 2.121 0.350 - 4.500 uIU/mL    Comment: Performed by a 3rd Generation assay with a functional sensitivity of <=0.01 uIU/mL.  Vitamin B12     Status: None   Collection Time: 11/16/16  5:09 PM  Result Value Ref Range   Vitamin B-12 320 180 - 914 pg/mL    Comment: (NOTE) This assay is not validated for testing neonatal or myeloproliferative syndrome specimens for Vitamin B12 levels. Performed at [facility], [place] [address], [place], [place] [address]   Glucose, capillary     Status: Abnormal   Collection Time: 11/16/16  5:47 PM  Result Value Ref Range   Glucose-Capillary 109 (H) 65 - 99 mg/dL  Ammonia     Status: Abnormal   Collection Time: 11/16/16  8:24 PM  Result  Value Ref Range   Ammonia <9 (L) 9 - 35 umol/L  RPR     Status: None   Collection Time: 11/16/16  8:24 PM  Result Value Ref Range   RPR Ser Ql Non Reactive Non Reactive    Comment: (NOTE) Performed At: [facility] [place], [place] [id number] Lindon Romp MD [id number]   HIV antibody     Status: None   Collection Time: 11/16/16  8:24 PM  Result Value Ref Range   HIV Screen 4th Generation wRfx Non Reactive Non Reactive    Comment: (NOTE) Performed At: [facility] [place], [place] [id number] Lindon Romp MD [id number]   Magnesium     Status: None   Collection Time: 11/17/16  6:42 AM  Result Value Ref Range   Magnesium 2.0 1.7 - 2.4 mg/dL  CBC WITH DIFFERENTIAL     Status: Abnormal   Collection Time: 11/17/16  6:47 AM  Result Value Ref Range   WBC 5.9 4.0 - 10.5 K/uL   RBC 3.74 (L) 4.22 - 5.81 MIL/uL   Hemoglobin 10.4 (L) 13.0 - 17.0 g/dL   HCT 32.8 (L) 39.0 - 52.0 %   MCV 87.7 78.0 - 100.0 fL   MCH 27.8 26.0 - 34.0 pg   MCHC 31.7 30.0 - 36.0 g/dL   RDW 15.3 11.5 - 15.5 %   Platelets 170 150 - 400 K/uL   Neutrophils Relative % 72 %   Neutro Abs 4.3  1.7 - 7.7 K/uL   Lymphocytes Relative 17 %   Lymphs Abs 1.0 0.7 - 4.0 K/uL   Monocytes Relative 9 %   Monocytes Absolute 0.5 0.1 - 1.0 K/uL   Eosinophils Relative 2 %   Eosinophils Absolute 0.1 0.0 - 0.7 K/uL   Basophils Relative 0 %   Basophils Absolute 0.0 0.0 - 0.1 K/uL  Basic metabolic panel     Status: Abnormal   Collection Time: 11/17/16  6:47 AM  Result Value Ref Range   Sodium 142 135 - 145 mmol/L   Potassium 3.6 3.5 - 5.1 mmol/L   Chloride 110 101 - 111 mmol/L   CO2 27 22 - 32 mmol/L   Glucose, Bld 82 65 - 99 mg/dL   BUN 16 6 - 20 mg/dL   Creatinine, Ser 1.28 (H) 0.61 - 1.24 mg/dL   Calcium 8.3 (L) 8.9 - 10.3 mg/dL   GFR calc non Af Amer 50 (L) >60 mL/min   GFR calc Af Amer 58 (L) >60 mL/min    Comment: (NOTE) The eGFR has been calculated using the CKD EPI equation. This calculation has not been validated in all clinical situations. eGFR's persistently <60 mL/min signify possible Chronic Kidney Disease.    Anion gap 5 5 - 15  Vitamin B12     Status: None   Collection Time: 11/17/16  6:47 AM  Result Value Ref Range   Vitamin B-12 304 180 - 914 pg/mL    Comment: (NOTE) This assay is not validated for testing neonatal or myeloproliferative syndrome specimens for Vitamin B12 levels. Performed at [facility], [place] [address] [place], [place] [address]   Folate     Status: Abnormal   Collection Time: 11/17/16  6:47 AM  Result Value Ref Range   Folate 5.5 (L) >5.9 ng/mL    Comment: Performed at [facility], [place] [address] [place], [place] [address]  Iron and TIBC     Status: Abnormal  Collection Time: 11/17/16  6:47 AM  Result Value Ref Range   Iron 43 (L) 45 - 182 ug/dL   TIBC 284 250 - 450 ug/dL   Saturation Ratios 15 (L) 17.9 - 39.5 %   UIBC 241 ug/dL    Comment: Performed at [facility] [address], [place], [place] [address]  Ferritin     Status: None   Collection Time: 11/17/16  6:47 AM  Result Value Ref Range   Ferritin 36 24  - 336 ng/mL    Comment: Performed at [facility] [address], [place], [place] [address]  RPR     Status: None   Collection Time: 11/17/16  9:55 AM  Result Value Ref Range   RPR Ser Ql Non Reactive Non Reactive    Comment: (NOTE) Performed At: [facility] [place], [place] [id number] Lindon Romp MD [id number]   HIV antibody (routine testing) (NOT for [facility])     Status: None   Collection Time: 11/17/16  9:55 AM  Result Value Ref Range   HIV Screen 4th Generation wRfx Non Reactive Non Reactive    Comment: (NOTE) Performed At: [facility] [place], [place] [id number] Lindon Romp MD [id number]   CBC     Status: Abnormal   Collection Time: 11/18/16  6:16 AM  Result Value Ref Range   WBC 5.9 4.0 - 10.5 K/uL   RBC 4.00 (L) 4.22 - 5.81 MIL/uL   Hemoglobin 11.2 (L) 13.0 - 17.0 g/dL   HCT 35.0 (L) 39.0 - 52.0 %   MCV 87.5 78.0 - 100.0 fL   MCH 28.0 26.0 - 34.0 pg   MCHC 32.0 30.0 - 36.0 g/dL   RDW 15.2 11.5 - 15.5 %   Platelets 178 150 - 400 K/uL  Basic metabolic panel     Status: Abnormal   Collection Time: 11/18/16  6:16 AM  Result Value Ref Range   Sodium 142 135 - 145 mmol/L   Potassium 3.7 3.5 - 5.1 mmol/L   Chloride 110 101 - 111 mmol/L   CO2 26 22 - 32 mmol/L   Glucose, Bld 83 65 - 99 mg/dL   BUN 11 6 - 20 mg/dL   Creatinine, Ser 1.20 0.61 - 1.24 mg/dL   Calcium 8.5 (L) 8.9 - 10.3 mg/dL   GFR calc non Af Amer 54 (L) >60 mL/min   GFR calc Af Amer >60 >60 mL/min    Comment: (NOTE) The eGFR has been calculated using the CKD EPI equation. This calculation has not been validated in all clinical situations. eGFR's persistently <60 mL/min signify possible Chronic Kidney Disease.    Anion gap 6 5 - 15    Current Facility-Administered Medications  Medication Dose Route Frequency Provider Last Rate Last Dose  . acetaminophen (TYLENOL) tablet 650 mg  650 mg Oral Q6H PRN Opyd, Ilene Qua, MD       Or  .  acetaminophen (TYLENOL) suppository 650 mg  650 mg Rectal Q6H PRN Opyd, Ilene Qua, MD      . bisacodyl (DULCOLAX) EC tablet 5 mg  5 mg Oral Daily PRN Opyd, Ilene Qua, MD      . cyanocobalamin ((VITAMIN B-12)) injection 1,000 mcg  1,000 mcg Intramuscular Daily Eugenie Filler, MD   1,000 mcg at 11/18/16 0947  . dabigatran (PRADAXA) capsule 150 mg  150 mg Oral BID Opyd, Ilene Qua, MD   150 mg at 11/17/16 2314  .  famotidine (PEPCID) tablet 20 mg  20 mg Oral QHS Opyd, Ilene Qua, MD   20 mg at 11/17/16 2314  . feeding supplement (ENSURE ENLIVE) (ENSURE ENLIVE) liquid 237 mL  237 mL Oral BID BM Eugenie Filler, MD   237 mL at 11/18/16 0947  . finasteride (PROSCAR) tablet 5 mg  5 mg Oral Daily Opyd, Ilene Qua, MD   5 mg at 11/17/16 0951  . folic acid (FOLVITE) tablet 1 mg  1 mg Oral Daily Eugenie Filler, MD   1 mg at 11/17/16 1454  . HYDROcodone-acetaminophen (NORCO/VICODIN) 5-325 MG per tablet 1 tablet  1 tablet Oral Q6H PRN Opyd, Ilene Qua, MD      . isosorbide mononitrate (IMDUR) 24 hr tablet 30 mg  30 mg Oral Daily Opyd, Ilene Qua, MD   30 mg at 11/17/16 0951  . metoprolol tartrate (LOPRESSOR) tablet 25 mg  25 mg Oral BID Opyd, Ilene Qua, MD   25 mg at 11/17/16 2315  . ondansetron (ZOFRAN) tablet 4 mg  4 mg Oral Q6H PRN Opyd, Ilene Qua, MD       Or  . ondansetron (ZOFRAN) injection 4 mg  4 mg Intravenous Q6H PRN Opyd, Ilene Qua, MD      . pantoprazole (PROTONIX) EC tablet 40 mg  40 mg Oral Daily Opyd, Ilene Qua, MD   40 mg at 11/18/16 0954  . polyethylene glycol (MIRALAX / GLYCOLAX) packet 17 g  17 g Oral Daily PRN Opyd, Ilene Qua, MD      . rosuvastatin (CRESTOR) tablet 10 mg  10 mg Oral q1800 Opyd, Ilene Qua, MD   10 mg at 11/17/16 1719  . sucralfate (CARAFATE) 1 GM/10ML suspension 1 g  1 g Oral TID WC & HS Opyd, Ilene Qua, MD   1 g at 11/18/16 9983    Musculoskeletal: Strength & Muscle Tone: decreased Gait & Station: unable to stand Patient leans: N/A  Psychiatric Specialty  Exam: Physical Exam as per history and physical   ROS confused and visual hallucinations, reports burning sensation during urination. No Fever-chills, No Headache, No changes with Vision or hearing, reports vertigo No problems swallowing food or Liquids, No Chest pain, Cough or Shortness of Breath, No Abdominal pain, No Nausea or Vommitting, Bowel movements are regular, No Blood in stool or Urine, No dysuria, No new skin rashes or bruises, No new joints pains-aches,  No new weakness, tingling, numbness in any extremity, No recent weight gain or loss, No polyuria, polydypsia or polyphagia,   A full 10 point Review of Systems was done, except as stated above, all other Review of Systems were negative.  Blood pressure (!) 143/59, pulse 69, temperature 98.2 F (36.8 C), temperature source Oral, resp. rate 18, height 5' 8"  (1.727 m), weight 70.1 kg (154 lb 8.7 oz), SpO2 99 %.Body mass index is 23.5 kg/m.  General Appearance: Casual  Eye Contact:  Good  Speech:  Clear and Coherent, Slow and soft spoken  Volume:  Decreased  Mood:  Depressed  Affect:  Appropriate and Congruent  Thought Process:  Coherent and Goal Directed  Orientation:  Full (Time, Place, and Person)  Thought Content:  Hallucinations: Visual  Suicidal Thoughts:  No  Homicidal Thoughts:  No  Memory:  Immediate;   Good Recent;   Fair Remote;   Fair  Judgement:  Fair  Insight:  Fair  Psychomotor Activity:  Decreased  Concentration:  Concentration: Fair and Attention Span: Fair  Recall:  [employer] [employer] of  Knowledge:  Good  Language:  Good  Akathisia:  Negative  Handed:  Right  AIMS (if indicated):     Assets:  Communication Skills Desire for Improvement Financial Resources/Insurance Housing Leisure Time Resilience Social Support Transportation  ADL's:  Intact  Cognition:  WNL  Sleep:        Treatment Plan Summary: 83 years old male with recent history of depression, presented with confusion, visual  hallucinations and recent urinary tract infection and pneumonia.  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   Supportive therapy provided about ongoing stressors.  Ambrose Finland, MD 11/18/2016 10:56 AM

## 2016-11-18 NOTE — Evaluation (Signed)
Occupational Therapy Evaluation Patient Details Name: AIME MELOCHE MRN: [id number] DOB: 30-Mar-1932 Today's Date: 11/18/2016    History of Present Illness pt was admitted with hallucinations/confusion.  PMH:  CAD, A Fib, TIAs, CHF, HTN   Clinical Impression   This 83 year old man was admitted for the above.  Will follow in acute setting to increase safety and independence with adls.  Pt reports he is mod I at home.  He currently needs min guard and goals are for supervision level    Follow Up Recommendations  Supervision/Assistance - 24 hour    Equipment Recommendations   (to be further assessed)    Recommendations for Other Services       Precautions / Restrictions Precautions Precautions: Fall Restrictions Weight Bearing Restrictions: No      Mobility Bed Mobility Overal bed mobility: Needs Assistance Bed Mobility: Supine to Sit     Supine to sit: Supervision     General bed mobility comments: to watch lines; HOB up 20  Transfers Overall transfer level: Needs assistance Equipment used: Straight cane Transfers: Sit to/from Stand Sit to Stand: Min guard         General transfer comment: for safety; bed elevated    Balance                                           ADL either performed or assessed with clinical judgement   ADL Overall ADL's : Needs assistance/impaired     Grooming: Brushing hair;Set up;Sitting   Upper Body Bathing: Set up;Sitting   Lower Body Bathing: Min guard;Sit to/from stand   Upper Body Dressing : Set up;Sitting   Lower Body Dressing: Min guard;Sit to/from stand   Toilet Transfer: Min guard;Stand-pivot (to recliner, using SPC)             General ADL Comments: Pt has catheter. Stood at sink but did not want to complete oral care at this time.       Vision         Perception     Praxis      Pertinent Vitals/Pain Pain Assessment: No/denies pain     Hand Dominance     Extremity/Trunk  Assessment Upper Extremity Assessment Upper Extremity Assessment: Overall WFL for tasks assessed           Communication Communication Communication: HOH   Cognition Arousal/Alertness: Awake/alert Behavior During Therapy: WFL for tasks assessed/performed Overall Cognitive Status: No family/caregiver present to determine baseline cognitive functioning                                 General Comments: oriented to place and knew it was May or June.  During session, cognition WFLs   General Comments       Exercises     Shoulder Instructions      Home Living Family/patient expects to be discharged to:: Private residence Living Arrangements: Children;Other relatives Available Help at Discharge: Family               Bathroom Shower/Tub: Walk-in shower   Bathroom Toilet: Handicapped height     Home Equipment: [place] - single point;Walker - 2 wheels;Bedside commode;Shower seat;Wheelchair - manual          Prior Functioning/Environment Level of Independence: Independent with assistive device(s)  Comments: pt states he did not have help for walking, ADLs        OT Problem List: Decreased strength;Decreased activity tolerance;Impaired balance (sitting and/or standing);Decreased knowledge of use of DME or AE      OT Treatment/Interventions: Self-care/ADL training;DME and/or AE instruction;Patient/family education;Balance training    OT Goals(Current goals can be found in the care plan section) Acute Rehab OT Goals Patient Stated Goal: none stated; agreeable to OOB OT Goal Formulation: With patient Time For Goal Achievement: 11/25/16 Potential to Achieve Goals: Good ADL Goals Pt Will Perform Grooming: with supervision;standing Pt Will Transfer to Toilet: with supervision;ambulating;regular height toilet;bedside commode Pt Will Perform Toileting - Clothing Manipulation and hygiene: with supervision;sit to/from stand Additional ADL Goal #1: pt  will complete adl with set up/supervison sit to stand  OT Frequency: Min 2X/week   Barriers to D/C:            Co-evaluation              AM-PAC PT "6 Clicks" Daily Activity     Outcome Measure Help from another person eating meals?: None Help from another person taking care of personal grooming?: A Little Help from another person toileting, which includes using toliet, bedpan, or urinal?: A Little Help from another person bathing (including washing, rinsing, drying)?: A Little Help from another person to put on and taking off regular upper body clothing?: A Little Help from another person to put on and taking off regular lower body clothing?: A Little 6 Click Score: 19   End of Session    Activity Tolerance: Patient tolerated treatment well Patient left: in chair;with call bell/phone within reach;with chair alarm set  OT Visit Diagnosis: Muscle weakness (generalized) (M62.81)                Time: 6283-1517 OT Time Calculation (min): 18 min Charges:  OT General Charges $OT Visit: 1 Procedure OT Evaluation $OT Eval Low Complexity: 1 Procedure G-Codes:     [place], OTR/L [phone] 11/18/2016  Jais Demir 11/18/2016, 1:36 PM

## 2016-11-19 DIAGNOSIS — I5022 Chronic systolic (congestive) heart failure: Secondary | ICD-10-CM

## 2016-11-19 DIAGNOSIS — R339 Retention of urine, unspecified: Secondary | ICD-10-CM

## 2016-11-19 DIAGNOSIS — D649 Anemia, unspecified: Secondary | ICD-10-CM

## 2016-11-19 LAB — CBC WITH DIFFERENTIAL/PLATELET
BASOS ABS: 0 10*3/uL (ref 0.0–0.1)
BASOS PCT: 0 %
EOS ABS: 0.2 10*3/uL (ref 0.0–0.7)
Eosinophils Relative: 4 %
HEMATOCRIT: 33.5 % — AB (ref 39.0–52.0)
Hemoglobin: 10.6 g/dL — ABNORMAL LOW (ref 13.0–17.0)
Lymphocytes Relative: 27 %
Lymphs Abs: 1.5 10*3/uL (ref 0.7–4.0)
MCH: 27.6 pg (ref 26.0–34.0)
MCHC: 31.6 g/dL (ref 30.0–36.0)
MCV: 87.2 fL (ref 78.0–100.0)
Monocytes Absolute: 0.6 10*3/uL (ref 0.1–1.0)
Monocytes Relative: 10 %
NEUTROS ABS: 3.3 10*3/uL (ref 1.7–7.7)
NEUTROS PCT: 59 %
Platelets: 163 10*3/uL (ref 150–400)
RBC: 3.84 MIL/uL — AB (ref 4.22–5.81)
RDW: 15.2 % (ref 11.5–15.5)
WBC: 5.5 10*3/uL (ref 4.0–10.5)

## 2016-11-19 LAB — BASIC METABOLIC PANEL
ANION GAP: 5 (ref 5–15)
BUN: 12 mg/dL (ref 6–20)
CALCIUM: 8.2 mg/dL — AB (ref 8.9–10.3)
CO2: 26 mmol/L (ref 22–32)
Chloride: 110 mmol/L (ref 101–111)
Creatinine, Ser: 1.27 mg/dL — ABNORMAL HIGH (ref 0.61–1.24)
GFR calc Af Amer: 59 mL/min — ABNORMAL LOW (ref >60)
GFR, EST NON AFRICAN AMERICAN: 50 mL/min — AB (ref >60)
Glucose, Bld: 125 mg/dL — ABNORMAL HIGH (ref 65–99)
Potassium: 3.6 mmol/L (ref 3.5–5.1)
Sodium: 141 mmol/L (ref 135–145)

## 2016-11-19 LAB — FOLATE RBC

## 2016-11-19 MED ORDER — QUETIAPINE FUMARATE 25 MG PO TABS: 25.0000 mg | ORAL_TABLET | Freq: Every day | ORAL | 0 refills | Status: DC

## 2016-11-19 MED ORDER — ADULT MULTIVITAMIN W/MINERALS CH: 1.0000 | ORAL_TABLET | Freq: Every day | ORAL | 0 refills | Status: AC

## 2016-11-19 MED ORDER — ENSURE ENLIVE PO LIQD: 237.0000 mL | Freq: Two times a day (BID) | ORAL | 12 refills | Status: AC

## 2016-11-19 MED ORDER — DULOXETINE HCL 30 MG PO CPEP: 30.0000 mg | ORAL_CAPSULE | Freq: Every day | ORAL | 0 refills | Status: DC

## 2016-11-19 NOTE — Progress Notes (Addendum)
Date: November 19, 2016 Chart reviewed for discharge orders: [facility] notified of need for  Rn, PT, OT .  Per patient's request. Vernia Buff, [phone]

## 2016-11-19 NOTE — Progress Notes (Signed)
Date:  November 19, 2016  Chart reviewed for concurrent status and case management needs.  Will continue to follow patient progress. Continues with confusion,CKD stage 3-iv flds, Urinary retention-foley Discharge Planning: following for needs  Expected discharge date: [id number]  Velva Harman, BSN, [place], [place]

## 2016-11-19 NOTE — Discharge Summary (Addendum)
Physician Discharge Summary  SEDRIC GUIA DUK:[id number] DOB: 1932/03/02 DOA: 11/16/2016  PCP: Binnie Rail, MD  Admit date: 11/16/2016 Discharge date: 11/19/2016  Admitted From: Home  Disposition: Home   Recommendations for Outpatient Follow-up:  1. Follow up with PCP in 1- weeks 2. Patient will need to follow-up with urology in 1 week for voiding trial, patient discharged on indwelling Foley catheter 3.          Patient placed on Cymbalta and Seroquel.    Home Health: Yes  Equipment/Devices: No   Discharge Condition: Stable  CODE STATUS: Full  Diet recommendation: Heart Healthy   Brief/Interim Summary: 83 yo male presented with hallucinations and confusion. Patient know to have systolic chf, atrial fibrillation and depression. Poor appetite for several months and altered mental status 24 hours before admission. On the initial physical examination, afebrile and hemodynamically stable. Moist mucous membranes, lungs clear to auscultation, heart s1 and s2 present and rhythmic, abdomen soft and no lower extremity edema. Sodium 138, potassium 3.5, chloride 105, bicarbonate 25, glucose 112, BUN 21, creatinine 1.62, white count 7.4, hemoglobin 12.4, hematocrit 3.4, platelets 222, urinalysis with 6-30 white cells, negative nitrates, head CT with no acute intracranial findings, chronic ischemic microvascular disease and old central lacunar infarcts. Chest film negative for infiltrates. EKG was normal sinus rhythm, poor R-wave progression, LVH, lateral T-wave inversions (chronic changes).  Patient admitted with acute metabolic encephalopathy due to dehydration, complicated by acute kidney injury.  1. Metabolic encephalopathy due to dehydration, complicated by psychosis.. Patient was admitted to the medical unit, he was placed on IV fluids, neuro checks per unit protocol, he responded well to medical therapy. He was seen by physical therapy and occupational therapy, recommendation for home health PT,  supervised assistance and 24 hours. Patient was seen by psychiatry, recommendation to continue Cymbalta 30 mg daily and Seroquel 25 mg bedtime.   2. Acute kidney injury on chronic kidney disease stage III. Patient received IV fluids, bladder scan showed 500 mL of urinary retention, Foley catheter has been placed, patient will need follow-up as an outpatient for voiding trial in the next 7 days. Discharge creatinine 1.27, serum bicarbonate 26, potassium 3.6, sodium 141.  3. Paroxysmal atrial fibrillation. Patient remained on sinus rhythm, rate controlled, continue metoprolol for AV blockade and dabigatran for anticoagulation.   4. Urinary retention. Patient will be discharged with a Foley catheter, continue finasteride, outpatient follow-up with neurology for a voiding trial, urine culture was no growth.  5. Ischemic cardiomyopathy with chronic, stable systolic heart failure. Patient tolerated well to IV fluids, will continue isosorbide and metoprolol. Resume furosemide 40 mg daily. Rosuvastatin 10 mg daily.   6. Severe calorie protein malnutrition.   Nutrition recommendations with: Provide Ensure Enlive po BID, each supplement provides 350 kcal and 20 grams of protein.   7. Chronic multifactorial anemia likely related to chronic renal disease and iron deficiency. Hb and hct remained stable, at 10 and 33. Serum iron 43 with normal tibc and low transferrin saturation down to 15%. Will need close follow up as outpatient.   Discharge Diagnoses:  Principal Problem:   Acute encephalopathy Active Problems:   Essential hypertension   Atrial fibrillation (HCC)   Urinary retention   CAD (coronary artery disease)   Chronic systolic CHF (congestive heart failure) (HCC)   CKD (chronic kidney disease), stage III   Anemia   Dehydration   Low vitamin B12 level   Low folate   Encephalopathy    Discharge Instructions  Allergies as of 11/19/2016      Reactions   Bactrim  [sulfamethoxazole-trimethoprim] Other (See Comments)   Throat swelling   Penicillins Itching, Swelling, Rash   Has patient had a PCN reaction causing immediate rash, facial/tongue/throat swelling, SOB or lightheadedness with hypotension:unsure Has patient had a PCN reaction causing severe rash involving mucus membranes or skin necrosis:No Has patient had a PCN reaction that required hospitalization:No Has patient had a PCN reaction occurring within the last 10 years:No If all of the above answers are "NO", then may proceed with Cephalosporin use.      Medication List    TAKE these medications   dabigatran 150 MG Caps capsule Commonly known as:  PRADAXA Take 1 capsule (150 mg total) by mouth 2 (two) times daily.   DULoxetine 30 MG capsule Commonly known as:  CYMBALTA Take 1 capsule (30 mg total) by mouth daily. What changed:  medication strength  how much to take  additional instructions   feeding supplement (ENSURE ENLIVE) Liqd Take 237 mLs by mouth 2 (two) times daily between meals.   finasteride 5 MG tablet Commonly known as:  PROSCAR TAKE 1 TABLET(5 MG) BY MOUTH DAILY What changed:  See the new instructions.   fluticasone 50 MCG/ACT nasal spray Commonly known as:  FLONASE SHAKE LIQUID AND USE 2 SPRAYS IN EACH NOSTRIL DAILY AS NEEDED FOR ALLERGIES OR RHINITIS   furosemide 40 MG tablet Commonly known as:  LASIX TAKE 1 TABLET BY MOUTH DAILY What changed:  See the new instructions.   HYDROcodone-acetaminophen 5-325 MG tablet Commonly known as:  NORCO/VICODIN Take 1 tablet by mouth every 6 (six) hours as needed for severe pain.   isosorbide mononitrate 30 MG 24 hr tablet Commonly known as:  IMDUR TAKE 1 TABLET(30 MG) BY MOUTH DAILY   metoprolol tartrate 25 MG tablet Commonly known as:  LOPRESSOR TAKE 1 TABLET BY MOUTH TWICE DAILY   multivitamin with minerals Tabs tablet Take 1 tablet by mouth daily.   nitroGLYCERIN 0.4 MG SL tablet Commonly known as:   NITROSTAT PLACE 1 TABLET UNDER THE TONGUE EVERY 5 MINUTES FOR 3 DOSES AS NEEDED FOR CHEST PAIN   pantoprazole 40 MG tablet Commonly known as:  PROTONIX TAKE 1 TABLET BY MOUTH EVERY DAY   QUEtiapine 25 MG tablet Commonly known as:  SEROQUEL Take 1 tablet (25 mg total) by mouth at bedtime.   ranitidine 300 MG tablet Commonly known as:  ZANTAC TAKE 1 TABLET(300 MG) BY MOUTH AT BEDTIME   rosuvastatin 10 MG tablet Commonly known as:  CRESTOR TAKE 1 TABLET BY MOUTH AT BEDTIME   sucralfate 1 GM/10ML suspension Commonly known as:  CARAFATE Take 10 mLs (1 g total) by mouth 4 (four) times daily -  with meals and at bedtime. What changed:  when to take this  reasons to take this       Allergies  Allergen Reactions  . Bactrim [Sulfamethoxazole-Trimethoprim] Other (See Comments)    Throat swelling  . Penicillins Itching, Swelling and Rash    Has patient had a PCN reaction causing immediate rash, facial/tongue/throat swelling, SOB or lightheadedness with hypotension:unsure Has patient had a PCN reaction causing severe rash involving mucus membranes or skin necrosis:No Has patient had a PCN reaction that required hospitalization:No Has patient had a PCN reaction occurring within the last 10 years:No If all of the above answers are "NO", then may proceed with Cephalosporin use.     Consultations:  Psychiatry   Procedures/Studies: Dg Chest 2 View  Result Date: 11/16/2016 CLINICAL DATA:  Confusion EXAM: CHEST  2 VIEW COMPARISON:  Nov 04, 2016 FINDINGS: There is no edema or consolidation. The heart size and pulmonary vascularity are normal. Pacemaker leads are attached to the right atrium and right ventricle. No adenopathy. There is aortic atherosclerosis. There is degenerative change in the thoracic spine. IMPRESSION: Pacemaker leads attached to right atrium and right ventricle. No edema or consolidation. There is aortic atherosclerosis. Electronically Signed   By: Lowella Grip  III M.D.   On: 11/16/2016 16:52   Dg Chest 2 View  Result Date: 11/04/2016 CLINICAL DATA:  83 year old male with possible left lung base pneumonia on recent chest radiographs performed for weakness. EXAM: CHEST  2 VIEW COMPARISON:  10/29/2016 and earlier. FINDINGS: Stable left chest dual lead cardiac AICD. Stable cardiac size and mediastinal contours. Trace bilateral pleural effusions suspected. The lung bases otherwise are clear. No pneumothorax or pulmonary edema. Mild apical lung scarring. No confluent pulmonary opacity. No acute osseous abnormality identified. Negative visible bowel gas pattern. IMPRESSION: 1. Trace bilateral pleural effusions suspected. 2. No pneumonia or other acute cardiopulmonary abnormality identified. Electronically Signed   By: Genevie Ann M.D.   On: 11/04/2016 14:44   Dg Chest 2 View  Result Date: 10/29/2016 CLINICAL DATA:  Initial evaluation for acute weakness, altered mental status. EXAM: CHEST  2 VIEW COMPARISON:  Prior radiograph from 02/14/2016. FINDINGS: [place] Free left-sided transvenous pacemaker/ AICD in place. Stable cardiomegaly. Mediastinal silhouette normal. Aortic atherosclerosis. Lungs are hypoinflated. Patchy opacity within the retrocardiac left lower lobe, which may reflect atelectasis or infiltrate. Diffuse pulmonary vascular congestion without overt pulmonary edema. No pleural effusion. No pneumothorax. Irregularity at the left lateral eighth and ninth ribs, suspicious for possible rib fractures. No other acute osseus abnormality. IMPRESSION: 1. Cortical regularity at the left lateral eighth and ninth ribs, suspicious for acute minimally displaced rib fractures. Correlation with physical exam recommended. 2. Mild patchy left basilar opacity within the retrocardiac left lower lobe, which may reflect atelectasis or infiltrate. 3. Stable cardiomegaly with mild diffuse pulmonary vascular congestion without overt pulmonary edema. 4. Aortic atherosclerosis. Electronically  Signed   By: Jeannine Boga M.D.   On: 10/29/2016 16:05   Dg Pelvis 1-2 Views  Result Date: 10/29/2016 CLINICAL DATA:  Initial evaluation for acute generalized weakness. Fall. EXAM: PELVIS - 1-2 VIEW COMPARISON:  None. FINDINGS: No acute fracture or dislocation. Femoral heads normally aligned. Femoral head heights preserved. Bony pelvis intact. SI joints approximated. No pubic diastasis. Degenerative osteoarthritic changes about the hips bilaterally. Prominent degenerative changes noted with the lower lumbar spine. No soft tissue abnormality. IMPRESSION: No acute osseus abnormality about the pelvis. Electronically Signed   By: Jeannine Boga M.D.   On: 10/29/2016 16:10   Ct Head Wo Contrast  Result Date: 11/16/2016 CLINICAL DATA:  Confusion with hallucinations and frequent urination. EXAM: CT HEAD WITHOUT CONTRAST TECHNIQUE: Contiguous axial images were obtained from the base of the skull through the vertex without intravenous contrast. COMPARISON:  10/11/2015 and 10/29/2016 FINDINGS: Brain: The ventricles, cisterns and other CSF spaces are within normal. There is moderate chronic ischemic microvascular disease. Small left-sided lacunar infarcts over the basal ganglia/periventricular region. Bilateral basal ganglia calcifications. No evidence of mass, mass effect, shift of midline structures or acute hemorrhage. No evidence of acute infarction. Vascular: No hyperdense vessel or unexpected calcification. Skull: Normal. Negative for fracture or focal lesion. Sinuses/Orbits: No acute finding. Other: None. IMPRESSION: No acute intracranial findings. Chronic ischemic microvascular disease and old central lacunar  infarcts. Electronically Signed   By: Marin Olp M.D.   On: 11/16/2016 18:06   Ct Head Wo Contrast  Result Date: 10/29/2016 CLINICAL DATA:  Eval for bleed   Hx of TIA  Stroke CHF  CAD EXAM: CT HEAD WITHOUT CONTRAST TECHNIQUE: Contiguous axial images were obtained from the base of the  skull through the vertex without intravenous contrast. COMPARISON:  10/11/2015 FINDINGS: Brain: Diffuse parenchymal atrophy. Patchy areas of hypoattenuation in deep and periventricular white matter bilaterally. Negative for acute intracranial hemorrhage, mass lesion, acute infarction, midline shift, or mass-effect. Acute infarct may be inapparent on noncontrast CT. Ventricles and sulci symmetric. Old left caudate and external capsule lacunar infarcts. Stable mineralization in bilateral basal ganglia. Vascular: Atherosclerotic and physiologic intracranial calcifications. Skull: Bone windows demonstrate no focal lesion. Sinuses/Orbits: No acute finding. Other: None. IMPRESSION: 1. Negative for bleed or other acute intracranial process. 2. Mild atrophy and nonspecific white matter changes as above. 3. Stable old lacunar infarcts in the left caudate and external capsule. Electronically Signed   By: Lucrezia Europe M.D.   On: 10/29/2016 16:44       Subjective: Patient feeling better, no further confusion or agitation, no chest pain, no dyspnea, no nausea or vomiting. No abdominal pain.   Discharge Exam: Vitals:   11/19/16 0506 11/19/16 1039  BP: 126/73 (!) 155/81  Pulse: 64 70  Resp: 18   Temp: 97.5 F (36.4 C)    Vitals:   11/18/16 2048 11/19/16 0500 11/19/16 0506 11/19/16 1039  BP: 130/83  126/73 (!) 155/81  Pulse: 67  64 70  Resp: 18  18   Temp: 97.7 F (36.5 C)  97.5 F (36.4 C)   TempSrc: Oral  Oral   SpO2: 99%  96%   Weight:  71.2 kg (156 lb 15.5 oz)    Height:        General: Pt is alert, awake, not in acute distress E ENT. Mild pallor, no icterus, oral mucosa moist.  Cardiovascular: RRR, S1/S2 +, no rubs, no gallops Respiratory: CTA bilaterally, no wheezing, no rhonchi Abdominal: Soft, NT, ND, bowel sounds + Extremities: no edema, no cyanosis    The results of significant diagnostics from this hospitalization (including imaging, microbiology, ancillary and laboratory) are  listed below for reference.     Microbiology: Recent Results (from the past 240 hour(s))  Urine culture     Status: None   Collection Time: 11/16/16  4:26 PM  Result Value Ref Range Status   Specimen Description URINE, CLEAN CATCH  Final   Special Requests NONE  Final   Culture   Final    NO GROWTH Performed at [facility], [address], [place], [place] [address]    Report Status 11/18/2016 FINAL  Final  Culture, blood (Routine x 2)     Status: None (Preliminary result)   Collection Time: 11/16/16  4:31 PM  Result Value Ref Range Status   Specimen Description LEFT ANTECUBITAL  Final   Special Requests IN PEDIATRIC BOTTLE Blood Culture adequate volume  Final   Culture   Final    NO GROWTH 3 DAYS Performed at [facility], [place] [address] [place], [place] [address]    Report Status PENDING  Incomplete  Culture, blood (Routine x 2)     Status: None (Preliminary result)   Collection Time: 11/16/16  5:03 PM  Result Value Ref Range Status   Specimen Description RIGHT ANTECUBITAL  Final   Special Requests  Final    BOTTLES DRAWN AEROBIC AND ANAEROBIC Blood Culture adequate volume   Culture   Final    NO GROWTH 3 DAYS Performed at [facility], [place] [address] [place], [place] [address]    Report Status PENDING  Incomplete     Labs: BNP (last 3 results) No results for input(s): BNP in the last 8760 hours. Basic Metabolic Panel:  Recent Labs Lab 11/16/16 1706 11/17/16 0642 11/17/16 0647 11/18/16 0616 11/19/16 0556  NA 138  --  142 142 141  K 3.5  --  3.6 3.7 3.6  CL 102  --  110 110 110  CO2 25  --  27 26 26   GLUCOSE 112*  --  82 83 125*  BUN 21*  --  16 11 12   CREATININE 1.62*  --  1.28* 1.20 1.27*  CALCIUM 8.8*  --  8.3* 8.5* 8.2*  MG  --  2.0  --   --   --    Liver Function Tests:  Recent Labs Lab 11/16/16 1706  AST 18  ALT 8*  ALKPHOS 110  BILITOT 1.0  PROT 8.2*  ALBUMIN 4.2   No results for input(s): LIPASE, AMYLASE in the  last 168 hours.  Recent Labs Lab 11/16/16 2024  AMMONIA <9*   CBC:  Recent Labs Lab 11/16/16 1706 11/17/16 0647 11/18/16 0616 11/19/16 0556  WBC 7.4 5.9 5.9 5.5  NEUTROABS  --  4.3  --  3.3  HGB 12.4* 10.4* 11.2* 10.6*  HCT 38.4* 32.8* 35.0* 33.5*  MCV 88.1 87.7 87.5 87.2  PLT 222 170 178 163   Cardiac Enzymes: No results for input(s): CKTOTAL, CKMB, CKMBINDEX, TROPONINI in the last 168 hours. BNP: Invalid input(s): POCBNP CBG:  Recent Labs Lab 11/16/16 1620 11/16/16 1747  GLUCAP 96 109*   D-Dimer No results for input(s): DDIMER in the last 72 hours. Hgb A1c No results for input(s): HGBA1C in the last 72 hours. Lipid Profile No results for input(s): CHOL, HDL, LDLCALC, TRIG, CHOLHDL, LDLDIRECT in the last 72 hours. Thyroid function studies  Recent Labs  11/16/16 1709  TSH 2.121   Anemia work up  Recent Labs  11/16/16 1709 11/17/16 0647  VITAMINB12 320 304  FOLATE  --  5.5*  FERRITIN  --  36  TIBC  --  284  IRON  --  43*   Urinalysis    Component Value Date/Time   COLORURINE YELLOW 11/16/2016 1626   APPEARANCEUR CLEAR 11/16/2016 1626   LABSPEC 1.024 11/16/2016 1626   PHURINE 5.0 11/16/2016 1626   GLUCOSEU NEGATIVE 11/16/2016 1626   GLUCOSEU NEGATIVE 05/19/2011 1315   HGBUR NEGATIVE 11/16/2016 1626   BILIRUBINUR NEGATIVE 11/16/2016 1626   BILIRUBINUR neg 11/04/2016 1413   KETONESUR NEGATIVE 11/16/2016 1626   PROTEINUR 30 (A) 11/16/2016 1626   UROBILINOGEN 0.2 11/04/2016 1413   UROBILINOGEN 0.2 12/02/2011 2131   NITRITE NEGATIVE 11/16/2016 1626   LEUKOCYTESUR TRACE (A) 11/16/2016 1626   Sepsis Labs Invalid input(s): PROCALCITONIN,  WBC,  LACTICIDVEN Microbiology Recent Results (from the past 240 hour(s))  Urine culture     Status: None   Collection Time: 11/16/16  4:26 PM  Result Value Ref Range Status   Specimen Description URINE, CLEAN CATCH  Final   Special Requests NONE  Final   Culture   Final    NO GROWTH Performed at [facility], [place] [address] [place], [place] [address]    Report Status 11/18/2016 FINAL  Final  Culture, blood (Routine x 2)     Status: None (Preliminary result)   Collection Time: 11/16/16  4:31 PM  Result Value Ref Range Status   Specimen Description LEFT ANTECUBITAL  Final   Special Requests IN PEDIATRIC BOTTLE Blood Culture adequate volume  Final   Culture   Final    NO GROWTH 3 DAYS Performed at [facility], [place] [address], [place], [place] [address]    Report Status PENDING  Incomplete  Culture, blood (Routine x 2)     Status: None (Preliminary result)   Collection Time: 11/16/16  5:03 PM  Result Value Ref Range Status   Specimen Description RIGHT ANTECUBITAL  Final   Special Requests   Final    BOTTLES DRAWN AEROBIC AND ANAEROBIC Blood Culture adequate volume   Culture   Final    NO GROWTH 3 DAYS Performed at [facility], [address] [place], [place] [address]    Report Status PENDING  Incomplete     Time coordinating discharge: 45 minutes  SIGNED:   Tawni Millers, MD  [facility] 11/19/2016, 12:11 PM Pager   If 7PM-7AM, please contact night-coverage [email or web address] Password [email or web address]

## 2016-11-21 LAB — CULTURE, BLOOD (ROUTINE X 2)
Culture: NO GROWTH
Culture: NO GROWTH
SPECIAL REQUESTS: ADEQUATE
Special Requests: ADEQUATE

## 2016-11-23 ENCOUNTER — Telehealth: Payer: Self-pay | Admitting: Internal Medicine

## 2016-11-23 DIAGNOSIS — I255 Ischemic cardiomyopathy: Secondary | ICD-10-CM | POA: Diagnosis not present

## 2016-11-23 DIAGNOSIS — S2241XD Multiple fractures of ribs, right side, subsequent encounter for fracture with routine healing: Secondary | ICD-10-CM | POA: Diagnosis not present

## 2016-11-23 DIAGNOSIS — I480 Paroxysmal atrial fibrillation: Secondary | ICD-10-CM | POA: Diagnosis not present

## 2016-11-23 DIAGNOSIS — I2510 Atherosclerotic heart disease of native coronary artery without angina pectoris: Secondary | ICD-10-CM | POA: Diagnosis not present

## 2016-11-23 DIAGNOSIS — Z95810 Presence of automatic (implantable) cardiac defibrillator: Secondary | ICD-10-CM | POA: Diagnosis not present

## 2016-11-23 DIAGNOSIS — I130 Hypertensive heart and chronic kidney disease with heart failure and stage 1 through stage 4 chronic kidney disease, or unspecified chronic kidney disease: Secondary | ICD-10-CM | POA: Diagnosis not present

## 2016-11-23 DIAGNOSIS — R339 Retention of urine, unspecified: Secondary | ICD-10-CM | POA: Diagnosis not present

## 2016-11-23 DIAGNOSIS — N183 Chronic kidney disease, stage 3 (moderate): Secondary | ICD-10-CM | POA: Diagnosis not present

## 2016-11-23 DIAGNOSIS — F329 Major depressive disorder, single episode, unspecified: Secondary | ICD-10-CM | POA: Diagnosis not present

## 2016-11-23 DIAGNOSIS — G9340 Encephalopathy, unspecified: Secondary | ICD-10-CM | POA: Diagnosis not present

## 2016-11-23 DIAGNOSIS — I5022 Chronic systolic (congestive) heart failure: Secondary | ICD-10-CM | POA: Diagnosis not present

## 2016-11-23 NOTE — Telephone Encounter (Signed)
Spoke with Donita to give verbal orders per MD.

## 2016-11-23 NOTE — Telephone Encounter (Signed)
Pt was recently hospitalized and is being sent home today. Donita from [place] is requesting verbal orders to be able to visit the pt in his home for at least 60 days.

## 2016-11-23 NOTE — Telephone Encounter (Signed)
Needs verbals for speech therapy for an evaluation   Needs verbals for a medical social Jacob [name] has a lot of depression going on and would like info on community resources

## 2016-11-23 NOTE — Telephone Encounter (Signed)
Verbal orders given to [place] per MD.

## 2016-11-24 ENCOUNTER — Telehealth: Payer: Self-pay

## 2016-11-24 NOTE — Telephone Encounter (Signed)
Sent note to medical records to be faxed to [facility] [phone]. Dr. Lovena Le states pt is at best mod to high risk for surgery.

## 2016-11-25 ENCOUNTER — Telehealth: Payer: Self-pay | Admitting: Internal Medicine

## 2016-11-25 DIAGNOSIS — I5022 Chronic systolic (congestive) heart failure: Secondary | ICD-10-CM | POA: Diagnosis not present

## 2016-11-25 DIAGNOSIS — N183 Chronic kidney disease, stage 3 (moderate): Secondary | ICD-10-CM | POA: Diagnosis not present

## 2016-11-25 DIAGNOSIS — I130 Hypertensive heart and chronic kidney disease with heart failure and stage 1 through stage 4 chronic kidney disease, or unspecified chronic kidney disease: Secondary | ICD-10-CM | POA: Diagnosis not present

## 2016-11-25 DIAGNOSIS — I2510 Atherosclerotic heart disease of native coronary artery without angina pectoris: Secondary | ICD-10-CM | POA: Diagnosis not present

## 2016-11-25 DIAGNOSIS — R339 Retention of urine, unspecified: Secondary | ICD-10-CM | POA: Diagnosis not present

## 2016-11-25 DIAGNOSIS — G9340 Encephalopathy, unspecified: Secondary | ICD-10-CM | POA: Diagnosis not present

## 2016-11-25 NOTE — Telephone Encounter (Signed)
noted 

## 2016-11-25 NOTE — Telephone Encounter (Signed)
Merry Proud from [place] called requesting verbal orders for Home Health PT for two times a week for 4 weeks for lower extremity strengthening, gait, balance, and endurance training.

## 2016-11-25 NOTE — Telephone Encounter (Signed)
FYI

## 2016-11-25 NOTE — Telephone Encounter (Signed)
Hilda Blades a Education officer, museum from [place] called to let Dr Quay Burow know that there has been a delay in her visit with the pt for a depression screening. She is planning to visit the pt tomorrow or Monday.

## 2016-11-26 DIAGNOSIS — I5022 Chronic systolic (congestive) heart failure: Secondary | ICD-10-CM | POA: Diagnosis not present

## 2016-11-26 DIAGNOSIS — I2510 Atherosclerotic heart disease of native coronary artery without angina pectoris: Secondary | ICD-10-CM | POA: Diagnosis not present

## 2016-11-26 DIAGNOSIS — R339 Retention of urine, unspecified: Secondary | ICD-10-CM | POA: Diagnosis not present

## 2016-11-26 DIAGNOSIS — I130 Hypertensive heart and chronic kidney disease with heart failure and stage 1 through stage 4 chronic kidney disease, or unspecified chronic kidney disease: Secondary | ICD-10-CM | POA: Diagnosis not present

## 2016-11-26 DIAGNOSIS — N183 Chronic kidney disease, stage 3 (moderate): Secondary | ICD-10-CM | POA: Diagnosis not present

## 2016-11-26 DIAGNOSIS — G9340 Encephalopathy, unspecified: Secondary | ICD-10-CM | POA: Diagnosis not present

## 2016-11-26 NOTE — Telephone Encounter (Signed)
LVM giving verbal orders for PT per MD 

## 2016-11-29 ENCOUNTER — Telehealth: Payer: Self-pay | Admitting: Internal Medicine

## 2016-11-29 DIAGNOSIS — G9340 Encephalopathy, unspecified: Secondary | ICD-10-CM | POA: Diagnosis not present

## 2016-11-29 DIAGNOSIS — I2510 Atherosclerotic heart disease of native coronary artery without angina pectoris: Secondary | ICD-10-CM | POA: Diagnosis not present

## 2016-11-29 DIAGNOSIS — I5022 Chronic systolic (congestive) heart failure: Secondary | ICD-10-CM | POA: Diagnosis not present

## 2016-11-29 DIAGNOSIS — I130 Hypertensive heart and chronic kidney disease with heart failure and stage 1 through stage 4 chronic kidney disease, or unspecified chronic kidney disease: Secondary | ICD-10-CM | POA: Diagnosis not present

## 2016-11-29 DIAGNOSIS — R339 Retention of urine, unspecified: Secondary | ICD-10-CM | POA: Diagnosis not present

## 2016-11-29 DIAGNOSIS — N183 Chronic kidney disease, stage 3 (moderate): Secondary | ICD-10-CM | POA: Diagnosis not present

## 2016-11-29 NOTE — Telephone Encounter (Signed)
Verbal Orders for OT. She wants to see how it goes with PT first.  1x eow for 3 weeks 1x3  [place] - OT - [phone]

## 2016-11-29 NOTE — Telephone Encounter (Signed)
LVM with Rosann Auerbach to give verbal orders for OT per MD.

## 2016-11-30 ENCOUNTER — Telehealth: Payer: Self-pay | Admitting: Internal Medicine

## 2016-11-30 DIAGNOSIS — I5022 Chronic systolic (congestive) heart failure: Secondary | ICD-10-CM | POA: Diagnosis not present

## 2016-11-30 DIAGNOSIS — G9340 Encephalopathy, unspecified: Secondary | ICD-10-CM | POA: Diagnosis not present

## 2016-11-30 DIAGNOSIS — N401 Enlarged prostate with lower urinary tract symptoms: Secondary | ICD-10-CM | POA: Diagnosis not present

## 2016-11-30 DIAGNOSIS — N183 Chronic kidney disease, stage 3 (moderate): Secondary | ICD-10-CM | POA: Diagnosis not present

## 2016-11-30 DIAGNOSIS — I2510 Atherosclerotic heart disease of native coronary artery without angina pectoris: Secondary | ICD-10-CM | POA: Diagnosis not present

## 2016-11-30 DIAGNOSIS — R338 Other retention of urine: Secondary | ICD-10-CM | POA: Diagnosis not present

## 2016-11-30 DIAGNOSIS — R339 Retention of urine, unspecified: Secondary | ICD-10-CM | POA: Diagnosis not present

## 2016-11-30 DIAGNOSIS — I130 Hypertensive heart and chronic kidney disease with heart failure and stage 1 through stage 4 chronic kidney disease, or unspecified chronic kidney disease: Secondary | ICD-10-CM | POA: Diagnosis not present

## 2016-11-30 NOTE — Telephone Encounter (Signed)
Spoke with Sonia Baller to give verbal orders per MDs approval.

## 2016-11-30 NOTE — Telephone Encounter (Signed)
Jacob [name] from [place] is requesting verbal orders to continue to see the pt one more time this week and twice a week for two weeks for speech therapy.

## 2016-12-01 DIAGNOSIS — G9340 Encephalopathy, unspecified: Secondary | ICD-10-CM | POA: Diagnosis not present

## 2016-12-01 DIAGNOSIS — I130 Hypertensive heart and chronic kidney disease with heart failure and stage 1 through stage 4 chronic kidney disease, or unspecified chronic kidney disease: Secondary | ICD-10-CM | POA: Diagnosis not present

## 2016-12-01 DIAGNOSIS — R339 Retention of urine, unspecified: Secondary | ICD-10-CM | POA: Diagnosis not present

## 2016-12-01 DIAGNOSIS — N183 Chronic kidney disease, stage 3 (moderate): Secondary | ICD-10-CM | POA: Diagnosis not present

## 2016-12-01 DIAGNOSIS — I2510 Atherosclerotic heart disease of native coronary artery without angina pectoris: Secondary | ICD-10-CM | POA: Diagnosis not present

## 2016-12-01 DIAGNOSIS — I5022 Chronic systolic (congestive) heart failure: Secondary | ICD-10-CM | POA: Diagnosis not present

## 2016-12-02 DIAGNOSIS — I5022 Chronic systolic (congestive) heart failure: Secondary | ICD-10-CM | POA: Diagnosis not present

## 2016-12-02 DIAGNOSIS — I2510 Atherosclerotic heart disease of native coronary artery without angina pectoris: Secondary | ICD-10-CM | POA: Diagnosis not present

## 2016-12-02 DIAGNOSIS — I130 Hypertensive heart and chronic kidney disease with heart failure and stage 1 through stage 4 chronic kidney disease, or unspecified chronic kidney disease: Secondary | ICD-10-CM | POA: Diagnosis not present

## 2016-12-02 DIAGNOSIS — N183 Chronic kidney disease, stage 3 (moderate): Secondary | ICD-10-CM | POA: Diagnosis not present

## 2016-12-02 DIAGNOSIS — G9340 Encephalopathy, unspecified: Secondary | ICD-10-CM | POA: Diagnosis not present

## 2016-12-02 DIAGNOSIS — R339 Retention of urine, unspecified: Secondary | ICD-10-CM | POA: Diagnosis not present

## 2016-12-03 DIAGNOSIS — I130 Hypertensive heart and chronic kidney disease with heart failure and stage 1 through stage 4 chronic kidney disease, or unspecified chronic kidney disease: Secondary | ICD-10-CM | POA: Diagnosis not present

## 2016-12-03 DIAGNOSIS — G9340 Encephalopathy, unspecified: Secondary | ICD-10-CM | POA: Diagnosis not present

## 2016-12-03 DIAGNOSIS — R339 Retention of urine, unspecified: Secondary | ICD-10-CM | POA: Diagnosis not present

## 2016-12-03 DIAGNOSIS — I2510 Atherosclerotic heart disease of native coronary artery without angina pectoris: Secondary | ICD-10-CM | POA: Diagnosis not present

## 2016-12-03 DIAGNOSIS — I5022 Chronic systolic (congestive) heart failure: Secondary | ICD-10-CM | POA: Diagnosis not present

## 2016-12-03 DIAGNOSIS — N183 Chronic kidney disease, stage 3 (moderate): Secondary | ICD-10-CM | POA: Diagnosis not present

## 2016-12-06 DIAGNOSIS — I130 Hypertensive heart and chronic kidney disease with heart failure and stage 1 through stage 4 chronic kidney disease, or unspecified chronic kidney disease: Secondary | ICD-10-CM | POA: Diagnosis not present

## 2016-12-06 DIAGNOSIS — I255 Ischemic cardiomyopathy: Secondary | ICD-10-CM | POA: Diagnosis not present

## 2016-12-06 DIAGNOSIS — I480 Paroxysmal atrial fibrillation: Secondary | ICD-10-CM | POA: Diagnosis not present

## 2016-12-06 DIAGNOSIS — R339 Retention of urine, unspecified: Secondary | ICD-10-CM | POA: Diagnosis not present

## 2016-12-06 DIAGNOSIS — F329 Major depressive disorder, single episode, unspecified: Secondary | ICD-10-CM | POA: Diagnosis not present

## 2016-12-06 DIAGNOSIS — S2241XD Multiple fractures of ribs, right side, subsequent encounter for fracture with routine healing: Secondary | ICD-10-CM | POA: Diagnosis not present

## 2016-12-06 DIAGNOSIS — I2510 Atherosclerotic heart disease of native coronary artery without angina pectoris: Secondary | ICD-10-CM | POA: Diagnosis not present

## 2016-12-06 DIAGNOSIS — G9340 Encephalopathy, unspecified: Secondary | ICD-10-CM | POA: Diagnosis not present

## 2016-12-06 DIAGNOSIS — Z95810 Presence of automatic (implantable) cardiac defibrillator: Secondary | ICD-10-CM | POA: Diagnosis not present

## 2016-12-06 DIAGNOSIS — I5022 Chronic systolic (congestive) heart failure: Secondary | ICD-10-CM | POA: Diagnosis not present

## 2016-12-06 DIAGNOSIS — N183 Chronic kidney disease, stage 3 (moderate): Secondary | ICD-10-CM | POA: Diagnosis not present

## 2016-12-07 ENCOUNTER — Telehealth: Payer: Self-pay | Admitting: Internal Medicine

## 2016-12-07 DIAGNOSIS — R339 Retention of urine, unspecified: Secondary | ICD-10-CM | POA: Diagnosis not present

## 2016-12-07 DIAGNOSIS — Z95810 Presence of automatic (implantable) cardiac defibrillator: Secondary | ICD-10-CM | POA: Diagnosis not present

## 2016-12-07 DIAGNOSIS — S2241XD Multiple fractures of ribs, right side, subsequent encounter for fracture with routine healing: Secondary | ICD-10-CM | POA: Diagnosis not present

## 2016-12-07 DIAGNOSIS — I2510 Atherosclerotic heart disease of native coronary artery without angina pectoris: Secondary | ICD-10-CM | POA: Diagnosis not present

## 2016-12-07 DIAGNOSIS — N183 Chronic kidney disease, stage 3 (moderate): Secondary | ICD-10-CM | POA: Diagnosis not present

## 2016-12-07 DIAGNOSIS — F329 Major depressive disorder, single episode, unspecified: Secondary | ICD-10-CM | POA: Diagnosis not present

## 2016-12-07 DIAGNOSIS — I130 Hypertensive heart and chronic kidney disease with heart failure and stage 1 through stage 4 chronic kidney disease, or unspecified chronic kidney disease: Secondary | ICD-10-CM | POA: Diagnosis not present

## 2016-12-07 DIAGNOSIS — G9340 Encephalopathy, unspecified: Secondary | ICD-10-CM | POA: Diagnosis not present

## 2016-12-07 DIAGNOSIS — I5022 Chronic systolic (congestive) heart failure: Secondary | ICD-10-CM | POA: Diagnosis not present

## 2016-12-07 DIAGNOSIS — I480 Paroxysmal atrial fibrillation: Secondary | ICD-10-CM | POA: Diagnosis not present

## 2016-12-07 DIAGNOSIS — I255 Ischemic cardiomyopathy: Secondary | ICD-10-CM | POA: Diagnosis not present

## 2016-12-07 NOTE — Telephone Encounter (Signed)
Agree 

## 2016-12-07 NOTE — Telephone Encounter (Signed)
error 

## 2016-12-07 NOTE — Telephone Encounter (Signed)
Spoke will Prescott from [facility] and gave verbals for UA and UC per MD.

## 2016-12-07 NOTE — Telephone Encounter (Signed)
States she is scheduled to see patient today.  Daughter states patient is having burning with urination.  Is requesting order for urinalysis.

## 2016-12-08 DIAGNOSIS — I2510 Atherosclerotic heart disease of native coronary artery without angina pectoris: Secondary | ICD-10-CM | POA: Diagnosis not present

## 2016-12-08 DIAGNOSIS — G9340 Encephalopathy, unspecified: Secondary | ICD-10-CM | POA: Diagnosis not present

## 2016-12-08 DIAGNOSIS — I130 Hypertensive heart and chronic kidney disease with heart failure and stage 1 through stage 4 chronic kidney disease, or unspecified chronic kidney disease: Secondary | ICD-10-CM | POA: Diagnosis not present

## 2016-12-08 DIAGNOSIS — R339 Retention of urine, unspecified: Secondary | ICD-10-CM | POA: Diagnosis not present

## 2016-12-08 DIAGNOSIS — N183 Chronic kidney disease, stage 3 (moderate): Secondary | ICD-10-CM | POA: Diagnosis not present

## 2016-12-08 DIAGNOSIS — I5022 Chronic systolic (congestive) heart failure: Secondary | ICD-10-CM | POA: Diagnosis not present

## 2016-12-09 DIAGNOSIS — N183 Chronic kidney disease, stage 3 (moderate): Secondary | ICD-10-CM | POA: Diagnosis not present

## 2016-12-09 DIAGNOSIS — R339 Retention of urine, unspecified: Secondary | ICD-10-CM | POA: Diagnosis not present

## 2016-12-09 DIAGNOSIS — G9340 Encephalopathy, unspecified: Secondary | ICD-10-CM | POA: Diagnosis not present

## 2016-12-09 DIAGNOSIS — I5022 Chronic systolic (congestive) heart failure: Secondary | ICD-10-CM | POA: Diagnosis not present

## 2016-12-09 DIAGNOSIS — I130 Hypertensive heart and chronic kidney disease with heart failure and stage 1 through stage 4 chronic kidney disease, or unspecified chronic kidney disease: Secondary | ICD-10-CM | POA: Diagnosis not present

## 2016-12-09 DIAGNOSIS — I2510 Atherosclerotic heart disease of native coronary artery without angina pectoris: Secondary | ICD-10-CM | POA: Diagnosis not present

## 2016-12-11 ENCOUNTER — Telehealth: Payer: Self-pay | Admitting: Internal Medicine

## 2016-12-11 MED ORDER — CIPROFLOXACIN HCL 250 MG PO TABS: 250.0000 mg | ORAL_TABLET | Freq: Two times a day (BID) | ORAL | 0 refills | Status: DC

## 2016-12-11 NOTE — Telephone Encounter (Signed)
Tried to call home/mobile number  - unable to get through.  Urine culture collected by home care shows infection.  We need to start an antibiotic - Cipro sent to pharmacy.

## 2016-12-13 NOTE — Telephone Encounter (Signed)
LVM with Daughter [name] informing that RX has been sent to [facility] for UTI. Also contacted JIll with [facility] to inform and she is also going to reach out to the pt.

## 2016-12-14 DIAGNOSIS — G9340 Encephalopathy, unspecified: Secondary | ICD-10-CM | POA: Diagnosis not present

## 2016-12-14 DIAGNOSIS — I130 Hypertensive heart and chronic kidney disease with heart failure and stage 1 through stage 4 chronic kidney disease, or unspecified chronic kidney disease: Secondary | ICD-10-CM | POA: Diagnosis not present

## 2016-12-14 DIAGNOSIS — I5022 Chronic systolic (congestive) heart failure: Secondary | ICD-10-CM | POA: Diagnosis not present

## 2016-12-14 DIAGNOSIS — R339 Retention of urine, unspecified: Secondary | ICD-10-CM | POA: Diagnosis not present

## 2016-12-14 DIAGNOSIS — N183 Chronic kidney disease, stage 3 (moderate): Secondary | ICD-10-CM | POA: Diagnosis not present

## 2016-12-14 DIAGNOSIS — I2510 Atherosclerotic heart disease of native coronary artery without angina pectoris: Secondary | ICD-10-CM | POA: Diagnosis not present

## 2016-12-15 ENCOUNTER — Other Ambulatory Visit: Payer: Self-pay | Admitting: Internal Medicine

## 2016-12-16 DIAGNOSIS — N183 Chronic kidney disease, stage 3 (moderate): Secondary | ICD-10-CM | POA: Diagnosis not present

## 2016-12-16 DIAGNOSIS — I5022 Chronic systolic (congestive) heart failure: Secondary | ICD-10-CM | POA: Diagnosis not present

## 2016-12-16 DIAGNOSIS — G9340 Encephalopathy, unspecified: Secondary | ICD-10-CM | POA: Diagnosis not present

## 2016-12-16 DIAGNOSIS — I130 Hypertensive heart and chronic kidney disease with heart failure and stage 1 through stage 4 chronic kidney disease, or unspecified chronic kidney disease: Secondary | ICD-10-CM | POA: Diagnosis not present

## 2016-12-16 DIAGNOSIS — I2510 Atherosclerotic heart disease of native coronary artery without angina pectoris: Secondary | ICD-10-CM | POA: Diagnosis not present

## 2016-12-16 DIAGNOSIS — R339 Retention of urine, unspecified: Secondary | ICD-10-CM | POA: Diagnosis not present

## 2016-12-16 NOTE — Telephone Encounter (Signed)
Put on it by cardiology for heart disease/ chest pain.  Ok to continue.  Falls not related to hypotension.  rx sent to pharmacy.

## 2016-12-16 NOTE — Telephone Encounter (Signed)
Routing to dr burns, with patient having fall/fractures in may/2018---and rx side effect can be dizziness and hypotension---are you ok with refilling this med or do you need a office visit--please advise, thanks

## 2016-12-21 ENCOUNTER — Encounter: Payer: Self-pay | Admitting: Internal Medicine

## 2016-12-21 ENCOUNTER — Telehealth: Payer: Self-pay | Admitting: Internal Medicine

## 2016-12-21 DIAGNOSIS — I2510 Atherosclerotic heart disease of native coronary artery without angina pectoris: Secondary | ICD-10-CM | POA: Diagnosis not present

## 2016-12-21 DIAGNOSIS — I5022 Chronic systolic (congestive) heart failure: Secondary | ICD-10-CM | POA: Diagnosis not present

## 2016-12-21 DIAGNOSIS — I130 Hypertensive heart and chronic kidney disease with heart failure and stage 1 through stage 4 chronic kidney disease, or unspecified chronic kidney disease: Secondary | ICD-10-CM | POA: Diagnosis not present

## 2016-12-21 DIAGNOSIS — R339 Retention of urine, unspecified: Secondary | ICD-10-CM | POA: Diagnosis not present

## 2016-12-21 DIAGNOSIS — N183 Chronic kidney disease, stage 3 (moderate): Secondary | ICD-10-CM | POA: Diagnosis not present

## 2016-12-21 DIAGNOSIS — G9340 Encephalopathy, unspecified: Secondary | ICD-10-CM | POA: Diagnosis not present

## 2016-12-21 LAB — URINALYSIS W MICROSCOPIC (NOT AT ARMC)
Bilirubin (Urine): NEGATIVE
Casts: NONE SEEN
Glucose, Ur: NEGATIVE
KETONES, URINE: NEGATIVE
NITRITE: NEGATIVE
Occult Blood: NEGATIVE
PH: 5
SPECIFIC GRAVITY: 1.022
UUROB: 0.2
WBC, UA: >30

## 2016-12-22 DIAGNOSIS — R339 Retention of urine, unspecified: Secondary | ICD-10-CM | POA: Diagnosis not present

## 2016-12-22 DIAGNOSIS — I5022 Chronic systolic (congestive) heart failure: Secondary | ICD-10-CM | POA: Diagnosis not present

## 2016-12-22 DIAGNOSIS — I2510 Atherosclerotic heart disease of native coronary artery without angina pectoris: Secondary | ICD-10-CM | POA: Diagnosis not present

## 2016-12-22 DIAGNOSIS — G9340 Encephalopathy, unspecified: Secondary | ICD-10-CM | POA: Diagnosis not present

## 2016-12-22 DIAGNOSIS — I130 Hypertensive heart and chronic kidney disease with heart failure and stage 1 through stage 4 chronic kidney disease, or unspecified chronic kidney disease: Secondary | ICD-10-CM | POA: Diagnosis not present

## 2016-12-22 DIAGNOSIS — N183 Chronic kidney disease, stage 3 (moderate): Secondary | ICD-10-CM | POA: Diagnosis not present

## 2016-12-28 ENCOUNTER — Telehealth: Payer: Self-pay | Admitting: Internal Medicine

## 2016-12-28 NOTE — Telephone Encounter (Signed)
New message      Pt complain of pain on his defibulator side.  Daughter said it was not "chest pain" but pain in the muscle around his defibulator

## 2016-12-28 NOTE — Telephone Encounter (Signed)
Spoke with patient's daughter who reports that patient c/o pain at defib site beginning roughly 1 month ago. Patient's daughter reports that the incision is free of redness, swelling or drainage. Patient's daughter states that it is not hot to the touch and he does not have a fever or chills. Daughter states that patient has chronic pain as well as a history of arthritis. She states that he gets injections for pain in the shoulder and strongly feels that this is related but she would like to be seen in the device clinic for patients peace of mind. Appointment made for 7/18 at 2pm. Patient's daughter agreeable to this.

## 2016-12-29 ENCOUNTER — Ambulatory Visit (INDEPENDENT_AMBULATORY_CARE_PROVIDER_SITE_OTHER): Payer: Medicare Other | Admitting: *Deleted

## 2016-12-29 VITALS — BP 134/75 | HR 110 | Temp 97.7°F

## 2016-12-29 DIAGNOSIS — Z95810 Presence of automatic (implantable) cardiac defibrillator: Secondary | ICD-10-CM | POA: Diagnosis not present

## 2016-12-29 DIAGNOSIS — I472 Ventricular tachycardia, unspecified: Secondary | ICD-10-CM

## 2016-12-29 LAB — CUP PACEART INCLINIC DEVICE CHECK
Brady Statistic RV Percent Paced: 1 % — CL
HighPow Impedance: 43 Ohm
Implantable Lead Implant Date: 20080716
Implantable Lead Model: 158
Implantable Lead Serial Number: 174941
Lead Channel Impedance Value: 529 Ohm
Lead Channel Pacing Threshold Amplitude: 0.7 V
Lead Channel Pacing Threshold Pulse Width: 0.4 ms
Lead Channel Setting Sensing Sensitivity: 0.6 mV
MDC IDC LEAD IMPLANT DT: 20080716
MDC IDC LEAD LOCATION: 753859
MDC IDC LEAD LOCATION: 753860
MDC IDC LEAD SERIAL: 281722
MDC IDC MSMT LEADCHNL RV IMPEDANCE VALUE: 677 Ohm
MDC IDC MSMT LEADCHNL RV SENSING INTR AMPL: 8.8 mV
MDC IDC PG IMPLANT DT: 20170530
MDC IDC PG SERIAL: 206833
MDC IDC SESS DTM: 20180718040000
MDC IDC SET LEADCHNL RV PACING AMPLITUDE: 2.5 V
MDC IDC SET LEADCHNL RV PACING PULSEWIDTH: 0.4 ms

## 2016-12-29 NOTE — Progress Notes (Signed)
ICD check in clinic, added-on for muscle pain near site when raising L arm. No drainage, redness, swelling, fever, or chills noted. Pt denies chest pain, states L shoulder and area "under" ICD are uncomfortable with movement. He is unable to describe pain further, but reports it is ongoing for about a month. Per phone note from 7/17, pt's daughter reports pt has chronic pain and arthritis in L shoulder. Site appears well healed.   Normal device function. Thresholds and sensing consistent with previous device measurements. Impedance trends stable over time. Hx AF, +Pradaxa. 1 nonsustained episode--11bts NSVT. Histogram distribution appropriate for patient and consistent with previous trends. Heart rate variability trend indicates R-shift since 05/2016; patient denies symptoms of ShOB, palpitations, or LE edema, but is unsure if he is taking all of his medications. No changes made this session. Device programmed at appropriate safety margins. Device programmed to optimize intrinsic conduction. Estimated longevity 10.5 years. Pt enrolled in remote follow-up. Pt education completed including shock plan. ROV with [facility] on 01/13/17 for overdue f/u appt. Encouraged pt and son to bring all medications with him to this appt and to f/u with PCP regarding L chest/shoulder discomfort with movement. They verbalize agreement with plan.

## 2017-01-03 ENCOUNTER — Ambulatory Visit (INDEPENDENT_AMBULATORY_CARE_PROVIDER_SITE_OTHER): Payer: Medicare Other | Admitting: Family

## 2017-01-03 ENCOUNTER — Encounter: Payer: Self-pay | Admitting: Family

## 2017-01-03 ENCOUNTER — Other Ambulatory Visit: Payer: Medicare Other

## 2017-01-03 VITALS — BP 148/82 | HR 113 | Temp 97.9°F | Resp 16 | Ht 68.0 in | Wt 156.0 lb

## 2017-01-03 DIAGNOSIS — R8299 Other abnormal findings in urine: Secondary | ICD-10-CM | POA: Diagnosis not present

## 2017-01-03 DIAGNOSIS — M25512 Pain in left shoulder: Secondary | ICD-10-CM | POA: Diagnosis not present

## 2017-01-03 DIAGNOSIS — I255 Ischemic cardiomyopathy: Secondary | ICD-10-CM | POA: Diagnosis not present

## 2017-01-03 DIAGNOSIS — R300 Dysuria: Secondary | ICD-10-CM

## 2017-01-03 DIAGNOSIS — R82998 Other abnormal findings in urine: Secondary | ICD-10-CM | POA: Insufficient documentation

## 2017-01-03 LAB — POCT URINALYSIS DIPSTICK
BILIRUBIN UA: NEGATIVE
GLUCOSE UA: NEGATIVE
Ketones, UA: NEGATIVE
Leukocytes, UA: NEGATIVE
NITRITE UA: NEGATIVE
Spec Grav, UA: >=1.03 — AB (ref 1.010–1.025)
UROBILINOGEN UA: NEGATIVE U/dL — AB
pH, UA: 6 (ref 5.0–8.0)

## 2017-01-03 MED ORDER — HYDROCORTISONE 2.5 % EX CREA: TOPICAL_CREAM | Freq: Two times a day (BID) | CUTANEOUS | 0 refills | Status: DC

## 2017-01-03 NOTE — Assessment & Plan Note (Signed)
No onset shoulder pain with exam consistent with biceps tendinitis and possible cysts subacromial bursitis/throat 2 to Tendinitis. Treat conservatively with ice, home exercise therapy, and Tylenol as needed for discomfort. Refer to orthopedics per patient request.

## 2017-01-03 NOTE — Progress Notes (Signed)
Subjective:    Patient ID: Jacob [name], male    DOB: Dec 16, 1931, 83 y.o.   MRN: [id number]  Chief Complaint  Patient presents with  . Dark urine    x1 month has had dark urine, some dysuria, and left shoulder pain    HPI:  Jacob [name] is a 83 y.o. male who  has a past medical history of AICD (automatic cardioverter/defibrillator) present; Arthritis; Atrial fibrillation ([place]); Benign prostatic hypertrophy; BPH (benign prostatic hyperplasia); CHF (congestive heart failure) ([place]); Chronic systolic heart failure ([place]); Coronary artery disease; Depression; Esophageal stricture; GERD (gastroesophageal reflux disease); Hemorrhoids; History of diverticulitis of colon; History of kidney stones; History of nephrolithiasis; HLD (hyperlipidemia); HTN (hypertension); Ischemic cardiomyopathy; Myocardial infarction [facility]); Pneumonia; Renal insufficiency; Skin cancer; Stroke [facility]); TIA (transient ischemic attack) (10/2011); and Urinary retention. and presents today for an acute office visit.    1.) Dark urine - This is a new problem. Associated symptom of dark urine has been going on for about 1 month. Endorses some burning with urination that has improved. Does have some increased frequency. Describes having increased urination from his previous UTI.  No fevers or chills. Drinks about 2-3 bottle of water and some caffiene.  2.) Shoulder pain - This is a new problem. Associated symptom of pain located in his left shoulder has been going on for 2-3 years and exacerbated during his most recent fall. Pain is described as achy. Does have decreased strength. Previously worked as a Software engineer. Severity of the pain does wake him up at night.    Allergies  Allergen Reactions  . Bactrim [Sulfamethoxazole-Trimethoprim] Other (See Comments)    Throat swelling  . Penicillins Itching, Swelling and Rash    Has patient had a PCN reaction causing immediate rash, facial/tongue/throat swelling, SOB or lightheadedness with  hypotension:unsure Has patient had a PCN reaction causing severe rash involving mucus membranes or skin necrosis:No Has patient had a PCN reaction that required hospitalization:No Has patient had a PCN reaction occurring within the last 10 years:No If all of the above answers are "NO", then may proceed with Cephalosporin use.       Outpatient Medications Prior to Visit  Medication Sig Dispense Refill  . dabigatran (PRADAXA) 150 MG CAPS capsule Take 1 capsule (150 mg total) by mouth 2 (two) times daily. 180 capsule 1  . DULoxetine (CYMBALTA) 30 MG capsule Take 1 capsule (30 mg total) by mouth daily. 30 capsule 0  . feeding supplement, ENSURE ENLIVE, (ENSURE ENLIVE) LIQD Take 237 mLs by mouth 2 (two) times daily between meals. 237 mL 12  . finasteride (PROSCAR) 5 MG tablet TAKE 1 TABLET(5 MG) BY MOUTH DAILY (Patient taking differently: TAKE 1 TABLET(5 MG) BY MOUTH DAILY WITH LUNCH) 90 tablet 3  . fluticasone (FLONASE) 50 MCG/ACT nasal spray SHAKE LIQUID AND USE 2 SPRAYS IN EACH NOSTRIL DAILY AS NEEDED FOR ALLERGIES OR RHINITIS 16 g 0  . furosemide (LASIX) 40 MG tablet TAKE 1 TABLET BY MOUTH DAILY (Patient taking differently: TAKE 1 TABLET BY MOUTH DAILY IN THE MORNING) 30 tablet 8  . HYDROcodone-acetaminophen (NORCO/VICODIN) 5-325 MG tablet Take 1 tablet by mouth every 6 (six) hours as needed for severe pain. 15 tablet 0  . isosorbide mononitrate (IMDUR) 30 MG 24 hr tablet TAKE 1 TABLET(30 MG) BY MOUTH DAILY 90 tablet 0  . metoprolol tartrate (LOPRESSOR) 25 MG tablet TAKE 1 TABLET BY MOUTH TWICE DAILY 60 tablet 4  . Multiple Vitamin (MULTIVITAMIN WITH MINERALS) TABS tablet Take 1  tablet by mouth daily. 30 tablet 0  . nitroGLYCERIN (NITROSTAT) 0.4 MG SL tablet PLACE 1 TABLET UNDER THE TONGUE EVERY 5 MINUTES FOR 3 DOSES AS NEEDED FOR CHEST PAIN 275 tablet 0  . pantoprazole (PROTONIX) 40 MG tablet TAKE 1 TABLET BY MOUTH EVERY DAY 30 tablet 1  . QUEtiapine (SEROQUEL) 25 MG tablet Take 1 tablet (25  mg total) by mouth at bedtime. 15 tablet 0  . ranitidine (ZANTAC) 300 MG tablet TAKE 1 TABLET(300 MG) BY MOUTH AT BEDTIME 90 tablet 3  . rosuvastatin (CRESTOR) 10 MG tablet TAKE 1 TABLET BY MOUTH AT BEDTIME 30 tablet 8  . sucralfate (CARAFATE) 1 GM/10ML suspension Take 10 mLs (1 g total) by mouth 4 (four) times daily -  with meals and at bedtime. (Patient taking differently: Take 1 g by mouth 4 (four) times daily as needed (for stomach issues.). ) 420 mL 0  . ciprofloxacin (CIPRO) 250 MG tablet Take 1 tablet (250 mg total) by mouth 2 (two) times daily. 14 tablet 0   No facility-administered medications prior to visit.       Past Surgical History:  Procedure Laterality Date  . CARDIAC CATHETERIZATION  11-05-10  . CARDIAC CATHETERIZATION N/A 10/14/2015   Procedure: Right/Left Heart Cath and Coronary Angiography;  Surgeon: Leonie Man, MD;  Location: [facility];  Service: Cardiovascular;  Laterality: N/A;  . CARDIAC DEFIBRILLATOR PLACEMENT  12/2006   AICD  . CATARACTS REMOVED    . EP IMPLANTABLE DEVICE N/A 11/11/2015   Procedure:  ICD Generator Changeout;  Surgeon: Evans Lance, MD;  Location: [facility];  Service: Cardiovascular;  Laterality: N/A;  . ESOPHAGOGASTRODUODENOSCOPY N/A 01/24/2014   Procedure: ESOPHAGOGASTRODUODENOSCOPY (EGD);  Surgeon: Milus Banister, MD;  Location: [facility];  Service: Endoscopy;  Laterality: N/A;  . ESOPHAGOGASTRODUODENOSCOPY N/A 02/14/2016   Procedure: ESOPHAGOGASTRODUODENOSCOPY (EGD);  Surgeon: Mauri Pole, MD;  Location: [facility];  Service: Endoscopy;  Laterality: N/A;  . JOINT REPLACEMENT    . TOTAL KNEE ARTHROPLASTY  01/04/11   L TKA  . TRANSURETHRAL RESECTION OF PROSTATE  06/14/2011   Procedure: TRANSURETHRAL RESECTION OF THE PROSTATE (TURP);  Surgeon: Bernestine Amass, MD;  Location: [facility];  Service: Urology;  Laterality: N/A;      Past Medical History:  Diagnosis Date  . AICD (automatic cardioverter/defibrillator)  present   . Arthritis   . Atrial fibrillation ([place])   . Benign prostatic hypertrophy   . BPH (benign prostatic hyperplasia)   . CHF (congestive heart failure) ([place])   . Chronic systolic heart failure ([facility])    a. EF 25-30% in past; b.Echo 11/08/11: Normal LV wall thickness, EF 45-50%. c. Echo 04/17: EF 15-20%, diffuse akinesis, moderate MR, mod dilated LA, trivial PE   . Coronary artery disease    a. Chronically occ RCA. cardiac cath 10/2010: Mid LAD 20-30%, distal LAD 40%, mid D1 40%, mid circumflex 20-30%, distal RCA occluded with left to right collaterals. c. 10/2015: cath- relatively stable dz since 2012.    . Depression   . Esophageal stricture   . GERD (gastroesophageal reflux disease)   . Hemorrhoids   . History of diverticulitis of colon   . History of kidney stones   . History of nephrolithiasis   . HLD (hyperlipidemia)   . HTN (hypertension)   . Ischemic cardiomyopathy    a. s/p ICD 2008   . Myocardial infarction ([place])   . Pneumonia   . Renal insufficiency  Renal failure in July 2012 secondary to hypotension, ARB use, and   . Skin cancer    skin neck  . Stroke ([place])    tia's  . TIA (transient ischemic attack) 10/2011   carotids neg for ICA stenosis  . Urinary retention    foley indwelling 12/2010-04/2011      Review of Systems  Constitutional: Negative for chills and fever.  Respiratory: Negative for chest tightness and shortness of breath.   Genitourinary: Positive for frequency. Negative for discharge, dysuria, flank pain, hematuria, penile pain, penile swelling, scrotal swelling, testicular pain and urgency.  Musculoskeletal:       Positive for left shoulder pain.   Neurological: Negative for weakness and numbness.      Objective:    BP (!) 148/82 (BP Location: Right Arm, Patient Position: Sitting, Cuff Size: Normal)   Pulse (!) 113   Temp 97.9 F (36.6 C) (Oral)   Resp 16   Ht 5\' 8"  (1.727 m)   Wt 156 lb (70.8 kg)   SpO2 97%   BMI 23.72 kg/m    Nursing note and vital signs reviewed.  Physical Exam  Constitutional: He is oriented to person, place, and time. He appears well-developed and well-nourished. No distress.  Cardiovascular: Normal rate, regular rhythm, normal heart sounds and intact distal pulses.   Pulmonary/Chest: Effort normal and breath sounds normal.  Abdominal: There is no CVA tenderness.  Musculoskeletal:  Left shoulder - no obvious deformity, discoloration, or edema. Palpable tenderness of the biceps tendon and subacromial space. Range of motion limited to 120 of flexion and abduction. Strength is normal. Distal pulses and sensation are intact and appropriate. Negative Michel Bickers; negative Neer's impingement; negative empty can.  Neurological: He is alert and oriented to person, place, and time.  Skin: Skin is warm and dry.  Psychiatric: He has a normal mood and affect. His behavior is normal. Judgment and thought content normal.       Assessment & Plan:   Problem List Items Addressed This Visit      Other   Dark urine - Primary    In office urinalysis negative for leukocytes, nitrates, and hematuria. Specific gravity noted to be elevated consistent with dehydration. No evidence of infection. Urine will be sent for culture. Encourage fluid intake of non-caffeinated and nonalcoholic fluids. Follow-up and additional treatment pending urine culture results.      Relevant Orders   POCT urinalysis dipstick (Completed)   Urine Culture   Acute pain of left shoulder    No onset shoulder pain with exam consistent with biceps tendinitis and possible cysts subacromial bursitis/throat 2 to Tendinitis. Treat conservatively with ice, home exercise therapy, and Tylenol as needed for discomfort. Refer to orthopedics per patient request.      Relevant Orders   AMB referral to orthopedics       I have discontinued Mr. [place]oline's ciprofloxacin. I am also having him start on hydrocortisone. Additionally, I am having him  maintain his dabigatran, sucralfate, nitroGLYCERIN, ranitidine, finasteride, furosemide, rosuvastatin, metoprolol tartrate, fluticasone, pantoprazole, HYDROcodone-acetaminophen, DULoxetine, feeding supplement (ENSURE ENLIVE), multivitamin with minerals, QUEtiapine, and isosorbide mononitrate.   Meds ordered this encounter  Medications  . hydrocortisone 2.5 % cream    Sig: Apply topically 2 (two) times daily.    Dispense:  30 g    Refill:  0    Order Specific Question:   Supervising Provider    Answer:   Pricilla Holm A [[id number]]     Follow-up: Return if symptoms  worsen or fail to improve.  Mauricio Po, FNP

## 2017-01-03 NOTE — Patient Instructions (Addendum)
Thank you for choosing [facility].  SUMMARY AND INSTRUCTIONS:  There is no evidence of infection in your urine.   We will check your urine culture.  For you shoulder:  Ice x 20 minutes every 2 hours and as needed.  Stretches and activities.   They will call to schedule your appointment with orthopedics.  Try hydrocortisone cream for the itchiness.   Medication:  Your prescription(s) have been submitted to your pharmacy or been printed and provided for you. Please take as directed and contact our office if you believe you are having problem(s) with the medication(s) or have any questions.   Follow up:  If your symptoms worsen or fail to improve, please contact our office for further instruction, or in case of emergency go directly to the emergency room at the closest medical facility.

## 2017-01-03 NOTE — Assessment & Plan Note (Signed)
In office urinalysis negative for leukocytes, nitrates, and hematuria. Specific gravity noted to be elevated consistent with dehydration. No evidence of infection. Urine will be sent for culture. Encourage fluid intake of non-caffeinated and nonalcoholic fluids. Follow-up and additional treatment pending urine culture results.

## 2017-01-05 LAB — URINE CULTURE

## 2017-01-06 DIAGNOSIS — R339 Retention of urine, unspecified: Secondary | ICD-10-CM | POA: Diagnosis not present

## 2017-01-06 DIAGNOSIS — N183 Chronic kidney disease, stage 3 (moderate): Secondary | ICD-10-CM | POA: Diagnosis not present

## 2017-01-06 DIAGNOSIS — G9340 Encephalopathy, unspecified: Secondary | ICD-10-CM | POA: Diagnosis not present

## 2017-01-06 DIAGNOSIS — I130 Hypertensive heart and chronic kidney disease with heart failure and stage 1 through stage 4 chronic kidney disease, or unspecified chronic kidney disease: Secondary | ICD-10-CM | POA: Diagnosis not present

## 2017-01-06 DIAGNOSIS — I2510 Atherosclerotic heart disease of native coronary artery without angina pectoris: Secondary | ICD-10-CM | POA: Diagnosis not present

## 2017-01-06 DIAGNOSIS — I5022 Chronic systolic (congestive) heart failure: Secondary | ICD-10-CM | POA: Diagnosis not present

## 2017-01-07 ENCOUNTER — Telehealth: Payer: Self-pay | Admitting: Internal Medicine

## 2017-01-07 NOTE — Telephone Encounter (Signed)
Chart reviewed and appropriate for OT.

## 2017-01-07 NOTE — Telephone Encounter (Signed)
Jacob [name] from [place] called stating that the pt was previously put on hold for OT but she would like for him to start back and is needing verbal orders to be able to do so. She can be reached at [phone].

## 2017-01-07 NOTE — Telephone Encounter (Signed)
Notified Jacob [name] w/Jacob [name] response.Marland KitchenJohny [name]

## 2017-01-07 NOTE — Telephone Encounter (Signed)
MD is out of office pls advise on msg below.../lmb 

## 2017-01-10 DIAGNOSIS — I2510 Atherosclerotic heart disease of native coronary artery without angina pectoris: Secondary | ICD-10-CM | POA: Diagnosis not present

## 2017-01-10 DIAGNOSIS — I130 Hypertensive heart and chronic kidney disease with heart failure and stage 1 through stage 4 chronic kidney disease, or unspecified chronic kidney disease: Secondary | ICD-10-CM | POA: Diagnosis not present

## 2017-01-10 DIAGNOSIS — I5022 Chronic systolic (congestive) heart failure: Secondary | ICD-10-CM | POA: Diagnosis not present

## 2017-01-10 DIAGNOSIS — N183 Chronic kidney disease, stage 3 (moderate): Secondary | ICD-10-CM | POA: Diagnosis not present

## 2017-01-10 DIAGNOSIS — G9340 Encephalopathy, unspecified: Secondary | ICD-10-CM | POA: Diagnosis not present

## 2017-01-10 DIAGNOSIS — R339 Retention of urine, unspecified: Secondary | ICD-10-CM | POA: Diagnosis not present

## 2017-01-11 DIAGNOSIS — N183 Chronic kidney disease, stage 3 (moderate): Secondary | ICD-10-CM | POA: Diagnosis not present

## 2017-01-11 DIAGNOSIS — I130 Hypertensive heart and chronic kidney disease with heart failure and stage 1 through stage 4 chronic kidney disease, or unspecified chronic kidney disease: Secondary | ICD-10-CM | POA: Diagnosis not present

## 2017-01-11 DIAGNOSIS — I2510 Atherosclerotic heart disease of native coronary artery without angina pectoris: Secondary | ICD-10-CM | POA: Diagnosis not present

## 2017-01-11 DIAGNOSIS — R339 Retention of urine, unspecified: Secondary | ICD-10-CM | POA: Diagnosis not present

## 2017-01-11 DIAGNOSIS — G9340 Encephalopathy, unspecified: Secondary | ICD-10-CM | POA: Diagnosis not present

## 2017-01-11 DIAGNOSIS — I5022 Chronic systolic (congestive) heart failure: Secondary | ICD-10-CM | POA: Diagnosis not present

## 2017-01-13 ENCOUNTER — Encounter: Payer: Self-pay | Admitting: Internal Medicine

## 2017-01-13 DIAGNOSIS — M25512 Pain in left shoulder: Secondary | ICD-10-CM | POA: Diagnosis not present

## 2017-01-14 DIAGNOSIS — N183 Chronic kidney disease, stage 3 (moderate): Secondary | ICD-10-CM | POA: Diagnosis not present

## 2017-01-14 DIAGNOSIS — R339 Retention of urine, unspecified: Secondary | ICD-10-CM | POA: Diagnosis not present

## 2017-01-14 DIAGNOSIS — G9340 Encephalopathy, unspecified: Secondary | ICD-10-CM | POA: Diagnosis not present

## 2017-01-14 DIAGNOSIS — I2510 Atherosclerotic heart disease of native coronary artery without angina pectoris: Secondary | ICD-10-CM | POA: Diagnosis not present

## 2017-01-14 DIAGNOSIS — I5022 Chronic systolic (congestive) heart failure: Secondary | ICD-10-CM | POA: Diagnosis not present

## 2017-01-14 DIAGNOSIS — I130 Hypertensive heart and chronic kidney disease with heart failure and stage 1 through stage 4 chronic kidney disease, or unspecified chronic kidney disease: Secondary | ICD-10-CM | POA: Diagnosis not present

## 2017-02-07 ENCOUNTER — Other Ambulatory Visit: Payer: Self-pay | Admitting: Internal Medicine

## 2017-02-22 ENCOUNTER — Ambulatory Visit (INDEPENDENT_AMBULATORY_CARE_PROVIDER_SITE_OTHER): Payer: Medicare Other | Admitting: Internal Medicine

## 2017-02-22 ENCOUNTER — Other Ambulatory Visit: Payer: Self-pay | Admitting: Internal Medicine

## 2017-02-22 ENCOUNTER — Encounter: Payer: Self-pay | Admitting: Internal Medicine

## 2017-02-22 ENCOUNTER — Ambulatory Visit (INDEPENDENT_AMBULATORY_CARE_PROVIDER_SITE_OTHER)
Admission: RE | Admit: 2017-02-22 | Discharge: 2017-02-22 | Disposition: A | Discharge status: Home / Self Care | Payer: Medicare Other | Source: Ambulatory Visit | Attending: Internal Medicine | Admitting: Internal Medicine

## 2017-02-22 ENCOUNTER — Other Ambulatory Visit (INDEPENDENT_AMBULATORY_CARE_PROVIDER_SITE_OTHER): Payer: Medicare Other

## 2017-02-22 VITALS — BP 130/74 | HR 115 | Temp 97.6°F | Resp 16 | Wt 141.0 lb

## 2017-02-22 DIAGNOSIS — R059 Cough, unspecified: Secondary | ICD-10-CM

## 2017-02-22 DIAGNOSIS — M25551 Pain in right hip: Secondary | ICD-10-CM

## 2017-02-22 DIAGNOSIS — R634 Abnormal weight loss: Secondary | ICD-10-CM | POA: Diagnosis not present

## 2017-02-22 DIAGNOSIS — R05 Cough: Secondary | ICD-10-CM

## 2017-02-22 DIAGNOSIS — F3289 Other specified depressive episodes: Secondary | ICD-10-CM | POA: Diagnosis not present

## 2017-02-22 DIAGNOSIS — D649 Anemia, unspecified: Secondary | ICD-10-CM

## 2017-02-22 DIAGNOSIS — R079 Chest pain, unspecified: Secondary | ICD-10-CM | POA: Diagnosis not present

## 2017-02-22 DIAGNOSIS — I255 Ischemic cardiomyopathy: Secondary | ICD-10-CM

## 2017-02-22 LAB — CBC WITH DIFFERENTIAL/PLATELET
BASOS ABS: 0.1 10*3/uL (ref 0.0–0.1)
BASOS PCT: 0.8 % (ref 0.0–3.0)
EOS ABS: 0.1 10*3/uL (ref 0.0–0.7)
Eosinophils Relative: 0.8 % (ref 0.0–5.0)
HCT: 35.5 % — ABNORMAL LOW (ref 39.0–52.0)
Hemoglobin: 11.3 g/dL — ABNORMAL LOW (ref 13.0–17.0)
Lymphocytes Relative: 11.9 % — ABNORMAL LOW (ref 12.0–46.0)
Lymphs Abs: 0.8 10*3/uL (ref 0.7–4.0)
MCHC: 31.9 g/dL (ref 30.0–36.0)
MCV: 84.1 fl (ref 78.0–100.0)
MONO ABS: 0.6 10*3/uL (ref 0.1–1.0)
Monocytes Relative: 9.4 % (ref 3.0–12.0)
NEUTROS ABS: 5.1 10*3/uL (ref 1.4–7.7)
Neutrophils Relative %: 77.1 % — ABNORMAL HIGH (ref 43.0–77.0)
PLATELETS: 211 10*3/uL (ref 150.0–400.0)
RBC: 4.21 Mil/uL — ABNORMAL LOW (ref 4.22–5.81)
RDW: 16.9 % — AB (ref 11.5–15.5)
WBC: 6.6 10*3/uL (ref 4.0–10.5)

## 2017-02-22 LAB — COMPREHENSIVE METABOLIC PANEL
ALK PHOS: 225 U/L — AB (ref 39–117)
ALT: 6 U/L (ref 0–53)
AST: 13 U/L (ref 0–37)
Albumin: 3.2 g/dL — ABNORMAL LOW (ref 3.5–5.2)
BILIRUBIN TOTAL: 0.6 mg/dL (ref 0.2–1.2)
BUN: 17 mg/dL (ref 6–23)
CO2: 26 mEq/L (ref 19–32)
CREATININE: 1.26 mg/dL (ref 0.40–1.50)
Calcium: 9.1 mg/dL (ref 8.4–10.5)
Chloride: 104 mEq/L (ref 96–112)
GFR: 58.03 mL/min — ABNORMAL LOW (ref >60.00)
GLUCOSE: 131 mg/dL — AB (ref 70–99)
Potassium: 4.3 mEq/L (ref 3.5–5.1)
SODIUM: 139 meq/L (ref 135–145)
TOTAL PROTEIN: 7.1 g/dL (ref 6.0–8.3)

## 2017-02-22 LAB — FERRITIN: Ferritin: 73.9 ng/mL (ref 22.0–322.0)

## 2017-02-22 LAB — IRON: Iron: 27 ug/dL — ABNORMAL LOW (ref 42–165)

## 2017-02-22 LAB — TSH: TSH: 2.88 u[IU]/mL (ref 0.35–4.50)

## 2017-02-22 MED ORDER — DULOXETINE HCL 60 MG PO CPEP: 60.0000 mg | ORAL_CAPSULE | Freq: Every day | ORAL | 1 refills | Status: AC

## 2017-02-22 NOTE — Assessment & Plan Note (Signed)
Depression not ideally controlled Increase Cymbalta to 60 mg daily

## 2017-02-22 NOTE — Assessment & Plan Note (Signed)
Chronic without change Will check chest x-ray given weight loss

## 2017-02-22 NOTE — Assessment & Plan Note (Signed)
Mild On pradaxa Check CBC, iron, ferritin

## 2017-02-22 NOTE — Patient Instructions (Signed)
Have blood work and x-rays done .   Test(s) ordered today. Your results will be released to [place] (or called to you) after review, usually within 72hours after test completion. If any changes need to be made, you will be notified at that same time.  Medications reviewed and updated.  Changes include increasing cymbalta to 60 mg daily.   Your prescription(s) have been submitted to your pharmacy. Please take as directed and contact our office if you believe you are having problem(s) with the medication(s).

## 2017-02-22 NOTE — Assessment & Plan Note (Signed)
According to our scales he has lost 24 pounds in less than 4 months No drastic change in activity or the amount that he eats-he is sedentary and does not eat much, but again there has been no major changes No change in medication We'll check basic blood work including CBC, CMP, TSH Given significant weight loss out cancer-we will check chest x-ray and hip x-ray, but may need further evaluation He does have some depression that may be contributing to his decreased appetite-we will increase Cymbalta

## 2017-02-22 NOTE — Progress Notes (Signed)
Subjective:    Patient ID: Jacob [name], male    DOB: 25-Jun-1931, 83 y.o.   MRN: [id number]  HPI He is here for an acute visit.   Weight loss:   Weight loss over the past month.  He weighs himself at home a couple of times a week.  He is very sedentary and there has been no change.  He barely eats, but that has not changed - he has barely eaten for a long time.  There has been no major change in the past month.  He has a decreased appetite and once he eats he has feels full easy.  He sometimes chokes when he eats certain foods, but this is not new.  He denies changes in medications.  He feels more depressed.    Weights at our office over the past year: 02/22/17  Weight  156 10/20/16    Weight  164 11/04/16:  Weight 165 01/03/17:  Weight 156 02/22/17   Weight 141   Right hip: He has chronic pain in his right hip, but just over one week ago it hurt a lot.  The pain was severe, 10/10.  He was having difficulty walking.  No injuries prior the increase in pain.  Last fell 3 months ago - fell on his butt.  It was not witnesed.  He has upper back pain.  No lower back pain.  Cough, SOB:  He has chronic coughing and SOB that are not new.  His family feels the cough is from the sinus drainage.    Medications and allergies reviewed with patient and updated if appropriate.  Patient Active Problem List   Diagnosis Date Noted  . Dark urine 01/03/2017  . Acute pain of left shoulder 01/03/2017  . Encephalopathy 11/18/2016  . Dehydration   . Low vitamin B12 level   . Low folate   . Acute encephalopathy 11/16/2016  . Preoperative clearance 10/21/2016  . Anemia 02/23/2016  . Aspiration pneumonitis ([place]) 02/15/2016  . CKD (chronic kidney disease), stage III 02/15/2016  . Atrial fibrillation with RVR ([place])   . Cardiomyopathy, ischemic   . Chronic systolic CHF (congestive heart failure) ([place]) 10/11/2015  . Diplopia 10/11/2015  . Depression 07/07/2015  . Esophageal obstruction due to food impaction  01/24/2014  . Syncope 02/22/2013  . CAD (coronary artery disease) 02/22/2013  . GERD (gastroesophageal reflux disease) 02/22/2013  . OA (osteoarthritis) of knee 02/22/2013  . TIA (transient ischemic attack) 11/07/2011  . Urinary retention 04/13/2011  . NEVUS, ATYPICAL 08/26/2010  . ECZEMA 05/27/2010  . Atrial fibrillation ([place]) 04/29/2010  . ROTATOR CUFF INJURY, RIGHT SHOULDER 09/16/2009  . Essential hypertension 04/23/2009  . BENIGN PROSTATIC HYPERTROPHY, WITH OBSTRUCTION 04/23/2009  . ARTHRITIS 04/23/2009  . HYPERCHOLESTEROLEMIA, MIXED 04/22/2008  . Automatic implantable cardioverter-defibrillator in situ 04/22/2008    Current Outpatient Prescriptions on File Prior to Visit  Medication Sig Dispense Refill  . dabigatran (PRADAXA) 150 MG CAPS capsule Take 1 capsule (150 mg total) by mouth 2 (two) times daily. 180 capsule 1  . DULoxetine (CYMBALTA) 30 MG capsule Take 1 capsule (30 mg total) by mouth daily. 30 capsule 0  . feeding supplement, ENSURE ENLIVE, (ENSURE ENLIVE) LIQD Take 237 mLs by mouth 2 (two) times daily between meals. 237 mL 12  . finasteride (PROSCAR) 5 MG tablet TAKE 1 TABLET(5 MG) BY MOUTH DAILY (Patient taking differently: TAKE 1 TABLET(5 MG) BY MOUTH DAILY WITH LUNCH) 90 tablet 3  . fluticasone (FLONASE) 50 MCG/ACT nasal spray  SHAKE LIQUID AND USE 2 SPRAYS IN EACH NOSTRIL DAILY AS NEEDED FOR ALLERGIES OR RHINITIS 16 g 0  . furosemide (LASIX) 40 MG tablet TAKE 1 TABLET BY MOUTH DAILY (Patient taking differently: TAKE 1 TABLET BY MOUTH DAILY IN THE MORNING) 30 tablet 8  . HYDROcodone-acetaminophen (NORCO/VICODIN) 5-325 MG tablet Take 1 tablet by mouth every 6 (six) hours as needed for severe pain. 15 tablet 0  . hydrocortisone 2.5 % cream Apply topically 2 (two) times daily. 30 g 0  . isosorbide mononitrate (IMDUR) 30 MG 24 hr tablet TAKE 1 TABLET(30 MG) BY MOUTH DAILY 90 tablet 0  . metoprolol tartrate (LOPRESSOR) 25 MG tablet TAKE 1 TABLET BY MOUTH TWICE DAILY 60  tablet 4  . Multiple Vitamin (MULTIVITAMIN WITH MINERALS) TABS tablet Take 1 tablet by mouth daily. 30 tablet 0  . nitroGLYCERIN (NITROSTAT) 0.4 MG SL tablet PLACE 1 TABLET UNDER THE TONGUE EVERY 5 MINUTES FOR 3 DOSES AS NEEDED FOR CHEST PAIN 275 tablet 0  . omeprazole (PRILOSEC) 40 MG capsule TAKE 1 CAPSULE(40 MG) BY MOUTH DAILY 30 capsule 0  . pantoprazole (PROTONIX) 40 MG tablet TAKE 1 TABLET BY MOUTH EVERY DAY 30 tablet 1  . QUEtiapine (SEROQUEL) 25 MG tablet Take 1 tablet (25 mg total) by mouth at bedtime. 15 tablet 0  . ranitidine (ZANTAC) 300 MG tablet TAKE 1 TABLET(300 MG) BY MOUTH AT BEDTIME 90 tablet 3  . rosuvastatin (CRESTOR) 10 MG tablet TAKE 1 TABLET BY MOUTH AT BEDTIME 30 tablet 8  . sucralfate (CARAFATE) 1 GM/10ML suspension Take 10 mLs (1 g total) by mouth 4 (four) times daily -  with meals and at bedtime. (Patient taking differently: Take 1 g by mouth 4 (four) times daily as needed (for stomach issues.). ) 420 mL 0   No current facility-administered medications on file prior to visit.     Past Medical History:  Diagnosis Date  . AICD (automatic cardioverter/defibrillator) present   . Arthritis   . Atrial fibrillation ([place])   . Benign prostatic hypertrophy   . BPH (benign prostatic hyperplasia)   . CHF (congestive heart failure) ([place])   . Chronic systolic heart failure (HCC)    a. EF 25-30% in past; b.Echo 11/08/11: Normal LV wall thickness, EF 45-50%. c. Echo 04/17: EF 15-20%, diffuse akinesis, moderate MR, mod dilated LA, trivial PE   . Coronary artery disease    a. Chronically occ RCA. cardiac cath 10/2010: Mid LAD 20-30%, distal LAD 40%, mid D1 40%, mid circumflex 20-30%, distal RCA occluded with left to right collaterals. c. 10/2015: cath- relatively stable dz since 2012.    . Depression   . Esophageal stricture   . GERD (gastroesophageal reflux disease)   . Hemorrhoids   . History of diverticulitis of colon   . History of kidney stones   . History of  nephrolithiasis   . HLD (hyperlipidemia)   . HTN (hypertension)   . Ischemic cardiomyopathy    a. s/p ICD 2008   . Myocardial infarction ([place])   . Pneumonia   . Renal insufficiency    Renal failure in July 2012 secondary to hypotension, ARB use, and   . Skin cancer    skin neck  . Stroke ([place])    tia's  . TIA (transient ischemic attack) 10/2011   carotids neg for ICA stenosis  . Urinary retention    foley indwelling 12/2010-04/2011    Past Surgical History:  Procedure Laterality Date  . CARDIAC CATHETERIZATION  11-05-10  .  CARDIAC CATHETERIZATION N/A 10/14/2015   Procedure: Right/Left Heart Cath and Coronary Angiography;  Surgeon: Leonie Man, MD;  Location: [facility];  Service: Cardiovascular;  Laterality: N/A;  . CARDIAC DEFIBRILLATOR PLACEMENT  12/2006   AICD  . CATARACTS REMOVED    . EP IMPLANTABLE DEVICE N/A 11/11/2015   Procedure:  ICD Generator Changeout;  Surgeon: Evans Lance, MD;  Location: [facility];  Service: Cardiovascular;  Laterality: N/A;  . ESOPHAGOGASTRODUODENOSCOPY N/A 01/24/2014   Procedure: ESOPHAGOGASTRODUODENOSCOPY (EGD);  Surgeon: Milus Banister, MD;  Location: [facility];  Service: Endoscopy;  Laterality: N/A;  . ESOPHAGOGASTRODUODENOSCOPY N/A 02/14/2016   Procedure: ESOPHAGOGASTRODUODENOSCOPY (EGD);  Surgeon: Mauri Pole, MD;  Location: [facility];  Service: Endoscopy;  Laterality: N/A;  . JOINT REPLACEMENT    . TOTAL KNEE ARTHROPLASTY  01/04/11   L TKA  . TRANSURETHRAL RESECTION OF PROSTATE  06/14/2011   Procedure: TRANSURETHRAL RESECTION OF THE PROSTATE (TURP);  Surgeon: Bernestine Amass, MD;  Location: [facility];  Service: Urology;  Laterality: N/A;    Social History   Social History  . Marital status: Married    Spouse name: N/A  . Number of children: N/A  . Years of education: N/A   Occupational History  . retired    Social History Main Topics  . Smoking status: Former Smoker    Packs/day: 2.00    Years: 30.00     Quit date: 06/14/1968  . Smokeless tobacco: Never Used  . Alcohol use No  . Drug use: No  . Sexual activity: Not Currently   Other Topics Concern  . Not on file   Social History Narrative   Daily caffeine     Family History  Problem Relation Age of Onset  . Heart attack Mother 44       pt unsure of age  . Liver disease Father 44  . Heart disease Son        x 2  . Colon cancer Neg Hx     Review of Systems  Constitutional: Positive for appetite change and unexpected weight change. Negative for chills and fever.  Respiratory: Positive for cough (chronic, from sinuses) and shortness of breath (chronic, no change). Negative for wheezing.   Cardiovascular: Positive for chest pain (from defib and left shoulder) and palpitations. Negative for leg swelling.  Gastrointestinal: Positive for constipation (from pain meds, controlled with miralax). Negative for abdominal pain, blood in stool, diarrhea and nausea.  Genitourinary: Negative for dysuria and hematuria.  Musculoskeletal: Positive for arthralgias and back pain (upper back).  Neurological: Positive for dizziness and light-headedness. Negative for headaches.       Objective:   Vitals:   02/22/17 1409  BP: 130/74  Pulse: (!) 115  Resp: 16  Temp: 97.6 F (36.4 C)  SpO2: 98%   Filed Weights   02/22/17 1409  Weight: 141 lb (64 kg)   Body mass index is 21.44 kg/m.  Wt Readings from Last 3 Encounters:  02/22/17 141 lb (64 kg)  01/03/17 156 lb (70.8 kg)  11/19/16 156 lb 15.5 oz (71.2 kg)     Physical Exam  Constitutional: No distress.  Frail elderly male who appears chronically ill  HENT:  Head: Normocephalic and atraumatic.  Mouth/Throat: Oropharynx is clear and moist.  Eyes: Conjunctivae are normal.  Neck: Neck supple. No tracheal deviation present. No thyromegaly present.  Cardiovascular: Normal rate, regular rhythm and normal heart sounds.   No murmur heard. Pulmonary/Chest: Breath sounds normal.  No  respiratory distress. He has no wheezes. He has no rales.  Abdominal: Soft. He exhibits no distension. There is no tenderness.  Musculoskeletal: He exhibits no edema.  Lymphadenopathy:    He has no cervical adenopathy.  Skin: Skin is warm and dry. He is not diaphoretic.          Assessment & Plan:   See Problem List for Assessment and Plan of chronic medical problems.

## 2017-02-22 NOTE — Assessment & Plan Note (Signed)
Acute on chronic Given the severity of pain that occurred without injury we will go ahead and check an x-ray to rule out fracture, severe arthritis or possibly metastatic disease given weight loss May need to see orthopedics/sports medicine

## 2017-02-25 ENCOUNTER — Other Ambulatory Visit: Payer: Self-pay | Admitting: Internal Medicine

## 2017-03-22 ENCOUNTER — Encounter: Payer: Self-pay | Admitting: Internal Medicine

## 2017-03-23 ENCOUNTER — Encounter: Payer: Self-pay | Admitting: Internal Medicine

## 2017-03-23 ENCOUNTER — Telehealth: Payer: Self-pay | Admitting: Internal Medicine

## 2017-03-23 NOTE — Telephone Encounter (Signed)
Spoke with pts daughter and informed her that the transmission was received and there were no episodes, pts daughter stated that he might of just been dreaming.

## 2017-03-23 NOTE — Telephone Encounter (Signed)
New message   Pt daughter verbalized that his device went off   1. Has your device fired? yes  2. Is you device beeping? no  3. Are you experiencing draining or swelling at device site? no  4. Are you calling to see if we received your device transmission? no  5. Have you passed out? no    Please route to [place]

## 2017-03-23 NOTE — Telephone Encounter (Signed)
Spoke w/ pt daughter and she informed me that patient received therapy from his ICD. Instructed pt daughter to send a remote. Pt daughter stated that patient is fine just a little short of breath. Transmission received. Called routed to [place].

## 2017-04-04 ENCOUNTER — Other Ambulatory Visit (INDEPENDENT_AMBULATORY_CARE_PROVIDER_SITE_OTHER): Payer: Medicare Other

## 2017-04-04 ENCOUNTER — Encounter: Payer: Self-pay | Admitting: Internal Medicine

## 2017-04-04 ENCOUNTER — Ambulatory Visit (INDEPENDENT_AMBULATORY_CARE_PROVIDER_SITE_OTHER)
Admission: RE | Admit: 2017-04-04 | Discharge: 2017-04-04 | Disposition: A | Discharge status: Home / Self Care | Payer: Medicare Other | Source: Ambulatory Visit | Attending: Internal Medicine | Admitting: Internal Medicine

## 2017-04-04 ENCOUNTER — Ambulatory Visit (INDEPENDENT_AMBULATORY_CARE_PROVIDER_SITE_OTHER): Payer: Medicare Other | Admitting: Internal Medicine

## 2017-04-04 VITALS — BP 134/82 | HR 106 | Temp 97.7°F | Resp 18 | Wt 136.0 lb

## 2017-04-04 DIAGNOSIS — R059 Cough, unspecified: Secondary | ICD-10-CM

## 2017-04-04 DIAGNOSIS — R05 Cough: Secondary | ICD-10-CM

## 2017-04-04 DIAGNOSIS — I255 Ischemic cardiomyopathy: Secondary | ICD-10-CM | POA: Diagnosis not present

## 2017-04-04 DIAGNOSIS — E440 Moderate protein-calorie malnutrition: Secondary | ICD-10-CM

## 2017-04-04 DIAGNOSIS — R0600 Dyspnea, unspecified: Secondary | ICD-10-CM | POA: Insufficient documentation

## 2017-04-04 DIAGNOSIS — R0602 Shortness of breath: Secondary | ICD-10-CM

## 2017-04-04 DIAGNOSIS — R140 Abdominal distension (gaseous): Secondary | ICD-10-CM

## 2017-04-04 DIAGNOSIS — E46 Unspecified protein-calorie malnutrition: Secondary | ICD-10-CM | POA: Insufficient documentation

## 2017-04-04 DIAGNOSIS — R634 Abnormal weight loss: Secondary | ICD-10-CM

## 2017-04-04 LAB — CBC WITH DIFFERENTIAL/PLATELET
BASOS ABS: 0.1 10*3/uL (ref 0.0–0.1)
Basophils Relative: 0.9 % (ref 0.0–3.0)
EOS ABS: 0 10*3/uL (ref 0.0–0.7)
Eosinophils Relative: 0.6 % (ref 0.0–5.0)
HCT: 35.4 % — ABNORMAL LOW (ref 39.0–52.0)
Hemoglobin: 11.3 g/dL — ABNORMAL LOW (ref 13.0–17.0)
LYMPHS ABS: 0.9 10*3/uL (ref 0.7–4.0)
Lymphocytes Relative: 14.6 % (ref 12.0–46.0)
MCHC: 32.1 g/dL (ref 30.0–36.0)
MCV: 84.6 fl (ref 78.0–100.0)
MONOS PCT: 9.3 % (ref 3.0–12.0)
Monocytes Absolute: 0.6 10*3/uL (ref 0.1–1.0)
NEUTROS ABS: 4.7 10*3/uL (ref 1.4–7.7)
NEUTROS PCT: 74.6 % (ref 43.0–77.0)
PLATELETS: 230 10*3/uL (ref 150.0–400.0)
RBC: 4.18 Mil/uL — ABNORMAL LOW (ref 4.22–5.81)
RDW: 18.6 % — ABNORMAL HIGH (ref 11.5–15.5)
WBC: 6.3 10*3/uL (ref 4.0–10.5)

## 2017-04-04 LAB — COMPREHENSIVE METABOLIC PANEL
ALT: 5 U/L (ref 0–53)
AST: 11 U/L (ref 0–37)
Albumin: 3.1 g/dL — ABNORMAL LOW (ref 3.5–5.2)
Alkaline Phosphatase: 397 U/L — ABNORMAL HIGH (ref 39–117)
BILIRUBIN TOTAL: 0.8 mg/dL (ref 0.2–1.2)
BUN: 18 mg/dL (ref 6–23)
CO2: 29 meq/L (ref 19–32)
Calcium: 9 mg/dL (ref 8.4–10.5)
Chloride: 106 mEq/L (ref 96–112)
Creatinine, Ser: 1.21 mg/dL (ref 0.40–1.50)
GFR: 60.79 mL/min (ref >60.00)
GLUCOSE: 136 mg/dL — AB (ref 70–99)
Potassium: 3.5 mEq/L (ref 3.5–5.1)
Sodium: 142 mEq/L (ref 135–145)
Total Protein: 7 g/dL (ref 6.0–8.3)

## 2017-04-04 LAB — TSH: TSH: 2.66 u[IU]/mL (ref 0.35–4.50)

## 2017-04-04 MED ORDER — DOXYCYCLINE HYCLATE 100 MG PO TABS: 100.0000 mg | ORAL_TABLET | Freq: Two times a day (BID) | ORAL | 0 refills | Status: DC

## 2017-04-04 NOTE — Patient Instructions (Signed)
Have a chest xray and blood work done today   Test(s) ordered today. Your results will be released to [place] (or called to you) after review, usually within 72hours after test completion. If any changes need to be made, you will be notified at that same time.   Medications reviewed and updated.  Changes include starting doxycycline    Your prescription(s) have been submitted to your pharmacy. Please take as directed and contact our office if you believe you are having problem(s) with the medication(s).

## 2017-04-04 NOTE — Assessment & Plan Note (Signed)
Clinically malnutrition-caloric and protein Intake is minimal and he is losing weight Restart Ensure Concern for possible cancer CBC, CMP, TSH CT of the abdomen and pelvis and CT of the chest Further evaluation to rule out cancer depending on above

## 2017-04-04 NOTE — Assessment & Plan Note (Signed)
Has abdominal distention, but no pain Bowel movements normal Given his weight loss and shortness of breath will order a CT of the abdomen and pelvis CBC, CMP, TSH Further evaluation depending on above

## 2017-04-04 NOTE — Assessment & Plan Note (Signed)
Acute on chronic with discolored phlegm Chest x-ray today Start empiric doxycycline for probable bronchitis or pneumonia CT of the chest given shortness of breath and weight loss

## 2017-04-04 NOTE — Assessment & Plan Note (Signed)
Weight loss over the past several months related to decreased appetite and oral intake Concern for cancer Stressed increasing his intake, was drinking Ensure previously and encouraged him to restart Will order CT of the abdomen and pelvis and CT of the chest CBC, CMP, TSH Depending on above results we'll evaluate further

## 2017-04-04 NOTE — Progress Notes (Signed)
Subjective:    Patient ID: Jacob [name], male    DOB: 1932/03/18, 83 y.o.   MRN: [id number]  HPI He is here for an acute visit for cold symptoms.  He is experiencing increased SOB, Increased cough that is productive of discolored phlegm, fatigue, subjective fevers, mild nasal congestion and wheezing. He does state some nausea and continued decreased appetite. He feels lightheaded at times. He denies any ear pain, sinus pain or pressure, sore throat and headaches.  Malnutrition, weight loss: He was here last month and if His weight loss. She continues to lose weight-he has lost 5 pounds in the past 5 weeks. He has a very low appetite and is not eating much, often only eating once a day. He is not drinking many fluids either. He is a poor historian, but denies any other concerning symptoms. He does have some back pain, but that is not new over the past year. He denies any abdominal pain, but states he does have some abdominal distention. His bowel movements are normal.  Medications and allergies reviewed with patient and updated if appropriate.  Patient Active Problem List   Diagnosis Date Noted  . Weight loss 02/22/2017  . Cough 02/22/2017  . Right hip pain 02/22/2017  . Dark urine 01/03/2017  . Acute pain of left shoulder 01/03/2017  . Encephalopathy 11/18/2016  . Dehydration   . Low vitamin B12 level   . Low folate   . Acute encephalopathy 11/16/2016  . Preoperative clearance 10/21/2016  . Anemia 02/23/2016  . Aspiration pneumonitis ([place]) 02/15/2016  . CKD (chronic kidney disease), stage III ([place]) 02/15/2016  . Atrial fibrillation with RVR ([place])   . Cardiomyopathy, ischemic   . Chronic systolic CHF (congestive heart failure) ([place]) 10/11/2015  . Diplopia 10/11/2015  . Depression 07/07/2015  . Esophageal obstruction due to food impaction 01/24/2014  . Syncope 02/22/2013  . CAD (coronary artery disease) 02/22/2013  . GERD (gastroesophageal reflux disease) 02/22/2013  . OA  (osteoarthritis) of knee 02/22/2013  . TIA (transient ischemic attack) 11/07/2011  . Urinary retention 04/13/2011  . NEVUS, ATYPICAL 08/26/2010  . ECZEMA 05/27/2010  . Atrial fibrillation ([place]) 04/29/2010  . ROTATOR CUFF INJURY, RIGHT SHOULDER 09/16/2009  . Essential hypertension 04/23/2009  . BENIGN PROSTATIC HYPERTROPHY, WITH OBSTRUCTION 04/23/2009  . ARTHRITIS 04/23/2009  . HYPERCHOLESTEROLEMIA, MIXED 04/22/2008  . Automatic implantable cardioverter-defibrillator in situ 04/22/2008    Current Outpatient Prescriptions on File Prior to Visit  Medication Sig Dispense Refill  . dabigatran (PRADAXA) 150 MG CAPS capsule Take 1 capsule (150 mg total) by mouth 2 (two) times daily. 180 capsule 1  . DULoxetine (CYMBALTA) 60 MG capsule Take 1 capsule (60 mg total) by mouth daily. 90 capsule 1  . feeding supplement, ENSURE ENLIVE, (ENSURE ENLIVE) LIQD Take 237 mLs by mouth 2 (two) times daily between meals. 237 mL 12  . finasteride (PROSCAR) 5 MG tablet TAKE 1 TABLET(5 MG) BY MOUTH DAILY (Patient taking differently: TAKE 1 TABLET(5 MG) BY MOUTH DAILY WITH LUNCH) 90 tablet 3  . fluticasone (FLONASE) 50 MCG/ACT nasal spray SHAKE LIQUID AND USE 2 SPRAYS IN EACH NOSTRIL DAILY AS NEEDED FOR ALLERGIES OR RHINITIS 16 g 0  . furosemide (LASIX) 40 MG tablet TAKE 1 TABLET BY MOUTH DAILY (Patient taking differently: TAKE 1 TABLET BY MOUTH DAILY IN THE MORNING) 30 tablet 8  . HYDROcodone-acetaminophen (NORCO/VICODIN) 5-325 MG tablet Take 1 tablet by mouth every 6 (six) hours as needed for severe pain. 15 tablet 0  .  hydrocortisone 2.5 % cream Apply topically 2 (two) times daily. 30 g 0  . isosorbide mononitrate (IMDUR) 30 MG 24 hr tablet TAKE 1 TABLET(30 MG) BY MOUTH DAILY 90 tablet 0  . metoprolol tartrate (LOPRESSOR) 25 MG tablet TAKE 1 TABLET BY MOUTH TWICE DAILY 60 tablet 4  . Multiple Vitamin (MULTIVITAMIN WITH MINERALS) TABS tablet Take 1 tablet by mouth daily. 30 tablet 0  . nitroGLYCERIN (NITROSTAT)  0.4 MG SL tablet PLACE 1 TABLET UNDER THE TONGUE EVERY 5 MINUTES FOR 3 DOSES AS NEEDED FOR CHEST PAIN 275 tablet 0  . omeprazole (PRILOSEC) 40 MG capsule TAKE 1 CAPSULE(40 MG) BY MOUTH DAILY 30 capsule 0  . QUEtiapine (SEROQUEL) 25 MG tablet Take 1 tablet (25 mg total) by mouth at bedtime. 15 tablet 0  . ranitidine (ZANTAC) 300 MG tablet TAKE 1 TABLET(300 MG) BY MOUTH AT BEDTIME 90 tablet 3  . rosuvastatin (CRESTOR) 10 MG tablet TAKE 1 TABLET BY MOUTH AT BEDTIME 30 tablet 8  . sucralfate (CARAFATE) 1 GM/10ML suspension Take 10 mLs (1 g total) by mouth 4 (four) times daily -  with meals and at bedtime. (Patient taking differently: Take 1 g by mouth 4 (four) times daily as needed (for stomach issues.). ) 420 mL 0   No current facility-administered medications on file prior to visit.     Past Medical History:  Diagnosis Date  . AICD (automatic cardioverter/defibrillator) present   . Arthritis   . Atrial fibrillation ([place])   . Benign prostatic hypertrophy   . BPH (benign prostatic hyperplasia)   . CHF (congestive heart failure) ([place])   . Chronic systolic heart failure (HCC)    a. EF 25-30% in past; b.Echo 11/08/11: Normal LV wall thickness, EF 45-50%. c. Echo 04/17: EF 15-20%, diffuse akinesis, moderate MR, mod dilated LA, trivial PE   . Coronary artery disease    a. Chronically occ RCA. cardiac cath 10/2010: Mid LAD 20-30%, distal LAD 40%, mid D1 40%, mid circumflex 20-30%, distal RCA occluded with left to right collaterals. c. 10/2015: cath- relatively stable dz since 2012.    . Depression   . Esophageal stricture   . GERD (gastroesophageal reflux disease)   . Hemorrhoids   . History of diverticulitis of colon   . History of kidney stones   . History of nephrolithiasis   . HLD (hyperlipidemia)   . HTN (hypertension)   . Ischemic cardiomyopathy    a. s/p ICD 2008   . Myocardial infarction ([place])   . Pneumonia   . Renal insufficiency    Renal failure in July 2012 secondary to  hypotension, ARB use, and   . Skin cancer    skin neck  . Stroke ([place])    tia's  . TIA (transient ischemic attack) 10/2011   carotids neg for ICA stenosis  . Urinary retention    foley indwelling 12/2010-04/2011    Past Surgical History:  Procedure Laterality Date  . CARDIAC CATHETERIZATION  11-05-10  . CARDIAC CATHETERIZATION N/A 10/14/2015   Procedure: Right/Left Heart Cath and Coronary Angiography;  Surgeon: Leonie Man, MD;  Location: [facility];  Service: Cardiovascular;  Laterality: N/A;  . CARDIAC DEFIBRILLATOR PLACEMENT  12/2006   AICD  . CATARACTS REMOVED    . EP IMPLANTABLE DEVICE N/A 11/11/2015   Procedure:  ICD Generator Changeout;  Surgeon: Evans Lance, MD;  Location: [facility];  Service: Cardiovascular;  Laterality: N/A;  . ESOPHAGOGASTRODUODENOSCOPY N/A 01/24/2014   Procedure: ESOPHAGOGASTRODUODENOSCOPY (EGD);  Surgeon: Milus Banister, MD;  Location: [facility];  Service: Endoscopy;  Laterality: N/A;  . ESOPHAGOGASTRODUODENOSCOPY N/A 02/14/2016   Procedure: ESOPHAGOGASTRODUODENOSCOPY (EGD);  Surgeon: Mauri Pole, MD;  Location: [facility];  Service: Endoscopy;  Laterality: N/A;  . JOINT REPLACEMENT    . TOTAL KNEE ARTHROPLASTY  01/04/11   L TKA  . TRANSURETHRAL RESECTION OF PROSTATE  06/14/2011   Procedure: TRANSURETHRAL RESECTION OF THE PROSTATE (TURP);  Surgeon: Bernestine Amass, MD;  Location: [facility];  Service: Urology;  Laterality: N/A;    Social History   Social History  . Marital status: Married    Spouse name: N/A  . Number of children: N/A  . Years of education: N/A   Occupational History  . retired    Social History Main Topics  . Smoking status: Former Smoker    Packs/day: 2.00    Years: 30.00    Quit date: 06/14/1968  . Smokeless tobacco: Never Used  . Alcohol use No  . Drug use: No  . Sexual activity: Not Currently   Other Topics Concern  . None   Social History Narrative   Daily caffeine     Family History    Problem Relation Age of Onset  . Heart attack Mother 81       pt unsure of age  . Liver disease Father 38  . Heart disease Son        x 2  . Colon cancer Neg Hx     Review of Systems  Constitutional: Positive for appetite change (decreased), fatigue and fever (subjective fever).  HENT: Positive for congestion (mild). Negative for ear pain, sinus pain, sinus pressure and sore throat.   Respiratory: Positive for cough, shortness of breath and wheezing.   Gastrointestinal: Positive for abdominal distention and nausea. Negative for abdominal pain, blood in stool, constipation and diarrhea.  Genitourinary: Negative for dysuria and hematuria.  Musculoskeletal: Positive for back pain (x 1 year).  Neurological: Positive for light-headedness. Negative for headaches.       Objective:   Vitals:   04/04/17 1611  BP: 134/82  Pulse: (!) 106  Resp: 18  Temp: 97.7 F (36.5 C)  SpO2: 96%   Filed Weights   04/04/17 1611  Weight: 136 lb (61.7 kg)   Body mass index is 20.68 kg/m.  Wt Readings from Last 3 Encounters:  04/04/17 136 lb (61.7 kg)  02/22/17 141 lb (64 kg)  01/03/17 156 lb (70.8 kg)     Physical Exam GENERAL APPEARANCE: Chronically ill appearing, NAD EYES: conjunctiva clear, no icterus HEENT: bilateral tympanic membranes and ear canals normal, oropharynx with mild erythema, Dry mucous membranes, no thyromegaly, trachea midline, no cervical or supraclavicular lymphadenopathy LUNGS: Clear to auscultation without wheeze or crackles, unlabored breathing, good air entry bilaterally Cardiovascular: Normal S1,S2 without murmurs, no edema Abdomen: Soft, nontender, nondistended Skin: Warm, dry        Assessment & Plan:   See Problem List for Assessment and Plan of chronic medical problems.

## 2017-04-04 NOTE — Assessment & Plan Note (Signed)
Acute on chronic Feels like he has difficulty breathing, especially at night, oxygen saturation good here Chest x-ray today Start doxycycline for possible bronchitis or pneumonia We'll also order a CT of the chest given cough and weight loss

## 2017-04-06 ENCOUNTER — Ambulatory Visit (INDEPENDENT_AMBULATORY_CARE_PROVIDER_SITE_OTHER)
Admission: RE | Admit: 2017-04-06 | Discharge: 2017-04-06 | Disposition: A | Discharge status: Home / Self Care | Payer: Medicare Other | Source: Ambulatory Visit | Attending: Internal Medicine | Admitting: Internal Medicine

## 2017-04-06 DIAGNOSIS — R140 Abdominal distension (gaseous): Secondary | ICD-10-CM

## 2017-04-06 DIAGNOSIS — R0602 Shortness of breath: Secondary | ICD-10-CM | POA: Diagnosis not present

## 2017-04-06 DIAGNOSIS — R918 Other nonspecific abnormal finding of lung field: Secondary | ICD-10-CM | POA: Diagnosis not present

## 2017-04-06 DIAGNOSIS — R634 Abnormal weight loss: Secondary | ICD-10-CM | POA: Diagnosis not present

## 2017-04-06 DIAGNOSIS — R109 Unspecified abdominal pain: Secondary | ICD-10-CM | POA: Diagnosis not present

## 2017-04-06 MED ORDER — IOPAMIDOL (ISOVUE-300) INJECTION 61%
100.0000 mL | Freq: Once | INTRAVENOUS | Status: AC | PRN
  Administered 2017-04-06: 100 mL via INTRAVENOUS

## 2017-04-07 ENCOUNTER — Telehealth: Payer: Self-pay | Admitting: Internal Medicine

## 2017-04-07 DIAGNOSIS — Z125 Encounter for screening for malignant neoplasm of prostate: Secondary | ICD-10-CM

## 2017-04-07 DIAGNOSIS — R590 Localized enlarged lymph nodes: Secondary | ICD-10-CM

## 2017-04-07 NOTE — Telephone Encounter (Signed)
CT of the chest shows probable pneumonia.  The antibiotic I prescribed should treat this.  He does have some increased lymph nodes deep in his abdomen.  This could possibly represent cancer, but he needs further evaluation and testing.  I would like to refer him to see a cancer specialist to help is figure out if he does have cancer or not.   I would like him to have additional blood work as well - .  I would like to check his PSA to rule out prostate cancer

## 2017-04-08 NOTE — Telephone Encounter (Signed)
LVM with pts daughter to call back to discuss results. Daughter needs to talk to assistant for results.

## 2017-04-10 ENCOUNTER — Encounter (HOSPITAL_COMMUNITY): Payer: Self-pay

## 2017-04-10 ENCOUNTER — Emergency Department (HOSPITAL_COMMUNITY): Payer: Medicare Other

## 2017-04-10 ENCOUNTER — Inpatient Hospital Stay (HOSPITAL_COMMUNITY)
Admission: EM | Admit: 2017-04-10 | Discharge: 2017-04-19 | DRG: 070 | Disposition: A | Discharge status: Hospice | Payer: Medicare Other | Attending: Internal Medicine | Admitting: Internal Medicine

## 2017-04-10 DIAGNOSIS — N400 Enlarged prostate without lower urinary tract symptoms: Secondary | ICD-10-CM | POA: Diagnosis present

## 2017-04-10 DIAGNOSIS — G9341 Metabolic encephalopathy: Secondary | ICD-10-CM | POA: Diagnosis not present

## 2017-04-10 DIAGNOSIS — R0602 Shortness of breath: Secondary | ICD-10-CM | POA: Diagnosis not present

## 2017-04-10 DIAGNOSIS — Z883 Allergy status to other anti-infective agents status: Secondary | ICD-10-CM

## 2017-04-10 DIAGNOSIS — Z8673 Personal history of transient ischemic attack (TIA), and cerebral infarction without residual deficits: Secondary | ICD-10-CM

## 2017-04-10 DIAGNOSIS — Z23 Encounter for immunization: Secondary | ICD-10-CM

## 2017-04-10 DIAGNOSIS — I252 Old myocardial infarction: Secondary | ICD-10-CM | POA: Diagnosis not present

## 2017-04-10 DIAGNOSIS — R4182 Altered mental status, unspecified: Secondary | ICD-10-CM | POA: Diagnosis not present

## 2017-04-10 DIAGNOSIS — I482 Chronic atrial fibrillation: Secondary | ICD-10-CM | POA: Diagnosis present

## 2017-04-10 DIAGNOSIS — Z436 Encounter for attention to other artificial openings of urinary tract: Secondary | ICD-10-CM | POA: Diagnosis not present

## 2017-04-10 DIAGNOSIS — E785 Hyperlipidemia, unspecified: Secondary | ICD-10-CM | POA: Diagnosis present

## 2017-04-10 DIAGNOSIS — F332 Major depressive disorder, recurrent severe without psychotic features: Secondary | ICD-10-CM | POA: Diagnosis present

## 2017-04-10 DIAGNOSIS — J90 Pleural effusion, not elsewhere classified: Secondary | ICD-10-CM | POA: Diagnosis not present

## 2017-04-10 DIAGNOSIS — E871 Hypo-osmolality and hyponatremia: Secondary | ICD-10-CM | POA: Diagnosis present

## 2017-04-10 DIAGNOSIS — N179 Acute kidney failure, unspecified: Secondary | ICD-10-CM | POA: Diagnosis present

## 2017-04-10 DIAGNOSIS — R590 Localized enlarged lymph nodes: Secondary | ICD-10-CM | POA: Diagnosis not present

## 2017-04-10 DIAGNOSIS — E440 Moderate protein-calorie malnutrition: Secondary | ICD-10-CM | POA: Diagnosis present

## 2017-04-10 DIAGNOSIS — N183 Chronic kidney disease, stage 3 (moderate): Secondary | ICD-10-CM | POA: Diagnosis present

## 2017-04-10 DIAGNOSIS — Z8249 Family history of ischemic heart disease and other diseases of the circulatory system: Secondary | ICD-10-CM

## 2017-04-10 DIAGNOSIS — D631 Anemia in chronic kidney disease: Secondary | ICD-10-CM | POA: Diagnosis present

## 2017-04-10 DIAGNOSIS — F0390 Unspecified dementia without behavioral disturbance: Secondary | ICD-10-CM | POA: Diagnosis present

## 2017-04-10 DIAGNOSIS — I255 Ischemic cardiomyopathy: Secondary | ICD-10-CM | POA: Diagnosis present

## 2017-04-10 DIAGNOSIS — I340 Nonrheumatic mitral (valve) insufficiency: Secondary | ICD-10-CM | POA: Diagnosis not present

## 2017-04-10 DIAGNOSIS — Z85828 Personal history of other malignant neoplasm of skin: Secondary | ICD-10-CM

## 2017-04-10 DIAGNOSIS — I5022 Chronic systolic (congestive) heart failure: Secondary | ICD-10-CM | POA: Diagnosis present

## 2017-04-10 DIAGNOSIS — J189 Pneumonia, unspecified organism: Secondary | ICD-10-CM | POA: Diagnosis present

## 2017-04-10 DIAGNOSIS — R0600 Dyspnea, unspecified: Secondary | ICD-10-CM | POA: Diagnosis present

## 2017-04-10 DIAGNOSIS — Z7902 Long term (current) use of antithrombotics/antiplatelets: Secondary | ICD-10-CM

## 2017-04-10 DIAGNOSIS — Z87891 Personal history of nicotine dependence: Secondary | ICD-10-CM | POA: Diagnosis not present

## 2017-04-10 DIAGNOSIS — I130 Hypertensive heart and chronic kidney disease with heart failure and stage 1 through stage 4 chronic kidney disease, or unspecified chronic kidney disease: Secondary | ICD-10-CM | POA: Diagnosis present

## 2017-04-10 DIAGNOSIS — Z7401 Bed confinement status: Secondary | ICD-10-CM

## 2017-04-10 DIAGNOSIS — R140 Abdominal distension (gaseous): Secondary | ICD-10-CM | POA: Diagnosis not present

## 2017-04-10 DIAGNOSIS — Z515 Encounter for palliative care: Secondary | ICD-10-CM | POA: Diagnosis not present

## 2017-04-10 DIAGNOSIS — I493 Ventricular premature depolarization: Secondary | ICD-10-CM | POA: Diagnosis present

## 2017-04-10 DIAGNOSIS — Z95810 Presence of automatic (implantable) cardiac defibrillator: Secondary | ICD-10-CM

## 2017-04-10 DIAGNOSIS — Z96652 Presence of left artificial knee joint: Secondary | ICD-10-CM | POA: Diagnosis present

## 2017-04-10 DIAGNOSIS — F4324 Adjustment disorder with disturbance of conduct: Secondary | ICD-10-CM | POA: Diagnosis not present

## 2017-04-10 DIAGNOSIS — E860 Dehydration: Secondary | ICD-10-CM | POA: Diagnosis present

## 2017-04-10 DIAGNOSIS — G4700 Insomnia, unspecified: Secondary | ICD-10-CM | POA: Diagnosis present

## 2017-04-10 DIAGNOSIS — R627 Adult failure to thrive: Secondary | ICD-10-CM | POA: Diagnosis present

## 2017-04-10 DIAGNOSIS — F3289 Other specified depressive episodes: Secondary | ICD-10-CM | POA: Diagnosis not present

## 2017-04-10 DIAGNOSIS — E870 Hyperosmolality and hypernatremia: Secondary | ICD-10-CM | POA: Diagnosis not present

## 2017-04-10 DIAGNOSIS — F329 Major depressive disorder, single episode, unspecified: Secondary | ICD-10-CM | POA: Diagnosis present

## 2017-04-10 DIAGNOSIS — Z9079 Acquired absence of other genital organ(s): Secondary | ICD-10-CM

## 2017-04-10 DIAGNOSIS — F918 Other conduct disorders: Secondary | ICD-10-CM | POA: Diagnosis not present

## 2017-04-10 DIAGNOSIS — K219 Gastro-esophageal reflux disease without esophagitis: Secondary | ICD-10-CM | POA: Diagnosis not present

## 2017-04-10 DIAGNOSIS — Z6821 Body mass index (BMI) 21.0-21.9, adult: Secondary | ICD-10-CM

## 2017-04-10 DIAGNOSIS — F32A Depression, unspecified: Secondary | ICD-10-CM | POA: Diagnosis present

## 2017-04-10 DIAGNOSIS — R64 Cachexia: Secondary | ICD-10-CM | POA: Diagnosis present

## 2017-04-10 DIAGNOSIS — Z79899 Other long term (current) drug therapy: Secondary | ICD-10-CM

## 2017-04-10 DIAGNOSIS — R000 Tachycardia, unspecified: Secondary | ICD-10-CM | POA: Diagnosis not present

## 2017-04-10 DIAGNOSIS — E46 Unspecified protein-calorie malnutrition: Secondary | ICD-10-CM | POA: Diagnosis not present

## 2017-04-10 DIAGNOSIS — Z880 Allergy status to penicillin: Secondary | ICD-10-CM

## 2017-04-10 DIAGNOSIS — R410 Disorientation, unspecified: Secondary | ICD-10-CM | POA: Diagnosis not present

## 2017-04-10 DIAGNOSIS — D509 Iron deficiency anemia, unspecified: Secondary | ICD-10-CM | POA: Diagnosis present

## 2017-04-10 DIAGNOSIS — J181 Lobar pneumonia, unspecified organism: Secondary | ICD-10-CM | POA: Diagnosis present

## 2017-04-10 DIAGNOSIS — R4689 Other symptoms and signs involving appearance and behavior: Secondary | ICD-10-CM

## 2017-04-10 DIAGNOSIS — R680 Hypothermia, not associated with low environmental temperature: Secondary | ICD-10-CM | POA: Diagnosis not present

## 2017-04-10 DIAGNOSIS — I2510 Atherosclerotic heart disease of native coronary artery without angina pectoris: Secondary | ICD-10-CM | POA: Diagnosis present

## 2017-04-10 DIAGNOSIS — Z66 Do not resuscitate: Secondary | ICD-10-CM | POA: Diagnosis present

## 2017-04-10 DIAGNOSIS — R45851 Suicidal ideations: Secondary | ICD-10-CM | POA: Diagnosis not present

## 2017-04-10 LAB — I-STAT CG4 LACTIC ACID, ED
LACTIC ACID, VENOUS: 2.9 mmol/L — AB (ref 0.5–1.9)
LACTIC ACID, VENOUS: 1.79 mmol/L (ref 0.5–1.9)
Lactic Acid, Venous: 2.12 mmol/L (ref 0.5–1.9)

## 2017-04-10 LAB — CBC WITH DIFFERENTIAL/PLATELET
Basophils Absolute: 0 10*3/uL (ref 0.0–0.1)
Basophils Relative: 0 %
Eosinophils Absolute: 0 10*3/uL (ref 0.0–0.7)
Eosinophils Relative: 0 %
HEMATOCRIT: 35.6 % — AB (ref 39.0–52.0)
HEMOGLOBIN: 11.4 g/dL — AB (ref 13.0–17.0)
LYMPHS ABS: 1 10*3/uL (ref 0.7–4.0)
LYMPHS PCT: 13 %
MCH: 27.4 pg (ref 26.0–34.0)
MCHC: 32 g/dL (ref 30.0–36.0)
MCV: 85.6 fL (ref 78.0–100.0)
Monocytes Absolute: 0.5 10*3/uL (ref 0.1–1.0)
Monocytes Relative: 6 %
NEUTROS ABS: 6 10*3/uL (ref 1.7–7.7)
NEUTROS PCT: 81 %
Platelets: 216 10*3/uL (ref 150–400)
RBC: 4.16 MIL/uL — ABNORMAL LOW (ref 4.22–5.81)
RDW: 18 % — ABNORMAL HIGH (ref 11.5–15.5)
WBC: 7.5 10*3/uL (ref 4.0–10.5)

## 2017-04-10 LAB — RAPID URINE DRUG SCREEN, HOSP PERFORMED
AMPHETAMINES: NOT DETECTED
BARBITURATES: NOT DETECTED
BENZODIAZEPINES: NOT DETECTED
Cocaine: NOT DETECTED
Opiates: NOT DETECTED
TETRAHYDROCANNABINOL: NOT DETECTED

## 2017-04-10 LAB — TROPONIN I
TROPONIN I: 0.03 ng/mL — AB (ref <0.03)
Troponin I: 0.03 ng/mL (ref <0.03)

## 2017-04-10 LAB — COMPREHENSIVE METABOLIC PANEL
ALT: 17 U/L (ref 17–63)
ANION GAP: 12 (ref 5–15)
AST: 36 U/L (ref 15–41)
Albumin: 3.1 g/dL — ABNORMAL LOW (ref 3.5–5.0)
Alkaline Phosphatase: 397 U/L — ABNORMAL HIGH (ref 38–126)
BILIRUBIN TOTAL: 1.5 mg/dL — AB (ref 0.3–1.2)
BUN: 30 mg/dL — ABNORMAL HIGH (ref 6–20)
CALCIUM: 9.1 mg/dL (ref 8.9–10.3)
CHLORIDE: 106 mmol/L (ref 101–111)
CO2: 24 mmol/L (ref 22–32)
Creatinine, Ser: 1.64 mg/dL — ABNORMAL HIGH (ref 0.61–1.24)
GFR, EST AFRICAN AMERICAN: 43 mL/min — AB (ref >60)
GFR, EST NON AFRICAN AMERICAN: 37 mL/min — AB (ref >60)
Glucose, Bld: 136 mg/dL — ABNORMAL HIGH (ref 65–99)
Potassium: 3.8 mmol/L (ref 3.5–5.1)
Sodium: 142 mmol/L (ref 135–145)
Total Protein: 7.4 g/dL (ref 6.5–8.1)

## 2017-04-10 LAB — URINALYSIS, ROUTINE W REFLEX MICROSCOPIC
Bacteria, UA: NONE SEEN
Bilirubin Urine: NEGATIVE
Glucose, UA: NEGATIVE mg/dL
Ketones, ur: NEGATIVE mg/dL
LEUKOCYTES UA: NEGATIVE
Nitrite: NEGATIVE
Protein, ur: NEGATIVE mg/dL
SPECIFIC GRAVITY, URINE: 1.011 (ref 1.005–1.030)
SQUAMOUS EPITHELIAL / LPF: NONE SEEN
pH: 5 (ref 5.0–8.0)

## 2017-04-10 LAB — ETHANOL

## 2017-04-10 LAB — MAGNESIUM: MAGNESIUM: 1.9 mg/dL (ref 1.7–2.4)

## 2017-04-10 LAB — CBG MONITORING, ED: GLUCOSE-CAPILLARY: 124 mg/dL — AB (ref 65–99)

## 2017-04-10 LAB — PHOSPHORUS: PHOSPHORUS: 3.7 mg/dL (ref 2.5–4.6)

## 2017-04-10 MED ORDER — DOXYCYCLINE HYCLATE 100 MG PO TABS
100.0000 mg | ORAL_TABLET | Freq: Two times a day (BID) | ORAL | Status: DC
  Administered 2017-04-10 – 2017-04-11 (×4): 100 mg via ORAL
  Filled 2017-04-10 (×5): qty 1

## 2017-04-10 MED ORDER — SODIUM CHLORIDE 0.9 % IV SOLN
Freq: Once | INTRAVENOUS | Status: AC
  Administered 2017-04-10: 12:00:00 via INTRAVENOUS

## 2017-04-10 MED ORDER — ONDANSETRON HCL 4 MG PO TABS: 4.0000 mg | ORAL_TABLET | Freq: Four times a day (QID) | ORAL | Status: DC | PRN

## 2017-04-10 MED ORDER — FUROSEMIDE 40 MG PO TABS
40.0000 mg | ORAL_TABLET | Freq: Every day | ORAL | Status: DC
  Administered 2017-04-10: 40 mg via ORAL
  Filled 2017-04-10: qty 1

## 2017-04-10 MED ORDER — ASPIRIN 81 MG PO CHEW
324.0000 mg | CHEWABLE_TABLET | Freq: Every day | ORAL | Status: DC
  Administered 2017-04-10 – 2017-04-19 (×9): 324 mg via ORAL
  Filled 2017-04-10 (×9): qty 4

## 2017-04-10 MED ORDER — METOPROLOL TARTRATE 25 MG PO TABS
25.0000 mg | ORAL_TABLET | Freq: Two times a day (BID) | ORAL | Status: DC
  Administered 2017-04-10 – 2017-04-19 (×18): 25 mg via ORAL
  Filled 2017-04-10 (×18): qty 1

## 2017-04-10 MED ORDER — DABIGATRAN ETEXILATE MESYLATE 150 MG PO CAPS
150.0000 mg | ORAL_CAPSULE | Freq: Two times a day (BID) | ORAL | Status: DC
  Administered 2017-04-10 – 2017-04-19 (×10): 150 mg via ORAL
  Filled 2017-04-10 (×20): qty 1

## 2017-04-10 MED ORDER — ONDANSETRON HCL 4 MG/2ML IJ SOLN: 4.0000 mg | Freq: Four times a day (QID) | INTRAMUSCULAR | Status: DC | PRN

## 2017-04-10 MED ORDER — INFLUENZA VAC SPLIT HIGH-DOSE 0.5 ML IM SUSY
0.5000 mL | PREFILLED_SYRINGE | INTRAMUSCULAR | Status: DC
  Filled 2017-04-10: qty 0.5

## 2017-04-10 MED ORDER — ROSUVASTATIN CALCIUM 10 MG PO TABS
10.0000 mg | ORAL_TABLET | Freq: Every day | ORAL | Status: DC
  Administered 2017-04-10 – 2017-04-18 (×9): 10 mg via ORAL
  Filled 2017-04-10 (×9): qty 1

## 2017-04-10 MED ORDER — DULOXETINE HCL 60 MG PO CPEP
60.0000 mg | ORAL_CAPSULE | Freq: Every day | ORAL | Status: DC
  Administered 2017-04-10 – 2017-04-19 (×9): 60 mg via ORAL
  Filled 2017-04-10 (×4): qty 1
  Filled 2017-04-10: qty 2
  Filled 2017-04-10 (×4): qty 1

## 2017-04-10 MED ORDER — SODIUM CHLORIDE 0.9 % IV SOLN
Freq: Once | INTRAVENOUS | Status: AC
  Administered 2017-04-10: 150 mL/h via INTRAVENOUS

## 2017-04-10 MED ORDER — NITROGLYCERIN 0.4 MG SL SUBL: 0.4000 mg | SUBLINGUAL_TABLET | SUBLINGUAL | Status: DC | PRN

## 2017-04-10 MED ORDER — FINASTERIDE 5 MG PO TABS
5.0000 mg | ORAL_TABLET | Freq: Every day | ORAL | Status: DC
  Administered 2017-04-10 – 2017-04-19 (×9): 5 mg via ORAL
  Filled 2017-04-10 (×9): qty 1

## 2017-04-10 MED ORDER — HYDROCODONE-ACETAMINOPHEN 10-325 MG PO TABS
1.0000 | ORAL_TABLET | Freq: Four times a day (QID) | ORAL | Status: DC | PRN
  Administered 2017-04-10 – 2017-04-19 (×4): 1 via ORAL
  Filled 2017-04-10 (×6): qty 1

## 2017-04-10 MED ORDER — HYDRALAZINE HCL 20 MG/ML IJ SOLN
10.0000 mg | Freq: Three times a day (TID) | INTRAMUSCULAR | Status: DC | PRN
  Filled 2017-04-10: qty 0.5

## 2017-04-10 MED ORDER — PANTOPRAZOLE SODIUM 40 MG PO TBEC
40.0000 mg | DELAYED_RELEASE_TABLET | Freq: Every day | ORAL | Status: DC
  Administered 2017-04-10 – 2017-04-19 (×9): 40 mg via ORAL
  Filled 2017-04-10 (×9): qty 1

## 2017-04-10 MED ORDER — ISOSORBIDE MONONITRATE ER 30 MG PO TB24
30.0000 mg | ORAL_TABLET | Freq: Every day | ORAL | Status: DC
  Administered 2017-04-10 – 2017-04-19 (×9): 30 mg via ORAL
  Filled 2017-04-10 (×9): qty 1

## 2017-04-10 MED ORDER — ACETAMINOPHEN 325 MG PO TABS: 650.0000 mg | ORAL_TABLET | Freq: Four times a day (QID) | ORAL | Status: DC | PRN

## 2017-04-10 MED ORDER — ENSURE ENLIVE PO LIQD
237.0000 mL | Freq: Two times a day (BID) | ORAL | Status: DC
  Administered 2017-04-10 – 2017-04-13 (×7): 237 mL via ORAL
  Filled 2017-04-10 (×2): qty 237

## 2017-04-10 MED ORDER — HYDROCODONE-ACETAMINOPHEN 10-325 MG PO TABS: 1.0000 | ORAL_TABLET | Freq: Four times a day (QID) | ORAL | Status: DC | PRN

## 2017-04-10 MED ORDER — SODIUM CHLORIDE 0.9 % IV SOLN
INTRAVENOUS | Status: AC
  Administered 2017-04-10 – 2017-04-11 (×3): via INTRAVENOUS

## 2017-04-10 MED ORDER — ADULT MULTIVITAMIN W/MINERALS CH
1.0000 | ORAL_TABLET | Freq: Every day | ORAL | Status: DC
  Administered 2017-04-10 – 2017-04-19 (×9): 1 via ORAL
  Filled 2017-04-10 (×9): qty 1

## 2017-04-10 MED ORDER — ACETAMINOPHEN 650 MG RE SUPP: 650.0000 mg | Freq: Four times a day (QID) | RECTAL | Status: DC | PRN

## 2017-04-10 NOTE — BH Assessment (Signed)
[facility] Assessment Progress Note  Per Patriciaann Clan, PA pt is recommended for gero-psych treatment. TTS to seek placement. EDP Dr. Florina Ou, MD and pt's nurse Vassie Loll, RN notified of disposition and in agreement.   Lind Covert, MSW, LCSW Therapeutic Triage Specialist  [phone]

## 2017-04-10 NOTE — H&P (Signed)
[facility] History and Physical  Jacob [name] MVH:[id number] DOB: 05-20-1932 DOA: 04/10/2017  Referring physician:  PCP: Binnie Rail, MD   Chief Complaint:   HPI: Jacob [name] is a 83 y.o. male past medical history significant for BPH, CHF, esophageal stricture presented to the emergency room for weakness and confusion.  Patient was recently diagnosed and being treated for pneumonia with doxycycline started on 10/22.  The day he was brought to the emergency room he had been seen outside try to climb through a window of his house in his underwear.  Patient was disoriented.  ED course: Chest x-ray show remaining a pneumonia CT head showed no acute stroke.  Hospitalist consulted for admission.   Review of Systems:  As per HPI otherwise 10 point review of systems negative. Pt is altered.   Past Medical History:  Diagnosis Date  . AICD (automatic cardioverter/defibrillator) present   . Arthritis   . Atrial fibrillation ([place])   . Benign prostatic hypertrophy   . BPH (benign prostatic hyperplasia)   . CHF (congestive heart failure) ([place])   . Chronic systolic heart failure (HCC)    a. EF 25-30% in past; b.Echo 11/08/11: Normal LV wall thickness, EF 45-50%. c. Echo 04/17: EF 15-20%, diffuse akinesis, moderate MR, mod dilated LA, trivial PE   . Coronary artery disease    a. Chronically occ RCA. cardiac cath 10/2010: Mid LAD 20-30%, distal LAD 40%, mid D1 40%, mid circumflex 20-30%, distal RCA occluded with left to right collaterals. c. 10/2015: cath- relatively stable dz since 2012.    . Depression   . Esophageal stricture   . GERD (gastroesophageal reflux disease)   . Hemorrhoids   . History of diverticulitis of colon   . History of kidney stones   . History of nephrolithiasis   . HLD (hyperlipidemia)   . HTN (hypertension)   . Ischemic cardiomyopathy    a. s/p ICD 2008   . Myocardial infarction ([place])   . Pneumonia   . Renal insufficiency    Renal failure in July 2012  secondary to hypotension, ARB use, and   . Skin cancer    skin neck  . Stroke ([place])    tia's  . TIA (transient ischemic attack) 10/2011   carotids neg for ICA stenosis  . Urinary retention    foley indwelling 12/2010-04/2011   Past Surgical History:  Procedure Laterality Date  . CARDIAC CATHETERIZATION  11-05-10  . CARDIAC CATHETERIZATION N/A 10/14/2015   Procedure: Right/Left Heart Cath and Coronary Angiography;  Surgeon: Leonie Man, MD;  Location: [facility];  Service: Cardiovascular;  Laterality: N/A;  . CARDIAC DEFIBRILLATOR PLACEMENT  12/2006   AICD  . CATARACTS REMOVED    . EP IMPLANTABLE DEVICE N/A 11/11/2015   Procedure:  ICD Generator Changeout;  Surgeon: Evans Lance, MD;  Location: [facility];  Service: Cardiovascular;  Laterality: N/A;  . ESOPHAGOGASTRODUODENOSCOPY N/A 01/24/2014   Procedure: ESOPHAGOGASTRODUODENOSCOPY (EGD);  Surgeon: Milus Banister, MD;  Location: [facility];  Service: Endoscopy;  Laterality: N/A;  . ESOPHAGOGASTRODUODENOSCOPY N/A 02/14/2016   Procedure: ESOPHAGOGASTRODUODENOSCOPY (EGD);  Surgeon: Mauri Pole, MD;  Location: [facility];  Service: Endoscopy;  Laterality: N/A;  . JOINT REPLACEMENT    . TOTAL KNEE ARTHROPLASTY  01/04/11   L TKA  . TRANSURETHRAL RESECTION OF PROSTATE  06/14/2011   Procedure: TRANSURETHRAL RESECTION OF THE PROSTATE (TURP);  Surgeon: Bernestine Amass, MD;  Location: [facility];  Service: Urology;  Laterality: N/A;   Social History:  reports that he quit smoking about 48 years ago. He has a 60.00 pack-year smoking history. He has never used smokeless tobacco. He reports that he does not drink alcohol or use drugs.  Allergies  Allergen Reactions  . Bactrim [Sulfamethoxazole-Trimethoprim] Other (See Comments)    Throat swelling  . Penicillins Itching, Swelling and Rash    Has patient had a PCN reaction causing immediate rash, facial/tongue/throat swelling, SOB or lightheadedness with hypotension:unsure Has  patient had a PCN reaction causing severe rash involving mucus membranes or skin necrosis:No Has patient had a PCN reaction that required hospitalization:No Has patient had a PCN reaction occurring within the last 10 years:No If all of the above answers are "NO", then may proceed with Cephalosporin use.     Family History  Problem Relation Age of Onset  . Heart attack Mother 38       pt unsure of age  . Liver disease Father 75  . Heart disease Son        x 2  . Colon cancer Neg Hx      Prior to Admission medications   Medication Sig Start Date End Date Taking? Authorizing Provider  dabigatran (PRADAXA) 150 MG CAPS capsule Take 1 capsule (150 mg total) by mouth 2 (two) times daily. 07/07/15  Yes Burns, Claudina Lick, MD  doxycycline (VIBRA-TABS) 100 MG tablet Take 1 tablet (100 mg total) by mouth 2 (two) times daily. 04/04/17  Yes Burns, Claudina Lick, MD  DULoxetine (CYMBALTA) 60 MG capsule Take 1 capsule (60 mg total) by mouth daily. 02/22/17  Yes Burns, Claudina Lick, MD  feeding supplement, ENSURE ENLIVE, (ENSURE ENLIVE) LIQD Take 237 mLs by mouth 2 (two) times daily between meals. 11/19/16  Yes Arrien, Jimmy Picket, MD  finasteride (PROSCAR) 5 MG tablet TAKE 1 TABLET(5 MG) BY MOUTH DAILY Patient taking differently: TAKE 1 TABLET(5 MG) BY MOUTH DAILY WITH LUNCH 04/01/16  Yes Burns, Claudina Lick, MD  fluticasone (FLONASE) 50 MCG/ACT nasal spray SHAKE LIQUID AND USE 2 SPRAYS IN EACH NOSTRIL DAILY AS NEEDED FOR ALLERGIES OR RHINITIS 02/07/17  Yes Burns, Claudina Lick, MD  furosemide (LASIX) 40 MG tablet TAKE 1 TABLET BY MOUTH DAILY Patient taking differently: TAKE 1 TABLET BY MOUTH DAILY IN THE MORNING 04/26/16  Yes Strader, [place] M, PA-C  HYDROcodone-acetaminophen (NORCO) 10-325 MG tablet Take 1 tablet by mouth 4 (four) times daily as needed for pain. 04/04/17  Yes [provider]  isosorbide mononitrate (IMDUR) 30 MG 24 hr tablet TAKE 1 TABLET(30 MG) BY MOUTH DAILY 12/16/16  Yes Burns, Claudina Lick, MD    metoprolol tartrate (LOPRESSOR) 25 MG tablet TAKE 1 TABLET BY MOUTH TWICE DAILY 08/17/16  Yes Strader, [place] M, PA-C  Multiple Vitamin (MULTIVITAMIN WITH MINERALS) TABS tablet Take 1 tablet by mouth daily. 11/19/16  Yes Arrien, Jimmy Picket, MD  nitroGLYCERIN (NITROSTAT) 0.4 MG SL tablet PLACE 1 TABLET UNDER THE TONGUE EVERY 5 MINUTES FOR 3 DOSES AS NEEDED FOR CHEST PAIN 02/28/17  Yes Burns, Claudina Lick, MD  omeprazole (PRILOSEC) 40 MG capsule TAKE 1 CAPSULE(40 MG) BY MOUTH DAILY 02/07/17  Yes Burns, Claudina Lick, MD  QUEtiapine (SEROQUEL) 25 MG tablet Take 1 tablet (25 mg total) by mouth at bedtime. 11/19/16  Yes Arrien, Jimmy Picket, MD  ranitidine (ZANTAC) 300 MG tablet TAKE 1 TABLET(300 MG) BY MOUTH AT BEDTIME 04/01/16  Yes Burns, Claudina Lick, MD  rosuvastatin (CRESTOR) 10 MG tablet TAKE 1 TABLET BY MOUTH AT BEDTIME  04/26/16  Yes Strader, [place] M, PA-C  sucralfate (CARAFATE) 1 GM/10ML suspension Take 10 mLs (1 g total) by mouth 4 (four) times daily -  with meals and at bedtime. Patient taking differently: Take 1 g by mouth 4 (four) times daily as needed (for stomach issues.).  02/16/16  Yes Theodis Blaze, MD   Physical Exam: Vitals:   04/10/17 0200 04/10/17 0708 04/10/17 0710 04/10/17 1126  BP: 128/90 (!) 143/79 (!) 143/79 131/75  Pulse: (!) 106 98 (!) 110 (!) 104  Resp: (!) 21 20 18 18   Temp:  97.7 F (36.5 C) 97.7 F (36.5 C) 98 F (36.7 C)  TempSrc:  Oral Oral Rectal  SpO2: 93% 94% 95% 99%  Weight:      Height:        Wt Readings from Last 3 Encounters:  04/10/17 61.7 kg (136 lb)  04/04/17 61.7 kg (136 lb)  02/22/17 64 kg (141 lb)    General:  Appears calm and comfortable; A&Ox3 (with prompting) Eyes:  PERRL, EOMI, normal lids, iris ENT:  grossly normal hearing, lips & tongue Neck:  no LAD, masses or thyromegaly Cardiovascular:  RRR, no m/r/g. No LE edema.  Respiratory:  Crackles basilar, no w/r. Normal respiratory effort. Abdomen:  soft, ntnd Skin:  no rash or induration seen  on limited exam Musculoskeletal:  grossly normal tone BUE/BLE Psychiatric:  grossly normal mood and affect, speech fluent and appropriate Neurologic:  CN 2-12 grossly intact, moves all extremities in coordinated fashion.          Labs on Admission:  Basic Metabolic Panel:  Recent Labs Lab 04/04/17 1641 04/10/17 0057  NA 142 142  K 3.5 3.8  CL 106 106  CO2 29 24  GLUCOSE 136* 136*  BUN 18 30*  CREATININE 1.21 1.64*  CALCIUM 9.0 9.1   Liver Function Tests:  Recent Labs Lab 04/04/17 1641 04/10/17 0057  AST 11 36  ALT 5 17  ALKPHOS 397* 397*  BILITOT 0.8 1.5*  PROT 7.0 7.4  ALBUMIN 3.1* 3.1*   No results for input(s): LIPASE, AMYLASE in the last 168 hours. No results for input(s): AMMONIA in the last 168 hours. CBC:  Recent Labs Lab 04/04/17 1641 04/10/17 0057  WBC 6.3 7.5  NEUTROABS 4.7 6.0  HGB 11.3* 11.4*  HCT 35.4* 35.6*  MCV 84.6 85.6  PLT 230.0 216   Cardiac Enzymes:  Recent Labs Lab 04/10/17 0057 04/10/17 1210  TROPONINI 0.03* 0.03*    BNP (last 3 results) No results for input(s): BNP in the last 8760 hours.  ProBNP (last 3 results) No results for input(s): PROBNP in the last 8760 hours.   Serum creatinine: 1.64 mg/dL (H) 04/10/17 0057 Estimated creatinine clearance: 29.8 mL/min (A)  CBG:  Recent Labs Lab 04/10/17 0050  GLUCAP 124*    Radiological Exams on Admission: Dg Chest 2 View  Result Date: 04/10/2017 CLINICAL DATA:  Altered mental status, possible lung infection EXAM: CHEST  2 VIEW COMPARISON:  04/06/2017 CT, 04/04/2017 FINDINGS: Left-sided pacing device as before. Similar appearance of upper lobe interstitial and alveolar opacity. Increasing opacity at the left greater than right lung base. Development of small pleural effusions. Mild cardiomegaly with aortic atherosclerosis. No pneumothorax. IMPRESSION: 1. Development of small pleural effusions. 2. Similar appearance of biapical interstitial and alveolar opacity 3.  Worsening atelectasis or infiltrate at the left greater than right lung base Electronically Signed   By: Donavan Foil M.D.   On: 04/10/2017 01:35   Ct Head  Wo Contrast  Result Date: 04/10/2017 CLINICAL DATA:  Worsening confusion. History of hypertension, stroke, AICD. EXAM: CT HEAD WITHOUT CONTRAST TECHNIQUE: Contiguous axial images were obtained from the base of the skull through the vertex without intravenous contrast. COMPARISON:  CT HEAD November 16, 2016 FINDINGS: BRAIN: No intraparenchymal hemorrhage, mass effect nor midline shift. Moderate to severe parenchymal brain volume loss similar to prior examination, no hydrocephalus. Old bilateral basal ganglia lacunar infarcts. Confluent supratentorial white matter hypodensities. No acute large vascular territory infarcts. No abnormal extra-axial fluid collections. VASCULAR: Moderate calcific atherosclerosis of the carotid siphons. SKULL: No skull fracture. No significant scalp soft tissue swelling. SINUSES/ORBITS: The mastoid air-cells and included paranasal sinuses are well-aerated.The included ocular globes and orbital contents are non-suspicious. Status post bilateral ocular lens implants. OTHER: None. IMPRESSION: 1. No acute intracranial process. 2. Stable moderate to severe parenchymal brain volume loss. 3. Moderate to severe chronic small vessel ischemic disease and old lacunar infarcts. Electronically Signed   By: Elon Alas M.D.   On: 04/10/2017 02:23    EKG: Independently reviewed. IRBBB, tachycardia, PVC, no STEMI  Assessment/Plan Active Problems:   Dehydration   Adjustment disorder with disturbance of conduct   Acute encephalopathy Delirium? Thought to be due to dehydration Pt was seen by [facility] - would have re-eval after few days of tx Fluids given in ED CT head neg CXR and UA neg for signs infxn UDS neg Will monitor Tried to contact family but no answer ETOH pending Blood cult pending x2  Dehydration LA trended down with  fluids Cont IVF  Infiltrate on CXR Pna? Started on 10/22 Started on doxy in ED, will continue  Weak PT eval  CHF Hold lasix, restart tomorrow if kidneys better  Trop elevated Mild at 0.03 Will trend ECHO ordered ASA ordered Prn ntg cp  CAD Cont pradaxa  AKI/CKD III Baseline Cr 1.20-1.27, Cr on admit 1.64 IVF given in the emergency room Gentle hydration overnight [place] due to dehydration  BPH Cont proscar  GERD Cont PPI  Hyperlipidemia Continue statin   Code Status: DNR DVT Prophylaxis: pradaxa Family Communication: called and left VM Disposition Plan: Needs Geri-psych placement  Status: inpt tele  Elwin Mocha, MD Family Medicine [facility] [email or web address] Password [email or web address]

## 2017-04-10 NOTE — ED Triage Notes (Addendum)
Pt found wondering outdoors by family brought to ED for evaluation. Family report confusion has increased but pt answers questions appropriately for EMS staff. Pt admits tonot eating well states "I give my food to the dogs". VS  BP:134/92 P: 102 A-Fib, R: 16, CBG: 175 O2 Sat 100% RA

## 2017-04-10 NOTE — [facility] Suicide Risk Assessment (Addendum)
Suicide Risk Assessment  Discharge Assessment   [facility] Discharge Suicide Risk Assessment   Principal Problem: <principal problem not specified> Discharge Diagnoses:  Patient Active Problem List   Diagnosis Date Noted  . Adjustment disorder with disturbance of conduct [F43.24] 04/10/2017    Priority: High  . Malnutrition ([place]) [E46] 04/04/2017  . Abdominal distension (gaseous) [R14.0] 04/04/2017  . Shortness of breath [R06.02] 04/04/2017  . Weight loss [R63.4] 02/22/2017  . Cough [R05] 02/22/2017  . Right hip pain [M25.551] 02/22/2017  . Acute pain of left shoulder [M25.512] 01/03/2017  . Encephalopathy [G93.40] 11/18/2016  . Dehydration [E86.0]   . Low vitamin B12 level [E53.8]   . Low folate [E53.8]   . Acute encephalopathy [G93.40] 11/16/2016  . Preoperative clearance [Z01.818] 10/21/2016  . Anemia [D64.9] 02/23/2016  . Aspiration pneumonitis ([place]) [J69.0] 02/15/2016  . CKD (chronic kidney disease), stage III ([place]) [N18.3] 02/15/2016  . Atrial fibrillation with RVR ([place]) [I48.91]   . Cardiomyopathy, ischemic [I25.5]   . Chronic systolic CHF (congestive heart failure) ([place]) [I50.22] 10/11/2015  . Diplopia [H53.2] 10/11/2015  . Depression [F32.9] 07/07/2015  . Esophageal obstruction due to food impaction [K22.2, T18.128A] 01/24/2014  . Syncope [R55] 02/22/2013  . CAD (coronary artery disease) [I25.10] 02/22/2013  . GERD (gastroesophageal reflux disease) [K21.9] 02/22/2013  . OA (osteoarthritis) of knee [M17.10] 02/22/2013  . TIA (transient ischemic attack) [G45.9] 11/07/2011  . Urinary retention [R33.9] 04/13/2011  . NEVUS, ATYPICAL [D23.9] 08/26/2010  . ECZEMA [L25.9] 05/27/2010  . Atrial fibrillation ([place]) [I48.91] 04/29/2010  . ROTATOR CUFF INJURY, RIGHT SHOULDER [M71.9, M67.919] 09/16/2009  . Essential hypertension [I10] 04/23/2009  . BENIGN PROSTATIC HYPERTROPHY, WITH OBSTRUCTION [N40.1] 04/23/2009  . ARTHRITIS [M12.9] 04/23/2009  . HYPERCHOLESTEROLEMIA, MIXED [E78.00]  04/22/2008  . Automatic implantable cardioverter-defibrillator in situ [Z95.810] 04/22/2008    Total Time spent with patient: 45 minutes  Musculoskeletal: Strength & Muscle Tone: decreased Gait & Station: normal Patient leans: N/A  Psychiatric Specialty Exam:   Blood pressure (!) 143/79, pulse (!) 110, temperature 97.7 F (36.5 C), temperature source Oral, resp. rate 18, height 5\' 7"  (1.702 m), weight 61.7 kg (136 lb), SpO2 95 %.Body mass index is 21.3 kg/m.  General Appearance: Casual  Eye Contact::  Good  Speech:  difficult to understand, soft tone409  Volume:  Decreased  Mood:  Anxious, mild  Affect:  Congruent  Thought Process:  Coherent and Descriptions of Associations: Intact  Orientation:  Other:  person  Thought Content:  WDL  Suicidal Thoughts:  No  Homicidal Thoughts:  No  Memory:  Immediate;   Fair Recent;   Poor Remote;   Poor  Judgement:  Fair  Insight:  Lacking  Psychomotor Activity:  Decreased  Concentration:  Fair  Recall:  [employer] of Knowledge:Fair  Language: Good  Akathisia:  No  Handed:  Right  AIMS (if indicated):     Assets:  Housing Leisure Time Resilience Social Support  Sleep:     Cognition: Impaired,  Mild  ADL's:  Impaired, needs assistance   Mental Status Per Nursing Assessment::   On Admission:   83 yo male who wandering in his house with confusion and AMS.  He was diagnosed with pneumonia on 10/25 this week and referred to an oncologist for possible cancer.  Mr Febles had a similar episode in June when he was dehydrated.  He does take Cymbalta for depression.  No suicidal/homicidal ideations or current psychosis, alert and oriented to person, unsure if this is baseline.  Considering his recent medical issues, his behaviors are most likely related to this physical change of status and abnormal labs.  Rounded on by psychiatry and is psychiatrically cleared by the psychiatrist, Dr. De Nurse.  Demographic Factors:  Male, Age 24 or older  and Caucasian  Loss Factors: Decline in physical health  Historical Factors: NA  Risk Reduction Factors:   Sense of responsibility to family, Living with another person, especially a relative and Positive social support  Continued Clinical Symptoms:  Anxiety, mild  Cognitive Features That Contribute To Risk:  None    Suicide Risk:  Minimal: No identifiable suicidal ideation.  Patients presenting with no risk factors but with morbid ruminations; may be classified as minimal risk based on the severity of the depressive symptoms    Plan Of Care/Follow-up recommendations:  Activity:  as tolerated Diet:  heart healthy diet  LORD, JAMISON, NP 04/10/2017, 10:07 AM

## 2017-04-10 NOTE — ED Provider Notes (Signed)
Pt seen and cleared by psych given his acute medical conditions including dehydration which is likely a source of his ams and confusion Will start iv hydration, repeat troponin/lactate and admit to hospitalist service   Lacretia Leigh, MD 04/10/17 1129

## 2017-04-10 NOTE — BH Assessment (Addendum)
Assessment Note  Jacob [name] is an 83 y.o. male who presents to the ED voluntarily accompanied by his son. Pt reportedly was wandering around his home yesterday evening wearing only his underwear. Pt's son reports this is unlike the pt and states there is no family hx of mental illness or dementia. Pt's son reports the pt was also saying several dolls in the home were alive. Pt's son states he and his sister (who is the pt's daughter) live with the pt to care for him. Pt's son states the pt continued to tell the daughter that dolls were alive. TTS consulted with Dr. Florina Ou, MD who concludes the pt does not have a UTI at this time. AMS appears to be related to another unknown factor.   During the assessment, the pt appeared to be disheveled. The pt continued attempting to disconnect the cords to the monitors and was observed by this writer taking off Band-Aids and trying to hand them to this Probation officer. Pt was asked to state the year in which he was born and the pt replied "68" stating his actual age instead of 58 which would be the year the pt was born. Pt has reportedly lost weight over the past several weeks due to not eating. Pt has also not been consuming liquids and has been dehydrated according to the pt's son.  Pt was recently evaluated at [facility] on 04/04/17 and was recommended to follow up with a cancer specialist at that time to determine if the pt has cancer due to increased lymph nodes deep in his abdomen. Pt was found to have pneumonia per chart on 04/07/17 and was prescribed antibiotics according to documentation.  Per Patriciaann Clan, PA pt is recommended for gero-psych treatment. TTS to seek placement. EDP Dr. Florina Ou, MD and pt's nurse Vassie Loll, RN notified of disposition and in agreement.   Diagnosis: Delusional Disorder  Past Medical History:  Past Medical History:  Diagnosis Date  . AICD (automatic cardioverter/defibrillator) present   . Arthritis   .  Atrial fibrillation ([place])   . Benign prostatic hypertrophy   . BPH (benign prostatic hyperplasia)   . CHF (congestive heart failure) ([place])   . Chronic systolic heart failure (HCC)    a. EF 25-30% in past; b.Echo 11/08/11: Normal LV wall thickness, EF 45-50%. c. Echo 04/17: EF 15-20%, diffuse akinesis, moderate MR, mod dilated LA, trivial PE   . Coronary artery disease    a. Chronically occ RCA. cardiac cath 10/2010: Mid LAD 20-30%, distal LAD 40%, mid D1 40%, mid circumflex 20-30%, distal RCA occluded with left to right collaterals. c. 10/2015: cath- relatively stable dz since 2012.    . Depression   . Esophageal stricture   . GERD (gastroesophageal reflux disease)   . Hemorrhoids   . History of diverticulitis of colon   . History of kidney stones   . History of nephrolithiasis   . HLD (hyperlipidemia)   . HTN (hypertension)   . Ischemic cardiomyopathy    a. s/p ICD 2008   . Myocardial infarction ([place])   . Pneumonia   . Renal insufficiency    Renal failure in July 2012 secondary to hypotension, ARB use, and   . Skin cancer    skin neck  . Stroke ([place])    tia's  . TIA (transient ischemic attack) 10/2011   carotids neg for ICA stenosis  . Urinary retention    foley indwelling 12/2010-04/2011    Past Surgical History:  Procedure  Laterality Date  . CARDIAC CATHETERIZATION  11-05-10  . CARDIAC CATHETERIZATION N/A 10/14/2015   Procedure: Right/Left Heart Cath and Coronary Angiography;  Surgeon: Leonie Man, MD;  Location: [facility];  Service: Cardiovascular;  Laterality: N/A;  . CARDIAC DEFIBRILLATOR PLACEMENT  12/2006   AICD  . CATARACTS REMOVED    . EP IMPLANTABLE DEVICE N/A 11/11/2015   Procedure:  ICD Generator Changeout;  Surgeon: Evans Lance, MD;  Location: [facility];  Service: Cardiovascular;  Laterality: N/A;  . ESOPHAGOGASTRODUODENOSCOPY N/A 01/24/2014   Procedure: ESOPHAGOGASTRODUODENOSCOPY (EGD);  Surgeon: Milus Banister, MD;  Location: [facility];   Service: Endoscopy;  Laterality: N/A;  . ESOPHAGOGASTRODUODENOSCOPY N/A 02/14/2016   Procedure: ESOPHAGOGASTRODUODENOSCOPY (EGD);  Surgeon: Mauri Pole, MD;  Location: [facility];  Service: Endoscopy;  Laterality: N/A;  . JOINT REPLACEMENT    . TOTAL KNEE ARTHROPLASTY  01/04/11   L TKA  . TRANSURETHRAL RESECTION OF PROSTATE  06/14/2011   Procedure: TRANSURETHRAL RESECTION OF THE PROSTATE (TURP);  Surgeon: Bernestine Amass, MD;  Location: [facility];  Service: Urology;  Laterality: N/A;    Family History:  Family History  Problem Relation Age of Onset  . Heart attack Mother 75       pt unsure of age  . Liver disease Father 74  . Heart disease Son        x 2  . Colon cancer Neg Hx     Social History:  reports that he quit smoking about 48 years ago. He has a 60.00 pack-year smoking history. He has never used smokeless tobacco. He reports that he does not drink alcohol or use drugs.  Additional Social History:  Alcohol / Drug Use Pain Medications: See MAR  Prescriptions: See MAR  Over the Counter: See MAR  History of alcohol / drug use?: No history of alcohol / drug abuse  CIWA: CIWA-Ar BP: 128/90 Pulse Rate: (!) 106 COWS:    Allergies:  Allergies  Allergen Reactions  . Bactrim [Sulfamethoxazole-Trimethoprim] Other (See Comments)    Throat swelling  . Penicillins Itching, Swelling and Rash    Has patient had a PCN reaction causing immediate rash, facial/tongue/throat swelling, SOB or lightheadedness with hypotension:unsure Has patient had a PCN reaction causing severe rash involving mucus membranes or skin necrosis:No Has patient had a PCN reaction that required hospitalization:No Has patient had a PCN reaction occurring within the last 10 years:No If all of the above answers are "NO", then may proceed with Cephalosporin use.     Home Medications:  (Not in a hospital admission)  OB/GYN Status:  No LMP for male patient.  General Assessment Data Location of  Assessment: [facility] ED TTS Assessment: In system Is this a Tele or Face-to-Face Assessment?: Face-to-Face Is this an Initial Assessment or a Re-assessment for this encounter?: Initial Assessment Marital status: Widowed Is patient pregnant?: No Pregnancy Status: No Living Arrangements: Children, Other relatives Can pt return to current living arrangement?: Yes Admission Status: Voluntary Is patient capable of signing voluntary admission?: Yes Referral Source: Self/Family/Friend Insurance type: Medicare     Crisis Care Plan Living Arrangements: Children, Other relatives Name of Psychiatrist: none Name of Therapist: none  Education Status Is patient currently in school?: No Highest grade of school patient has completed: 9th  Risk to self with the past 6 months Suicidal Ideation: No Has patient been a risk to self within the past 6 months prior to admission? : No Suicidal Intent: No Has patient had  any suicidal intent within the past 6 months prior to admission? : No Is patient at risk for suicide?: No Suicidal Plan?: No Has patient had any suicidal plan within the past 6 months prior to admission? : No Access to Means: No What has been your use of drugs/alcohol within the last 12 months?: denies use  Previous Attempts/Gestures: No Triggers for Past Attempts: None known Intentional Self Injurious Behavior: None Family Suicide History: No Recent stressful life event(s): Other (Comment), Recent negative physical changes (possible cancer diagnosis, increased confusion ) Persecutory voices/beliefs?: No Depression: No Substance abuse history and/or treatment for substance abuse?: No Suicide prevention information given to non-admitted patients: Not applicable  Risk to Others within the past 6 months Homicidal Ideation: No Does patient have any lifetime risk of violence toward others beyond the six months prior to admission? : No Thoughts of Harm to Others: No Current Homicidal  Intent: No Current Homicidal Plan: No Access to Homicidal Means: No History of harm to others?: No Assessment of Violence: None Noted Does patient have access to weapons?: Yes (Comment) (pt owns 2 guns that he reports are locked up ) Criminal Charges Pending?: No Does patient have a court date: No Is patient on probation?: No  Psychosis Hallucinations: None noted Delusions: Unspecified  Mental Status Report Appearance/Hygiene: Bizarre Eye Contact: Good Motor Activity: Unsteady, Restlessness Speech: Slurred, Incoherent Level of Consciousness: Alert Mood: Pleasant Affect: Constricted Anxiety Level: Minimal Thought Processes: Flight of Ideas Judgement: Impaired Orientation: Person Obsessive Compulsive Thoughts/Behaviors: Moderate  Cognitive Functioning Concentration: Poor Memory: Recent Impaired, Remote Impaired IQ: Average Insight: Poor Impulse Control: Fair Appetite: Poor Weight Loss: 5 (5 lbs within 5 weeks per chart ) Sleep: Decreased Total Hours of Sleep: 5 Vegetative Symptoms: None  ADLScreening [facility] Assessment Services) Patient's cognitive ability adequate to safely complete daily activities?: Yes Patient able to express need for assistance with ADLs?: Yes Independently performs ADLs?: No  Prior Inpatient Therapy Prior Inpatient Therapy: No  Prior Outpatient Therapy Prior Outpatient Therapy: No Does patient have an ACCT team?: No Does patient have Intensive In-House Services?  : No Does patient have [facility] services? : No Does patient have P4CC services?: No  ADL Screening (condition at time of admission) Patient's cognitive ability adequate to safely complete daily activities?: Yes Does the patient have difficulty seeing, even when wearing glasses/contacts?: No Does the patient have difficulty concentrating, remembering, or making decisions?: Yes Patient able to express need for assistance with ADLs?: Yes Does the patient have difficulty dressing or  bathing?: Yes Independently performs ADLs?: No Communication: Independent Dressing (OT): Needs assistance Is this a change from baseline?: Pre-admission baseline Grooming: Independent Feeding: Independent Bathing: Needs assistance Is this a change from baseline?: Pre-admission baseline Toileting: Needs assistance Is this a change from baseline?: Pre-admission baseline In/Out Bed: Needs assistance Is this a change from baseline?: Pre-admission baseline Walks in Home: Needs assistance Is this a change from baseline?: Pre-admission baseline Does the patient have difficulty walking or climbing stairs?: Yes Weakness of Legs: Both Weakness of Arms/Hands: None  Home Assistive Devices/Equipment Home Assistive Devices/Equipment: Cane (specify quad or straight)    Abuse/Neglect Assessment (Assessment to be complete while patient is alone) Physical Abuse: Denies Verbal Abuse: Denies Sexual Abuse: Denies Exploitation of patient/patient's resources: Denies Self-Neglect: Denies     Regulatory affairs officer (For Healthcare) Does Patient Have a Medical Advance Directive?: Yes Type of Advance Directive: [place] (pt's reports his daughter Doreatha Martin. Lyndee Leo is his POA) Copy of Healthcare Power of  Attorney in Chart?: No - copy requested Would patient like information on creating a medical advance directive?: No - Patient declined    Additional Information 1:1 In Past 12 Months?: No CIRT Risk: No Elopement Risk: Yes Does patient have medical clearance?: Yes     Disposition:  Disposition Initial Assessment Completed for this Encounter: Yes Disposition of Patient: Inpatient treatment program Type of inpatient treatment program: Adult (gero-psych treatment per Patriciaann Clan, PA )  On Site Evaluation by:   Reviewed with Physician:    Lyanne Co 04/10/2017 5:01 AM

## 2017-04-10 NOTE — ED Notes (Signed)
Bed: VS25 Expected date:  Expected time:  Means of arrival:  Comments: 27 m AMS

## 2017-04-10 NOTE — ED Notes (Signed)
Date and time results received: 04/10/17 0432 (use smartphrase ".now" to insert current time)  Test: troponin Critical Value: 0.03  Name of Provider Notified: Dr. Florina Ou  Orders Received? Or Actions Taken?: none

## 2017-04-10 NOTE — ED Notes (Signed)
CALL REPORT April @ 13:05,  [phone]

## 2017-04-10 NOTE — ED Provider Notes (Signed)
[facility] DEPT Provider Note: Jacob Spurling, MD, FACEP  CSN: [id number] MRN: [id number] ARRIVAL: 04/10/17 at [place]: [place]  Altered Mental Status  Level 5 caveat: Altered mental status HISTORY OF PRESENT ILLNESS  04/10/17 1:14 AM Jacob [name] is a 83 y.o. male who has had a decline in his health over the past 2 weeks.  He has been less active than usual.  He has been found lying around the house naked covered only with a sheet.  He has not been eating or drinking adequately.  He was brought to the ED after a family member found him wandering in his yard wearing only his underwear.  He was stating that a toy dog was alive and that someone was trying to crawl into his window.  Per family he has not had a fever.   Past Medical History:  Diagnosis Date  . AICD (automatic cardioverter/defibrillator) present   . Arthritis   . Atrial fibrillation ([place])   . Benign prostatic hypertrophy   . BPH (benign prostatic hyperplasia)   . CHF (congestive heart failure) ([place])   . Chronic systolic heart failure (HCC)    a. EF 25-30% in past; b.Echo 11/08/11: Normal LV wall thickness, EF 45-50%. c. Echo 04/17: EF 15-20%, diffuse akinesis, moderate MR, mod dilated LA, trivial PE   . Coronary artery disease    a. Chronically occ RCA. cardiac cath 10/2010: Mid LAD 20-30%, distal LAD 40%, mid D1 40%, mid circumflex 20-30%, distal RCA occluded with left to right collaterals. c. 10/2015: cath- relatively stable dz since 2012.    . Depression   . Esophageal stricture   . GERD (gastroesophageal reflux disease)   . Hemorrhoids   . History of diverticulitis of colon   . History of kidney stones   . History of nephrolithiasis   . HLD (hyperlipidemia)   . HTN (hypertension)   . Ischemic cardiomyopathy    a. s/p ICD 2008   . Myocardial infarction ([place])   . Pneumonia   . Renal insufficiency    Renal failure in July 2012 secondary to hypotension, ARB use, and   . Skin cancer      skin neck  . Stroke ([place])    tia's  . TIA (transient ischemic attack) 10/2011   carotids neg for ICA stenosis  . Urinary retention    foley indwelling 12/2010-04/2011    Past Surgical History:  Procedure Laterality Date  . CARDIAC CATHETERIZATION  11-05-10  . CARDIAC CATHETERIZATION N/A 10/14/2015   Procedure: Right/Left Heart Cath and Coronary Angiography;  Surgeon: Leonie Man, MD;  Location: [facility];  Service: Cardiovascular;  Laterality: N/A;  . CARDIAC DEFIBRILLATOR PLACEMENT  12/2006   AICD  . CATARACTS REMOVED    . EP IMPLANTABLE DEVICE N/A 11/11/2015   Procedure:  ICD Generator Changeout;  Surgeon: Evans Lance, MD;  Location: [facility];  Service: Cardiovascular;  Laterality: N/A;  . ESOPHAGOGASTRODUODENOSCOPY N/A 01/24/2014   Procedure: ESOPHAGOGASTRODUODENOSCOPY (EGD);  Surgeon: Milus Banister, MD;  Location: [facility];  Service: Endoscopy;  Laterality: N/A;  . ESOPHAGOGASTRODUODENOSCOPY N/A 02/14/2016   Procedure: ESOPHAGOGASTRODUODENOSCOPY (EGD);  Surgeon: Mauri Pole, MD;  Location: [facility];  Service: Endoscopy;  Laterality: N/A;  . JOINT REPLACEMENT    . TOTAL KNEE ARTHROPLASTY  01/04/11   L TKA  . TRANSURETHRAL RESECTION OF PROSTATE  06/14/2011   Procedure: TRANSURETHRAL RESECTION OF THE PROSTATE (TURP);  Surgeon: Bernestine Amass, MD;  Location: [facility];  Service: Urology;  Laterality: N/A;    Family History  Problem Relation Age of Onset  . Heart attack Mother 37       pt unsure of age  . Liver disease Father 14  . Heart disease Son        x 2  . Colon cancer Neg Hx     Social History  Substance Use Topics  . Smoking status: Former Smoker    Packs/day: 2.00    Years: 30.00    Quit date: 06/14/1968  . Smokeless tobacco: Never Used  . Alcohol use No    Prior to Admission medications   Medication Sig Start Date End Date Taking? Authorizing Provider  dabigatran (PRADAXA) 150 MG CAPS capsule Take 1 capsule (150 mg total) by  mouth 2 (two) times daily. 07/07/15  Yes Burns, Claudina Lick, MD  doxycycline (VIBRA-TABS) 100 MG tablet Take 1 tablet (100 mg total) by mouth 2 (two) times daily. 04/04/17  Yes Burns, Claudina Lick, MD  DULoxetine (CYMBALTA) 60 MG capsule Take 1 capsule (60 mg total) by mouth daily. 02/22/17  Yes Burns, Claudina Lick, MD  feeding supplement, ENSURE ENLIVE, (ENSURE ENLIVE) LIQD Take 237 mLs by mouth 2 (two) times daily between meals. 11/19/16  Yes Arrien, Jimmy Picket, MD  finasteride (PROSCAR) 5 MG tablet TAKE 1 TABLET(5 MG) BY MOUTH DAILY Patient taking differently: TAKE 1 TABLET(5 MG) BY MOUTH DAILY WITH LUNCH 04/01/16  Yes Burns, Claudina Lick, MD  fluticasone (FLONASE) 50 MCG/ACT nasal spray SHAKE LIQUID AND USE 2 SPRAYS IN EACH NOSTRIL DAILY AS NEEDED FOR ALLERGIES OR RHINITIS 02/07/17  Yes Burns, Claudina Lick, MD  furosemide (LASIX) 40 MG tablet TAKE 1 TABLET BY MOUTH DAILY Patient taking differently: TAKE 1 TABLET BY MOUTH DAILY IN THE MORNING 04/26/16  Yes Strader, [place] M, PA-C  HYDROcodone-acetaminophen (NORCO) 10-325 MG tablet Take 1 tablet by mouth 4 (four) times daily as needed for pain. 04/04/17  Yes [provider]  isosorbide mononitrate (IMDUR) 30 MG 24 hr tablet TAKE 1 TABLET(30 MG) BY MOUTH DAILY 12/16/16  Yes Burns, Claudina Lick, MD  metoprolol tartrate (LOPRESSOR) 25 MG tablet TAKE 1 TABLET BY MOUTH TWICE DAILY 08/17/16  Yes Strader, [place] M, PA-C  Multiple Vitamin (MULTIVITAMIN WITH MINERALS) TABS tablet Take 1 tablet by mouth daily. 11/19/16  Yes Arrien, Jimmy Picket, MD  nitroGLYCERIN (NITROSTAT) 0.4 MG SL tablet PLACE 1 TABLET UNDER THE TONGUE EVERY 5 MINUTES FOR 3 DOSES AS NEEDED FOR CHEST PAIN 02/28/17  Yes Burns, Claudina Lick, MD  omeprazole (PRILOSEC) 40 MG capsule TAKE 1 CAPSULE(40 MG) BY MOUTH DAILY 02/07/17  Yes Burns, Claudina Lick, MD  QUEtiapine (SEROQUEL) 25 MG tablet Take 1 tablet (25 mg total) by mouth at bedtime. 11/19/16  Yes Arrien, Jimmy Picket, MD  ranitidine (ZANTAC) 300 MG tablet TAKE 1  TABLET(300 MG) BY MOUTH AT BEDTIME 04/01/16  Yes Burns, Claudina Lick, MD  rosuvastatin (CRESTOR) 10 MG tablet TAKE 1 TABLET BY MOUTH AT BEDTIME 04/26/16  Yes Strader, [place] M, PA-C  sucralfate (CARAFATE) 1 GM/10ML suspension Take 10 mLs (1 g total) by mouth 4 (four) times daily -  with meals and at bedtime. Patient taking differently: Take 1 g by mouth 4 (four) times daily as needed (for stomach issues.).  02/16/16  Yes Theodis Blaze, MD  hydrocortisone 2.5 % cream Apply topically 2 (two) times daily. Patient not taking: Reported on 04/10/2017 01/03/17   [place], FNP    Allergies  Bactrim [sulfamethoxazole-trimethoprim] and Penicillins   REVIEW OF SYSTEMS     PHYSICAL EXAMINATION  Initial Vital Signs Blood pressure 120/73, pulse 70, temperature 98.1 F (36.7 C), temperature source Rectal, resp. rate 20, height 5\' 7"  (1.702 m), weight 61.7 kg (136 lb), SpO2 100 %.  Examination General: Well-developed, cachectic male in no acute distress; appearance consistent with age of record HENT: normocephalic; atraumatic; dry mucous membranes; bilateral angular cheilitis Eyes: pupils equal, round and reactive to light; extraocular muscles intact Neck: supple Heart: regular rate and rhythm; frequent ectopy Lungs: clear to auscultation bilaterally Abdomen: soft; nondistended; nontender; bowel sounds present Extremities: No deformity; full range of motion; pulses normal Neurologic: Awake, alert and oriented x 2; motor function intact in all extremities and symmetric; no facial droop Skin: Warm and dry Psychiatric: Flat affect   RESULTS  Summary of this visit's results, reviewed by myself:   EKG Interpretation  Date/Time:  Sunday April 10 2017 00:54:08 EDT Ventricular Rate:  92 PR Interval:    QRS Duration: 96 QT Interval:  403 QTC Calculation: 499 R Axis:   37 Text Interpretation:  Sinus tachycardia Multiple premature complexes, vent & supraven LVH with secondary repolarization  abnormality Ectopy not seen previously Confirmed by Shanon Rosser [phone]) on 04/10/2017 1:05:02 AM      Laboratory Studies: Results for orders placed or performed during the hospital encounter of 04/10/17 (from the past 24 hour(s))  CBG monitoring, ED     Status: Abnormal   Collection Time: 04/10/17 12:50 AM  Result Value Ref Range   Glucose-Capillary 124 (H) 65 - 99 mg/dL  Comprehensive metabolic panel     Status: Abnormal   Collection Time: 04/10/17 12:57 AM  Result Value Ref Range   Sodium 142 135 - 145 mmol/L   Potassium 3.8 3.5 - 5.1 mmol/L   Chloride 106 101 - 111 mmol/L   CO2 24 22 - 32 mmol/L   Glucose, Bld 136 (H) 65 - 99 mg/dL   BUN 30 (H) 6 - 20 mg/dL   Creatinine, Ser 1.64 (H) 0.61 - 1.24 mg/dL   Calcium 9.1 8.9 - 10.3 mg/dL   Total Protein 7.4 6.5 - 8.1 g/dL   Albumin 3.1 (L) 3.5 - 5.0 g/dL   AST 36 15 - 41 U/L   ALT 17 17 - 63 U/L   Alkaline Phosphatase 397 (H) 38 - 126 U/L   Total Bilirubin 1.5 (H) 0.3 - 1.2 mg/dL   GFR calc non Af Amer 37 (L) >60 mL/min   GFR calc Af Amer 43 (L) >60 mL/min   Anion gap 12 5 - 15  Urinalysis, Routine w reflex microscopic     Status: Abnormal   Collection Time: 04/10/17 12:57 AM  Result Value Ref Range   Color, Urine YELLOW YELLOW   APPearance CLEAR CLEAR   Specific Gravity, Urine 1.011 1.005 - 1.030   pH 5.0 5.0 - 8.0   Glucose, UA NEGATIVE NEGATIVE mg/dL   Hgb urine dipstick SMALL (A) NEGATIVE   Bilirubin Urine NEGATIVE NEGATIVE   Ketones, ur NEGATIVE NEGATIVE mg/dL   Protein, ur NEGATIVE NEGATIVE mg/dL   Nitrite NEGATIVE NEGATIVE   Leukocytes, UA NEGATIVE NEGATIVE   RBC / HPF 0-5 0 - 5 RBC/hpf   WBC, UA 0-5 0 - 5 WBC/hpf   Bacteria, UA NONE SEEN NONE SEEN   Squamous Epithelial / LPF NONE SEEN NONE SEEN   Mucus PRESENT    Hyaline Casts, UA PRESENT   CBC with Differential/Platelet  Status: Abnormal   Collection Time: 04/10/17 12:57 AM  Result Value Ref Range   WBC 7.5 4.0 - 10.5 K/uL   RBC 4.16 (L) 4.22 - 5.81  MIL/uL   Hemoglobin 11.4 (L) 13.0 - 17.0 g/dL   HCT 35.6 (L) 39.0 - 52.0 %   MCV 85.6 78.0 - 100.0 fL   MCH 27.4 26.0 - 34.0 pg   MCHC 32.0 30.0 - 36.0 g/dL   RDW 18.0 (H) 11.5 - 15.5 %   Platelets 216 150 - 400 K/uL   Neutrophils Relative % 81 %   Neutro Abs 6.0 1.7 - 7.7 K/uL   Lymphocytes Relative 13 %   Lymphs Abs 1.0 0.7 - 4.0 K/uL   Monocytes Relative 6 %   Monocytes Absolute 0.5 0.1 - 1.0 K/uL   Eosinophils Relative 0 %   Eosinophils Absolute 0.0 0.0 - 0.7 K/uL   Basophils Relative 0 %   Basophils Absolute 0.0 0.0 - 0.1 K/uL  Troponin I     Status: Abnormal   Collection Time: 04/10/17 12:57 AM  Result Value Ref Range   Troponin I 0.03 (HH) <0.03 ng/mL  I-Stat CG4 Lactic Acid, ED     Status: Abnormal   Collection Time: 04/10/17  1:11 AM  Result Value Ref Range   Lactic Acid, Venous 2.90 (HH) 0.5 - 1.9 mmol/L   Comment NOTIFIED PHYSICIAN   Rapid urine drug screen (hospital performed)     Status: None   Collection Time: 04/10/17  1:25 AM  Result Value Ref Range   Opiates NONE DETECTED NONE DETECTED   Cocaine NONE DETECTED NONE DETECTED   Benzodiazepines NONE DETECTED NONE DETECTED   Amphetamines NONE DETECTED NONE DETECTED   Tetrahydrocannabinol NONE DETECTED NONE DETECTED   Barbiturates NONE DETECTED NONE DETECTED  I-Stat CG4 Lactic Acid, ED     Status: Abnormal   Collection Time: 04/10/17  2:54 AM  Result Value Ref Range   Lactic Acid, Venous 2.12 (HH) 0.5 - 1.9 mmol/L   Comment NOTIFIED PHYSICIAN    Imaging Studies: Dg Chest 2 View  Result Date: 04/10/2017 CLINICAL DATA:  Altered mental status, possible lung infection EXAM: CHEST  2 VIEW COMPARISON:  04/06/2017 CT, 04/04/2017 FINDINGS: Left-sided pacing device as before. Similar appearance of upper lobe interstitial and alveolar opacity. Increasing opacity at the left greater than right lung base. Development of small pleural effusions. Mild cardiomegaly with aortic atherosclerosis. No pneumothorax. IMPRESSION: 1.  Development of small pleural effusions. 2. Similar appearance of biapical interstitial and alveolar opacity 3. Worsening atelectasis or infiltrate at the left greater than right lung base Electronically Signed   By: Donavan Foil M.D.   On: 04/10/2017 01:35   Ct Head Wo Contrast  Result Date: 04/10/2017 CLINICAL DATA:  Worsening confusion. History of hypertension, stroke, AICD. EXAM: CT HEAD WITHOUT CONTRAST TECHNIQUE: Contiguous axial images were obtained from the base of the skull through the vertex without intravenous contrast. COMPARISON:  CT HEAD November 16, 2016 FINDINGS: BRAIN: No intraparenchymal hemorrhage, mass effect nor midline shift. Moderate to severe parenchymal brain volume loss similar to prior examination, no hydrocephalus. Old bilateral basal ganglia lacunar infarcts. Confluent supratentorial white matter hypodensities. No acute large vascular territory infarcts. No abnormal extra-axial fluid collections. VASCULAR: Moderate calcific atherosclerosis of the carotid siphons. SKULL: No skull fracture. No significant scalp soft tissue swelling. SINUSES/ORBITS: The mastoid air-cells and included paranasal sinuses are well-aerated.The included ocular globes and orbital contents are non-suspicious. Status post bilateral ocular lens implants. OTHER: None. IMPRESSION:  1. No acute intracranial process. 2. Stable moderate to severe parenchymal brain volume loss. 3. Moderate to severe chronic small vessel ischemic disease and old lacunar infarcts. Electronically Signed   By: Elon Alas M.D.   On: 04/10/2017 02:23    ED COURSE  Nursing notes and initial vitals signs, including pulse oximetry, reviewed.  Vitals:   04/10/17 0045 04/10/17 0054 04/10/17 0101 04/10/17 0200  BP: 120/73   128/90  Pulse: 70   (!) 106  Resp: 20   (!) 21  Temp: (!) 97.4 F (36.3 C) 98.1 F (36.7 C)    TempSrc: Oral Rectal    SpO2: 100%   93%  Weight:   61.7 kg (136 lb)   Height:   5\' 7"  (1.702 m)    4:04  AM Lactic acid improved with hydration.  Will have TTS assess patient as I suspect early dementia.  4:52 AM TTS recommends Geri psych placement.  Borderline troponin likely due to known chronic congestive heart failure.  Patient is without acute chest complaint.  PROCEDURES    ED DIAGNOSES     ICD-10-CM   1. Abnormal behavior R46.89        Shanon Rosser, MD 04/10/17 [phone]

## 2017-04-10 NOTE — ED Notes (Signed)
Pt made an unsuccessful attempt to provide urine specimen.

## 2017-04-10 NOTE — ED Notes (Signed)
Bed: WA29 Expected date:  Expected time:  Means of arrival:  Comments: Room 16

## 2017-04-11 ENCOUNTER — Inpatient Hospital Stay (HOSPITAL_COMMUNITY): Payer: Medicare Other

## 2017-04-11 DIAGNOSIS — I340 Nonrheumatic mitral (valve) insufficiency: Secondary | ICD-10-CM

## 2017-04-11 DIAGNOSIS — G9341 Metabolic encephalopathy: Principal | ICD-10-CM

## 2017-04-11 LAB — CBC
HEMATOCRIT: 30.8 % — AB (ref 39.0–52.0)
HEMOGLOBIN: 9.8 g/dL — AB (ref 13.0–17.0)
MCH: 27.3 pg (ref 26.0–34.0)
MCHC: 31.8 g/dL (ref 30.0–36.0)
MCV: 85.8 fL (ref 78.0–100.0)
Platelets: 195 10*3/uL (ref 150–400)
RBC: 3.59 MIL/uL — AB (ref 4.22–5.81)
RDW: 18.2 % — ABNORMAL HIGH (ref 11.5–15.5)
WBC: 9.4 10*3/uL (ref 4.0–10.5)

## 2017-04-11 LAB — TROPONIN I
Troponin I: 0.03 ng/mL (ref <0.03)
Troponin I: 0.04 ng/mL (ref <0.03)

## 2017-04-11 LAB — BASIC METABOLIC PANEL
ANION GAP: 11 (ref 5–15)
BUN: 38 mg/dL — ABNORMAL HIGH (ref 6–20)
CHLORIDE: 111 mmol/L (ref 101–111)
CO2: 23 mmol/L (ref 22–32)
CREATININE: 1.67 mg/dL — AB (ref 0.61–1.24)
Calcium: 8.5 mg/dL — ABNORMAL LOW (ref 8.9–10.3)
GFR calc non Af Amer: 36 mL/min — ABNORMAL LOW (ref >60)
GFR, EST AFRICAN AMERICAN: 42 mL/min — AB (ref >60)
Glucose, Bld: 129 mg/dL — ABNORMAL HIGH (ref 65–99)
POTASSIUM: 3.5 mmol/L (ref 3.5–5.1)
Sodium: 145 mmol/L (ref 135–145)

## 2017-04-11 NOTE — Care Management Note (Signed)
Case Management Note  Patient Details  Name: Jacob [name] MRN: [id number] Date of Birth: December 01, 1931  Subjective/Objective: 83 y/o m admitted w/Acute metabolic encephalopathy,pna. From home. Noted-pscyh-gero psych. PT cons-await recc.CSW following.                   Action/Plan:d/c plan SNF.   Expected Discharge Date:                  Expected Discharge Plan:  Skilled Nursing Facility  In-House Referral:  Clinical Social Work  Discharge planning Services  CM Consult  Post Acute Care Choice:    Choice offered to:     DME Arranged:    DME Agency:     HH Arranged:    [place] Agency:     Status of Service:  In process, will continue to follow  If discussed at Long Length of Stay Meetings, dates discussed:    Additional Comments:  Dessa Phi, RN 04/11/2017, 12:04 PM

## 2017-04-11 NOTE — Progress Notes (Signed)
Patient with a 12 beat run of Vtach. Pt resting at the time. VSS. NP on call paged. No new orders placed. Will continue to monitor closely

## 2017-04-11 NOTE — Progress Notes (Signed)
Patient ID: Jacob [name], male   DOB: 12/21/1931, 83 y.o.   MRN: [id number]  PROGRESS NOTE    Jacob [name]  QMV:[id number] DOB: 30-Jan-1932 DOA: 04/10/2017  PCP: Binnie Rail, MD   Brief Narrative:  83 year old male with history of BPH, esophageal strictures, presented to ED with weakness and confusion. Patient was recently diagnosed and treated for pneumonia with doxycycline and started 04/04/2017. Because of altered mental status he was brought to ED for further evaluation. Chest x-ray showed possible pneumonia on the left side. CT head showed no acute intracranial findings. Patient was started on empiric doxycycline.   Assessment & Plan:   Principal Problem:   Acute metabolic encephalopathy - likely related to pneumonia - CT head negative for acute intracranial findings - Obtain PT evaluation  Active Problems:   Lobar pneumonia, unspecified organism - Patient was started on empiric doxycycline - blood cultures pending    Mild troponin elevation - Likely demand ischemia due to acute infection, hypoxia and acute kidney injury - No reports of chest pain - Had some PVC's on 12 lead EKG but no acute ischemic changes      CAD - Continue dabigatran and aspirin     CKD stage 3 - Cr 1.8 at baseline in 09/2015 - Cr within baseline range on this admission     Anemia of CKD - Hgb stable     DVT prophylaxis: SCD's, daibigatran  Code Status: DNR/DNI  Family Communication: no family at the bedside this am Disposition Plan: not yet stable for discharge    Consultants:   PT  Procedures:   None   Antimicrobials:   None     Subjective: No overnight events.  Objective: Vitals:   04/10/17 1617 04/10/17 2014 04/11/17 0546 04/11/17 0719  BP:  114/70 133/82 123/76  Pulse:  89 85 89  Resp:  18 18 16   Temp:  (!) 97.5 F (36.4 C) (!) 97.5 F (36.4 C) (!) 97.5 F (36.4 C)  TempSrc:  Oral Oral Axillary  SpO2:  98% 98% 99%  Weight: 61.6 kg (135 lb 14.4 oz)  61 kg  (134 lb 7.7 oz)   Height:        Intake/Output Summary (Last 24 hours) at 04/11/17 1032 Last data filed at 04/11/17 0943  Gross per 24 hour  Intake          2063.33 ml  Output              350 ml  Net          1713.33 ml   Filed Weights   04/10/17 1500 04/10/17 1617 04/11/17 0546  Weight: 61.9 kg (136 lb 7.4 oz) 61.6 kg (135 lb 14.4 oz) 61 kg (134 lb 7.7 oz)    Examination:  General exam: Appears in no distress  Respiratory system: diminished breath sounds, no wheezing  Cardiovascular system: S1 & S2 heard, tachycardic  Gastrointestinal system: Abdomen is nondistended, soft and nontender. No organomegaly or masses felt. Normal bowel sounds heard. Central nervous system: Alert but appears restless Extremities: Symmetric 5 x 5 power. Skin: No rashes, lesions or ulcers Psychiatry: restless but not agitated   Data Reviewed: I have personally reviewed following labs and imaging studies  CBC:  Recent Labs Lab 04/04/17 1641 04/10/17 0057 04/10/17 2359  WBC 6.3 7.5 9.4  NEUTROABS 4.7 6.0  --   HGB 11.3* 11.4* 9.8*  HCT 35.4* 35.6* 30.8*  MCV 84.6 85.6 85.8  PLT 230.0 216 195  Basic Metabolic Panel:  Recent Labs Lab 04/04/17 1641 04/10/17 0057 04/10/17 1518 04/10/17 2359  NA 142 142  --  145  K 3.5 3.8  --  3.5  CL 106 106  --  111  CO2 29 24  --  23  GLUCOSE 136* 136*  --  129*  BUN 18 30*  --  38*  CREATININE 1.21 1.64*  --  1.67*  CALCIUM 9.0 9.1  --  8.5*  MG  --   --  1.9  --   PHOS  --   --  3.7  --    GFR: Estimated Creatinine Clearance: 28.9 mL/min (A) (by C-G formula based on SCr of 1.67 mg/dL (H)). Liver Function Tests:  Recent Labs Lab 04/04/17 1641 04/10/17 0057  AST 11 36  ALT 5 17  ALKPHOS 397* 397*  BILITOT 0.8 1.5*  PROT 7.0 7.4  ALBUMIN 3.1* 3.1*   No results for input(s): LIPASE, AMYLASE in the last 168 hours. No results for input(s): AMMONIA in the last 168 hours. Coagulation Profile: No results for input(s): INR, PROTIME in  the last 168 hours. Cardiac Enzymes:  Recent Labs Lab 04/10/17 0057 04/10/17 1210 04/10/17 1827 04/10/17 2359 04/11/17 0620  TROPONINI 0.03* 0.03* <0.03 0.03* 0.04*   BNP (last 3 results) No results for input(s): PROBNP in the last 8760 hours. HbA1C: No results for input(s): HGBA1C in the last 72 hours. CBG:  Recent Labs Lab 04/10/17 0050  GLUCAP 124*   Lipid Profile: No results for input(s): CHOL, HDL, LDLCALC, TRIG, CHOLHDL, LDLDIRECT in the last 72 hours. Thyroid Function Tests: No results for input(s): TSH, T4TOTAL, FREET4, T3FREE, THYROIDAB in the last 72 hours. Anemia Panel: No results for input(s): VITAMINB12, FOLATE, FERRITIN, TIBC, IRON, RETICCTPCT in the last 72 hours. Urine analysis:    Component Value Date/Time   COLORURINE YELLOW 04/10/2017 [place] 04/10/2017 0057   LABSPEC 1.011 04/10/2017 0057   PHURINE 5.0 04/10/2017 0057   GLUCOSEU NEGATIVE 04/10/2017 0057   GLUCOSEU NEGATIVE 05/19/2011 1315   HGBUR SMALL (A) 04/10/2017 0057   BILIRUBINUR NEGATIVE 04/10/2017 0057   BILIRUBINUR negative 01/03/2017 1531   BILIRUBINUR Neg 12/07/2016   KETONESUR NEGATIVE 04/10/2017 0057   PROTEINUR NEGATIVE 04/10/2017 0057   UROBILINOGEN negative (A) 01/03/2017 1531   UROBILINOGEN 0.2 12/02/2011 2131   NITRITE NEGATIVE 04/10/2017 0057   LEUKOCYTESUR NEGATIVE 04/10/2017 0057   Sepsis Labs: @LABRCNTIP (procalcitonin:4,lacticidven:4)   Recent Results (from the past 240 hour(s))  Culture, blood (Routine X 2) w Reflex to ID Panel     Status: None (Preliminary result)   Collection Time: 04/10/17 12:05 PM  Result Value Ref Range Status   Specimen Description BLOOD LEFT ARM  Final   Special Requests   Final    BOTTLES DRAWN AEROBIC AND ANAEROBIC Blood Culture adequate volume Performed at [facility], [place] [address], [place], [place] [address]    Culture PENDING  Incomplete   Report Status PENDING  Incomplete      Radiology Studies: Dg  Chest 2 View Result Date: 04/10/2017 1. Development of small pleural effusions. 2. Similar appearance of biapical interstitial and alveolar opacity 3. Worsening atelectasis or infiltrate at the left greater than right lung base   Ct Head Wo Contrast Result Date: 04/10/2017 1. No acute intracranial process. 2. Stable moderate to severe parenchymal brain volume loss. 3. Moderate to severe chronic small vessel ischemic disease and old lacunar infarcts.    Scheduled Meds: . aspirin  324 mg Oral Daily  . dabigatran  150 mg Oral BID  . doxycycline  100 mg Oral BID  . DULoxetine  60 mg Oral Daily  . feeding supplement (ENSURE ENLIVE)  237 mL Oral BID BM  . finasteride  5 mg Oral Daily  . isosorbide mononitrate  30 mg Oral Daily  . metoprolol tartrate  25 mg Oral BID  . multivitamin with minerals  1 tablet Oral Daily  . pantoprazole  40 mg Oral Daily  . rosuvastatin  10 mg Oral QHS   Continuous Infusions:   LOS: 1 day    Time spent: 25 minutes  Greater than 50% of the time spent on counseling and coordinating the care.   Leisa Lenz, MD [facility] Pager [phone]  If 7PM-7AM, please contact night-coverage [email or web address] Password [email or web address] 04/11/2017, 10:32 AM

## 2017-04-11 NOTE — Evaluation (Signed)
Physical Therapy Evaluation Patient Details Name: Jacob [name] MRN: [id number] DOB: 12/22/1931 Today's Date: 04/11/2017   History of Present Illness  Jacob [name] is a 83 y.o. male past medical history significant for BPH, CHF, CAD, TIA's HTN,esophageal stricture presented to the emergency room for weakness and confusion, found outside trying to climb into a window.. CT head negative. Has been treated for PNA recently  Clinical Impression  The patient was able to mobilize to standing and taking a few steps with RW. No family present  For DC planning, patient unable. Pt admitted with above diagnosis. Pt currently with functional limitations due to the deficits listed below (see PT Problem List).  Pt will benefit from skilled PT to increase their independence and safety with mobility to allow discharge to the venue listed below.       Follow Up Recommendations SNF    Equipment Recommendations  None recommended by PT    Recommendations for Other Services       Precautions / Restrictions Precautions Precautions: Fall      Mobility  Bed Mobility Overal bed mobility: Needs Assistance Bed Mobility: Rolling;Supine to Sit;Sit to Supine Rolling: Independent   Supine to sit: Mod assist Sit to supine: Min guard   General bed mobility comments:  assist legs over bed edge and assist trunk to sit upright  Transfers Overall transfer level: Needs assistance Equipment used: Rolling walker (2 wheeled) Transfers: Sit to/from Stand Sit to Stand: Mod assist         General transfer comment: multimodal cues  to stand. Assist to power up to stand. performed x 2.  Ambulation/Gait Ambulation/Gait assistance: Mod assist   Assistive device: Rolling walker (2 wheeled) Gait Pattern/deviations: Leaning posteriorly     General Gait Details: side steps x 4 along the bed, Steady assist to balance at [employer] Mobility    Modified Rankin (Stroke Patients  Only)       Balance Overall balance assessment: Needs assistance Sitting-balance support: Feet supported;Bilateral upper extremity supported Sitting balance-Leahy Scale: Poor   Postural control: Posterior lean Standing balance support: Bilateral upper extremity supported;During functional activity Standing balance-Leahy Scale: Poor Standing balance comment: leans posteriorly                             Pertinent Vitals/Pain Pain Assessment: No/denies pain    Home Living Family/patient expects to be discharged to:: Unsure                 Additional Comments: no family present, patient unable to provide    Prior Function           Comments: apparantly ambulatory being that he was found outside     Hand Dominance        Extremity/Trunk Assessment   Upper Extremity Assessment Upper Extremity Assessment: Generalized weakness    Lower Extremity Assessment Lower Extremity Assessment: Generalized weakness       Communication   Communication: HOH  Cognition Arousal/Alertness: Awake/alert Behavior During Therapy: WFL for tasks assessed/performed Overall Cognitive Status: No family/caregiver present to determine baseline cognitive functioning Area of Impairment: Orientation;Following commands                 Orientation Level: Place;Time;Situation     Following Commands: Follows one step commands inconsistently       General Comments: able to give name and BD,  General Comments      Exercises     Assessment/Plan    PT Assessment Patient needs continued PT services  PT Problem List Decreased strength;Decreased activity tolerance;Decreased balance;Decreased mobility;Decreased knowledge of precautions;Decreased safety awareness;Decreased knowledge of use of DME;Pain;Decreased cognition       PT Treatment Interventions DME instruction;Gait training;Functional mobility training;Therapeutic activities;Therapeutic  exercise;Patient/family education    PT Goals (Current goals can be found in the Care Plan section)  Acute Rehab PT Goals PT Goal Formulation: Patient unable to participate in goal setting Time For Goal Achievement: 04/25/17 Potential to Achieve Goals: Fair    Frequency Min 2X/week   Barriers to discharge Decreased caregiver support      Co-evaluation               AM-PAC PT "6 Clicks" Daily Activity  Outcome Measure Difficulty turning over in bed (including adjusting bedclothes, sheets and blankets)?: Unable Difficulty moving from lying on back to sitting on the side of the bed? : Unable Difficulty sitting down on and standing up from a chair with arms (e.g., wheelchair, bedside commode, etc,.)?: Unable Help needed moving to and from a bed to chair (including a wheelchair)?: Total Help needed walking in hospital room?: Total Help needed climbing 3-5 steps with a railing? : Total 6 Click Score: 6    End of Session Equipment Utilized During Treatment: Gait belt Activity Tolerance: Patient tolerated treatment well Patient left: in bed;with bed alarm set;with call bell/phone within reach Nurse Communication: Mobility status PT Visit Diagnosis: Unsteadiness on feet (R26.81)    Time: 1209-1218 PT Time Calculation (min) (ACUTE ONLY): 9 min   Charges:   PT Evaluation $PT Eval Low Complexity: 1 Low     PT G CodesTresa Endo PT [phone]   Claretha Cooper 04/11/2017, 12:25 PM

## 2017-04-11 NOTE — Progress Notes (Signed)
Initial Nutrition Assessment  DOCUMENTATION CODES:   Non-severe (moderate) malnutrition in context of acute illness/injury  INTERVENTION:  - Continue Ensure Enlive po BID, each supplement provides 350 kcal and 20 grams of protein - Tech/RN to continue to assist with meals and provide encouragement for PO intakes. - RD will monitor for additional nutrition-related needs.   NUTRITION DIAGNOSIS:   Moderate Malnutrition related to acute illness (PNA) as evidenced by percent weight loss, moderate muscle depletion, severe fat depletion.  GOAL:   Patient will meet greater than or equal to 90% of their needs  MONITOR:   PO intake, Supplement acceptance, Weight trends, Labs  REASON FOR ASSESSMENT:   Malnutrition Screening Tool  ASSESSMENT:   83 y.o. male past medical history significant for BPH, CHF, esophageal stricture presented to the emergency room for weakness and confusion.  Patient was recently diagnosed and being treated for pneumonia with doxycycline started on 10/22.    Pt seen for MST. BMI indicates normal weight. No intakes documented since admission. Lunch tray in front of pt, he had only consumed 1 bite of meatloaf. Provided encouragement and provided assistance to pt with meal but he declined as he was not hungry. He denied abdominal pain or nausea at this time. He was unable to provide any other pertinent information at this time. No family/visitors present. Flow sheet indicates that pt is a/o to self only and H&P states that pt will need geri-psych placement when stable for d/c.  Per chart review, he has lost 7 lbs (5% body weight) in the past 1 month; significant for time frame. He has also lost 22 lbs (14% body weight) in the past 4.5 months; significant for time frame.   Medications reviewed; daily multivitamin with minerals, 40 mg oral Protonix/day.  Labs reviewed; BUN: 38 mg/dL, creatinine: 1.67 mg/dL, Ca: 8.5 mg/dL, GFR: 36 mL/min.     NUTRITION - FOCUSED PHYSICAL  EXAM:    Most Recent Value  Orbital Region  No depletion  Upper Arm Region  Severe depletion  Thoracic and Lumbar Region  Unable to assess  Buccal Region  Mild depletion  Temple Region  No depletion  Clavicle Bone Region  No depletion  Clavicle and Acromion Bone Region  Moderate depletion  Scapular Bone Region  Unable to assess  Dorsal Hand  Mild depletion  Patellar Region  No depletion  Anterior Thigh Region  Unable to assess  Posterior Calf Region  Mild depletion  Edema (RD Assessment)  None  Hair  Reviewed  Eyes  Reviewed  Mouth  Reviewed  Skin  Reviewed  Nails  Reviewed       Diet Order:  Diet regular Room service appropriate? Yes; Fluid consistency: Thin  EDUCATION NEEDS:   No education needs have been identified at this time  Skin:  Skin Assessment: Reviewed RN Assessment  Last BM:  PTA/unknown  Height:   Ht Readings from Last 1 Encounters:  04/10/17 5\' 7"  (1.702 m)    Weight:   Wt Readings from Last 1 Encounters:  04/11/17 134 lb 7.7 oz (61 kg)    Ideal Body Weight:  67.27 kg  BMI:  Body mass index is 21.06 kg/m.  Estimated Nutritional Needs:   Kcal:  1400-1650 (23-27 kcal/kg)  Protein:  61-73 grams (1-1.2 grams/kg)  Fluid:  >/= 1.6 L/day     Jarome Matin, MS, RD, LDN, [facility] Inpatient Clinical Dietitian Pager # [phone] After hours/weekend pager # [phone]

## 2017-04-11 NOTE — Progress Notes (Signed)
Patient with a 5 beat run of vtach. VSS. Patient is drowsy, but responds to voice. MD paged. Will make oncoming nurse aware.

## 2017-04-11 NOTE — Progress Notes (Signed)
  Echocardiogram 2D Echocardiogram has been performed.  Joyanna Kleman T Mckenze Slone 04/11/2017, 2:55 PM

## 2017-04-12 DIAGNOSIS — F4324 Adjustment disorder with disturbance of conduct: Secondary | ICD-10-CM

## 2017-04-12 DIAGNOSIS — E46 Unspecified protein-calorie malnutrition: Secondary | ICD-10-CM

## 2017-04-12 DIAGNOSIS — F332 Major depressive disorder, recurrent severe without psychotic features: Secondary | ICD-10-CM

## 2017-04-12 DIAGNOSIS — R140 Abdominal distension (gaseous): Secondary | ICD-10-CM

## 2017-04-12 DIAGNOSIS — Z87891 Personal history of nicotine dependence: Secondary | ICD-10-CM

## 2017-04-12 DIAGNOSIS — R45851 Suicidal ideations: Secondary | ICD-10-CM

## 2017-04-12 LAB — BASIC METABOLIC PANEL
Anion gap: 14 (ref 5–15)
BUN: 44 mg/dL — AB (ref 6–20)
CHLORIDE: 111 mmol/L (ref 101–111)
CO2: 23 mmol/L (ref 22–32)
CREATININE: 1.64 mg/dL — AB (ref 0.61–1.24)
Calcium: 8.7 mg/dL — ABNORMAL LOW (ref 8.9–10.3)
GFR calc Af Amer: 43 mL/min — ABNORMAL LOW (ref >60)
GFR calc non Af Amer: 37 mL/min — ABNORMAL LOW (ref >60)
GLUCOSE: 116 mg/dL — AB (ref 65–99)
Potassium: 3.8 mmol/L (ref 3.5–5.1)
SODIUM: 148 mmol/L — AB (ref 135–145)

## 2017-04-12 LAB — CBC
HCT: 33.9 % — ABNORMAL LOW (ref 39.0–52.0)
HEMOGLOBIN: 10.5 g/dL — AB (ref 13.0–17.0)
MCH: 27 pg (ref 26.0–34.0)
MCHC: 31 g/dL (ref 30.0–36.0)
MCV: 87.1 fL (ref 78.0–100.0)
Platelets: 209 10*3/uL (ref 150–400)
RBC: 3.89 MIL/uL — ABNORMAL LOW (ref 4.22–5.81)
RDW: 18.8 % — ABNORMAL HIGH (ref 11.5–15.5)
WBC: 8.6 10*3/uL (ref 4.0–10.5)

## 2017-04-12 MED ORDER — SODIUM CHLORIDE 0.9 % IV SOLN
INTRAVENOUS | Status: DC
  Administered 2017-04-12: 11:00:00 via INTRAVENOUS

## 2017-04-12 MED ORDER — DOXYCYCLINE HYCLATE 100 MG IV SOLR
100.0000 mg | Freq: Two times a day (BID) | INTRAVENOUS | Status: DC
  Administered 2017-04-12 (×2): 100 mg via INTRAVENOUS
  Filled 2017-04-12 (×3): qty 100

## 2017-04-12 MED ORDER — MIRTAZAPINE 15 MG PO TBDP
15.0000 mg | ORAL_TABLET | Freq: Every day | ORAL | Status: DC
  Administered 2017-04-12 – 2017-04-18 (×6): 15 mg via ORAL
  Filled 2017-04-12 (×8): qty 1

## 2017-04-12 NOTE — NC FL2 (Signed)
[place] LEVEL OF CARE SCREENING TOOL     IDENTIFICATION  Patient Name: Jacob [name] Birthdate: 06/03/1932 Sex: male Admission Date (Current Location): 04/10/2017  [facility] and [place] Number:  Herbalist and Address:  [facility],  [address], [place]      Provider Number: [id number]  Attending Physician Name and Address:  Robbie Lis, MD  Relative Name and Phone Number:       Current Level of Care: Hospital Recommended Level of Care: [place] Prior Approval Number:    Date Approved/Denied:   PASRR Number: [id number]  Discharge Plan: SNF    Current Diagnoses: Patient Active Problem List   Diagnosis Date Noted  . Adjustment disorder with disturbance of conduct 04/10/2017  . AKI (acute kidney injury) ([place]) 04/10/2017  . CAP (community acquired pneumonia) 04/10/2017  . Acute metabolic encephalopathy 10/93/2355  . Malnutrition ([place]) 04/04/2017  . Abdominal distension (gaseous) 04/04/2017  . Shortness of breath 04/04/2017  . Weight loss 02/22/2017  . Cough 02/22/2017  . Right hip pain 02/22/2017  . Acute pain of left shoulder 01/03/2017  . Encephalopathy 11/18/2016  . Dehydration   . Low vitamin B12 level   . Low folate   . Acute encephalopathy 11/16/2016  . Preoperative clearance 10/21/2016  . Anemia 02/23/2016  . Aspiration pneumonitis ([place]) 02/15/2016  . CKD (chronic kidney disease), stage III ([place]) 02/15/2016  . Atrial fibrillation with RVR ([place])   . Cardiomyopathy, ischemic   . Chronic systolic CHF (congestive heart failure) ([place]) 10/11/2015  . Diplopia 10/11/2015  . Depression 07/07/2015  . Esophageal obstruction due to food impaction 01/24/2014  . Syncope 02/22/2013  . CAD (coronary artery disease) 02/22/2013  . GERD (gastroesophageal reflux disease) 02/22/2013  . OA (osteoarthritis) of knee 02/22/2013  . TIA (transient ischemic attack) 11/07/2011  . Urinary retention 04/13/2011   . NEVUS, ATYPICAL 08/26/2010  . ECZEMA 05/27/2010  . Atrial fibrillation ([place]) 04/29/2010  . ROTATOR CUFF INJURY, RIGHT SHOULDER 09/16/2009  . Essential hypertension 04/23/2009  . BENIGN PROSTATIC HYPERTROPHY, WITH OBSTRUCTION 04/23/2009  . ARTHRITIS 04/23/2009  . HYPERCHOLESTEROLEMIA, MIXED 04/22/2008  . Automatic implantable cardioverter-defibrillator in situ 04/22/2008    Orientation RESPIRATION BLADDER Height & Weight     Self  Normal Incontinent Weight: 138 lb 3.7 oz (62.7 kg) Height:  5\' 7"  (170.2 cm)  BEHAVIORAL SYMPTOMS/MOOD NEUROLOGICAL BOWEL NUTRITION STATUS        Diet (regular)  AMBULATORY STATUS COMMUNICATION OF NEEDS Skin   Extensive Assist Verbally Normal                       Personal Care Assistance Level of Assistance  Bathing, Feeding, Dressing Bathing Assistance: Maximum assistance Feeding assistance: Limited assistance Dressing Assistance: Maximum assistance     Functional Limitations Info  Hearing, Speech, Sight Sight Info: Adequate Hearing Info: Impaired Speech Info: Adequate    SPECIAL CARE FACTORS FREQUENCY  PT (By licensed PT), OT (By licensed OT)     PT Frequency: 5x OT Frequency: 5x            Contractures Contractures Info: Not present    Additional Factors Info  Code Status, Allergies Code Status Info: DNR Allergies Info: Bactrim Sulfamethoxazole-trimethoprim;Penicillins           Current Medications (04/12/2017):  This is the current hospital active medication list Current Facility-Administered Medications  Medication Dose Route Frequency Provider Last Rate Last Dose  . 0.9 %  sodium chloride infusion   Intravenous Continuous Robbie Lis, MD 100 mL/hr at 04/12/17 1035    . acetaminophen (TYLENOL) tablet 650 mg  650 mg Oral Q6H PRN Elwin Mocha, MD       Or  . acetaminophen (TYLENOL) suppository 650 mg  650 mg Rectal Q6H PRN Elwin Mocha, MD      . aspirin chewable tablet 324 mg  324 mg Oral Daily Elwin Mocha, MD   324 mg at 04/12/17 1035  . dabigatran (PRADAXA) capsule 150 mg  150 mg Oral BID Molpus, John, MD   150 mg at 04/12/17 1036  . doxycycline (VIBRAMYCIN) 100 mg in dextrose 5 % 250 mL IVPB  100 mg Intravenous Q12H Robbie Lis, MD 125 mL/hr at 04/12/17 1035 100 mg at 04/12/17 1035  . DULoxetine (CYMBALTA) DR capsule 60 mg  60 mg Oral Daily Molpus, John, MD   60 mg at 04/12/17 1035  . feeding supplement (ENSURE ENLIVE) (ENSURE ENLIVE) liquid 237 mL  237 mL Oral BID BM Molpus, John, MD   237 mL at 04/12/17 1034  . finasteride (PROSCAR) tablet 5 mg  5 mg Oral Daily Molpus, John, MD   5 mg at 04/12/17 1036  . HYDROcodone-acetaminophen (NORCO) 10-325 MG per tablet 1 tablet  1 tablet Oral Q6H PRN Absher, Julieta Bellini, RPH   1 tablet at 04/11/17 0810  . Influenza vac split quadrivalent PF (FLUZONE HIGH-DOSE) injection 0.5 mL  0.5 mL Intramuscular Tomorrow-1000 Elwin Mocha, MD      . isosorbide mononitrate (IMDUR) 24 hr tablet 30 mg  30 mg Oral Daily Molpus, John, MD   30 mg at 04/12/17 1036  . metoprolol tartrate (LOPRESSOR) tablet 25 mg  25 mg Oral BID Molpus, John, MD   25 mg at 04/12/17 1036  . multivitamin with minerals tablet 1 tablet  1 tablet Oral Daily Molpus, John, MD   1 tablet at 04/12/17 1036  . nitroGLYCERIN (NITROSTAT) SL tablet 0.4 mg  0.4 mg Sublingual Q5 min PRN Elwin Mocha, MD      . ondansetron [facility]) tablet 4 mg  4 mg Oral Q6H PRN Elwin Mocha, MD       Or  . ondansetron [facility]) injection 4 mg  4 mg Intravenous Q6H PRN Elwin Mocha, MD      . pantoprazole (PROTONIX) EC tablet 40 mg  40 mg Oral Daily Molpus, John, MD   40 mg at 04/12/17 1036  . rosuvastatin (CRESTOR) tablet 10 mg  10 mg Oral QHS Molpus, John, MD   10 mg at 04/11/17 2131     Discharge Medications: Please see discharge summary for a list of discharge medications.  Relevant Imaging Results:  Relevant Lab Results:   Additional Information SSN   [id number]  Burnis Medin,  LCSW

## 2017-04-12 NOTE — Consult Note (Signed)
[facility] Consult   Reason for Consult: Depression and history of altered mental status Referring Physician:  Dr. Charlies Silvers Patient Identification: Jacob [name] MRN:  [id number] Principal Diagnosis: Acute metabolic encephalopathy Diagnosis:   Patient Active Problem List   Diagnosis Date Noted  . Adjustment disorder with disturbance of conduct [F43.24] 04/10/2017  . AKI (acute kidney injury) ([place]) [N17.9] 04/10/2017  . CAP (community acquired pneumonia) [J18.9] 04/10/2017  . Acute metabolic encephalopathy [J69.67] 04/10/2017  . Malnutrition ([place]) [E46] 04/04/2017  . Abdominal distension (gaseous) [R14.0] 04/04/2017  . Shortness of breath [R06.02] 04/04/2017  . Weight loss [R63.4] 02/22/2017  . Cough [R05] 02/22/2017  . Right hip pain [M25.551] 02/22/2017  . Acute pain of left shoulder [M25.512] 01/03/2017  . Encephalopathy [G93.40] 11/18/2016  . Dehydration [E86.0]   . Low vitamin B12 level [E53.8]   . Low folate [E53.8]   . Acute encephalopathy [G93.40] 11/16/2016  . Preoperative clearance [Z01.818] 10/21/2016  . Anemia [D64.9] 02/23/2016  . Aspiration pneumonitis ([place]) [J69.0] 02/15/2016  . CKD (chronic kidney disease), stage III ([place]) [N18.3] 02/15/2016  . Atrial fibrillation with RVR ([place]) [I48.91]   . Cardiomyopathy, ischemic [I25.5]   . Chronic systolic CHF (congestive heart failure) ([place]) [I50.22] 10/11/2015  . Diplopia [H53.2] 10/11/2015  . Depression [F32.9] 07/07/2015  . Esophageal obstruction due to food impaction [K22.2, T18.128A] 01/24/2014  . Syncope [R55] 02/22/2013  . CAD (coronary artery disease) [I25.10] 02/22/2013  . GERD (gastroesophageal reflux disease) [K21.9] 02/22/2013  . OA (osteoarthritis) of knee [M17.10] 02/22/2013  . TIA (transient ischemic attack) [G45.9] 11/07/2011  . Urinary retention [R33.9] 04/13/2011  . NEVUS, ATYPICAL [D23.9] 08/26/2010  . ECZEMA [L25.9] 05/27/2010  . Atrial fibrillation ([place]) [I48.91] 04/29/2010  .  ROTATOR CUFF INJURY, RIGHT SHOULDER [M71.9, M67.919] 09/16/2009  . Essential hypertension [I10] 04/23/2009  . BENIGN PROSTATIC HYPERTROPHY, WITH OBSTRUCTION [N40.1] 04/23/2009  . ARTHRITIS [M12.9] 04/23/2009  . HYPERCHOLESTEROLEMIA, MIXED [E78.00] 04/22/2008  . Automatic implantable cardioverter-defibrillator in situ [Z95.810] 04/22/2008    Total Time spent with patient: 1 hour  Subjective:   Jacob [name] is a 83 y.o. male patient admitted with AMS.  HPI: Jacob [name] is a 83 years old male with a history of BPH, esophageal strictures presented with confusion and generalized weakness.  Patient seen face-to-face for this evaluation and also case discussed with his family members who are at bedside with patient consent.  Patient reported he has been depressed, not sleeping well, no physical activity, isolation, withdrawn refusing to take his oral medication and even eating his food.  Reportedly patient wife died 2 years ago and has been seeing primary care physician who has been treating with his antidepressant medication Cymbalta which is to titrated up to 60 mg a day.  Patient has been suffered with a pneumonia recently which was treated with antibiotic therapy including doxycycline . Patient daughter reported until a few weeks ago he is able to care for himself and take his medication eating and sleeping fine even though has been depressed and talking about his wife who passed away 2 years ago.  Past Psychiatric History: Patient has been depressed over the loss of his 5 for the last 2 years and has received outpatient antidepressant medication her primary care physician but no history of acute psychiatric hospitalization or psychiatric treatment.    Risk to Self: Suicidal Ideation: No Suicidal Intent: No Is patient at risk for suicide?: No Suicidal Plan?: No Access to Means: No What has been your use of drugs/alcohol  within the last 12 months?: denies use  Triggers for Past Attempts: None  known Intentional Self Injurious Behavior: None Risk to Others: Homicidal Ideation: No Thoughts of Harm to Others: No Current Homicidal Intent: No Current Homicidal Plan: No Access to Homicidal Means: No History of harm to others?: No Assessment of Violence: None Noted Does patient have access to weapons?: Yes (Comment) (pt owns 2 guns that he reports are locked up ) Criminal Charges Pending?: No Does patient have a court date: No Prior Inpatient Therapy: Prior Inpatient Therapy: No Prior Outpatient Therapy: Prior Outpatient Therapy: No Does patient have an ACCT team?: No Does patient have Intensive In-House Services?  : No Does patient have [facility] services? : No Does patient have P4CC services?: No  Past Medical History:  Past Medical History:  Diagnosis Date  . AICD (automatic cardioverter/defibrillator) present   . Arthritis   . Atrial fibrillation ([place])   . Benign prostatic hypertrophy   . BPH (benign prostatic hyperplasia)   . CHF (congestive heart failure) ([place])   . Chronic systolic heart failure (HCC)    a. EF 25-30% in past; b.Echo 11/08/11: Normal LV wall thickness, EF 45-50%. c. Echo 04/17: EF 15-20%, diffuse akinesis, moderate MR, mod dilated LA, trivial PE   . Coronary artery disease    a. Chronically occ RCA. cardiac cath 10/2010: Mid LAD 20-30%, distal LAD 40%, mid D1 40%, mid circumflex 20-30%, distal RCA occluded with left to right collaterals. c. 10/2015: cath- relatively stable dz since 2012.    . Depression   . Esophageal stricture   . GERD (gastroesophageal reflux disease)   . Hemorrhoids   . History of diverticulitis of colon   . History of kidney stones   . History of nephrolithiasis   . HLD (hyperlipidemia)   . HTN (hypertension)   . Ischemic cardiomyopathy    a. s/p ICD 2008   . Myocardial infarction ([place])   . Pneumonia   . Renal insufficiency    Renal failure in July 2012 secondary to hypotension, ARB use, and   . Skin cancer    skin neck  .  Stroke ([place])    tia's  . TIA (transient ischemic attack) 10/2011   carotids neg for ICA stenosis  . Urinary retention    foley indwelling 12/2010-04/2011    Past Surgical History:  Procedure Laterality Date  . CARDIAC CATHETERIZATION  11-05-10  . CARDIAC CATHETERIZATION N/A 10/14/2015   Procedure: Right/Left Heart Cath and Coronary Angiography;  Surgeon: Leonie Man, MD;  Location: [facility];  Service: Cardiovascular;  Laterality: N/A;  . CARDIAC DEFIBRILLATOR PLACEMENT  12/2006   AICD  . CATARACTS REMOVED    . EP IMPLANTABLE DEVICE N/A 11/11/2015   Procedure:  ICD Generator Changeout;  Surgeon: Evans Lance, MD;  Location: [facility];  Service: Cardiovascular;  Laterality: N/A;  . ESOPHAGOGASTRODUODENOSCOPY N/A 01/24/2014   Procedure: ESOPHAGOGASTRODUODENOSCOPY (EGD);  Surgeon: Milus Banister, MD;  Location: [facility];  Service: Endoscopy;  Laterality: N/A;  . ESOPHAGOGASTRODUODENOSCOPY N/A 02/14/2016   Procedure: ESOPHAGOGASTRODUODENOSCOPY (EGD);  Surgeon: Mauri Pole, MD;  Location: [facility];  Service: Endoscopy;  Laterality: N/A;  . JOINT REPLACEMENT    . TOTAL KNEE ARTHROPLASTY  01/04/11   L TKA  . TRANSURETHRAL RESECTION OF PROSTATE  06/14/2011   Procedure: TRANSURETHRAL RESECTION OF THE PROSTATE (TURP);  Surgeon: Bernestine Amass, MD;  Location: [facility];  Service: Urology;  Laterality: N/A;   Family History:  Family  History  Problem Relation Age of Onset  . Heart attack Mother 7       pt unsure of age  . Liver disease Father 48  . Heart disease Son        x 2  . Colon cancer Neg Hx    Family Psychiatric  History: Patient lives with his daughter and son and their children at home. Social History:  History  Alcohol Use No     History  Drug Use No    Social History   Social History  . Marital status: Married    Spouse name: N/A  . Number of children: N/A  . Years of education: N/A   Occupational History  . retired    Social History  Main Topics  . Smoking status: Former Smoker    Packs/day: 2.00    Years: 30.00    Quit date: 06/14/1968  . Smokeless tobacco: Never Used  . Alcohol use No  . Drug use: No  . Sexual activity: Not Currently   Other Topics Concern  . None   Social History Narrative   Daily caffeine    Additional Social History:    Allergies:   Allergies  Allergen Reactions  . Bactrim [Sulfamethoxazole-Trimethoprim] Other (See Comments)    Throat swelling  . Penicillins Itching, Swelling and Rash    Has patient had a PCN reaction causing immediate rash, facial/tongue/throat swelling, SOB or lightheadedness with hypotension:unsure Has patient had a PCN reaction causing severe rash involving mucus membranes or skin necrosis:No Has patient had a PCN reaction that required hospitalization:No Has patient had a PCN reaction occurring within the last 10 years:No If all of the above answers are "NO", then may proceed with Cephalosporin use.     Labs:  Results for orders placed or performed during the hospital encounter of 04/10/17 (from the past 48 hour(s))  Culture, blood (Routine X 2) w Reflex to ID Panel     Status: None (Preliminary result)   Collection Time: 04/10/17 12:05 PM  Result Value Ref Range   Specimen Description BLOOD LEFT ARM    Special Requests      BOTTLES DRAWN AEROBIC AND ANAEROBIC Blood Culture adequate volume   Culture      NO GROWTH < 24 HOURS Performed at [facility] [address] [place], [place] [address]    Report Status PENDING   Troponin I     Status: Abnormal   Collection Time: 04/10/17 12:10 PM  Result Value Ref Range   Troponin I 0.03 (HH) <0.03 ng/mL    Comment: CRITICAL VALUE NOTED.  VALUE IS CONSISTENT WITH PREVIOUSLY REPORTED AND CALLED VALUE.  I-Stat CG4 Lactic Acid, ED     Status: None   Collection Time: 04/10/17 12:20 PM  Result Value Ref Range   Lactic Acid, Venous 1.79 0.5 - 1.9 mmol/L  Culture, blood (Routine X 2) w Reflex to ID Panel      Status: None (Preliminary result)   Collection Time: 04/10/17 12:40 PM  Result Value Ref Range   Specimen Description BLOOD RIGHT ANTECUBITAL    Special Requests      BOTTLES DRAWN AEROBIC AND ANAEROBIC Blood Culture adequate volume   Culture      NO GROWTH < 24 HOURS Performed at [facility] [address], [place], [place] [address]    Report Status PENDING   Ethanol     Status: None   Collection Time: 04/10/17  3:18 PM  Result Value  Ref Range   Alcohol, Ethyl (B) <10 <10 mg/dL    Comment:        LOWEST DETECTABLE LIMIT FOR SERUM ALCOHOL IS 10 mg/dL FOR MEDICAL PURPOSES ONLY   Magnesium     Status: None   Collection Time: 04/10/17  3:18 PM  Result Value Ref Range   Magnesium 1.9 1.7 - 2.4 mg/dL  Phosphorus     Status: None   Collection Time: 04/10/17  3:18 PM  Result Value Ref Range   Phosphorus 3.7 2.5 - 4.6 mg/dL  Troponin I (q 6hr x 3)     Status: None   Collection Time: 04/10/17  6:27 PM  Result Value Ref Range   Troponin I <0.03 <0.03 ng/mL  Troponin I (q 6hr x 3)     Status: Abnormal   Collection Time: 04/10/17 11:59 PM  Result Value Ref Range   Troponin I 0.03 (HH) <0.03 ng/mL    Comment: CRITICAL VALUE NOTED.  VALUE IS CONSISTENT WITH PREVIOUSLY REPORTED AND CALLED VALUE.  Basic metabolic panel     Status: Abnormal   Collection Time: 04/10/17 11:59 PM  Result Value Ref Range   Sodium 145 135 - 145 mmol/L   Potassium 3.5 3.5 - 5.1 mmol/L   Chloride 111 101 - 111 mmol/L   CO2 23 22 - 32 mmol/L   Glucose, Bld 129 (H) 65 - 99 mg/dL   BUN 38 (H) 6 - 20 mg/dL   Creatinine, Ser 1.67 (H) 0.61 - 1.24 mg/dL   Calcium 8.5 (L) 8.9 - 10.3 mg/dL   GFR calc non Af Amer 36 (L) >60 mL/min   GFR calc Af Amer 42 (L) >60 mL/min    Comment: (NOTE) The eGFR has been calculated using the CKD EPI equation. This calculation has not been validated in all clinical situations. eGFR's persistently <60 mL/min signify possible Chronic Kidney Disease.    Anion gap 11 5 - 15   CBC     Status: Abnormal   Collection Time: 04/10/17 11:59 PM  Result Value Ref Range   WBC 9.4 4.0 - 10.5 K/uL   RBC 3.59 (L) 4.22 - 5.81 MIL/uL   Hemoglobin 9.8 (L) 13.0 - 17.0 g/dL   HCT 30.8 (L) 39.0 - 52.0 %   MCV 85.8 78.0 - 100.0 fL   MCH 27.3 26.0 - 34.0 pg   MCHC 31.8 30.0 - 36.0 g/dL   RDW 18.2 (H) 11.5 - 15.5 %   Platelets 195 150 - 400 K/uL  Troponin I (q 6hr x 3)     Status: Abnormal   Collection Time: 04/11/17  6:20 AM  Result Value Ref Range   Troponin I 0.04 (HH) <0.03 ng/mL    Comment: CRITICAL VALUE NOTED.  VALUE IS CONSISTENT WITH PREVIOUSLY REPORTED AND CALLED VALUE.  CBC     Status: Abnormal   Collection Time: 04/12/17  5:00 AM  Result Value Ref Range   WBC 8.6 4.0 - 10.5 K/uL   RBC 3.89 (L) 4.22 - 5.81 MIL/uL   Hemoglobin 10.5 (L) 13.0 - 17.0 g/dL   HCT 33.9 (L) 39.0 - 52.0 %   MCV 87.1 78.0 - 100.0 fL   MCH 27.0 26.0 - 34.0 pg   MCHC 31.0 30.0 - 36.0 g/dL   RDW 18.8 (H) 11.5 - 15.5 %   Platelets 209 150 - 400 K/uL  Basic metabolic panel     Status: Abnormal   Collection Time: 04/12/17  5:00 AM  Result Value Ref Range  Sodium 148 (H) 135 - 145 mmol/L   Potassium 3.8 3.5 - 5.1 mmol/L   Chloride 111 101 - 111 mmol/L   CO2 23 22 - 32 mmol/L   Glucose, Bld 116 (H) 65 - 99 mg/dL   BUN 44 (H) 6 - 20 mg/dL   Creatinine, Ser 1.64 (H) 0.61 - 1.24 mg/dL   Calcium 8.7 (L) 8.9 - 10.3 mg/dL   GFR calc non Af Amer 37 (L) >60 mL/min   GFR calc Af Amer 43 (L) >60 mL/min    Comment: (NOTE) The eGFR has been calculated using the CKD EPI equation. This calculation has not been validated in all clinical situations. eGFR's persistently <60 mL/min signify possible Chronic Kidney Disease.    Anion gap 14 5 - 15    Current Facility-Administered Medications  Medication Dose Route Frequency Provider Last Rate Last Dose  . 0.9 %  sodium chloride infusion   Intravenous Continuous Robbie Lis, MD 100 mL/hr at 04/12/17 1035    . acetaminophen (TYLENOL) tablet 650  mg  650 mg Oral Q6H PRN Elwin Mocha, MD       Or  . acetaminophen (TYLENOL) suppository 650 mg  650 mg Rectal Q6H PRN Elwin Mocha, MD      . aspirin chewable tablet 324 mg  324 mg Oral Daily Elwin Mocha, MD   324 mg at 04/12/17 1035  . dabigatran (PRADAXA) capsule 150 mg  150 mg Oral BID Molpus, John, MD   150 mg at 04/12/17 1036  . doxycycline (VIBRAMYCIN) 100 mg in dextrose 5 % 250 mL IVPB  100 mg Intravenous Q12H Robbie Lis, MD 125 mL/hr at 04/12/17 1035 100 mg at 04/12/17 1035  . DULoxetine (CYMBALTA) DR capsule 60 mg  60 mg Oral Daily Molpus, John, MD   60 mg at 04/12/17 1035  . feeding supplement (ENSURE ENLIVE) (ENSURE ENLIVE) liquid 237 mL  237 mL Oral BID BM Molpus, John, MD   237 mL at 04/12/17 1034  . finasteride (PROSCAR) tablet 5 mg  5 mg Oral Daily Molpus, John, MD   5 mg at 04/12/17 1036  . HYDROcodone-acetaminophen (NORCO) 10-325 MG per tablet 1 tablet  1 tablet Oral Q6H PRN Absher, Julieta Bellini, RPH   1 tablet at 04/11/17 0810  . Influenza vac split quadrivalent PF (FLUZONE HIGH-DOSE) injection 0.5 mL  0.5 mL Intramuscular Tomorrow-1000 Elwin Mocha, MD      . isosorbide mononitrate (IMDUR) 24 hr tablet 30 mg  30 mg Oral Daily Molpus, John, MD   30 mg at 04/12/17 1036  . metoprolol tartrate (LOPRESSOR) tablet 25 mg  25 mg Oral BID Molpus, John, MD   25 mg at 04/12/17 1036  . multivitamin with minerals tablet 1 tablet  1 tablet Oral Daily Molpus, John, MD   1 tablet at 04/12/17 1036  . nitroGLYCERIN (NITROSTAT) SL tablet 0.4 mg  0.4 mg Sublingual Q5 min PRN Elwin Mocha, MD      . ondansetron [facility]) tablet 4 mg  4 mg Oral Q6H PRN Elwin Mocha, MD       Or  . ondansetron [facility]) injection 4 mg  4 mg Intravenous Q6H PRN Elwin Mocha, MD      . pantoprazole (PROTONIX) EC tablet 40 mg  40 mg Oral Daily Molpus, John, MD   40 mg at 04/12/17 1036  . rosuvastatin (CRESTOR) tablet 10 mg  10 mg Oral QHS Molpus, John, MD   10 mg  at 04/11/17 2131     Musculoskeletal: Strength & Muscle Tone: decreased Gait & Station: unable to stand Patient leans: N/A  Psychiatric Specialty Exam: Physical Exam as per history and physical  ROS depressed, isolated, withdrawn, hopeless, helpless and feels worthless.  Patient refusing to take medication and oral intake with intention to end his life. No Fever-chills, No Headache, No changes with Vision or hearing, reports vertigo No problems swallowing food or Liquids, No Chest pain, Cough or Shortness of Breath, No Abdominal pain, No Nausea or Vommitting, Bowel movements are regular, No Blood in stool or Urine, No dysuria, No new skin rashes or bruises, No new joints pains-aches,  No new weakness, tingling, numbness in any extremity, No recent weight gain or loss, No polyuria, polydypsia or polyphagia,  A full 10 point Review of Systems was done, except as stated above, all other Review of Systems were negative.  Blood pressure 120/85, pulse 92, temperature (!) 97.4 F (36.3 C), temperature source Axillary, resp. rate 18, height 5' 7"  (1.702 m), weight 62.7 kg (138 lb 3.7 oz), SpO2 91 %.Body mass index is 21.65 kg/m.  General Appearance: Disheveled and Guarded  Eye Contact:  Fair  Speech:  Blocked, Slow and Slurred  Volume:  Decreased  Mood:  Anxious, Depressed, Hopeless and Worthless  Affect:  Depressed and Flat  Thought Process:  Coherent and Goal Directed  Orientation:  Full (Time, Place, and Person)  Thought Content:  Rumination  Suicidal Thoughts:  Yes.  with intent/plan  Homicidal Thoughts:  No  Memory:  Immediate;   Fair Recent;   Poor Remote;   Poor  Judgement:  Impaired  Insight:  Fair  Psychomotor Activity:  Psychomotor Retardation  Concentration:  Concentration: Fair and Attention Span: Fair  Recall:  Poor  Fund of Knowledge:  Fair  Language:  Good  Akathisia:  Negative  Handed:  Right  AIMS (if indicated):     Assets:  Armed forces logistics/support/administrative officer Housing Leisure  Time Social Support Transportation  ADL's:  Impaired  Cognition:  Impaired,  Mild  Sleep:        Treatment Plan Summary: 83 years old male presented with depression, dehydration and acute renal failure secondary to refusing to take oral food and liquid.  Patient also stopped taking his antidepressant medication.  Patient reportedly depressed for over 2 years since his last his wife.    Major depressive disorder, Recurrent, severe without psychotic symptoms  Recommendation: We will continue his Cymbalta 60 mg daily for depression and will start Remeron sublingual 15 mg daily at bedtime for insomnia and poor appetite  Patient meet criteria for inpatient psychiatric hospitalization Patient will be referred to the unit CSW for appropriate geriatric psychiatric placement needs when medically stable. Daily contact with patient to assess and evaluate symptoms and progress in treatment and Medication management Appreciate psychiatric consultation and follow up as clinically required Please contact [phone] or [phone] if needs further assistance  Disposition: Recommend psychiatric Inpatient admission when medically cleared. Supportive therapy provided about ongoing stressors.  Ambrose Finland, MD 04/12/2017 11:29 AM

## 2017-04-12 NOTE — Clinical Social Work Note (Signed)
Clinical Social Work Assessment  Patient Details  Name: Jacob [name] MRN: [id number] Date of Birth: [birth year]  Date of referral:  04/12/17               Reason for consult:  Facility Placement, Discharge Planning                Permission sought to share information with:  Chartered certified accountant granted to share information::  Yes, Verbal Permission Granted  Name::        Agency::     Relationship::     Contact Information:     Housing/Transportation Living arrangements for the past 2 months:  Single Family Home Source of Information:  Adult Children Patient Interpreter Needed:  None Criminal Activity/Legal Involvement Pertinent to Current Situation/Hospitalization:  No - Comment as needed Significant Relationships:  Adult Children Lives with:  Adult Children Do you feel safe going back to the place where you live?   (PT recommending SNF) Need for family participation in patient care:  Yes (Comment)  Care giving concerns:  Patient from home and lives with 2 adult children. Patient brought to the ED for AMS. Patient's children reported that they assist patient with ADLs in the home.  PT recommending SNF, psych consult pending.   Social Worker assessment / plan:  CSW spoke with patient and patient's children bedside regarding PT recommendation for SNF, patient alert and oriented x person. Patient's children reported that they are interested in SNF for ST rehab, noting that patient has been to rehab in the past. Patient's children reported that patient does not walk around much at home and they are hopeful that rehab will get patient around and moving. CSW explained SNF placement process and inquired about SNF preference, patient's children reported that they are interested in a facility close to their home due to lack of transportation. CSW informed patient's children that psych consult is pending and that psych's recommendation may impact patient's ability to be  placed at SNF, patient's children verbalized understanding.   CSW will complete FL2 and provide bed offers. Psych consult pending, CSW will follow up with patient's children after psych makes a recommendation.   Employment status:  Retired Forensic scientist:  Medicare PT Recommendations:  [place] / Referral to community resources:  [place]  Patient/Family's Response to care:  Patient's children agreeable to SNF for ST rehab. Patient's children appreciative of CSW assistance with discharge planning.   Patient/Family's Understanding of and Emotional Response to Diagnosis, Current Treatment, and Prognosis:  Patient presented calm and did not engage in assessment. Patient's children verbalized understanding of patient's diagnosis and current treatment. Patient's children presented hopeful, noting that rehab will be an opportunity for patient to start moving around more. Patient's children reported that the goal is for patient to return home after rehab.   Emotional Assessment Appearance:  Appears stated age Attitude/Demeanor/Rapport:  Unable to Assess Affect (typically observed):  Calm Orientation:  Oriented to Self Alcohol / Substance use:  Not Applicable Psych involvement (Current and /or in the community):  Yes (Comment) (Awaiting psych consult; Patient brought to the ED for AMS)  Discharge Needs  Concerns to be addressed:  Discharge Planning Concerns, Care Coordination Readmission within the last 30 days:  No Current discharge risk:  Physical Impairment Barriers to Discharge:  Continued Medical Work up   [employer], LCSW 04/12/2017, 10:46 AM

## 2017-04-12 NOTE — Telephone Encounter (Signed)
LVM for pts daughter to call abck and discuss, called sons number and pts grandson answer, he was not on Pts DPR, he stated he would have [name] call back and discuss. Pt is currently in the hospital.

## 2017-04-12 NOTE — Progress Notes (Signed)
Patient ID: Jacob [name], male   DOB: [birth year], 83 y.o.   MRN: [id number]  PROGRESS NOTE    Jacob [name]  FYB:[id number] DOB: 12/22/1931 DOA: 04/10/2017  PCP: Binnie Rail, MD   Brief Narrative:  83 year old male with history of BPH, esophageal strictures, presented to ED with weakness and confusion. Patient was recently diagnosed and treated for pneumonia with doxycycline and started 04/04/2017. Because of altered mental status he was brought to ED for further evaluation. Chest x-ray showed possible pneumonia on the left side. CT head showed no acute intracranial findings. Patient was started on empiric doxycycline.   Assessment & Plan:   Principal Problem:   Acute metabolic encephalopathy - Unclear etiology but possibly related to pneumonia - No acute intracranial findings on CT head - Per physical therapy evaluation, skilled nursing facility placement recommended and family is in agreement for SNF placement  - Psych consulted   Active Problems:   Lobar pneumonia, unspecified organism - Continue empiric doxycycline - Blood cultures showed no growth    Mild troponin elevation - Likely demand ischemia due to acute infection, hypoxia and acute kidney injury - Patient with occasional PVCs, asymptomatic     CAD - Continue Dabigatran and aspirin    CKD stage 3 - Cr 1.8 at baseline in 09/2015 - Cr is within baseline range     Anemia of CKD - Hgb stable     Essential hypertension - Continue isosorbide mononitrate 30 mg daily - Continue metoprolol 25 mg twice daily    Dyslipidemia - Continue Crestor 10 mg at bedtime    Moderate protein calorie malnutrition - In the context of chronic illness - Diet as tolerated   DVT prophylaxis: SCD's Code Status: DNR/DNI  Family Communication: son and daughter at the bedside this am Disposition Plan: possible discharge to SNF in next 24-48 hours     Consultants:   PT  Psych  Procedures:   None   Antimicrobials:    Doxycycline 04/10/2017 -->    Subjective: More alert this am.  Objective: Vitals:   04/11/17 1507 04/11/17 2108 04/12/17 0458 04/12/17 0500  BP: 124/76 120/69 120/85   Pulse: 98 98 92   Resp: 16 18 18    Temp: (!) 97.5 F (36.4 C) (!) 97.4 F (36.3 C) (!) 97.4 F (36.3 C)   TempSrc: Axillary Axillary Axillary   SpO2: 98% 98% 91%   Weight:    62.7 kg (138 lb 3.7 oz)  Height:        Intake/Output Summary (Last 24 hours) at 04/12/17 0954 Last data filed at 04/12/17 0603  Gross per 24 hour  Intake              195 ml  Output              100 ml  Net               95 ml   Filed Weights   04/10/17 1617 04/11/17 0546 04/12/17 0500  Weight: 61.6 kg (135 lb 14.4 oz) 61 kg (134 lb 7.7 oz) 62.7 kg (138 lb 3.7 oz)    Physical Exam  Constitutional: Appears well-developed and well-nourished. No distress.  CVS: RRR, S1/S2 + Pulmonary: Diminished breath sounds, no wheezing Abdominal: Soft. BS +,  no distension, tenderness, rebound or guarding.  Musculoskeletal: Normal range of motion. No edema and no tenderness.  Lymphadenopathy: No lymphadenopathy noted, cervical, inguinal. Neuro: Alert. No focal deficits Skin: Skin is warm and dry. Psychiatric:  Normal mood and affect.   Data Reviewed: I have personally reviewed following labs and imaging studies  CBC:  Recent Labs Lab 04/10/17 0057 04/10/17 2359 04/12/17 0500  WBC 7.5 9.4 8.6  NEUTROABS 6.0  --   --   HGB 11.4* 9.8* 10.5*  HCT 35.6* 30.8* 33.9*  MCV 85.6 85.8 87.1  PLT 216 195 403   Basic Metabolic Panel:  Recent Labs Lab 04/10/17 0057 04/10/17 1518 04/10/17 2359 04/12/17 0500  NA 142  --  145 148*  K 3.8  --  3.5 3.8  CL 106  --  111 111  CO2 24  --  23 23  GLUCOSE 136*  --  129* 116*  BUN 30*  --  38* 44*  CREATININE 1.64*  --  1.67* 1.64*  CALCIUM 9.1  --  8.5* 8.7*  MG  --  1.9  --   --   PHOS  --  3.7  --   --    GFR: Estimated Creatinine Clearance: 30.3 mL/min (A) (by C-G formula based on  SCr of 1.64 mg/dL (H)). Liver Function Tests:  Recent Labs Lab 04/10/17 0057  AST 36  ALT 17  ALKPHOS 397*  BILITOT 1.5*  PROT 7.4  ALBUMIN 3.1*   No results for input(s): LIPASE, AMYLASE in the last 168 hours. No results for input(s): AMMONIA in the last 168 hours. Coagulation Profile: No results for input(s): INR, PROTIME in the last 168 hours. Cardiac Enzymes:  Recent Labs Lab 04/10/17 0057 04/10/17 1210 04/10/17 1827 04/10/17 2359 04/11/17 0620  TROPONINI 0.03* 0.03* <0.03 0.03* 0.04*   BNP (last 3 results) No results for input(s): PROBNP in the last 8760 hours. HbA1C: No results for input(s): HGBA1C in the last 72 hours. CBG:  Recent Labs Lab 04/10/17 0050  GLUCAP 124*   Lipid Profile: No results for input(s): CHOL, HDL, LDLCALC, TRIG, CHOLHDL, LDLDIRECT in the last 72 hours. Thyroid Function Tests: No results for input(s): TSH, T4TOTAL, FREET4, T3FREE, THYROIDAB in the last 72 hours. Anemia Panel: No results for input(s): VITAMINB12, FOLATE, FERRITIN, TIBC, IRON, RETICCTPCT in the last 72 hours. Urine analysis:    Component Value Date/Time   COLORURINE YELLOW 04/10/2017 [place] 04/10/2017 0057   LABSPEC 1.011 04/10/2017 0057   PHURINE 5.0 04/10/2017 0057   GLUCOSEU NEGATIVE 04/10/2017 0057   GLUCOSEU NEGATIVE 05/19/2011 1315   HGBUR SMALL (A) 04/10/2017 0057   BILIRUBINUR NEGATIVE 04/10/2017 0057   BILIRUBINUR negative 01/03/2017 1531   BILIRUBINUR Neg 12/07/2016   KETONESUR NEGATIVE 04/10/2017 0057   PROTEINUR NEGATIVE 04/10/2017 0057   UROBILINOGEN negative (A) 01/03/2017 1531   UROBILINOGEN 0.2 12/02/2011 2131   NITRITE NEGATIVE 04/10/2017 0057   LEUKOCYTESUR NEGATIVE 04/10/2017 0057   Sepsis Labs: @LABRCNTIP (procalcitonin:4,lacticidven:4)   Recent Results (from the past 240 hour(s))  Culture, blood (Routine X 2) w Reflex to ID Panel     Status: None (Preliminary result)   Collection Time: 04/10/17 12:05 PM  Result  Value Ref Range Status   Specimen Description BLOOD LEFT ARM  Final   Special Requests   Final    BOTTLES DRAWN AEROBIC AND ANAEROBIC Blood Culture adequate volume   Culture   Final    NO GROWTH < 24 HOURS Performed at [facility], [place] [address] [place], [place] [address]    Report Status PENDING  Incomplete  Culture, blood (Routine X 2) w Reflex to ID Panel     Status: None (Preliminary result)  Collection Time: 04/10/17 12:40 PM  Result Value Ref Range Status   Specimen Description BLOOD RIGHT ANTECUBITAL  Final   Special Requests   Final    BOTTLES DRAWN AEROBIC AND ANAEROBIC Blood Culture adequate volume   Culture   Final    NO GROWTH < 24 HOURS Performed at [facility], [address] [place], [place] [address]    Report Status PENDING  Incomplete      Radiology Studies: Dg Chest 2 View Result Date: 04/10/2017 1. Development of small pleural effusions. 2. Similar appearance of biapical interstitial and alveolar opacity 3. Worsening atelectasis or infiltrate at the left greater than right lung base   Ct Head Wo Contrast Result Date: 04/10/2017 1. No acute intracranial process. 2. Stable moderate to severe parenchymal brain volume loss. 3. Moderate to severe chronic small vessel ischemic disease and old lacunar infarcts.    Scheduled Meds: . aspirin  324 mg Oral Daily  . dabigatran  150 mg Oral BID  . doxycycline  100 mg Oral BID  . DULoxetine  60 mg Oral Daily  . feeding supplement (ENSURE ENLIVE)  237 mL Oral BID BM  . finasteride  5 mg Oral Daily  . isosorbide mononitrate  30 mg Oral Daily  . metoprolol tartrate  25 mg Oral BID  . multivitamin with minerals  1 tablet Oral Daily  . pantoprazole  40 mg Oral Daily  . rosuvastatin  10 mg Oral QHS   Continuous Infusions: . sodium chloride    . doxycycline (VIBRAMYCIN) IV       LOS: 2 days    Time spent: 25 minutes  Greater than 50% of the time spent on counseling and coordinating the  care.   Leisa Lenz, MD [facility] Pager [phone]  If 7PM-7AM, please contact night-coverage [email or web address] Password [email or web address] 04/12/2017, 9:54 AM

## 2017-04-12 NOTE — Progress Notes (Signed)
Currently no available video monitoring sitters.   [employer] stated they would bring video monitoring machine when one becomes available.

## 2017-04-12 NOTE — Telephone Encounter (Signed)
Pts daughter and son came into office, discussed with them and they are going to consult with hospitalist.

## 2017-04-13 LAB — BASIC METABOLIC PANEL
ANION GAP: 11 (ref 5–15)
BUN: 49 mg/dL — AB (ref 6–20)
CHLORIDE: 112 mmol/L — AB (ref 101–111)
CO2: 24 mmol/L (ref 22–32)
Calcium: 8.5 mg/dL — ABNORMAL LOW (ref 8.9–10.3)
Creatinine, Ser: 1.72 mg/dL — ABNORMAL HIGH (ref 0.61–1.24)
GFR calc Af Amer: 41 mL/min — ABNORMAL LOW (ref >60)
GFR, EST NON AFRICAN AMERICAN: 35 mL/min — AB (ref >60)
Glucose, Bld: 112 mg/dL — ABNORMAL HIGH (ref 65–99)
POTASSIUM: 3.6 mmol/L (ref 3.5–5.1)
SODIUM: 147 mmol/L — AB (ref 135–145)

## 2017-04-13 LAB — CBC
HCT: 34.1 % — ABNORMAL LOW (ref 39.0–52.0)
HEMOGLOBIN: 10.6 g/dL — AB (ref 13.0–17.0)
MCH: 27 pg (ref 26.0–34.0)
MCHC: 31.1 g/dL (ref 30.0–36.0)
MCV: 87 fL (ref 78.0–100.0)
Platelets: 199 10*3/uL (ref 150–400)
RBC: 3.92 MIL/uL — AB (ref 4.22–5.81)
RDW: 19 % — ABNORMAL HIGH (ref 11.5–15.5)
WBC: 9.8 10*3/uL (ref 4.0–10.5)

## 2017-04-13 MED ORDER — LEVOFLOXACIN IN D5W 750 MG/150ML IV SOLN
750.0000 mg | INTRAVENOUS | Status: DC
  Administered 2017-04-13: 750 mg via INTRAVENOUS
  Filled 2017-04-13: qty 150

## 2017-04-13 MED ORDER — SODIUM CHLORIDE 0.9 % IV SOLN
INTRAVENOUS | Status: DC
  Administered 2017-04-13: 11:00:00 via INTRAVENOUS

## 2017-04-13 NOTE — Clinical Social Work Placement (Signed)
   CLINICAL SOCIAL WORK PLACEMENT  NOTE  Date:  04/13/2017  Patient Details  Name: Jacob [name] MRN: [id number] Date of Birth: July 07, 1931  Clinical Social Work is seeking post-discharge placement for this patient at the [place] level of care (*CSW will initial, date and re-position this form in  chart as items are completed):  Yes   Patient/family provided with [facility] Department's list of facilities offering this level of care within the geographic area requested by the patient (or if unable, by the patient's family).  Yes   Patient/family informed of their freedom to choose among providers that offer the needed level of care, that participate in Medicare, Medicaid or managed care program needed by the patient, have an available bed and are willing to accept the patient.  Yes   Patient/family informed of [facility]'s ownership interest in [facility] and [facility], as well as of the fact that they are under no obligation to receive care at these facilities.  PASRR submitted to EDS on       PASRR number received on       Existing PASRR number confirmed on 04/12/17     FL2 transmitted to all facilities in geographic area requested by pt/family on 04/12/17     FL2 transmitted to all facilities within larger geographic area on       Patient informed that his/her managed care company has contracts with or will negotiate with certain facilities, including the following:        Yes   Patient/family informed of bed offers received.  Patient chooses bed at       Physician recommends and patient chooses bed at      Patient to be transferred to   on  .  Patient to be transferred to facility by       Patient family notified on   of transfer.  Name of family member notified:        PHYSICIAN       Additional Comment:    _______________________________________________ Burnis Medin, LCSW 04/13/2017, 4:19 PM

## 2017-04-13 NOTE — Progress Notes (Signed)
[facility] PROGRESS NOTE  Jacob [name] AYT:[id number] DOB: [birth year] DOA: 04/10/2017 PCP: Binnie Rail, MD  Brief summary   83 year old male with history of BPH, esophageal strictures, presented to ED with weakness and confusion. Patient was recently diagnosed and treated for pneumonia with doxycycline and started 04/04/2017. Because of altered mental status he was brought to ED for further evaluation. Chest x-ray showed possible pneumonia on the left side. CT head showed no acute intracranial findings. Patient was started on empiric doxycycline. Then hypothermia.   Assessment/Plan:    Acute metabolic encephalopathy. Probable underlying dementia.  Likely worse with pneumonia.  No acute intracranial findings on CT head. Per physical therapy evaluation, skilled nursing facility placement recommended and family is in agreement for SNF placement. appreciate psych consulted      Lobar pneumonia, unspecified organism. Patient was on empiric doxycycline as outpatient, then continued inpatient.  Blood cultures showed no growth. CXR: Similar appearance of biapical interstitial and alveolar opacity. Worsening atelectasis or infiltrate at the left greater than right lung base  -still mild hypothermic. Persistent Cough. Transition to iv levofloxacin today, monitor 24-48 hrs. Iv fluids for dehydration today      Mild troponin elevation. Likely demand ischemia due to acute infection, hypoxia and acute kidney injury. No acute chest pains      CAD. Continue Dabigatran and aspirin    CKD stage 3. Cr 1.8 at baseline in 09/2015.  Cr is within baseline range     Anemia of CKD. Hgb stable     Essential hypertension. Continue isosorbide mononitrate 30 mg daily. Continue metoprolol 25 mg twice daily    Dyslipidemia.  Continue Crestor 10 mg at bedtime    Moderate protein calorie malnutrition. In the context of chronic illness.  Diet as tolerated   DVT prophylaxis: SCD's Code Status:  DNR/DNI  Family Communication: family at the bedside  Disposition Plan: possible discharge to SNF. When clinically better. 24-48 hrs    Consultants:   PT  Psych  Procedures:   None   Antimicrobials:   Doxycycline 04/10/2017 --> 10-31  levofloxacin 10/31>>>  HPI/Subjective: Confused. No acute distress. Hypothermia. D/w his famiyl at the bedside   Objective: Vitals:   04/12/17 2100 04/13/17 0550  BP: 124/80 112/69  Pulse: 93 84  Resp: 18 18  Temp: (!) 97.5 F (36.4 C) (!) 97.5 F (36.4 C)  SpO2: 99% 100%    Intake/Output Summary (Last 24 hours) at 04/13/17 0928 Last data filed at 04/13/17 0200  Gross per 24 hour  Intake          1971.67 ml  Output              100 ml  Net          1871.67 ml   Filed Weights   04/11/17 0546 04/12/17 0500 04/13/17 0550  Weight: 61 kg (134 lb 7.7 oz) 62.7 kg (138 lb 3.7 oz) 63.4 kg (139 lb 12.4 oz)    Exam:   General:  No distress   Cardiovascular: s1,s2 rrr  Respiratory: few rales rll  Abdomen: soft, nt, nd n  Musculoskeletal: no leg edema    Data Reviewed: Basic Metabolic Panel:  Recent Labs Lab 04/10/17 0057 04/10/17 1518 04/10/17 2359 04/12/17 0500 04/13/17 0534  NA 142  --  145 148* 147*  K 3.8  --  3.5 3.8 3.6  CL 106  --  111 111 112*  CO2 24  --  23 23 24   GLUCOSE 136*  --  129* 116* 112*  BUN 30*  --  38* 44* 49*  CREATININE 1.64*  --  1.67* 1.64* 1.72*  CALCIUM 9.1  --  8.5* 8.7* 8.5*  MG  --  1.9  --   --   --   PHOS  --  3.7  --   --   --    Liver Function Tests:  Recent Labs Lab 04/10/17 0057  AST 36  ALT 17  ALKPHOS 397*  BILITOT 1.5*  PROT 7.4  ALBUMIN 3.1*   No results for input(s): LIPASE, AMYLASE in the last 168 hours. No results for input(s): AMMONIA in the last 168 hours. CBC:  Recent Labs Lab 04/10/17 0057 04/10/17 2359 04/12/17 0500 04/13/17 0534  WBC 7.5 9.4 8.6 9.8  NEUTROABS 6.0  --   --   --   HGB 11.4* 9.8* 10.5* 10.6*  HCT 35.6* 30.8* 33.9* 34.1*   MCV 85.6 85.8 87.1 87.0  PLT 216 195 209 199   Cardiac Enzymes:  Recent Labs Lab 04/10/17 0057 04/10/17 1210 04/10/17 1827 04/10/17 2359 04/11/17 0620  TROPONINI 0.03* 0.03* <0.03 0.03* 0.04*   BNP (last 3 results) No results for input(s): BNP in the last 8760 hours.  ProBNP (last 3 results) No results for input(s): PROBNP in the last 8760 hours.  CBG:  Recent Labs Lab 04/10/17 0050  GLUCAP 124*    Recent Results (from the past 240 hour(s))  Culture, blood (Routine X 2) w Reflex to ID Panel     Status: None (Preliminary result)   Collection Time: 04/10/17 12:05 PM  Result Value Ref Range Status   Specimen Description BLOOD LEFT ARM  Final   Special Requests   Final    BOTTLES DRAWN AEROBIC AND ANAEROBIC Blood Culture adequate volume   Culture   Final    NO GROWTH 2 DAYS Performed at [facility], [place] [address], [place], [place] [address]    Report Status PENDING  Incomplete  Culture, blood (Routine X 2) w Reflex to ID Panel     Status: None (Preliminary result)   Collection Time: 04/10/17 12:40 PM  Result Value Ref Range Status   Specimen Description BLOOD RIGHT ANTECUBITAL  Final   Special Requests   Final    BOTTLES DRAWN AEROBIC AND ANAEROBIC Blood Culture adequate volume   Culture   Final    NO GROWTH 2 DAYS Performed at [facility], [place] [address], [place], [place] [address]    Report Status PENDING  Incomplete     Studies: No results found.  Scheduled Meds: . aspirin  324 mg Oral Daily  . dabigatran  150 mg Oral BID  . DULoxetine  60 mg Oral Daily  . feeding supplement (ENSURE ENLIVE)  237 mL Oral BID BM  . finasteride  5 mg Oral Daily  . Influenza vac split quadrivalent PF  0.5 mL Intramuscular Tomorrow-1000  . isosorbide mononitrate  30 mg Oral Daily  . metoprolol tartrate  25 mg Oral BID  . mirtazapine  15 mg Oral QHS  . multivitamin with minerals  1 tablet Oral Daily  . pantoprazole  40 mg Oral Daily  . rosuvastatin  10 mg  Oral QHS   Continuous Infusions: . doxycycline (VIBRAMYCIN) IV Stopped (04/13/17 0035)    Principal Problem:   Acute metabolic encephalopathy Active Problems:   Dehydration   Adjustment disorder with disturbance of conduct   AKI (acute kidney injury) ([place])   CAP (community acquired pneumonia)  Time spent: >35 minutes    Kinnie Feil  [facility] Pager [phone]. If 7PM-7AM, please contact night-coverage at [email or web address], password [facility] 04/13/2017, 9:28 AM  LOS: 3 days

## 2017-04-13 NOTE — Progress Notes (Signed)
Physical Therapy Treatment Patient Details Name: Jacob [name] MRN: [id number] DOB: 11-08-1931 Today's Date: 04/13/2017    History of Present Illness Jacob [name] is a 83 y.o. male past medical history significant for BPH, CHF, CAD, TIA's HTN,esophageal stricture presented to the emergency room for weakness and confusion, found outside trying to climb into a window.. CT head negative. Has been treated for PNA recently    PT Comments    Pt in bed awake however difficult to sustain awareness.  Pt required 100% VC's for direction.  Assisted to EOB when family arrived.  Grandsons assisted by calling pt "Jacob [name]" noted increased alertness.  Required + 2 assist to amb in hallway with need for direction of walker and upright posture.  Very unsteady gait with B flex hips/knees, forward flex and delayed corrective response.  HIGH FALL RISK.    Follow Up Recommendations  SNF     Equipment Recommendations  None recommended by PT    Recommendations for Other Services       Precautions / Restrictions Precautions Precautions: Fall Precaution Comments: impaired cognition    Mobility  Bed Mobility Overal bed mobility: Needs Assistance Bed Mobility: Supine to Sit     Supine to sit: Mod assist;Max assist     General bed mobility comments:  assist legs over bed edge and assist trunk to sit upright    poor balance with posterior lean and delayed corrective responce.    Transfers Overall transfer level: Needs assistance Equipment used: Rolling walker (2 wheeled) Transfers: Sit to/from [facility] Sit to Stand: Mod assist;Max assist Stand pivot transfers: Mod assist;Max assist       General transfer comment: 100% VC's needed plus used family memmbers to increase self participation and alertness.  "Pa Pa" Jacob [name] present to help with direction.    Ambulation/Gait Ambulation/Gait assistance: Mod assist;+2 physical assistance;+2 safety/equipment Ambulation Distance  (Feet): 37 Feet Assistive device: Rolling walker (2 wheeled) Gait Pattern/deviations: Step-through pattern;Decreased stride length;Narrow base of support;Trunk flexed Gait velocity: decreased.     General Gait Details: 100% VC's to progress gait forward and 100% assist to direct walker.  Recliner following behind for safety.     Stairs            Wheelchair Mobility    Modified Rankin (Stroke Patients Only)       Balance                                            Cognition Arousal/Alertness: Awake/alert Behavior During Therapy: Flat affect Overall Cognitive Status: Impaired/Different from baseline Area of Impairment: Orientation;Following commands;Attention;Safety/judgement;Memory;Awareness;Problem solving                 Orientation Level: Person Current Attention Level: Alternating   Following Commands: Follows one step commands inconsistently       General Comments: difficult to initially sustain alertness.  Required one step repeated commands commands.        Exercises      General Comments        Pertinent Vitals/Pain Pain Assessment: No/denies pain    Home Living                      Prior Function            PT Goals (current goals can now be found in the care plan section)  Progress towards PT goals: Progressing toward goals    Frequency    Min 2X/week      PT Plan Current plan remains appropriate    Co-evaluation              AM-PAC PT "6 Clicks" Daily Activity  Outcome Measure  Difficulty turning over in bed (including adjusting bedclothes, sheets and blankets)?: Unable Difficulty moving from lying on back to sitting on the side of the bed? : Unable Difficulty sitting down on and standing up from a chair with arms (e.g., wheelchair, bedside commode, etc,.)?: Unable Help needed moving to and from a bed to chair (including a wheelchair)?: Total Help needed walking in hospital room?:  Total Help needed climbing 3-5 steps with a railing? : Total 6 Click Score: 6    End of Session Equipment Utilized During Treatment: Gait belt Activity Tolerance: Patient tolerated treatment well Patient left: in chair;with call bell/phone within reach;Other (comment) Chief Operating Officer) Nurse Communication: Mobility status PT Visit Diagnosis: Unsteadiness on feet (R26.81)     Time: 1140-1200 PT Time Calculation (min) (ACUTE ONLY): 20 min  Charges:  $Gait Training: 8-22 mins                    G Codes:       Rica Koyanagi  PTA [facility]  Acute  Rehab Pager      [phone]

## 2017-04-13 NOTE — Progress Notes (Signed)
Per chart review, psych recommending psychiatric inpatient admission when medically clear. CSW spoke with patient's family at bedside regarding psych recommendation and PT recommendation. Patient's family reported that they are open to doing whatever is best for patient but are more interested in SNF for rehab versus psychiatric inpatient admission. CSW will update patient's attending MD and see if psych can be contacted to see patient again. CSW will continue to follow and assist with discharge planning.   Abundio Miu, [place] Social Worker [facility] Cell#: [phone]

## 2017-04-14 DIAGNOSIS — D509 Iron deficiency anemia, unspecified: Secondary | ICD-10-CM

## 2017-04-14 DIAGNOSIS — E870 Hyperosmolality and hypernatremia: Secondary | ICD-10-CM

## 2017-04-14 LAB — CBC WITH DIFFERENTIAL/PLATELET
BASOS ABS: 0 10*3/uL (ref 0.0–0.1)
BASOS PCT: 0 %
EOS ABS: 0 10*3/uL (ref 0.0–0.7)
EOS PCT: 0 %
HCT: 34.8 % — ABNORMAL LOW (ref 39.0–52.0)
HEMOGLOBIN: 10.8 g/dL — AB (ref 13.0–17.0)
LYMPHS ABS: 1.2 10*3/uL (ref 0.7–4.0)
Lymphocytes Relative: 11 %
MCH: 27.3 pg (ref 26.0–34.0)
MCHC: 31 g/dL (ref 30.0–36.0)
MCV: 87.9 fL (ref 78.0–100.0)
Monocytes Absolute: 0.6 10*3/uL (ref 0.1–1.0)
Monocytes Relative: 6 %
NEUTROS PCT: 83 %
Neutro Abs: 8.9 10*3/uL — ABNORMAL HIGH (ref 1.7–7.7)
PLATELETS: 196 10*3/uL (ref 150–400)
RBC: 3.96 MIL/uL — AB (ref 4.22–5.81)
RDW: 19.2 % — ABNORMAL HIGH (ref 11.5–15.5)
WBC: 10.7 10*3/uL — AB (ref 4.0–10.5)

## 2017-04-14 LAB — BASIC METABOLIC PANEL
Anion gap: 10 (ref 5–15)
Anion gap: 7 (ref 5–15)
BUN: 49 mg/dL — AB (ref 6–20)
BUN: 48 mg/dL — AB (ref 6–20)
CALCIUM: 8.6 mg/dL — AB (ref 8.9–10.3)
CHLORIDE: 119 mmol/L — AB (ref 101–111)
CO2: 22 mmol/L (ref 22–32)
CO2: 23 mmol/L (ref 22–32)
CREATININE: 1.59 mg/dL — AB (ref 0.61–1.24)
Calcium: 8.9 mg/dL (ref 8.9–10.3)
Chloride: 118 mmol/L — ABNORMAL HIGH (ref 101–111)
Creatinine, Ser: 1.55 mg/dL — ABNORMAL HIGH (ref 0.61–1.24)
GFR calc Af Amer: 45 mL/min — ABNORMAL LOW (ref >60)
GFR, EST AFRICAN AMERICAN: 46 mL/min — AB (ref >60)
GFR, EST NON AFRICAN AMERICAN: 40 mL/min — AB (ref >60)
GFR, EST NON AFRICAN AMERICAN: 38 mL/min — AB (ref >60)
Glucose, Bld: 114 mg/dL — ABNORMAL HIGH (ref 65–99)
Glucose, Bld: 150 mg/dL — ABNORMAL HIGH (ref 65–99)
POTASSIUM: 3.8 mmol/L (ref 3.5–5.1)
Potassium: 3.4 mmol/L — ABNORMAL LOW (ref 3.5–5.1)
SODIUM: 151 mmol/L — AB (ref 135–145)
SODIUM: 148 mmol/L — AB (ref 135–145)

## 2017-04-14 LAB — RETICULOCYTES
RBC.: 3.96 MIL/uL — AB (ref 4.22–5.81)
RETIC COUNT ABSOLUTE: 122.8 10*3/uL (ref 19.0–186.0)
RETIC CT PCT: 3.1 % (ref 0.4–3.1)

## 2017-04-14 LAB — IRON AND TIBC
IRON: 24 ug/dL — AB (ref 45–182)
Saturation Ratios: 11 % — ABNORMAL LOW (ref 17.9–39.5)
TIBC: 225 ug/dL — ABNORMAL LOW (ref 250–450)
UIBC: 201 ug/dL

## 2017-04-14 LAB — VITAMIN B12: VITAMIN B 12: 1381 pg/mL — AB (ref 180–914)

## 2017-04-14 LAB — FERRITIN: FERRITIN: 124 ng/mL (ref 24–336)

## 2017-04-14 LAB — FOLATE: FOLATE: 7.9 ng/mL (ref >5.9)

## 2017-04-14 MED ORDER — ENSURE ENLIVE PO LIQD
237.0000 mL | Freq: Three times a day (TID) | ORAL | Status: DC
  Administered 2017-04-14 – 2017-04-18 (×9): 237 mL via ORAL

## 2017-04-14 MED ORDER — DEXTROSE 5 % IV SOLN
1.0000 g | INTRAVENOUS | Status: DC
  Administered 2017-04-15 – 2017-04-19 (×4): 1 g via INTRAVENOUS
  Filled 2017-04-14 (×5): qty 10

## 2017-04-14 MED ORDER — SODIUM CHLORIDE 0.45 % IV SOLN
INTRAVENOUS | Status: DC
  Administered 2017-04-14: 11:00:00 via INTRAVENOUS

## 2017-04-14 MED ORDER — SODIUM CHLORIDE 0.45 % IV SOLN: INTRAVENOUS | Status: DC

## 2017-04-14 MED ORDER — DEXTROSE 5 % IV SOLN: INTRAVENOUS | Status: DC

## 2017-04-14 MED ORDER — DEXTROSE 5 % IV SOLN
INTRAVENOUS | Status: DC
  Administered 2017-04-14: 12:00:00 via INTRAVENOUS

## 2017-04-14 MED ORDER — DEXTROSE 5 % IV SOLN
500.0000 mg | INTRAVENOUS | Status: DC
  Administered 2017-04-15 – 2017-04-19 (×5): 500 mg via INTRAVENOUS
  Filled 2017-04-14 (×5): qty 500

## 2017-04-14 NOTE — Progress Notes (Signed)
Spoke to social work and am in agreement that given difficulty with geropsych placement for patient, that it is a reasonable plan for patient to go to a SNF where his medical and psychiatric needs can be managed. According to Education officer, museum, family would also prefer patient to go to a SNF.   Buford Dresser, DO

## 2017-04-14 NOTE — Progress Notes (Signed)
Nutrition Follow-up  DOCUMENTATION CODES:   Non-severe (moderate) malnutrition in context of acute illness/injury  INTERVENTION:  - Continue Ensure Enlive, but will increase to TID. - Continue to encourage PO intakes of meals and supplements.  NUTRITION DIAGNOSIS:   Moderate Malnutrition related to acute illness (PNA) as evidenced by percent weight loss, moderate muscle depletion, severe fat depletion. -ongoing  GOAL:   Patient will meet greater than or equal to 90% of their needs -unmet on average.   MONITOR:   PO intake, Supplement acceptance, Weight trends, Labs  ASSESSMENT:   83 y.o. male past medical history significant for BPH, CHF, esophageal stricture presented to the emergency room for weakness and confusion.  Patient was recently diagnosed and being treated for pneumonia with doxycycline started on 10/22.    11/1  Pt is alert and oriented to self only. He consumed 10% of breakfast (50 kcal, 2 grams of protein) and 10% of lunch (65 kcal, 3 grams of protein) on 10/30 and 25% of breakfast (227 kcal, 8 grams of protein) on 10/31. Ensure Jeanne Ivan is currently ordered BID and pt has accepted 7 out of 8 doses since order placed on 10/29. Will increase to TID to aid in improved nutrition intake. Weight trending up and is now +9 lbs/4.2 kg since admission.  Medications reviewed; daily multivitamin with minerals, 40 mg oral Protonix/day.  Labs reviewed; Na: 147 mmol/L, Cl: 112 mmol/L, BUN: 49 mg/dL, creatinine: 1.72 mg/dL, Ca: 8.5 mg/dL, GFR: 41 mL/min.  IVF: 1/2 NS @ 100 mL/hr.    10/29 - No intakes documented since admission.  - Lunch tray in front of pt, he had only consumed 1 bite of meatloaf.  - Provided encouragement and provided assistance to pt with meal but he declined as he was not hungry.  - He denied abdominal pain or nausea.  - He was unable to provide any other pertinent information.  - No family/visitors present.  - Per chart review, he has lost 7 lbs (5% body  weight) in the past 1 month; significant for time frame.  - He has also lost 22 lbs (14% body weight) in the past 4.5 months; significant for time frame.      Diet Order:  Diet regular Room service appropriate? Yes; Fluid consistency: Thin  EDUCATION NEEDS:   No education needs have been identified at this time  Skin:  Skin Assessment: Reviewed RN Assessment  Last BM:  10/28  Height:   Ht Readings from Last 1 Encounters:  04/10/17 5\' 7"  (1.702 m)    Weight:   Wt Readings from Last 1 Encounters:  04/14/17 145 lb 12.8 oz (66.1 kg)    Ideal Body Weight:  67.27 kg  BMI:  Body mass index is 22.84 kg/m.  Estimated Nutritional Needs:   Kcal:  1400-1650 (23-27 kcal/kg)  Protein:  61-73 grams (1-1.2 grams/kg)  Fluid:  >/= 1.6 L/day     Jarome Matin, MS, RD, LDN, [facility] Inpatient Clinical Dietitian Pager # [phone] After hours/weekend pager # [phone]

## 2017-04-14 NOTE — Progress Notes (Signed)
PROGRESS NOTE    Jacob [name]  OFB:[id number] DOB: 01-14-1932 DOA: 04/10/2017 PCP: Binnie Rail, MD    Brief Narrative:  83 year old male with history of BPH, esophageal strictures, presented to ED with weakness and confusion. Patient was recently diagnosed and treated for pneumonia with doxycycline and started 04/04/2017. Because of altered mental status he was brought to ED for further evaluation. Chest x-ray showed possible pneumonia on the left side. CT head showed no acute intracranial findings. Patient was started on empiric doxycycline. Then hypothermia   Assessment & Plan:   Principal Problem:   Acute metabolic encephalopathy Active Problems:   Acute hypernatremia   Dehydration   Adjustment disorder with disturbance of conduct   AKI (acute kidney injury) ([place])   CAP (community acquired pneumonia)  #1 acute metabolic encephalopathy Likely multifactorial secondary to worsening pneumonia and hypernatremia in the setting of baseline dementia. The family patient with some clinical improvement. Head CT negative for any acute abnormalities. Patient with no focal neurological deficits. Blood cultures negative 2 to date. Urinalysis nitrite negative, leukocytes negative. Patient noted to be on IV Levaquin and a such will discontinue and place patient on IV Rocephin and azithromycin to cover for pneumonia. Change IV fluids to D5W. PT/OT. Supportive care. Follow.  #2 hypernatremia Likely secondary to volume depletion/dehydration secondary to poor oral intake and in the setting of diuretics. Diuretics have been discontinued. Change IV fluids to D5W. Follow.  #3 dehydration IV fluids.  #4 acute kidney injury Secondary to a prerenal azotemia in the setting of diuretics. Diuretics on hold. Renal function improving with hydration. Follow.  #5 community acquired pneumonia Patient presented with a worsening pneumonia. Blood cultures pending with no growth to date. Patient afebrile.  Change IV Levaquin to IV azithromycin IV Rocephin. Follow. Supportive care.  #6 iron deficiency anemia H&H stable. Follow.   DVT prophylaxis: pradaxa Code Status: DO NOT RESUSCITATE Family Communication: Updated son at bedside. Disposition Plan: Skilled talking facility when medically stable and improvement and resolution of hypernatremia.   Consultants:   Psychiatry: Dr. Louretta Shorten 04/12/2017  Procedures:   CT head 04/10/2017  Chest x-ray 04/10/2017  2-D echo 04/11/2017    Antimicrobials:   Oral doxycycline 04/10/2017>>>>> 04/12/2017  IV azithromycin 04/15/2017  IV Rocephin 04/15/2017  IV Levaquin 04/13/2017>>>>> 04/14/2017   Subjective: Patient sitting up in bed. Patient denies chest pain. No shortness of breath. Per son who was at bedside patient with improving mentation. Patient starting to eat a little bit of ice cream per son. Patient denies any suicidal or homicidal ideation.  Objective: Vitals:   04/13/17 0550 04/13/17 1505 04/13/17 2032 04/14/17 0453  BP: 112/69 119/79 114/62 140/74  Pulse: 84 84 86 94  Resp: 18 19 20 18   Temp: (!) 97.5 F (36.4 C) 98.1 F (36.7 C) 97.9 F (36.6 C) 97.8 F (36.6 C)  TempSrc: Rectal Oral Oral Oral  SpO2: 100% 100% 98% 98%  Weight: 63.4 kg (139 lb 12.4 oz)   66.1 kg (145 lb 12.8 oz)  Height:        Intake/Output Summary (Last 24 hours) at 04/14/17 1218 Last data filed at 04/14/17 0600  Gross per 24 hour  Intake          2191.67 ml  Output              250 ml  Net          1941.67 ml   Filed Weights   04/12/17 0500 04/13/17 0550 04/14/17 0453  Weight: 62.7 kg (138 lb 3.7 oz) 63.4 kg (139 lb 12.4 oz) 66.1 kg (145 lb 12.8 oz)    Examination:  General exam: Appears calm and comfortable. Dry mucous membranes.  Respiratory system: Clear to auscultation. Respiratory effort normal. Cardiovascular system: S1 & S2 heard, RRR. No JVD, murmurs, rubs, gallops or clicks. No pedal edema. Gastrointestinal system:  Abdomen is nondistended, soft and nontender. No organomegaly or masses felt. Normal bowel sounds heard. Central nervous system: Alert and oriented. No focal neurological deficits. Extremities: Symmetric 5 x 5 power. Skin: No rashes, lesions or ulcers Psychiatry: Judgement and insight appear normal. Mood & affect appropriate.     Data Reviewed: I have personally reviewed following labs and imaging studies  CBC:  Recent Labs Lab 04/10/17 0057 04/10/17 2359 04/12/17 0500 04/13/17 0534 04/14/17 0904  WBC 7.5 9.4 8.6 9.8 10.7*  NEUTROABS 6.0  --   --   --  8.9*  HGB 11.4* 9.8* 10.5* 10.6* 10.8*  HCT 35.6* 30.8* 33.9* 34.1* 34.8*  MCV 85.6 85.8 87.1 87.0 87.9  PLT 216 195 209 199 284   Basic Metabolic Panel:  Recent Labs Lab 04/10/17 0057 04/10/17 1518 04/10/17 2359 04/12/17 0500 04/13/17 0534 04/14/17 0904  NA 142  --  145 148* 147* 151*  K 3.8  --  3.5 3.8 3.6 3.8  CL 106  --  111 111 112* 119*  CO2 24  --  23 23 24 22   GLUCOSE 136*  --  129* 116* 112* 114*  BUN 30*  --  38* 44* 49* 49*  CREATININE 1.64*  --  1.67* 1.64* 1.72* 1.55*  CALCIUM 9.1  --  8.5* 8.7* 8.5* 8.9  MG  --  1.9  --   --   --   --   PHOS  --  3.7  --   --   --   --    GFR: Estimated Creatinine Clearance: 33.8 mL/min (A) (by C-G formula based on SCr of 1.55 mg/dL (H)). Liver Function Tests:  Recent Labs Lab 04/10/17 0057  AST 36  ALT 17  ALKPHOS 397*  BILITOT 1.5*  PROT 7.4  ALBUMIN 3.1*   No results for input(s): LIPASE, AMYLASE in the last 168 hours. No results for input(s): AMMONIA in the last 168 hours. Coagulation Profile: No results for input(s): INR, PROTIME in the last 168 hours. Cardiac Enzymes:  Recent Labs Lab 04/10/17 0057 04/10/17 1210 04/10/17 1827 04/10/17 2359 04/11/17 0620  TROPONINI 0.03* 0.03* <0.03 0.03* 0.04*   BNP (last 3 results) No results for input(s): PROBNP in the last 8760 hours. HbA1C: No results for input(s): HGBA1C in the last 72  hours. CBG:  Recent Labs Lab 04/10/17 0050  GLUCAP 124*   Lipid Profile: No results for input(s): CHOL, HDL, LDLCALC, TRIG, CHOLHDL, LDLDIRECT in the last 72 hours. Thyroid Function Tests: No results for input(s): TSH, T4TOTAL, FREET4, T3FREE, THYROIDAB in the last 72 hours. Anemia Panel:  Recent Labs  04/14/17 0904  RETICCTPCT 3.1   Sepsis Labs:  Recent Labs Lab 04/10/17 0111 04/10/17 0254 04/10/17 1220  LATICACIDVEN 2.90* 2.12* 1.79    Recent Results (from the past 240 hour(s))  Culture, blood (Routine X 2) w Reflex to ID Panel     Status: None (Preliminary result)   Collection Time: 04/10/17 12:05 PM  Result Value Ref Range Status   Specimen Description BLOOD LEFT ARM  Final   Special Requests   Final    BOTTLES DRAWN AEROBIC AND ANAEROBIC  Blood Culture adequate volume   Culture   Final    NO GROWTH 3 DAYS Performed at [facility], [place] [address], [place], [place] [address]    Report Status PENDING  Incomplete  Culture, blood (Routine X 2) w Reflex to ID Panel     Status: None (Preliminary result)   Collection Time: 04/10/17 12:40 PM  Result Value Ref Range Status   Specimen Description BLOOD RIGHT ANTECUBITAL  Final   Special Requests   Final    BOTTLES DRAWN AEROBIC AND ANAEROBIC Blood Culture adequate volume   Culture   Final    NO GROWTH 3 DAYS Performed at [facility], [place] [address] [place], [place] [address]    Report Status PENDING  Incomplete         Radiology Studies: No results found.      Scheduled Meds: . aspirin  324 mg Oral Daily  . dabigatran  150 mg Oral BID  . DULoxetine  60 mg Oral Daily  . feeding supplement (ENSURE ENLIVE)  237 mL Oral TID BM  . finasteride  5 mg Oral Daily  . Influenza vac split quadrivalent PF  0.5 mL Intramuscular Tomorrow-1000  . isosorbide mononitrate  30 mg Oral Daily  . metoprolol tartrate  25 mg Oral BID  . mirtazapine  15 mg Oral QHS  . multivitamin with minerals  1 tablet Oral  Daily  . pantoprazole  40 mg Oral Daily  . rosuvastatin  10 mg Oral QHS   Continuous Infusions: . [START ON 04/15/2017] azithromycin    . [START ON 04/15/2017] cefTRIAXone (ROCEPHIN)  IV    . dextrose       LOS: 4 days    Time spent: 74 mins    Melik Blancett, MD [facility] Pager [phone]  If 7PM-7AM, please contact night-coverage [email or web address] Password [email or web address] 04/14/2017, 12:18 PM

## 2017-04-14 NOTE — Progress Notes (Signed)
Physical Therapy Treatment Patient Details Name: Jacob [name] MRN: [id number] DOB: 31-May-1932 Today's Date: 04/14/2017    History of Present Illness Jacob [name] is a 83 y.o. male past medical history significant for BPH, CHF, CAD, TIA's HTN,esophageal stricture presented to the emergency room for weakness and confusion, found outside trying to climb into a window.. CT head negative. Has been treated for PNA recently    PT Comments    Required 100% VC's to progress gait forward with 100% needed to direct walker. Ambulated 50 feet with max assist +2 with recliner following. Required a short rest break at 25 ft. Followed one step commands inconsistently. Positioned in recliner with pillows for positioning.    Follow Up Recommendations  SNF     Equipment Recommendations  None recommended by PT    Recommendations for Other Services       Precautions / Restrictions Precautions Precautions: Fall Precaution Comments: Impaired cognition    Mobility  Bed Mobility                  Transfers Overall transfer level: Needs assistance Equipment used: Rolling walker (2 wheeled) Transfers: Sit to/from [employer] Transfers Sit to Stand: Max assist;Mod assist         General transfer comment: 100% VC's needed for increase self participation and alterness.   Ambulation/Gait Ambulation/Gait assistance: Mod assist;+2 physical assistance;+2 safety/equipment Ambulation Distance (Feet): 50 Feet Assistive device: Rolling walker (2 wheeled) Gait Pattern/deviations: Step-through pattern;Decreased stride length;Narrow base of support;Trunk flexed Gait velocity: decreased.     General Gait Details: 100% VC's to progress gait forward and 100% to direct walker. Recliner followed.   Stairs            Wheelchair Mobility    Modified Rankin (Stroke Patients Only)       Balance                                            Cognition  Arousal/Alertness: Awake/alert Behavior During Therapy: Flat affect Overall Cognitive Status: Impaired/Different from baseline Area of Impairment: Orientation;Following commands;Attention;Safety/judgement;Memory;Awareness;Problem solving                 Orientation Level: Person;Place Current Attention Level: Alternating   Following Commands: Follows one step commands inconsistently       General Comments: Required one step repeated.      Exercises      General Comments        Pertinent Vitals/Pain Pain Assessment: No/denies pain    Home Living                      Prior Function            PT Goals (current goals can now be found in the care plan section)      Frequency    Min 2X/week      PT Plan Current plan remains appropriate    Co-evaluation              AM-PAC PT "6 Clicks" Daily Activity  Outcome Measure  Difficulty turning over in bed (including adjusting bedclothes, sheets and blankets)?: Unable Difficulty moving from lying on back to sitting on the side of the bed? : Unable Difficulty sitting down on and standing up from a chair with arms (e.g., wheelchair, bedside commode, etc,.)?: Unable Help needed moving to and  from a bed to chair (including a wheelchair)?: Total Help needed walking in hospital room?: Total Help needed climbing 3-5 steps with a railing? : Total 6 Click Score: 6    End of Session Equipment Utilized During Treatment: Gait belt Activity Tolerance: Patient tolerated treatment well Patient left: in chair;with call bell/phone within reach;with nursing/sitter in room;with chair alarm set Nurse Communication: Mobility status PT Visit Diagnosis: Unsteadiness on feet (R26.81)     Time: 1572-6203 PT Time Calculation (min) (ACUTE ONLY): 23 min  Charges:  $Gait Training: 8-22 mins $Therapeutic Activity: 8-22 mins                    G Codes:       Almond Lint, SPTA [facility] [place]  PTA [facility]  Acute  Rehab Pager      [phone]

## 2017-04-15 ENCOUNTER — Inpatient Hospital Stay (HOSPITAL_COMMUNITY): Payer: Medicare Other

## 2017-04-15 DIAGNOSIS — R0600 Dyspnea, unspecified: Secondary | ICD-10-CM

## 2017-04-15 DIAGNOSIS — I5022 Chronic systolic (congestive) heart failure: Secondary | ICD-10-CM

## 2017-04-15 LAB — CBC WITH DIFFERENTIAL/PLATELET
BASOS PCT: 0 %
Basophils Absolute: 0 10*3/uL (ref 0.0–0.1)
EOS ABS: 0.1 10*3/uL (ref 0.0–0.7)
EOS PCT: 1 %
HCT: 35.9 % — ABNORMAL LOW (ref 39.0–52.0)
Hemoglobin: 10.9 g/dL — ABNORMAL LOW (ref 13.0–17.0)
Lymphocytes Relative: 11 %
Lymphs Abs: 1.1 10*3/uL (ref 0.7–4.0)
MCH: 26.7 pg (ref 26.0–34.0)
MCHC: 30.4 g/dL (ref 30.0–36.0)
MCV: 88 fL (ref 78.0–100.0)
MONO ABS: 0.6 10*3/uL (ref 0.1–1.0)
Monocytes Relative: 6 %
Neutro Abs: 8 10*3/uL — ABNORMAL HIGH (ref 1.7–7.7)
Neutrophils Relative %: 82 %
PLATELETS: 188 10*3/uL (ref 150–400)
RBC: 4.08 MIL/uL — AB (ref 4.22–5.81)
RDW: 19.4 % — AB (ref 11.5–15.5)
WBC: 9.8 10*3/uL (ref 4.0–10.5)

## 2017-04-15 LAB — BASIC METABOLIC PANEL
ANION GAP: 11 (ref 5–15)
BUN: 43 mg/dL — ABNORMAL HIGH (ref 6–20)
CALCIUM: 8.7 mg/dL — AB (ref 8.9–10.3)
CO2: 19 mmol/L — ABNORMAL LOW (ref 22–32)
Chloride: 114 mmol/L — ABNORMAL HIGH (ref 101–111)
Creatinine, Ser: 1.55 mg/dL — ABNORMAL HIGH (ref 0.61–1.24)
GFR, EST AFRICAN AMERICAN: 46 mL/min — AB (ref >60)
GFR, EST NON AFRICAN AMERICAN: 40 mL/min — AB (ref >60)
GLUCOSE: 152 mg/dL — AB (ref 65–99)
Potassium: 3.5 mmol/L (ref 3.5–5.1)
SODIUM: 144 mmol/L (ref 135–145)

## 2017-04-15 LAB — CULTURE, BLOOD (ROUTINE X 2)
CULTURE: NO GROWTH
CULTURE: NO GROWTH
SPECIAL REQUESTS: ADEQUATE
SPECIAL REQUESTS: ADEQUATE

## 2017-04-15 MED ORDER — FUROSEMIDE 10 MG/ML IJ SOLN
40.0000 mg | Freq: Once | INTRAMUSCULAR | Status: AC
  Administered 2017-04-15: 40 mg via INTRAVENOUS
  Filled 2017-04-15: qty 4

## 2017-04-15 MED ORDER — POTASSIUM CHLORIDE CRYS ER 20 MEQ PO TBCR
40.0000 meq | EXTENDED_RELEASE_TABLET | Freq: Once | ORAL | Status: AC
  Administered 2017-04-15: 40 meq via ORAL
  Filled 2017-04-15: qty 2

## 2017-04-15 NOTE — Progress Notes (Signed)
PROGRESS NOTE    Jacob [name]  KDT:[id number] DOB: [birth year] DOA: 04/10/2017 PCP: Binnie Rail, MD    Brief Narrative:  83 year old male with history of BPH, esophageal strictures, presented to ED with weakness and confusion. Patient was recently diagnosed and treated for pneumonia with doxycycline and started 04/04/2017. Because of altered mental status he was brought to ED for further evaluation. Chest x-ray showed possible pneumonia on the left side. CT head showed no acute intracranial findings. Patient was started on empiric doxycycline. Then hypothermia   Assessment & Plan:   Principal Problem:   Acute metabolic encephalopathy Active Problems:   Acute hypernatremia   GERD (gastroesophageal reflux disease)   Dehydration   Dyspnea   Adjustment disorder with disturbance of conduct   AKI (acute kidney injury) ([place])   CAP (community acquired pneumonia)  #1 acute metabolic encephalopathy Likely multifactorial secondary to worsening pneumonia and hypernatremia in the setting of baseline dementia. Per family patient with clinical improvement. Head CT negative for any acute abnormalities. Patient with no focal neurological deficits. Blood cultures negative 2 to date. Urinalysis nitrite negative, leukocytes negative. Patient noted to be on IV Levaquin which was subsequently changed to IV Rocephin and azithromycin to cover for pneumonia. Saline lock IV fluids. PT/OT. Supportive care.   #2 hypernatremia Likely secondary to volume depletion/dehydration secondary to poor oral intake and in the setting of diuretics. Diuretics were discontinued. Hyponatremia improved on D5W. Saline lock IV fluids secondary to shortness of breath and concern for volume overload. Follow closely.  #3 dehydration Patient with shortness of breath. Saline lock IV fluids.   #4 acute kidney injury Secondary to a prerenal azotemia in the setting of diuretics. Diuretics were held and patient hydrated with IV  fluids with improving kidney function. Patient now short of breath this morning and as such will saline lock IV fluids and give a dose of Lasix 40 mg IV 1. Follow.  #5 shortness of breath Patient with shortness of breath this morning per RN. Patient noted to have some bibasilar crackles visibly short of breath. Patient was being hydrated with IV fluids due to dehydration and hypernatremia. Saline lock IV fluids. Check a chest x-ray. Lasix 40 mg IV 1. Strict I's and O's. Daily weights.  #6 community acquired pneumonia Patient presented with a worsening pneumonia. Blood cultures pending with no growth to date. Patient afebrile. Changed IV Levaquin to IV azithromycin and  IV Rocephin. Follow. Supportive care.  #7 iron deficiency anemia H&H stable. Follow.  #8 history of chronic systolic heart failure/ischemic cardiomyopathy Patient noted with complaints of shortness of breath this morning likely secondary to aggressive hydration due to hyponatremia and dehydration. Patient's diuretics on hold. 2-D echo from 04/11/2017 with a EF of 20%, diffuse hypokinesis. Check a chest x-ray. Lasix 40 mg IV 1. Strict I's and O's. Daily weights. If clinical improvement and continued improvement with hyponatremia, may resume home regimen of oral Lasix tomorrow 04/16/2017.  #9 depression Patient has been seen in consultation by psychiatry. Patient restarted on home regimen of Cymbalta 60 mg daily. Patient placed on Remeron sublingual 15 mg daily at bedtime for insomnia and poor appetite. Outpatient follow-up.    DVT prophylaxis: pradaxa Code Status: DO NOT RESUSCITATE Family Communication: Updated son at bedside. Disposition Plan: Skilled talking facility when medically stable and improvement and resolution of hypernatremia and shortness of breath.   Consultants:   Psychiatry: Dr. Louretta Shorten 04/12/2017  Procedures:   CT head 04/10/2017  Chest x-ray 04/10/2017  2-D echo  04/11/2017    Antimicrobials:   Oral doxycycline 04/10/2017>>>>> 04/12/2017  IV azithromycin 04/15/2017  IV Rocephin 04/15/2017  IV Levaquin 04/13/2017>>>>> 04/14/2017   Subjective: Patient sitting up in bed. Patient appears short of breath. Patient denies any chest pain. Patient endorses some shortness of breath. Per nursing patient with shortness of breath this morning.   Objective: Vitals:   04/14/17 0453 04/14/17 1449 04/14/17 2059 04/15/17 0600  BP: 140/74 125/81 (!) 142/83 (!) 115/91  Pulse: 94 81 (!) 101 83  Resp: 18 17 18 16   Temp: 97.8 F (36.6 C) 98.1 F (36.7 C) 97.8 F (36.6 C) 97.6 F (36.4 C)  TempSrc: Oral Oral Axillary Oral  SpO2: 98% 100% 100% 97%  Weight: 66.1 kg (145 lb 12.8 oz)   67.6 kg (149 lb 0.5 oz)  Height:        Intake/Output Summary (Last 24 hours) at 04/15/17 0920 Last data filed at 04/15/17 0600  Gross per 24 hour  Intake          1858.33 ml  Output              100 ml  Net          1758.33 ml   Filed Weights   04/13/17 0550 04/14/17 0453 04/15/17 0600  Weight: 63.4 kg (139 lb 12.4 oz) 66.1 kg (145 lb 12.8 oz) 67.6 kg (149 lb 0.5 oz)    Examination:  General exam: Appears SOB.  Respiratory system: Bibasilar crackles. No wheezing. Fair air movement.  Cardiovascular system: S1 & S2 heard, RRR. No JVD, murmurs, rubs, gallops or clicks. No pedal edema. Gastrointestinal system: Abdomen is nondistended, soft and nontender. No organomegaly or masses felt. Normal bowel sounds heard. Central nervous system: Alert and oriented. No focal neurological deficits. Extremities: Symmetric 5 x 5 power. Skin: No rashes, lesions or ulcers Psychiatry: Judgement and insight appear fair. Mood & affect flat.     Data Reviewed: I have personally reviewed following labs and imaging studies  CBC:  Recent Labs Lab 04/10/17 0057 04/10/17 2359 04/12/17 0500 04/13/17 0534 04/14/17 0904 04/15/17 0509  WBC 7.5 9.4 8.6 9.8 10.7* 9.8  NEUTROABS 6.0   --   --   --  8.9* 8.0*  HGB 11.4* 9.8* 10.5* 10.6* 10.8* 10.9*  HCT 35.6* 30.8* 33.9* 34.1* 34.8* 35.9*  MCV 85.6 85.8 87.1 87.0 87.9 88.0  PLT 216 195 209 199 196 237   Basic Metabolic Panel:  Recent Labs Lab 04/10/17 1518  04/12/17 0500 04/13/17 0534 04/14/17 0904 04/14/17 1651 04/15/17 0509  NA  --   < > 148* 147* 151* 148* 144  K  --   < > 3.8 3.6 3.8 3.4* 3.5  CL  --   < > 111 112* 119* 118* 114*  CO2  --   < > 23 24 22 23  19*  GLUCOSE  --   < > 116* 112* 114* 150* 152*  BUN  --   < > 44* 49* 49* 48* 43*  CREATININE  --   < > 1.64* 1.72* 1.55* 1.59* 1.55*  CALCIUM  --   < > 8.7* 8.5* 8.9 8.6* 8.7*  MG 1.9  --   --   --   --   --   --   PHOS 3.7  --   --   --   --   --   --   < > = values in this interval not displayed. GFR: Estimated Creatinine Clearance: 33.8 mL/min (A) (  by C-G formula based on SCr of 1.55 mg/dL (H)). Liver Function Tests:  Recent Labs Lab 04/10/17 0057  AST 36  ALT 17  ALKPHOS 397*  BILITOT 1.5*  PROT 7.4  ALBUMIN 3.1*   No results for input(s): LIPASE, AMYLASE in the last 168 hours. No results for input(s): AMMONIA in the last 168 hours. Coagulation Profile: No results for input(s): INR, PROTIME in the last 168 hours. Cardiac Enzymes:  Recent Labs Lab 04/10/17 0057 04/10/17 1210 04/10/17 1827 04/10/17 2359 04/11/17 0620  TROPONINI 0.03* 0.03* <0.03 0.03* 0.04*   BNP (last 3 results) No results for input(s): PROBNP in the last 8760 hours. HbA1C: No results for input(s): HGBA1C in the last 72 hours. CBG:  Recent Labs Lab 04/10/17 0050  GLUCAP 124*   Lipid Profile: No results for input(s): CHOL, HDL, LDLCALC, TRIG, CHOLHDL, LDLDIRECT in the last 72 hours. Thyroid Function Tests: No results for input(s): TSH, T4TOTAL, FREET4, T3FREE, THYROIDAB in the last 72 hours. Anemia Panel:  Recent Labs  04/14/17 0904  VITAMINB12 1,381*  FOLATE 7.9  FERRITIN 124  TIBC 225*  IRON 24*  RETICCTPCT 3.1   Sepsis Labs:  Recent  Labs Lab 04/10/17 0111 04/10/17 0254 04/10/17 1220  LATICACIDVEN 2.90* 2.12* 1.79    Recent Results (from the past 240 hour(s))  Culture, blood (Routine X 2) w Reflex to ID Panel     Status: None (Preliminary result)   Collection Time: 04/10/17 12:05 PM  Result Value Ref Range Status   Specimen Description BLOOD LEFT ARM  Final   Special Requests   Final    BOTTLES DRAWN AEROBIC AND ANAEROBIC Blood Culture adequate volume   Culture   Final    NO GROWTH 4 DAYS Performed at [facility], [place] [address] [place], [place] [address]    Report Status PENDING  Incomplete  Culture, blood (Routine X 2) w Reflex to ID Panel     Status: None (Preliminary result)   Collection Time: 04/10/17 12:40 PM  Result Value Ref Range Status   Specimen Description BLOOD RIGHT ANTECUBITAL  Final   Special Requests   Final    BOTTLES DRAWN AEROBIC AND ANAEROBIC Blood Culture adequate volume   Culture   Final    NO GROWTH 4 DAYS Performed at [facility], [place] [address], [place], [place] [address]    Report Status PENDING  Incomplete         Radiology Studies: No results found.      Scheduled Meds: . aspirin  324 mg Oral Daily  . dabigatran  150 mg Oral BID  . DULoxetine  60 mg Oral Daily  . feeding supplement (ENSURE ENLIVE)  237 mL Oral TID BM  . finasteride  5 mg Oral Daily  . Influenza vac split quadrivalent PF  0.5 mL Intramuscular Tomorrow-1000  . isosorbide mononitrate  30 mg Oral Daily  . metoprolol tartrate  25 mg Oral BID  . mirtazapine  15 mg Oral QHS  . multivitamin with minerals  1 tablet Oral Daily  . pantoprazole  40 mg Oral Daily  . rosuvastatin  10 mg Oral QHS   Continuous Infusions: . azithromycin 500 mg (04/15/17 0913)  . cefTRIAXone (ROCEPHIN)  IV Stopped (04/15/17 0833)     LOS: 5 days    Time spent: 35 mins    Emmanuel Ercole, MD [facility] Pager [phone]  If 7PM-7AM, please contact night-coverage [email or web address] Password  [email or web address] 04/15/2017, 9:20 AM

## 2017-04-16 DIAGNOSIS — F3289 Other specified depressive episodes: Secondary | ICD-10-CM

## 2017-04-16 DIAGNOSIS — R0602 Shortness of breath: Secondary | ICD-10-CM

## 2017-04-16 DIAGNOSIS — R627 Adult failure to thrive: Secondary | ICD-10-CM

## 2017-04-16 LAB — BASIC METABOLIC PANEL
ANION GAP: 10 (ref 5–15)
ANION GAP: 9 (ref 5–15)
BUN: 42 mg/dL — AB (ref 6–20)
BUN: 41 mg/dL — AB (ref 6–20)
CHLORIDE: 114 mmol/L — AB (ref 101–111)
CHLORIDE: 109 mmol/L (ref 101–111)
CO2: 25 mmol/L (ref 22–32)
CO2: 26 mmol/L (ref 22–32)
Calcium: 8.5 mg/dL — ABNORMAL LOW (ref 8.9–10.3)
Calcium: 8.3 mg/dL — ABNORMAL LOW (ref 8.9–10.3)
Creatinine, Ser: 1.61 mg/dL — ABNORMAL HIGH (ref 0.61–1.24)
Creatinine, Ser: 1.77 mg/dL — ABNORMAL HIGH (ref 0.61–1.24)
GFR, EST AFRICAN AMERICAN: 44 mL/min — AB (ref >60)
GFR, EST AFRICAN AMERICAN: 39 mL/min — AB (ref >60)
GFR, EST NON AFRICAN AMERICAN: 38 mL/min — AB (ref >60)
GFR, EST NON AFRICAN AMERICAN: 34 mL/min — AB (ref >60)
Glucose, Bld: 93 mg/dL (ref 65–99)
Glucose, Bld: 174 mg/dL — ABNORMAL HIGH (ref 65–99)
POTASSIUM: 3.5 mmol/L (ref 3.5–5.1)
POTASSIUM: 3.5 mmol/L (ref 3.5–5.1)
SODIUM: 149 mmol/L — AB (ref 135–145)
SODIUM: 144 mmol/L (ref 135–145)

## 2017-04-16 LAB — CBC WITH DIFFERENTIAL/PLATELET
BASOS ABS: 0 10*3/uL (ref 0.0–0.1)
BASOS PCT: 0 %
EOS ABS: 0.1 10*3/uL (ref 0.0–0.7)
EOS PCT: 1 %
HCT: 36.4 % — ABNORMAL LOW (ref 39.0–52.0)
HEMOGLOBIN: 11.2 g/dL — AB (ref 13.0–17.0)
LYMPHS ABS: 1 10*3/uL (ref 0.7–4.0)
Lymphocytes Relative: 9 %
MCH: 26.8 pg (ref 26.0–34.0)
MCHC: 30.8 g/dL (ref 30.0–36.0)
MCV: 87.1 fL (ref 78.0–100.0)
Monocytes Absolute: 0.7 10*3/uL (ref 0.1–1.0)
Monocytes Relative: 7 %
Neutro Abs: 8.7 10*3/uL — ABNORMAL HIGH (ref 1.7–7.7)
Neutrophils Relative %: 83 %
PLATELETS: 179 10*3/uL (ref 150–400)
RBC: 4.18 MIL/uL — AB (ref 4.22–5.81)
RDW: 19.4 % — ABNORMAL HIGH (ref 11.5–15.5)
WBC: 10.5 10*3/uL (ref 4.0–10.5)

## 2017-04-16 MED ORDER — DEXTROSE 5 % IV SOLN
INTRAVENOUS | Status: DC
  Administered 2017-04-16 – 2017-04-18 (×2): via INTRAVENOUS

## 2017-04-16 MED ORDER — POTASSIUM CHLORIDE CRYS ER 20 MEQ PO TBCR
40.0000 meq | EXTENDED_RELEASE_TABLET | Freq: Once | ORAL | Status: AC
  Administered 2017-04-16: 40 meq via ORAL
  Filled 2017-04-16: qty 2

## 2017-04-16 NOTE — Progress Notes (Signed)
Voicemail left for daughter Gerrianne Scale to be notified of patient's transfer to floor 3W. 3W phone number also left for daughter. Attempted to call son Daelyn Mozer but no answer or VM.

## 2017-04-16 NOTE — Consult Note (Signed)
Consultation Note Date: 04/16/2017   Patient Name: Jacob [name]  DOB: Jul 23, 1931  MRN: [id number]  Age / Sex: 83 y.o., male  PCP: Binnie Rail, MD Referring Physician: Eugenie Filler, MD  Reason for Consultation: Establishing goals of care  HPI/Patient Profile: 83 y.o. male  with past medical history of BPH, esophageal strictures admitted on 04/10/2017 with weakness and confusion.   The patient has been admitted to the hospitalist service since 04-10-17 with PNA, hypernatremia, AKI, shortness of breath, reportedly has history of baseline dementia, chronic systolic CHF, has EF 56%, has AICD placement.   Patient was seen by Psych, he is noted to have depression and dehydration, AKI due to refusing POs, due to having been depressed since his wife passed away, he was placed on Cymbalta and Remeron.   Initially, it was noted that the patient ought to go to inpatient psych, more recently a SNF option is being considered, a palliative consult has been requested for goals of care discussions.   Clinical Assessment and Goals of Care: The patient is resting in bed, he does not open his eyes, he does not follow commands, he does not engage, it is noted that his AICD is to be turned off.   Supportive care continues, labs are being followed, lasix vs IVF are being adjusted.   Call placed, unable to reach any family this afternoon for [place] discussions, see below.       NEXT OF KIN  has son Jacob [name] [phone]  Also has daughter Jacob [name]    Patient is not awake, not alert, not conversational, there is no family at the bedside, call placed, unable to reach family, will re attempt on 04-17-17 for [place] discussions.   Recommend SNF rehab with palliative on discharge, if this is what the family wants. Alternatively, it does seem prudent, to at least begin  hospice discussions with patient/family. Await family meeting, final recommendations to follow, thank you for the consult.   Code Status/Advance Care Planning:  DNR    Symptom Management:   Continue current mode of care.   Palliative Prophylaxis:   Delirium Protocol   Psycho-social/Spiritual:   Desire for further Chaplaincy support: unknown.   Additional Recommendations: Education on Hospice  Prognosis:   Guarded   Discharge Planning: To Be Determined      Primary Diagnoses: Present on Admission: . Adjustment disorder with disturbance of conduct . Dehydration . AKI (acute kidney injury) ([place]) . CAP (community acquired pneumonia) . Acute metabolic encephalopathy . Acute hypernatremia . Dyspnea . GERD (gastroesophageal reflux disease) . FTT (failure to thrive) in adult . Depression   I have reviewed the medical record, interviewed the patient and family, and examined the patient. The following aspects are pertinent.  Past Medical History:  Diagnosis Date  . AICD (automatic cardioverter/defibrillator) present   . Arthritis   . Atrial fibrillation ([place])   . Benign prostatic hypertrophy   . BPH (benign prostatic hyperplasia)   .  CHF (congestive heart failure) ([place])   . Chronic systolic heart failure ([facility])    a. EF 25-30% in past; b.Echo 11/08/11: Normal LV wall thickness, EF 45-50%. c. Echo 04/17: EF 15-20%, diffuse akinesis, moderate MR, mod dilated LA, trivial PE   . Coronary artery disease    a. Chronically occ RCA. cardiac cath 10/2010: Mid LAD 20-30%, distal LAD 40%, mid D1 40%, mid circumflex 20-30%, distal RCA occluded with left to right collaterals. c. 10/2015: cath- relatively stable dz since 2012.    . Depression   . Esophageal stricture   . GERD (gastroesophageal reflux disease)   . Hemorrhoids   . History of diverticulitis of colon   . History of kidney stones   . History of nephrolithiasis   . HLD (hyperlipidemia)   . HTN (hypertension)   .  Ischemic cardiomyopathy    a. s/p ICD 2008   . Myocardial infarction ([place])   . Pneumonia   . Renal insufficiency    Renal failure in July 2012 secondary to hypotension, ARB use, and   . Skin cancer    skin neck  . Stroke ([place])    tia's  . TIA (transient ischemic attack) 10/2011   carotids neg for ICA stenosis  . Urinary retention    foley indwelling 12/2010-04/2011   Social History   Social History  . Marital status: Married    Spouse name: N/A  . Number of children: N/A  . Years of education: N/A   Occupational History  . retired    Social History Main Topics  . Smoking status: Former Smoker    Packs/day: 2.00    Years: 30.00    Quit date: 06/14/1968  . Smokeless tobacco: Never Used  . Alcohol use No  . Drug use: No  . Sexual activity: Not Currently   Other Topics Concern  . None   Social History Narrative   Daily caffeine    Family History  Problem Relation Age of Onset  . Heart attack Mother 49       pt unsure of age  . Liver disease Father 58  . Heart disease Son        x 2  . Colon cancer Neg Hx    Scheduled Meds: . aspirin  324 mg Oral Daily  . dabigatran  150 mg Oral BID  . DULoxetine  60 mg Oral Daily  . feeding supplement (ENSURE ENLIVE)  237 mL Oral TID BM  . finasteride  5 mg Oral Daily  . Influenza vac split quadrivalent PF  0.5 mL Intramuscular Tomorrow-1000  . isosorbide mononitrate  30 mg Oral Daily  . metoprolol tartrate  25 mg Oral BID  . mirtazapine  15 mg Oral QHS  . multivitamin with minerals  1 tablet Oral Daily  . pantoprazole  40 mg Oral Daily  . rosuvastatin  10 mg Oral QHS   Continuous Infusions: . azithromycin Stopped (04/16/17 1304)  . cefTRIAXone (ROCEPHIN)  IV Stopped (04/16/17 1146)  . dextrose 75 mL/hr at 04/16/17 1116   PRN Meds:.acetaminophen **OR** acetaminophen, HYDROcodone-acetaminophen, nitroGLYCERIN, ondansetron **OR** ondansetron (ZOFRAN) IV Medications Prior to Admission:  Prior to Admission medications     Medication Sig Start Date End Date Taking? Authorizing Provider  dabigatran (PRADAXA) 150 MG CAPS capsule Take 1 capsule (150 mg total) by mouth 2 (two) times daily. 07/07/15  Yes Burns, Claudina Lick, MD  doxycycline (VIBRA-TABS) 100 MG tablet Take 1 tablet (100 mg total) by mouth 2 (two) times daily. 04/04/17  Yes Binnie Rail, MD  DULoxetine (CYMBALTA) 60 MG capsule Take 1 capsule (60 mg total) by mouth daily. 02/22/17  Yes Burns, Claudina Lick, MD  feeding supplement, ENSURE ENLIVE, (ENSURE ENLIVE) LIQD Take 237 mLs by mouth 2 (two) times daily between meals. 11/19/16  Yes Arrien, Jimmy Picket, MD  finasteride (PROSCAR) 5 MG tablet TAKE 1 TABLET(5 MG) BY MOUTH DAILY Patient taking differently: TAKE 1 TABLET(5 MG) BY MOUTH DAILY WITH LUNCH 04/01/16  Yes Burns, Claudina Lick, MD  fluticasone (FLONASE) 50 MCG/ACT nasal spray SHAKE LIQUID AND USE 2 SPRAYS IN EACH NOSTRIL DAILY AS NEEDED FOR ALLERGIES OR RHINITIS 02/07/17  Yes Burns, Claudina Lick, MD  furosemide (LASIX) 40 MG tablet TAKE 1 TABLET BY MOUTH DAILY Patient taking differently: TAKE 1 TABLET BY MOUTH DAILY IN THE MORNING 04/26/16  Yes Strader, [place] M, PA-C  HYDROcodone-acetaminophen (NORCO) 10-325 MG tablet Take 1 tablet by mouth 4 (four) times daily as needed for pain. 04/04/17  Yes [provider]  isosorbide mononitrate (IMDUR) 30 MG 24 hr tablet TAKE 1 TABLET(30 MG) BY MOUTH DAILY 12/16/16  Yes Burns, Claudina Lick, MD  metoprolol tartrate (LOPRESSOR) 25 MG tablet TAKE 1 TABLET BY MOUTH TWICE DAILY 08/17/16  Yes Strader, [place] M, PA-C  Multiple Vitamin (MULTIVITAMIN WITH MINERALS) TABS tablet Take 1 tablet by mouth daily. 11/19/16  Yes Arrien, Jimmy Picket, MD  nitroGLYCERIN (NITROSTAT) 0.4 MG SL tablet PLACE 1 TABLET UNDER THE TONGUE EVERY 5 MINUTES FOR 3 DOSES AS NEEDED FOR CHEST PAIN 02/28/17  Yes Burns, Claudina Lick, MD  omeprazole (PRILOSEC) 40 MG capsule TAKE 1 CAPSULE(40 MG) BY MOUTH DAILY 02/07/17  Yes Burns, Claudina Lick, MD  QUEtiapine (SEROQUEL) 25 MG  tablet Take 1 tablet (25 mg total) by mouth at bedtime. 11/19/16  Yes Arrien, Jimmy Picket, MD  ranitidine (ZANTAC) 300 MG tablet TAKE 1 TABLET(300 MG) BY MOUTH AT BEDTIME 04/01/16  Yes Burns, Claudina Lick, MD  rosuvastatin (CRESTOR) 10 MG tablet TAKE 1 TABLET BY MOUTH AT BEDTIME 04/26/16  Yes Strader, [place] M, PA-C  sucralfate (CARAFATE) 1 GM/10ML suspension Take 10 mLs (1 g total) by mouth 4 (four) times daily -  with meals and at bedtime. Patient taking differently: Take 1 g by mouth 4 (four) times daily as needed (for stomach issues.).  02/16/16  Yes Theodis Blaze, MD   Allergies  Allergen Reactions  . Bactrim [Sulfamethoxazole-Trimethoprim] Other (See Comments)    Throat swelling  . Penicillins Itching, Swelling and Rash    Has patient had a PCN reaction causing immediate rash, facial/tongue/throat swelling, SOB or lightheadedness with hypotension:unsure Has patient had a PCN reaction causing severe rash involving mucus membranes or skin necrosis:No Has patient had a PCN reaction that required hospitalization:No Has patient had a PCN reaction occurring within the last 10 years:No If all of the above answers are "NO", then may proceed with Cephalosporin use.    Review of Systems Non verbal  Physical Exam Appears weak, chronically ill Non verbal Regular work of breathing S1 S2 Abdomen soft Does not open eyes, does not communicate with me Does not follow commands  Vital Signs: BP (!) 108/58 (BP Location: Left Arm)   Pulse (!) 59   Temp (!) 97.5 F (36.4 C) (Oral)   Resp 18   Ht 5\' 7"  (1.702 m)   Wt 69.5 kg (153 lb 3.5 oz)   SpO2 95%   BMI 24.00 kg/m  Pain Assessment: No/denies pain POSS *See Group Information*: 1-Acceptable,Awake and alert Pain  Score: 0-No pain   SpO2: SpO2: 95 % O2 Device:SpO2: 95 % O2 Flow Rate: .O2 Flow Rate (L/min): 2 L/min  IO: Intake/output summary:  Intake/Output Summary (Last 24 hours) at 04/16/17 1534 Last data filed at 04/16/17 1114   Gross per 24 hour  Intake              120 ml  Output              700 ml  Net             -580 ml  PPS 20%  LBM: Last BM Date: 04/10/17 Baseline Weight: Weight: 61.7 kg (136 lb) Most recent weight: Weight: 69.5 kg (153 lb 3.5 oz)     Palliative Assessment/Data:   Flowsheet Rows     Most Recent Value  Intake Tab  Referral Department  Hospitalist  Unit at Time of Referral  Med/Surg Unit  Palliative Care Primary Diagnosis  Other (Comment) [dementia, PNA, FTT, poor PO intake, depression. ]  Palliative Care Type  New Palliative care  Reason for referral  Clarify Goals of Care  Date first seen by Palliative Care  04/16/17  Clinical Assessment  Palliative Performance Scale Score  20%  Pain Max last 24 hours  4  Pain Min Last 24 hours  3  Dyspnea Max Last 24 Hours  3  Dyspnea Min Last 24 hours  2  Nausea Max Last 24 Hours  0  Nausea Min Last 24 Hours  0  Anxiety Max Last 24 Hours  2  Anxiety Min Last 24 Hours  1  Psychosocial & Spiritual Assessment  Palliative Care Outcomes  Patient/Family meeting held?  No  Who was at the meeting?  patient is not awake/alert, not conversational, unable to reach family by phone, no family at bedside, will re attempt on 04-17-17       Time In:  1430 Time Out:  1530 Time Total:  60 min  Greater than 50%  of this time was spent counseling and coordinating care related to the above assessment and plan.  Signed by: Loistine Chance, MD  [phone]  Please contact Palliative Medicine Team phone at [phone] for questions and concerns.  For individual provider: See Shea Evans

## 2017-04-16 NOTE — Progress Notes (Signed)
Spoke with Melfa, [place] Representative to deactivate AICD.

## 2017-04-16 NOTE — Progress Notes (Signed)
PROGRESS NOTE    Jacob [name]  JJO:[id number] DOB: 1931-10-13 DOA: 04/10/2017 PCP: Binnie Rail, MD    Brief Narrative:  83 year old male with history of BPH, esophageal strictures, presented to ED with weakness and confusion. Patient was recently diagnosed and treated for pneumonia with doxycycline and started 04/04/2017. Because of altered mental status he was brought to ED for further evaluation. Chest x-ray showed possible pneumonia on the left side. CT head showed no acute intracranial findings. Patient was started on empiric doxycycline. Then hypothermia   Assessment & Plan:   Principal Problem:   Acute metabolic encephalopathy Active Problems:   Acute hypernatremia   GERD (gastroesophageal reflux disease)   Depression   Dehydration   Dyspnea   Adjustment disorder with disturbance of conduct   AKI (acute kidney injury) ([place])   CAP (community acquired pneumonia)   FTT (failure to thrive) in adult   SOB (shortness of breath)  #1 acute metabolic encephalopathy Likely multifactorial secondary to worsening pneumonia and hypernatremia, depression in the setting of baseline dementia and FTT. Patient now drowsy. Head CT negative for any acute abnormalities. Patient with no focal neurological deficits. Blood cultures negative 2 . Urinalysis nitrite negative, leukocytes negative. Patient noted to be on IV Levaquin which was subsequently changed to IV Rocephin and azithromycin to cover for pneumonia. Gentle hydration due to worsening hypernatremia. PT/OT. Supportive care.   #2 hypernatremia Likely secondary to volume depletion/dehydration secondary to poor oral intake and in the setting of diuretics. Diuretics were discontinued. Hypernatremia improved on D5W on 04/15/2017. Saline lock IV fluids secondary to shortness of breath and concern for volume overload on 04/15/2017 and patient given IV lasix. Hypernatremia worsened after diuretics yesterday. Gentle hydration with D5W. Follow  closely.  #3 dehydration Gentle hydration.   #4 acute kidney injury Secondary to a prerenal azotemia in the setting of diuretics. Diuretics were held and patient hydrated with IV fluids with improving kidney function. Patient was short of breath the morning of 04/15/2017 and a such fluids were saline locked and patient given a dose of IV Lasix. No significant change with renal function. Patient now on gentle hydration secondary to hyponatremia. Follow.   #5 shortness of breath Patient with shortness of breath the morning of 04/15/2017 per RN. Patient noted to have some bibasilar crackles then. Patient was being hydrated with IV fluids due to dehydration and hypernatremia. Saline lock IV fluids. Chest x-ray with no significant change since admission. Patient given a dose of IV Lasix. Worsening hypernatremia and as such will resume gentle hydration. Strict I's and O's. Daily weights.  #6 community acquired pneumonia Patient presented with a worsening pneumonia. Blood cultures negative.  Patient afebrile. Changed IV Levaquin to IV azithromycin and  IV Rocephin. Follow. Supportive care.  #7 iron deficiency anemia H&H stable. Follow.  #8 history of chronic systolic heart failure/ischemic cardiomyopathy Patient noted with complaints of shortness of breath the morning of 04/15/2017, likely secondary to aggressive hydration due to hypernatremia and dehydration. Patient's diuretics on hold. 2-D echo from 04/11/2017 with a EF of 20%, diffuse hypokinesis. Repeat chest x-ray done 04/15/2017 with persistent interstitial and hazy airspace opacities evident in the upper lobes associated mild cardiomegaly and small-to-moderate pleural effusions. Findings consistent with asymmetric pulmonary edema from CHF versus multifocal pneumonia. Patient given a dose of IV Lasix yesterday with a urine output of 1 L. Patient however with worsening hypernatremia now in as such will place back on gentle hydration with D5W. Follow.    #9 depression  Patient has been seen in consultation by psychiatry. Patient restarted on home regimen of Cymbalta 60 mg daily. Patient placed on Remeron sublingual 15 mg daily at bedtime for insomnia and poor appetite. Outpatient follow-up.  #10 failure to thrive Patient with failure to thrive symptoms per family. Patient has gone downhill since his wife died one to 2 years ago. Her family patient with significantly poor oral intake over the past several months and per family feel patient has given up his will to live. Patient also with a history of systolic heart failure/ischemic cardiomyopathy EF of 20% per 2-D occult 04/11/2017 with diffuse hypokinesis. Patient also with a defibrillator/AICD placement per family work will request in it to be turned off as patient is now a DO NOT RESUSCITATE. Patient admitted with significant hypernatremia, lethargy, depression as well as a pneumonia, metabolic encephalopathy and now with acute kidney injury. Per family patient with significant failure to thrive symptoms with poor oral intake. Patient with a poor prognosis. We'll consult with palliative care for goals of care.?? Hospice    DVT prophylaxis: pradaxa Code Status: DO NOT RESUSCITATE Family Communication: Updated son at bedside. Disposition Plan: Skilled talking facility when medically stable and improvement and resolution of hypernatremia and shortness of breath.   Consultants:   Psychiatry: Dr. Louretta Shorten 04/12/2017  Palliative care pending  Procedures:   CT head 04/10/2017  Chest x-ray 04/10/2017, 04/15/2017  2-D echo 04/11/2017    Antimicrobials:   Oral doxycycline 04/10/2017>>>>> 04/12/2017  IV azithromycin 04/15/2017  IV Rocephin 04/15/2017  IV Levaquin 04/13/2017>>>>> 04/14/2017   Subjective: Patient sleeping arousable however very drowsy. Per nursing patient has been more lethargic today. Family at bedside feel patient has been declining over the past few months  and patient may have lost the will to live after his wife died one to 2 years ago.   Objective: Vitals:   04/16/17 0456 04/16/17 0638 04/16/17 1319 04/16/17 1321  BP: (!) 120/55 (!) 127/55 (!) 108/58   Pulse: 80 67 (!) 59   Resp: 20 18 18    Temp: (!) 97.4 F (36.3 C) 97.7 F (36.5 C) (!) 97.5 F (36.4 C)   TempSrc: Oral Oral Oral   SpO2: 97% 97% (!) 83% 95%  Weight: 69.5 kg (153 lb 3.5 oz)     Height:        Intake/Output Summary (Last 24 hours) at 04/16/17 1617 Last data filed at 04/16/17 1114  Gross per 24 hour  Intake              120 ml  Output              700 ml  Net             -580 ml   Filed Weights   04/14/17 0453 04/15/17 0600 04/16/17 0456  Weight: 66.1 kg (145 lb 12.8 oz) 67.6 kg (149 lb 0.5 oz) 69.5 kg (153 lb 3.5 oz)    Examination:  General exam: Sleeping. Drowsy. Cachectic Respiratory system: Clear to auscultation anterior lung fields. No wheezing. Fair air movement.  Cardiovascular system: S1 & S2 heard, RRR. No JVD, murmurs, rubs, gallops or clicks. No pedal edema. Gastrointestinal system: Abdomen is nondistended, soft and nontender. No organomegaly or masses felt. Normal bowel sounds heard. Central nervous system: Alert and oriented. No focal neurological deficits. Extremities: Symmetric 5 x 5 power. Skin: No rashes, lesions or ulcers Psychiatry: Judgement and insight appear fair. Mood & affect flat.     Data Reviewed: I have  personally reviewed following labs and imaging studies  CBC:  Recent Labs Lab 04/10/17 0057  04/12/17 0500 04/13/17 0534 04/14/17 0904 04/15/17 0509 04/16/17 0558  WBC 7.5  < > 8.6 9.8 10.7* 9.8 10.5  NEUTROABS 6.0  --   --   --  8.9* 8.0* 8.7*  HGB 11.4*  < > 10.5* 10.6* 10.8* 10.9* 11.2*  HCT 35.6*  < > 33.9* 34.1* 34.8* 35.9* 36.4*  MCV 85.6  < > 87.1 87.0 87.9 88.0 87.1  PLT 216  < > 209 199 196 188 179  < > = values in this interval not displayed. Basic Metabolic Panel:  Recent Labs Lab 04/10/17 1518   04/13/17 0534 04/14/17 0904 04/14/17 1651 04/15/17 0509 04/16/17 0558  NA  --   < > 147* 151* 148* 144 149*  K  --   < > 3.6 3.8 3.4* 3.5 3.5  CL  --   < > 112* 119* 118* 114* 114*  CO2  --   < > 24 22 23  19* 25  GLUCOSE  --   < > 112* 114* 150* 152* 93  BUN  --   < > 49* 49* 48* 43* 42*  CREATININE  --   < > 1.72* 1.55* 1.59* 1.55* 1.61*  CALCIUM  --   < > 8.5* 8.9 8.6* 8.7* 8.5*  MG 1.9  --   --   --   --   --   --   PHOS 3.7  --   --   --   --   --   --   < > = values in this interval not displayed. GFR: Estimated Creatinine Clearance: 32.5 mL/min (A) (by C-G formula based on SCr of 1.61 mg/dL (H)). Liver Function Tests:  Recent Labs Lab 04/10/17 0057  AST 36  ALT 17  ALKPHOS 397*  BILITOT 1.5*  PROT 7.4  ALBUMIN 3.1*   No results for input(s): LIPASE, AMYLASE in the last 168 hours. No results for input(s): AMMONIA in the last 168 hours. Coagulation Profile: No results for input(s): INR, PROTIME in the last 168 hours. Cardiac Enzymes:  Recent Labs Lab 04/10/17 0057 04/10/17 1210 04/10/17 1827 04/10/17 2359 04/11/17 0620  TROPONINI 0.03* 0.03* <0.03 0.03* 0.04*   BNP (last 3 results) No results for input(s): PROBNP in the last 8760 hours. HbA1C: No results for input(s): HGBA1C in the last 72 hours. CBG:  Recent Labs Lab 04/10/17 0050  GLUCAP 124*   Lipid Profile: No results for input(s): CHOL, HDL, LDLCALC, TRIG, CHOLHDL, LDLDIRECT in the last 72 hours. Thyroid Function Tests: No results for input(s): TSH, T4TOTAL, FREET4, T3FREE, THYROIDAB in the last 72 hours. Anemia Panel:  Recent Labs  04/14/17 0904  VITAMINB12 1,381*  FOLATE 7.9  FERRITIN 124  TIBC 225*  IRON 24*  RETICCTPCT 3.1   Sepsis Labs:  Recent Labs Lab 04/10/17 0111 04/10/17 0254 04/10/17 1220  LATICACIDVEN 2.90* 2.12* 1.79    Recent Results (from the past 240 hour(s))  Culture, blood (Routine X 2) w Reflex to ID Panel     Status: None   Collection Time: 04/10/17  12:05 PM  Result Value Ref Range Status   Specimen Description BLOOD LEFT ARM  Final   Special Requests   Final    BOTTLES DRAWN AEROBIC AND ANAEROBIC Blood Culture adequate volume   Culture   Final    NO GROWTH 5 DAYS Performed at [facility], [place] [address] [place], [place] [address]  Report Status 04/15/2017 FINAL  Final  Culture, blood (Routine X 2) w Reflex to ID Panel     Status: None   Collection Time: 04/10/17 12:40 PM  Result Value Ref Range Status   Specimen Description BLOOD RIGHT ANTECUBITAL  Final   Special Requests   Final    BOTTLES DRAWN AEROBIC AND ANAEROBIC Blood Culture adequate volume   Culture   Final    NO GROWTH 5 DAYS Performed at [facility], [place] [address], [place], [place] [address]    Report Status 04/15/2017 FINAL  Final         Radiology Studies: Dg Chest 2 View  Result Date: 04/15/2017 CLINICAL DATA:  Altered mental status, lethargy, recent pneumonia EXAM: CHEST  2 VIEW COMPARISON:  04/10/2017 FINDINGS: Mild cardiomegaly, stable. Left anterior chest wall AICD is stable. No mediastinal or hilar masses. Small to moderate bilateral pleural effusions. Persistent interstitial and hazy airspace opacities in the upper lobes. There is central vascular congestion. Interstitial thickening is noted in the lower lungs. No pneumothorax. Skeletal structures are demineralized but grossly intact. IMPRESSION: 1. Findings are similar to the prior study. 2. Persistent interstitial and hazy airspace opacities most evident in the upper lobes associated with mild cardiomegaly and small to moderate pleural effusions. Findings consistent with asymmetric pulmonary edema from congestive heart failure versus multifocal pneumonia. Electronically Signed   By: Lajean Manes M.D.   On: 04/15/2017 13:48        Scheduled Meds: . aspirin  324 mg Oral Daily  . dabigatran  150 mg Oral BID  . DULoxetine  60 mg Oral Daily  . feeding supplement (ENSURE ENLIVE)  237  mL Oral TID BM  . finasteride  5 mg Oral Daily  . Influenza vac split quadrivalent PF  0.5 mL Intramuscular Tomorrow-1000  . isosorbide mononitrate  30 mg Oral Daily  . metoprolol tartrate  25 mg Oral BID  . mirtazapine  15 mg Oral QHS  . multivitamin with minerals  1 tablet Oral Daily  . pantoprazole  40 mg Oral Daily  . rosuvastatin  10 mg Oral QHS   Continuous Infusions: . azithromycin Stopped (04/16/17 1304)  . cefTRIAXone (ROCEPHIN)  IV Stopped (04/16/17 1146)  . dextrose 75 mL/hr at 04/16/17 1116     LOS: 6 days    Time spent: 73 mins    Tymira Horkey, MD [facility] Pager [phone]  If 7PM-7AM, please contact night-coverage [email or web address] Password [facility] 04/16/2017, 4:17 PM

## 2017-04-16 NOTE — Progress Notes (Signed)
Left message with "Jacob [name]" via [place] phone system in regards to turning off pt's defibrillator. Awaiting response.

## 2017-04-17 LAB — BASIC METABOLIC PANEL
Anion gap: 8 (ref 5–15)
BUN: 41 mg/dL — AB (ref 6–20)
CALCIUM: 8.2 mg/dL — AB (ref 8.9–10.3)
CO2: 26 mmol/L (ref 22–32)
CREATININE: 1.58 mg/dL — AB (ref 0.61–1.24)
Chloride: 110 mmol/L (ref 101–111)
GFR calc Af Amer: 45 mL/min — ABNORMAL LOW (ref >60)
GFR, EST NON AFRICAN AMERICAN: 39 mL/min — AB (ref >60)
GLUCOSE: 149 mg/dL — AB (ref 65–99)
POTASSIUM: 3.6 mmol/L (ref 3.5–5.1)
SODIUM: 144 mmol/L (ref 135–145)

## 2017-04-17 MED ORDER — LIP MEDEX EX OINT
TOPICAL_OINTMENT | CUTANEOUS | Status: AC
  Filled 2017-04-17: qty 7

## 2017-04-17 NOTE — Progress Notes (Signed)
Pt not taking in much PO, few sips. Incontinent for large amount this AM and small amount from condom cath tonight.

## 2017-04-17 NOTE — Progress Notes (Addendum)
Daily Progress Note   Patient Name: Jacob [name]       Date: 04/17/2017 DOB: 11-20-1931  Age: 83 y.o. MRN#: [id number] Attending Physician: Eugenie Filler, MD Primary Care Physician: Binnie Rail, MD Admit Date: 04/10/2017  Reason for Consultation/Follow-up: Establishing goals of care  Subjective:  patient is awake this am, eyes open, tracks me in the room He doesn't verbalize much, or meaningfully He does not appear to be in distress Not eating Discussed with Dr Grandville Silos, also discussed over the phone with son and daughter, see below:   Length of Stay: 7  Current Medications: Scheduled Meds:  . aspirin  324 mg Oral Daily  . dabigatran  150 mg Oral BID  . DULoxetine  60 mg Oral Daily  . feeding supplement (ENSURE ENLIVE)  237 mL Oral TID BM  . finasteride  5 mg Oral Daily  . Influenza vac split quadrivalent PF  0.5 mL Intramuscular Tomorrow-1000  . isosorbide mononitrate  30 mg Oral Daily  . metoprolol tartrate  25 mg Oral BID  . mirtazapine  15 mg Oral QHS  . multivitamin with minerals  1 tablet Oral Daily  . pantoprazole  40 mg Oral Daily  . rosuvastatin  10 mg Oral QHS    Continuous Infusions: . azithromycin Stopped (04/16/17 1304)  . cefTRIAXone (ROCEPHIN)  IV Stopped (04/16/17 1146)  . dextrose 75 mL/hr at 04/17/17 0432    PRN Meds: acetaminophen **OR** acetaminophen, HYDROcodone-acetaminophen, nitroGLYCERIN, ondansetron **OR** ondansetron (ZOFRAN) IV  Physical Exam         Awake this morning Regular S1 S2 Abdomen soft No edema Flat affect Mostly non verbal  Vital Signs: BP 105/73 (BP Location: Left Arm)   Pulse 67   Temp 98.1 F (36.7 C) (Oral)   Resp 18   Ht 5\' 7"  (1.702 m)   Wt 69.5 kg (153 lb 3.5 oz)   SpO2 100%   BMI 24.00 kg/m  SpO2:  SpO2: 100 % O2 Device: O2 Device: Nasal Cannula O2 Flow Rate: O2 Flow Rate (L/min): 2 L/min  Intake/output summary:   Intake/Output Summary (Last 24 hours) at 04/17/2017 0931 Last data filed at 04/17/2017 0432 Gross per 24 hour  Intake 2015 ml  Output -  Net 2015 ml   LBM: Last BM Date: (PTA) Baseline Weight: Weight: 61.7  kg (136 lb) Most recent weight: Weight: 69.5 kg (153 lb 3.5 oz)       Palliative Assessment/Data:    Flowsheet Rows     Most Recent Value  Intake Tab  Referral Department  Hospitalist  Unit at Time of Referral  Med/Surg Unit  Palliative Care Primary Diagnosis  Other (Comment) [dementia, PNA, FTT, poor PO intake, depression. ]  Palliative Care Type  New Palliative care  Reason for referral  Clarify Goals of Care  Date first seen by Palliative Care  04/16/17  Clinical Assessment  Palliative Performance Scale Score  20%  Pain Max last 24 hours  4  Pain Min Last 24 hours  3  Dyspnea Max Last 24 Hours  3  Dyspnea Min Last 24 hours  2  Nausea Max Last 24 Hours  0  Nausea Min Last 24 Hours  0  Anxiety Max Last 24 Hours  2  Anxiety Min Last 24 Hours  1  Psychosocial & Spiritual Assessment  Palliative Care Outcomes  Patient/Family meeting held?  No  Who was at the meeting?  patient is not awake/alert, not conversational, unable to reach family by phone, no family at bedside, will re attempt on 04-17-17       Patient Active Problem List   Diagnosis Date Noted  . FTT (failure to thrive) in adult 04/16/2017  . SOB (shortness of breath)   . Adjustment disorder with disturbance of conduct 04/10/2017  . AKI (acute kidney injury) ([place]) 04/10/2017  . CAP (community acquired pneumonia) 04/10/2017  . Acute metabolic encephalopathy 17/40/8144  . Malnutrition ([place]) 04/04/2017  . Abdominal distension (gaseous) 04/04/2017  . Dyspnea 04/04/2017  . Weight loss 02/22/2017  . Cough 02/22/2017  . Right hip pain 02/22/2017  . Acute pain of left shoulder 01/03/2017  .  Encephalopathy 11/18/2016  . Dehydration   . Low vitamin B12 level   . Low folate   . Acute encephalopathy 11/16/2016  . Preoperative clearance 10/21/2016  . Anemia 02/23/2016  . Aspiration pneumonitis ([place]) 02/15/2016  . Acute hypernatremia 02/15/2016  . CKD (chronic kidney disease), stage III ([place]) 02/15/2016  . Atrial fibrillation with RVR ([place])   . Cardiomyopathy, ischemic   . Chronic systolic CHF (congestive heart failure) ([place]) 10/11/2015  . Diplopia 10/11/2015  . Depression 07/07/2015  . Esophageal obstruction due to food impaction 01/24/2014  . Syncope 02/22/2013  . CAD (coronary artery disease) 02/22/2013  . GERD (gastroesophageal reflux disease) 02/22/2013  . OA (osteoarthritis) of knee 02/22/2013  . TIA (transient ischemic attack) 11/07/2011  . Urinary retention 04/13/2011  . NEVUS, ATYPICAL 08/26/2010  . ECZEMA 05/27/2010  . Atrial fibrillation ([place]) 04/29/2010  . ROTATOR CUFF INJURY, RIGHT SHOULDER 09/16/2009  . Essential hypertension 04/23/2009  . BENIGN PROSTATIC HYPERTROPHY, WITH OBSTRUCTION 04/23/2009  . ARTHRITIS 04/23/2009  . HYPERCHOLESTEROLEMIA, MIXED 04/22/2008  . Automatic implantable cardioverter-defibrillator in situ 04/22/2008   PPS 30% Palliative Care Assessment & Plan   Patient Profile:    Assessment:  ongoing acute metabolic encephalopathy Possible underlying dementia Depression, unresolved complicated grief following death of wife about 2 years ago Declining functional status, mostly bed bound since reported hip fracture, was to undergo knee replacement in the past Minimal to nil PO intake EF 20% had ICD which has been de activated.  AKI Hypernatremia   Recommendations/Plan:   Agree with DNR DNI, agree with ICD de activation.   Call placed and discussed in detail with son Keishon Chavarin as well as daughter [name]. The patient's POA  daughter Marcie Bal is currently at church, daughter [name] has requested that I discuss with her later this  afternoon. Janet's number is [phone].   Recommend residential hospice, wife died at [facility] less than 2 years ago. Pending additional discussions with daughter Marcie Bal, will request CSW consult for residential hospice soon.    Code Status:    Code Status Orders  (From admission, onward)        Start     Ordered   04/10/17 1402  Do not attempt resuscitation (DNR)  Continuous    Question Answer Comment  In the event of cardiac or respiratory ARREST Do not call a "code blue"   In the event of cardiac or respiratory ARREST Do not perform Intubation, CPR, defibrillation or ACLS   In the event of cardiac or respiratory ARREST Use medication by any route, position, wound care, and other measures to relive pain and suffering. May use oxygen, suction and manual treatment of airway obstruction as needed for comfort.      04/10/17 1404    Code Status History    Date Active Date Inactive Code Status Order ID Comments User Context   04/10/2017 05:43 04/10/2017 14:04 DNR [id number]  Shanon Rosser, MD ED   11/16/2016 19:36 11/19/2016 20:20 DNR [id number]  Vianne Bulls, MD ED   02/15/2016 02:09 02/16/2016 19:37 Partial Code [id number]  Edwin Dada, MD Inpatient   10/11/2015 12:55 10/17/2015 16:54 DNR [id number]  Kelvin Cellar, MD Inpatient   02/22/2013 18:46 02/24/2013 16:23 Full Code [id number]  Monika Salk, MD ED   11/07/2011 18:16 11/08/2011 17:39 Full Code [id number]  Remi Haggard, RN Inpatient   11/07/2011 18:16 11/07/2011 18:16 DNR [id number]  Remi Haggard, RN Inpatient    Advance Directive Documentation     Most Recent Value  Type of Advance Directive  Healthcare Power of [place]  Pre-existing out of facility DNR order (yellow form or pink MOST form)  No data  "MOST" Form in Place?  No data       Prognosis:   possibly 2 weeks.   Discharge Planning:  Likely residential hospice, pending additional discussions with POA daughter Marcie Bal [phone]   Care  plan was discussed with son Kyren Knick and daughter [name] over the phone at [phone].   Thank you for allowing the Palliative Medicine Team to assist in the care of this patient.   Time In: 9 Time Out: 9.35 Total Time 35 Prolonged Time Billed  no       Greater than 50%  of this time was spent counseling and coordinating care related to the above assessment and plan.  Loistine Chance, MD [phone]  Please contact Palliative Medicine Team phone at [phone] for questions and concerns.   Addendum: 04/17/17  Noon Discussed over the phone with patient's power of attorney daughter Marcie Bal a [phone]. We reviewed the patient's current hospitalization course. He'll be reviewed the patient's underlying medical conditions, essentially his ongoing gradual progressive decline since his wife died 2 years ago. We discussed about appropriate disposition planning and Steps keeping in mind the patient's wishes. Daughter states that herself and the entire family is leaning towards establishment of full comfort measures and transfer to residential hospice towards the end of this hospitalization. Will request social worker consult to help facilitate. Their first choices [facility] as the patient's wife died over there 2 years ago.

## 2017-04-17 NOTE — Progress Notes (Signed)
PROGRESS NOTE    QUINTIN HJORT  GHW:[id number] DOB: June 23, 1931 DOA: 04/10/2017 PCP: Binnie Rail, MD    Brief Narrative:  83 year old male with history of BPH, esophageal strictures, presented to ED with weakness and confusion. Patient was recently diagnosed and treated for pneumonia with doxycycline and started 04/04/2017. Because of altered mental status he was brought to ED for further evaluation. Chest x-ray showed possible pneumonia on the left side. CT head showed no acute intracranial findings. Patient was started on empiric doxycycline. Then hypothermia   Assessment & Plan:   Principal Problem:   Acute metabolic encephalopathy Active Problems:   Acute hypernatremia   GERD (gastroesophageal reflux disease)   Depression   Dehydration   Dyspnea   Adjustment disorder with disturbance of conduct   AKI (acute kidney injury) ([place])   CAP (community acquired pneumonia)   FTT (failure to thrive) in adult   SOB (shortness of breath)  #1 acute metabolic encephalopathy Likely multifactorial secondary to worsening pneumonia and hypernatremia, depression in the setting of baseline dementia and FTT. Patient more alert today than over the past 1-2 days.  Head CT negative for any acute abnormalities. Patient with no focal neurological deficits. Blood cultures negative 2 . Urinalysis nitrite negative, leukocytes negative. Patient noted to be on IV Levaquin which was subsequently changed to IV Rocephin and azithromycin to cover for pneumonia. Gentle hydration due to worsening hypernatremia. PT/OT. Supportive care.   #2 hypernatremia Likely secondary to volume depletion/dehydration secondary to poor oral intake and in the setting of diuretics. Diuretics were discontinued. Hypernatremia improved on D5W on 04/15/2017. Saline lock IV fluids secondary to shortness of breath and concern for volume overload on 04/15/2017 and patient given IV lasix. Hypernatremia worsened after diuretics. Gentle  hydration was resumed with D5W with improvement with hypernatremia. Monitor volume status closely.   #3 dehydration Gentle hydration.   #4 acute kidney injury Secondary to a prerenal azotemia in the setting of diuretics. Diuretics were held and patient hydrated with IV fluids with improving kidney function. Patient was short of breath the morning of 04/15/2017 and a such fluids were saline locked and patient given a dose of IV Lasix. Gentle hydration was resumed with renal function slowly improving. Monitor closely.  #5 shortness of breath Patient with shortness of breath the morning of 04/15/2017 per RN. Patient noted to have some bibasilar crackles then. Patient was being hydrated with IV fluids due to dehydration and hypernatremia. Saline lock IV fluids. Chest x-ray with no significant change since admission. Patient given a dose of IV Lasix. Worsening hypernatremia and as such D5W was resumed at 75 mL per hour. Monitor closely for volume overload.   #6 community acquired pneumonia Patient presented with a worsening pneumonia. Blood cultures negative.  Patient afebrile. Changed IV Levaquin to IV azithromycin and  IV Rocephin. Follow. Supportive care.  #7 iron deficiency anemia H&H stable. Follow.  #8 history of chronic systolic heart failure/ischemic cardiomyopathy Patient noted with complaints of shortness of breath the morning of 04/15/2017, likely secondary to aggressive hydration due to hypernatremia and dehydration. Patient's diuretics on hold. 2-D echo from 04/11/2017 with a EF of 20%, diffuse hypokinesis. Repeat chest x-ray done 04/15/2017 with persistent interstitial and hazy airspace opacities evident in the upper lobes associated mild cardiomegaly and small-to-moderate pleural effusions. Findings consistent with asymmetric pulmonary edema from CHF versus multifocal pneumonia. Patient given a dose of IV Lasix yesterday with a urine output of 1 L. Patient however with worsening  hypernatremia now in  as such started back on gentle hydration with D5W. Follow.   #9 depression Patient has been seen in consultation by psychiatry. Patient restarted on home regimen of Cymbalta 60 mg daily. Patient placed on Remeron sublingual 15 mg daily at bedtime for insomnia and poor appetite. Outpatient follow-up.  #10 failure to thrive Patient with failure to thrive symptoms per family. Patient has gone downhill since his wife died one to 2 years ago. Her family patient with significantly poor oral intake over the past several months and per family feel patient has given up his will to live. Patient also with a history of systolic heart failure/ischemic cardiomyopathy EF of 20% per 2-D occult 04/11/2017 with diffuse hypokinesis. Patient also with a defibrillator/AICD placement per family work will request in it to be turned off as patient is now a DO NOT RESUSCITATE. Patient admitted with significant hypernatremia, lethargy, depression as well as a pneumonia, metabolic encephalopathy and now with acute kidney injury. Per family patient with significant failure to thrive symptoms with poor oral intake. Patient with a poor prognosis. Palliative care was consulted on the patient and are following. ?? Residential Hospice    DVT prophylaxis: pradaxa Code Status: DO NOT RESUSCITATE Family Communication: Updated daughter at bedside. Disposition Plan: Likely residential hospice versus skilled nursing facility with hospice/palliative care following.   Consultants:   Psychiatry: Dr. Louretta Shorten 04/12/2017  Palliative care : Dr. Rowe Pavy 04/17/2017  Procedures:   CT head 04/10/2017  Chest x-ray 04/10/2017, 04/15/2017  2-D echo 04/11/2017    Antimicrobials:   Oral doxycycline 04/10/2017>>>>> 04/12/2017  IV azithromycin 04/15/2017  IV Rocephin 04/15/2017  IV Levaquin 04/13/2017>>>>> 04/14/2017   Subjective: Patient more alert today. Interacting. Following commands. States he does  have a poor appetite and is just not hungry. Denies any chest pain or shortness of breath.   Objective: Vitals:   04/16/17 1321 04/16/17 2238 04/17/17 0600 04/17/17 1310  BP:  111/64 105/73 97/60  Pulse:  67 67 68  Resp:  20 18 20   Temp:  97.9 F (36.6 C) 98.1 F (36.7 C) 98 F (36.7 C)  TempSrc:  Oral Oral Oral  SpO2: 95% 100% 100% 100%  Weight:      Height:        Intake/Output Summary (Last 24 hours) at 04/17/2017 1347 Last data filed at 04/17/2017 0432 Gross per 24 hour  Intake 1895 ml  Output -  Net 1895 ml   Filed Weights   04/14/17 0453 04/15/17 0600 04/16/17 0456  Weight: 66.1 kg (145 lb 12.8 oz) 67.6 kg (149 lb 0.5 oz) 69.5 kg (153 lb 3.5 oz)    Examination:  General exam: Alert. Cachectic Respiratory system: Clear to auscultation anterior lung fields. No wheezing. Fair air movement.  Cardiovascular system: Regular rate rhythm no murmurs rubs or gallops. No pedal edema. Gastrointestinal system: Abdomen is nondistended, soft and nontender. No organomegaly or masses felt. Normal bowel sounds heard. Central nervous system: Alert and oriented. No focal neurological deficits. Extremities: Symmetric 5 x 5 power. Skin: No rashes, lesions or ulcers Psychiatry: Judgement and insight appear fair. Mood & affect flat.     Data Reviewed: I have personally reviewed following labs and imaging studies  CBC: Recent Labs  Lab 04/12/17 0500 04/13/17 0534 04/14/17 0904 04/15/17 0509 04/16/17 0558  WBC 8.6 9.8 10.7* 9.8 10.5  NEUTROABS  --   --  8.9* 8.0* 8.7*  HGB 10.5* 10.6* 10.8* 10.9* 11.2*  HCT 33.9* 34.1* 34.8* 35.9* 36.4*  MCV 87.1 87.0 87.9  88.0 87.1  PLT 209 199 196 188 096   Basic Metabolic Panel: Recent Labs  Lab 04/10/17 1518  04/14/17 1651 04/15/17 0509 04/16/17 0558 04/16/17 1522 04/17/17 0424  NA  --    < > 148* 144 149* 144 144  K  --    < > 3.4* 3.5 3.5 3.5 3.6  CL  --    < > 118* 114* 114* 109 110  CO2  --    < > 23 19* 25 26 26   GLUCOSE   --    < > 150* 152* 93 174* 149*  BUN  --    < > 48* 43* 42* 41* 41*  CREATININE  --    < > 1.59* 1.55* 1.61* 1.77* 1.58*  CALCIUM  --    < > 8.6* 8.7* 8.5* 8.3* 8.2*  MG 1.9  --   --   --   --   --   --   PHOS 3.7  --   --   --   --   --   --    < > = values in this interval not displayed.   GFR: Estimated Creatinine Clearance: 33.1 mL/min (A) (by C-G formula based on SCr of 1.58 mg/dL (H)). Liver Function Tests: No results for input(s): AST, ALT, ALKPHOS, BILITOT, PROT, ALBUMIN in the last 168 hours. No results for input(s): LIPASE, AMYLASE in the last 168 hours. No results for input(s): AMMONIA in the last 168 hours. Coagulation Profile: No results for input(s): INR, PROTIME in the last 168 hours. Cardiac Enzymes: Recent Labs  Lab 04/10/17 1827 04/10/17 2359 04/11/17 0620  TROPONINI <0.03 0.03* 0.04*   BNP (last 3 results) No results for input(s): PROBNP in the last 8760 hours. HbA1C: No results for input(s): HGBA1C in the last 72 hours. CBG: No results for input(s): GLUCAP in the last 168 hours. Lipid Profile: No results for input(s): CHOL, HDL, LDLCALC, TRIG, CHOLHDL, LDLDIRECT in the last 72 hours. Thyroid Function Tests: No results for input(s): TSH, T4TOTAL, FREET4, T3FREE, THYROIDAB in the last 72 hours. Anemia Panel: No results for input(s): VITAMINB12, FOLATE, FERRITIN, TIBC, IRON, RETICCTPCT in the last 72 hours. Sepsis Labs: No results for input(s): PROCALCITON, LATICACIDVEN in the last 168 hours.  Recent Results (from the past 240 hour(s))  Culture, blood (Routine X 2) w Reflex to ID Panel     Status: None   Collection Time: 04/10/17 12:05 PM  Result Value Ref Range Status   Specimen Description BLOOD LEFT ARM  Final   Special Requests   Final    BOTTLES DRAWN AEROBIC AND ANAEROBIC Blood Culture adequate volume   Culture   Final    NO GROWTH 5 DAYS Performed at [facility], [address] [place], [place] [address]    Report Status 04/15/2017  FINAL  Final  Culture, blood (Routine X 2) w Reflex to ID Panel     Status: None   Collection Time: 04/10/17 12:40 PM  Result Value Ref Range Status   Specimen Description BLOOD RIGHT ANTECUBITAL  Final   Special Requests   Final    BOTTLES DRAWN AEROBIC AND ANAEROBIC Blood Culture adequate volume   Culture   Final    NO GROWTH 5 DAYS Performed at [facility], [place] [address], [place], [place] [address]    Report Status 04/15/2017 FINAL  Final         Radiology Studies: No results found.  Scheduled Meds: . aspirin  324 mg Oral Daily  . dabigatran  150 mg Oral BID  . DULoxetine  60 mg Oral Daily  . feeding supplement (ENSURE ENLIVE)  237 mL Oral TID BM  . finasteride  5 mg Oral Daily  . Influenza vac split quadrivalent PF  0.5 mL Intramuscular Tomorrow-1000  . isosorbide mononitrate  30 mg Oral Daily  . metoprolol tartrate  25 mg Oral BID  . mirtazapine  15 mg Oral QHS  . multivitamin with minerals  1 tablet Oral Daily  . pantoprazole  40 mg Oral Daily  . rosuvastatin  10 mg Oral QHS   Continuous Infusions: . azithromycin Stopped (04/17/17 1236)  . cefTRIAXone (ROCEPHIN)  IV Stopped (04/17/17 1205)  . dextrose 50 mL (04/17/17 0930)     LOS: 7 days    Time spent: 2 mins    THOMPSON,DANIEL, MD [facility] Pager [phone]  If 7PM-7AM, please contact night-coverage [email or web address] Password [email or web address] 04/17/2017, 1:47 PM

## 2017-04-17 NOTE — Progress Notes (Signed)
Pt sleeping most the day. Mouth care given, carmax applied to lips. Turned and repositioned q 3hr. Visitors in at bedside.Pt awoke around 1530.

## 2017-04-18 LAB — BASIC METABOLIC PANEL
Anion gap: 6 (ref 5–15)
BUN: 34 mg/dL — AB (ref 6–20)
CHLORIDE: 108 mmol/L (ref 101–111)
CO2: 26 mmol/L (ref 22–32)
CREATININE: 1.26 mg/dL — AB (ref 0.61–1.24)
Calcium: 8.4 mg/dL — ABNORMAL LOW (ref 8.9–10.3)
GFR calc Af Amer: 59 mL/min — ABNORMAL LOW (ref >60)
GFR calc non Af Amer: 51 mL/min — ABNORMAL LOW (ref >60)
GLUCOSE: 105 mg/dL — AB (ref 65–99)
POTASSIUM: 3.5 mmol/L (ref 3.5–5.1)
SODIUM: 140 mmol/L (ref 135–145)

## 2017-04-18 NOTE — Progress Notes (Signed)
CSW notified that pt's family has decided to pursue full comfort care and daughter reports having discussed Optometrist with palliative team. Spoke with daughter this morning and she confirms family wanting to transfer pt to residential hospice - CSW made referral and will follow.   Sharren Bridge, MSW, LCSW Clinical Social Work 04/18/2017 [phone]

## 2017-04-18 NOTE — Progress Notes (Signed)
[facility]  Received request from [place] for family interest in [facility]. Chart reviewed and met with family at bedside. They are very familiar with [facility] as multiple family members have been there. They are aware [facility] does not have room to offer at the moment. Will follow up with CSW and family in am.   Thank you,  Erling Conte, LCSW [phone]

## 2017-04-18 NOTE — Progress Notes (Signed)
PROGRESS NOTE    Jacob [name]  AYT:[id number] DOB: [birth year] DOA: 04/10/2017 PCP: Binnie Rail, MD    Brief Narrative:  83 year old male with history of BPH, esophageal strictures, presented to ED with weakness and confusion. Patient was recently diagnosed and treated for pneumonia with doxycycline and started 04/04/2017. Because of altered mental status he was brought to ED for further evaluation. Chest x-ray showed possible pneumonia on the left side. CT head showed no acute intracranial findings. Patient was started on empiric doxycycline. Then hypothermia   Assessment & Plan:   Principal Problem:   Acute metabolic encephalopathy Active Problems:   Acute hypernatremia   GERD (gastroesophageal reflux disease)   Depression   Dehydration   Dyspnea   Adjustment disorder with disturbance of conduct   AKI (acute kidney injury) ([place])   CAP (community acquired pneumonia)   FTT (failure to thrive) in adult   SOB (shortness of breath)  #1 acute metabolic encephalopathy Likely multifactorial secondary to worsening pneumonia and hypernatremia, depression in the setting of baseline dementia and FTT. Patient more alert today than over the past 2-3 days.  Head CT negative for any acute abnormalities. Patient with no focal neurological deficits. Blood cultures negative 2 . Urinalysis nitrite negative, leukocytes negative. Patient noted to be on IV Levaquin which was subsequently changed to IV Rocephin and azithromycin to cover for pneumonia. Gentle hydration due to worsening hypernatremia. PT/OT. Supportive care. Palliative care have met with family and family have decided on full comfort measures. Awaiting placement in a residential hospice home.  #2 hypernatremia Likely secondary to volume depletion/dehydration secondary to poor oral intake and in the setting of diuretics. Diuretics were discontinued. Hypernatremia improved on D5W on 04/15/2017. Saline lock IV fluids secondary to shortness  of breath and concern for volume overload on 04/15/2017 and patient given IV lasix. Hypernatremia worsened after diuretics. Gentle hydration was resumed with D5W with improvement with hypernatremia. Decrease IV fluids to 50 mL per hour. Monitor volume status closely.   #3 dehydration Gentle hydration.   #4 acute kidney injury Secondary to a prerenal azotemia in the setting of diuretics. Diuretics were held and patient hydrated with IV fluids with improving kidney function. Patient was short of breath the morning of 04/15/2017 and a such fluids were saline locked and patient given a dose of IV Lasix. Gentle hydration was resumed with renal function slowly improving daily. Monitor closely.  #5 shortness of breath Patient with shortness of breath the morning of 04/15/2017 per RN. Patient noted to have some bibasilar crackles then. Patient was being hydrated with IV fluids due to dehydration and hypernatremia. Saline lock IV fluids. Chest x-ray with no significant change since admission. Patient given a dose of IV Lasix. Worsening hypernatremia and as such D5W was resumed at 75 mL per hour. Decreased D5W to 50 mL per hour. Patient now full comfort measures.   #6 community acquired pneumonia Patient presented with a worsening pneumonia. Blood cultures negative.  Patient afebrile. Changed IV Levaquin to IV azithromycin and  IV Rocephin. Discontinue IV antibiotics after today's dose as patient would've completed a course of antibiotic treatment.   #7 iron deficiency anemia H&H stable. Follow.  #8 history of chronic systolic heart failure/ischemic cardiomyopathy Patient noted with complaints of shortness of breath the morning of 04/15/2017, likely secondary to aggressive hydration due to hypernatremia and dehydration. Patient's diuretics on hold. 2-D echo from 04/11/2017 with a EF of 20%, diffuse hypokinesis. Repeat chest x-ray done 04/15/2017 with persistent interstitial and  hazy airspace opacities evident  in the upper lobes associated mild cardiomegaly and small-to-moderate pleural effusions. Findings consistent with asymmetric pulmonary edema from CHF versus multifocal pneumonia. Patient given a dose of IV Lasix yesterday with a urine output of 1 L. Patient however with worsening hypernatremia now in as such started back on gentle hydration with D5W. Patient's AICD has been turned off per family request as patient is now a DO NOT RESUSCITATE. Patient now full comfort measures. Likely to residential hospice home when bed available.   #9 depression Patient has been seen in consultation by psychiatry. Patient restarted on home regimen of Cymbalta 60 mg daily. Patient placed on Remeron sublingual 15 mg daily at bedtime for insomnia and poor appetite. Patient now full comfort measures.  #10 failure to thrive Patient with failure to thrive symptoms per family. Patient has gone downhill since his wife died one to 2 years ago. Her family patient with significantly poor oral intake over the past several months and per family feel patient has given up his will to live. Patient also with a history of systolic heart failure/ischemic cardiomyopathy EF of 20% per 2-D occult 04/11/2017 with diffuse hypokinesis. Patient also with a defibrillator/AICD placement per family work will request in it to be turned off as patient is now a DO NOT RESUSCITATE. Patient admitted with significant hypernatremia, lethargy, depression as well as a pneumonia, metabolic encephalopathy and now with acute kidney injury. Per family patient with significant failure to thrive symptoms with poor oral intake. Patient with a poor prognosis. Palliative care was consulted on the patient and family have decided on full comfort measures. Patient likely to residential hospice home when bed available.     DVT prophylaxis: pradaxa Code Status: DO NOT RESUSCITATE Family Communication: Updated daughter/family at bedside. Disposition Plan: Likely  residential hospice home.   Consultants:   Psychiatry: Dr. Louretta Shorten 04/12/2017  Palliative care : Dr. Rowe Pavy 04/17/2017  Procedures:   CT head 04/10/2017  Chest x-ray 04/10/2017, 04/15/2017  2-D echo 04/11/2017    Antimicrobials:   Oral doxycycline 04/10/2017>>>>> 04/12/2017  IV azithromycin 04/15/2017>>>>>> 04/18/2017  IV Rocephin 04/15/2017>>>>> 04/18/2017  IV Levaquin 04/13/2017>>>>> 04/14/2017   Subjective: Patient sitting up in chair alert. Family at bedside. Denies chest pain or shortness of breath.  Objective: Vitals:   04/17/17 1310 04/17/17 2129 04/18/17 0554 04/18/17 1025  BP: 97/60 124/74 113/66   Pulse: 68 81 71   Resp: _0 Temp: 98 F (36.7 C) 98 F (36.7 C) 98.1 F (36.7 C)   TempSrc: Oral Oral Oral   SpO2: 100% 97% 100% 90%  Weight:   74.1 kg (163 lb 5.8 oz)   Height:        Intake/Output Summary (Last 24 hours) at 04/18/2017 1204 Last data filed at 04/18/2017 0752 Gross per 24 hour  Intake 2012.08 ml  Output 10 ml  Net 2002.08 ml   Filed Weights   04/15/17 0600 04/16/17 0456 04/18/17 0554  Weight: 67.6 kg (149 lb 0.5 oz) 69.5 kg (153 lb 3.5 oz) 74.1 kg (163 lb 5.8 oz)    Examination:  General exam: Alert. Cachectic Respiratory system: Clear to auscultation anterior lung fields. No wheezing. Fair air movement.  Cardiovascular system: Regular rate rhythm no murmurs rubs or gallops. No pedal edema. Gastrointestinal system: Abdomen is soft, nontender, nondistended, positive bowel sounds.  Central nervous system: Alert and oriented. No focal neurological deficits. Extremities: Symmetric 5 x 5 power. Skin: No rashes, lesions or ulcers Psychiatry: Judgement  and insight appear fair. Mood & affect flat.     Data Reviewed: I have personally reviewed following labs and imaging studies  CBC: Recent Labs  Lab 04/12/17 0500 04/13/17 0534 04/14/17 0904 04/15/17 0509 04/16/17 0558  WBC 8.6 9.8 10.7* 9.8 10.5  NEUTROABS  --    --  8.9* 8.0* 8.7*  HGB 10.5* 10.6* 10.8* 10.9* 11.2*  HCT 33.9* 34.1* 34.8* 35.9* 36.4*  MCV 87.1 87.0 87.9 88.0 87.1  PLT 209 199 196 188 456   Basic Metabolic Panel: Recent Labs  Lab 04/15/17 0509 04/16/17 0558 04/16/17 1522 04/17/17 0424 04/18/17 0453  NA 144 149* 144 144 140  K 3.5 3.5 3.5 3.6 3.5  CL 114* 114* 109 110 108  CO2 19* _0 GLUCOSE 152* 93 174* 149* 105*  BUN 43* 42* 41* 41* 34*  CREATININE 1.55* 1.61* 1.77* 1.58* 1.26*  CALCIUM 8.7* 8.5* 8.3* 8.2* 8.4*   GFR: Estimated Creatinine Clearance: 41.5 mL/min (A) (by C-G formula based on SCr of 1.26 mg/dL (H)). Liver Function Tests: No results for input(s): AST, ALT, ALKPHOS, BILITOT, PROT, ALBUMIN in the last 168 hours. No results for input(s): LIPASE, AMYLASE in the last 168 hours. No results for input(s): AMMONIA in the last 168 hours. Coagulation Profile: No results for input(s): INR, PROTIME in the last 168 hours. Cardiac Enzymes: No results for input(s): CKTOTAL, CKMB, CKMBINDEX, TROPONINI in the last 168 hours. BNP (last 3 results) No results for input(s): PROBNP in the last 8760 hours. HbA1C: No results for input(s): HGBA1C in the last 72 hours. CBG: No results for input(s): GLUCAP in the last 168 hours. Lipid Profile: No results for input(s): CHOL, HDL, LDLCALC, TRIG, CHOLHDL, LDLDIRECT in the last 72 hours. Thyroid Function Tests: No results for input(s): TSH, T4TOTAL, FREET4, T3FREE, THYROIDAB in the last 72 hours. Anemia Panel: No results for input(s): VITAMINB12, FOLATE, FERRITIN, TIBC, IRON, RETICCTPCT in the last 72 hours. Sepsis Labs: No results for input(s): PROCALCITON, LATICACIDVEN in the last 168 hours.  Recent Results (from the past 240 hour(s))  Culture, blood (Routine X 2) w Reflex to ID Panel     Status: None   Collection Time: 04/10/17 12:05 PM  Result Value Ref Range Status   Specimen Description BLOOD LEFT ARM  Final   Special Requests   Final    BOTTLES DRAWN  AEROBIC AND ANAEROBIC Blood Culture adequate volume   Culture   Final    NO GROWTH 5 DAYS Performed at [facility], [address] [place], [place] [address]    Report Status 04/15/2017 FINAL  Final  Culture, blood (Routine X 2) w Reflex to ID Panel     Status: None   Collection Time: 04/10/17 12:40 PM  Result Value Ref Range Status   Specimen Description BLOOD RIGHT ANTECUBITAL  Final   Special Requests   Final    BOTTLES DRAWN AEROBIC AND ANAEROBIC Blood Culture adequate volume   Culture   Final    NO GROWTH 5 DAYS Performed at [facility], [place] [address], [place], [place] [address]    Report Status 04/15/2017 FINAL  Final         Radiology Studies: No results found.      Scheduled Meds: . aspirin  324 mg Oral Daily  . dabigatran  150 mg Oral BID  . DULoxetine  60 mg Oral Daily  . feeding supplement (ENSURE ENLIVE)  237 mL Oral TID BM  . finasteride  5 mg Oral Daily  . Influenza vac split quadrivalent PF  0.5 mL Intramuscular Tomorrow-1000  . isosorbide mononitrate  30 mg Oral Daily  . metoprolol tartrate  25 mg Oral BID  . mirtazapine  15 mg Oral QHS  . multivitamin with minerals  1 tablet Oral Daily  . pantoprazole  40 mg Oral Daily  . rosuvastatin  10 mg Oral QHS   Continuous Infusions: . azithromycin 500 mg (04/18/17 1145)  . cefTRIAXone (ROCEPHIN)  IV Stopped (04/17/17 1205)  . dextrose 50 mL/hr at 04/18/17 0845     LOS: 8 days    Time spent: 63 mins    THOMPSON,DANIEL, MD [facility] Pager [phone]  If 7PM-7AM, please contact night-coverage [email or web address] Password [email or web address] 04/18/2017, 12:04 PM

## 2017-04-18 NOTE — Progress Notes (Signed)
Physical Therapy Treatment Patient Details Name: Jacob [name] MRN: [id number] DOB: December 12, 1931 Today's Date: 04/18/2017    History of Present Illness Jacob [name] is a 83 y.o. male past medical history significant for BPH, CHF, CAD, TIA's HTN,esophageal stricture presented to the emergency room for weakness and confusion, found outside trying to climb into a window.. CT head negative. Has been treated for PNA recently    PT Comments    +2 total assist for bed to recliner transfer, pt with heavy lean to left in supine and sitting, unable to stand today with +2 assist and 3 attempts. Performed seated LE exercises. Noted rash R lower back, RN notified.   Follow Up Recommendations  SNF     Equipment Recommendations  None recommended by PT    Recommendations for Other Services       Precautions / Restrictions Precautions Precautions: Fall Precaution Comments: Impaired cognition Restrictions Weight Bearing Restrictions: No    Mobility  Bed Mobility Overal bed mobility: Needs Assistance Bed Mobility: Supine to Sit     Supine to sit: Mod assist;+2 for physical assistance;HOB elevated     General bed mobility comments:  assist legs over bed edge and assist trunk to sit upright    poor balance with heavy left lateral lean with no corrective response, pt was also leaning left in supine in bed  Transfers Overall transfer level: Needs assistance Equipment used: Rolling walker (2 wheeled) Transfers: Sit to/from [facility] Sit to Stand: Mod assist;+2 physical assistance;Total assist Stand pivot transfers: Mod assist;Max assist;+2 physical assistance       General transfer comment: pt unable to come to standing position with +2 total assist (pt 10%) x 3 trials, hips/knees remained flexed; so performed side by side SPT with +2 total assist; lift pad in recliner  Ambulation/Gait Ambulation/Gait assistance: Mod assist;+2 physical assistance;+2 safety/equipment        Gait velocity: decreased.     General Gait Details: unable   Stairs            Wheelchair Mobility    Modified Rankin (Stroke Patients Only)       Balance Overall balance assessment: Needs assistance Sitting-balance support: Feet supported;Bilateral upper extremity supported Sitting balance-Leahy Scale: Zero Sitting balance - Comments: heavy lean L; max A to maintain neutral, pt sat on EOB x 10 min Postural control: Left lateral lean Standing balance support: Bilateral upper extremity supported Standing balance-Leahy Scale: Zero Standing balance comment: unable to come to full stand with +2 assist and RW                            Cognition Arousal/Alertness: Awake/alert Behavior During Therapy: Flat affect Overall Cognitive Status: Impaired/Different from baseline Area of Impairment: Orientation;Following commands;Attention;Safety/judgement;Memory;Awareness;Problem solving                 Orientation Level: Place;Time;Situation Current Attention Level: Alternating   Following Commands: Follows one step commands inconsistently       General Comments: Required one step repeated.      Exercises General Exercises - Lower Extremity Long Arc Quad: AROM;15 reps;Both;Seated Shoulder Exercises Shoulder Flexion: AAROM;Both;5 reps;Supine    General Comments General comments (skin integrity, edema, etc.): SaO2 90% on RA at rest; noted rash R lower back, RN and NT notified      Pertinent Vitals/Pain Pain Assessment: No/denies pain    Home Living  Prior Function            PT Goals (current goals can now be found in the care plan section) Acute Rehab PT Goals PT Goal Formulation: Patient unable to participate in goal setting Time For Goal Achievement: 04/25/17 Potential to Achieve Goals: Fair Progress towards PT goals: Not progressing toward goals - comment(decline in activity tolerance today)     Frequency    Min 2X/week      PT Plan Current plan remains appropriate    Co-evaluation              AM-PAC PT "6 Clicks" Daily Activity  Outcome Measure  Difficulty turning over in bed (including adjusting bedclothes, sheets and blankets)?: Unable Difficulty moving from lying on back to sitting on the side of the bed? : Unable Difficulty sitting down on and standing up from a chair with arms (e.g., wheelchair, bedside commode, etc,.)?: Unable Help needed moving to and from a bed to chair (including a wheelchair)?: Total Help needed walking in hospital room?: Total Help needed climbing 3-5 steps with a railing? : Total 6 Click Score: 6    End of Session Equipment Utilized During Treatment: Gait belt;Oxygen Activity Tolerance: Patient tolerated treatment well Patient left: with call bell/phone within reach;in chair;with chair alarm set Nurse Communication: Mobility status PT Visit Diagnosis: Unsteadiness on feet (R26.81);Difficulty in walking, not elsewhere classified (R26.2);Muscle weakness (generalized) (M62.81)     Time: 7741-2878 PT Time Calculation (min) (ACUTE ONLY): 41 min  Charges:  $Therapeutic Exercise: 8-22 mins $Therapeutic Activity: 23-37 mins                    G Codes:          Philomena Doheny 04/18/2017, 10:35 AM [phone]

## 2017-04-19 DIAGNOSIS — K219 Gastro-esophageal reflux disease without esophagitis: Secondary | ICD-10-CM

## 2017-04-19 MED ORDER — FUROSEMIDE 20 MG PO TABS: 20.0000 mg | ORAL_TABLET | Freq: Every day | ORAL | Status: DC

## 2017-04-19 MED ORDER — HYDROCODONE-ACETAMINOPHEN 10-325 MG PO TABS: 1.0000 | ORAL_TABLET | Freq: Four times a day (QID) | ORAL | 0 refills | Status: AC | PRN

## 2017-04-19 MED ORDER — FUROSEMIDE 20 MG PO TABS: 20.0000 mg | ORAL_TABLET | ORAL | Status: AC

## 2017-04-19 MED ORDER — MIRTAZAPINE 15 MG PO TBDP: 15.0000 mg | ORAL_TABLET | Freq: Every day | ORAL | 0 refills | Status: AC

## 2017-04-19 MED ORDER — LORAZEPAM 0.5 MG PO TABS: 0.5000 mg | ORAL_TABLET | Freq: Three times a day (TID) | ORAL | 0 refills | Status: AC | PRN

## 2017-04-19 NOTE — Progress Notes (Signed)
[facility]  Continue to follow for family interest in [facility]. Unfortunately [facility] does not have room to offer Mr. Adames this morning. Will continue to follow until disposition determined. CSW Meghan aware.   Thank you,  Erling Conte, LCSW [phone]

## 2017-04-19 NOTE — Progress Notes (Signed)
Pt discharged and will transfer to admit to [facility].  RN has given report. Hospice liaison has received DC summary for facility.  CSW completed medical necessity form and arranged transportation. Pt's family at bedside today agreed to plan.  Sharren Bridge, MSW, LCSW Clinical Social Work 04/19/2017 [phone]

## 2017-04-19 NOTE — Consult Note (Signed)
   [facility] CM Inpatient Consult   04/19/2017  BANKS CHAIKIN 04/01/1932 [id number]    Patient screened for potential [facility] Care Management services.   Chart reviewed. Noted Mr. Midgley is full comfort measures and disposition is residential hospice.  No identifiable [facility] Care Management needs at this time.    Marthenia Rolling, MSN-Ed, RN,BSN [facility] Liaison [phone]

## 2017-04-19 NOTE — Discharge Summary (Signed)
Physician Discharge Summary  Jacob [name] RJP:[id number] DOB: Jun 12, 1932 DOA: 04/10/2017  PCP: Binnie Rail, MD  Admit date: 04/10/2017 Discharge date: 04/19/2017  Time spent: 65 minutes  Recommendations for Outpatient Follow-up:  1. Patient will be discharged to a residential hospice home. Patient is to follow-up with M.D. at residential hospice.   Discharge Diagnoses:  Principal Problem:   Acute metabolic encephalopathy Active Problems:   Acute hypernatremia   GERD (gastroesophageal reflux disease)   Depression   Dehydration   Dyspnea   Adjustment disorder with disturbance of conduct   AKI (acute kidney injury) ([place])   CAP (community acquired pneumonia)   FTT (failure to thrive) in adult   SOB (shortness of breath)   Discharge Condition: Stable.  Diet recommendation: Regular  Filed Weights   04/15/17 0600 04/16/17 0456 04/18/17 0554  Weight: 67.6 kg (149 lb 0.5 oz) 69.5 kg (153 lb 3.5 oz) 74.1 kg (163 lb 5.8 oz)    History of present illness:  Per Dr. Noni Saupe is a 83 y.o. male past medical history significant for BPH, CHF, esophageal stricture presented to the emergency room for weakness and confusion.  Patient was recently diagnosed and being treated for pneumonia with doxycycline started on 10/22.  The day he was brought to the emergency room he had been seen outside try to climb through a window of his house in his underwear.  Patient was disoriented.  ED course: Chest x-ray show remaining a pneumonia CT head showed no acute stroke.  Hospitalist consulted for admission    Hospital Course:  #1 acute metabolic encephalopathy Likely multifactorial secondary to worsening pneumonia and hypernatremia, depression in the setting of baseline dementia and FTT. Patient more alert today than over the past 3-4 days.  Head CT negative for any acute abnormalities. Patient with no focal neurological deficits. Blood cultures negative 2 . Urinalysis nitrite  negative, leukocytes negative. Patient noted to be on IV Levaquin which was subsequently changed to IV Rocephin and azithromycin to cover for pneumonia. Patient received a full course of antibiotic treatment no further antibiotics required on discharge. Palliative care have met with family and family have decided on full comfort measures. Patient will be discharged to a residential hospice home.   #2 hypernatremia Likely secondary to volume depletion/dehydration secondary to poor oral intake and in the setting of diuretics. Diuretics were discontinued. Hypernatremia improved on D5W on 04/15/2017. Saline lock IV fluids secondary to shortness of breath and concern for volume overload on 04/15/2017 and patient given IV lasix. Hypernatremia worsened after diuretics. Gentle hydration was resumed with D5W with improvement and resolution of hypernatremia. Patient now full comfort measures be discharged to a residential hospice home.  #3 dehydration Patient was gently hydrated.   #4 acute kidney injury Secondary to a prerenal azotemia in the setting of diuretics. Diuretics were held and patient hydrated with IV fluids with improving kidney function. Patient was short of breath the morning of 04/15/2017 and a such fluids were saline locked and patient given a dose of IV Lasix. Gentle hydration was resumed with renal function improved on a daily basis such that by day of discharge creatinine was down to 1.26. Patient now full comfort measures and will be discharged to a residential hospice home.   #5 shortness of breath Patient with shortness of breath the morning of 04/15/2017 per RN. Patient noted to have some bibasilar crackles then. Patient was being hydrated with IV fluids due to dehydration and hypernatremia. Saline lock IV  fluids. Chest x-ray with no significant change since admission. Patient given a dose of IV Lasix. Worsening hypernatremia and as such D5W was resumed at 75 mL per hour.  patient improved  clinically D5W was subsequently discontinued. Patient now full comfort measures.   #6 community acquired pneumonia Patient presented with a worsening pneumonia. Blood cultures negative.  Patient afebrile. Changed IV Levaquin to IV azithromycin and  IV Rocephin. Patient received a full course of antibiotic treatment. No further antibiotics needed.   #7 iron deficiency anemia H&H stable. Follow.  #8 history of chronic systolic heart failure/ischemic cardiomyopathy Patient noted with complaints of shortness of breath the morning of 04/15/2017, likely secondary to aggressive hydration due to hypernatremia and dehydration. Patient's diuretics on hold. 2-D echo from 04/11/2017 with a EF of 20%, diffuse hypokinesis. Repeat chest x-ray done 04/15/2017 with persistent interstitial and hazy airspace opacities evident in the upper lobes associated mild cardiomegaly and small-to-moderate pleural effusions. Findings consistent with asymmetric pulmonary edema from CHF versus multifocal pneumonia. Patient given a dose of IV Lasix  with a urine output of 1 L. Patient however with worsening hypernatremia and as such started back on gentle hydration with D5W. Patient's AICD has been turned off per family request as patient is now a DO NOT RESUSCITATE. Patient now full comfort measures. Patient's diuretics have been changed to 20 mg of Lasix every other day to begin on 04/21/2017. Patient will be discharged to residential hospice home.  #9 depression Patient has been seen in consultation by psychiatry. Patient restarted on home regimen of Cymbalta 60 mg daily. Patient placed on Remeron sublingual 15 mg daily at bedtime for insomnia and poor appetite. Patient now full comfort measures.  #10 failure to thrive Patient with failure to thrive symptoms per family. Patient has gone downhill since his wife died one to 2 years ago. Her family patient with significantly poor oral intake over the past several months and per  family feel patient has given up his will to live. Patient also with a history of systolic heart failure/ischemic cardiomyopathy EF of 20% per 2-D occult 04/11/2017 with diffuse hypokinesis. Patient also with a defibrillator/AICD placement per family work will request in it to be turned off as patient is now a DO NOT RESUSCITATE. Patient admitted with significant hypernatremia, lethargy, depression as well as a pneumonia, metabolic encephalopathy and now with acute kidney injury. Per family patient with significant failure to thrive symptoms with poor oral intake. Patient with a poor prognosis. Palliative care was consulted on the patient and family have decided on full comfort measures. Patient will be discharged to residential hospice home.      Procedures:  CT head 04/10/2017  Chest x-ray 04/10/2017, 04/15/2017  2-D echo 04/11/2017        Consultations:  Psychiatry: Dr. Louretta Shorten 04/12/2017  Palliative care : Dr. Rowe Pavy 04/17/2017      Discharge Exam: Vitals:   04/18/17 2131 04/19/17 0705  BP: 98/77 109/62  Pulse: 62 66  Resp: 20 20  Temp: (!) 96.8 F (36 C) 97.8 F (36.6 C)  SpO2: 100% 98%    General: NAD Cardiovascular: RRR Respiratory: CTAB  Discharge Instructions   Discharge Instructions    Diet general   Complete by:  As directed    Increase activity slowly   Complete by:  As directed      Current Discharge Medication List    START taking these medications   Details  LORazepam (ATIVAN) 0.5 MG tablet Take 1 tablet (0.5 mg  total) every 8 (eight) hours as needed by mouth for anxiety. Qty: 20 tablet, Refills: 0    mirtazapine (REMERON SOL-TAB) 15 MG disintegrating tablet Take 1 tablet (15 mg total) at bedtime by mouth. Qty: 30 tablet, Refills: 0      CONTINUE these medications which have CHANGED   Details  furosemide (LASIX) 20 MG tablet Take 1 tablet (20 mg total) every other day by mouth. Qty: 30 tablet    HYDROcodone-acetaminophen  (NORCO) 10-325 MG tablet Take 1 tablet 4 (four) times daily as needed by mouth. Qty: 15 tablet, Refills: 0      CONTINUE these medications which have NOT CHANGED   Details  dabigatran (PRADAXA) 150 MG CAPS capsule Take 1 capsule (150 mg total) by mouth 2 (two) times daily. Qty: 180 capsule, Refills: 1    DULoxetine (CYMBALTA) 60 MG capsule Take 1 capsule (60 mg total) by mouth daily. Qty: 90 capsule, Refills: 1    feeding supplement, ENSURE ENLIVE, (ENSURE ENLIVE) LIQD Take 237 mLs by mouth 2 (two) times daily between meals. Qty: 237 mL, Refills: 12    finasteride (PROSCAR) 5 MG tablet TAKE 1 TABLET(5 MG) BY MOUTH DAILY Qty: 90 tablet, Refills: 3    fluticasone (FLONASE) 50 MCG/ACT nasal spray SHAKE LIQUID AND USE 2 SPRAYS IN EACH NOSTRIL DAILY AS NEEDED FOR ALLERGIES OR RHINITIS Qty: 16 g, Refills: 0    isosorbide mononitrate (IMDUR) 30 MG 24 hr tablet TAKE 1 TABLET(30 MG) BY MOUTH DAILY Qty: 90 tablet, Refills: 0    metoprolol tartrate (LOPRESSOR) 25 MG tablet TAKE 1 TABLET BY MOUTH TWICE DAILY Qty: 60 tablet, Refills: 4    Multiple Vitamin (MULTIVITAMIN WITH MINERALS) TABS tablet Take 1 tablet by mouth daily. Qty: 30 tablet, Refills: 0    nitroGLYCERIN (NITROSTAT) 0.4 MG SL tablet PLACE 1 TABLET UNDER THE TONGUE EVERY 5 MINUTES FOR 3 DOSES AS NEEDED FOR CHEST PAIN Qty: 275 tablet, Refills: 0    omeprazole (PRILOSEC) 40 MG capsule TAKE 1 CAPSULE(40 MG) BY MOUTH DAILY Qty: 30 capsule, Refills: 0    ranitidine (ZANTAC) 300 MG tablet TAKE 1 TABLET(300 MG) BY MOUTH AT BEDTIME Qty: 90 tablet, Refills: 3    rosuvastatin (CRESTOR) 10 MG tablet TAKE 1 TABLET BY MOUTH AT BEDTIME Qty: 30 tablet, Refills: 8    sucralfate (CARAFATE) 1 GM/10ML suspension Take 10 mLs (1 g total) by mouth 4 (four) times daily -  with meals and at bedtime. Qty: 420 mL, Refills: 0      STOP taking these medications     doxycycline (VIBRA-TABS) 100 MG tablet      QUEtiapine (SEROQUEL) 25 MG tablet         Allergies  Allergen Reactions  . Bactrim [Sulfamethoxazole-Trimethoprim] Other (See Comments)    Throat swelling  . Penicillins Itching, Swelling and Rash    Has patient had a PCN reaction causing immediate rash, facial/tongue/throat swelling, SOB or lightheadedness with hypotension:unsure Has patient had a PCN reaction causing severe rash involving mucus membranes or skin necrosis:No Has patient had a PCN reaction that required hospitalization:No Has patient had a PCN reaction occurring within the last 10 years:No If all of the above answers are "NO", then may proceed with Cephalosporin use.    Follow-up Information    MD AT [place]. Follow up.            The results of significant diagnostics from this hospitalization (including imaging, microbiology, ancillary and laboratory) are listed below for reference.  Significant Diagnostic Studies: Dg Chest 2 View  Result Date: 04/15/2017 CLINICAL DATA:  Altered mental status, lethargy, recent pneumonia EXAM: CHEST  2 VIEW COMPARISON:  04/10/2017 FINDINGS: Mild cardiomegaly, stable. Left anterior chest wall AICD is stable. No mediastinal or hilar masses. Small to moderate bilateral pleural effusions. Persistent interstitial and hazy airspace opacities in the upper lobes. There is central vascular congestion. Interstitial thickening is noted in the lower lungs. No pneumothorax. Skeletal structures are demineralized but grossly intact. IMPRESSION: 1. Findings are similar to the prior study. 2. Persistent interstitial and hazy airspace opacities most evident in the upper lobes associated with mild cardiomegaly and small to moderate pleural effusions. Findings consistent with asymmetric pulmonary edema from congestive heart failure versus multifocal pneumonia. Electronically Signed   By: Lajean Manes M.D.   On: 04/15/2017 13:48   Dg Chest 2 View  Result Date: 04/10/2017 CLINICAL DATA:  Altered mental status, possible  lung infection EXAM: CHEST  2 VIEW COMPARISON:  04/06/2017 CT, 04/04/2017 FINDINGS: Left-sided pacing device as before. Similar appearance of upper lobe interstitial and alveolar opacity. Increasing opacity at the left greater than right lung base. Development of small pleural effusions. Mild cardiomegaly with aortic atherosclerosis. No pneumothorax. IMPRESSION: 1. Development of small pleural effusions. 2. Similar appearance of biapical interstitial and alveolar opacity 3. Worsening atelectasis or infiltrate at the left greater than right lung base Electronically Signed   By: Donavan Foil M.D.   On: 04/10/2017 01:35   Dg Chest 2 View  Result Date: 04/04/2017 CLINICAL DATA:  Cough EXAM: CHEST  2 VIEW COMPARISON:  02/22/2017, 11/16/2016, 11/04/2016 FINDINGS: Left-sided duo lead pacing device as before. Hyperinflation. Interstitial and alveolar opacity in the bilateral upper lobes. Stable cardiomediastinal silhouette with atherosclerosis. No pneumothorax. No pleural effusion. Old appearing left lower rib fractures IMPRESSION: 1. Interstitial and alveolar opacity within the bilateral upper lobes right greater than left suspicious for pneumonia; radiographic follow-up to resolution is recommended. Electronically Signed   By: Donavan Foil M.D.   On: 04/04/2017 20:52   Ct Head Wo Contrast  Result Date: 04/10/2017 CLINICAL DATA:  Worsening confusion. History of hypertension, stroke, AICD. EXAM: CT HEAD WITHOUT CONTRAST TECHNIQUE: Contiguous axial images were obtained from the base of the skull through the vertex without intravenous contrast. COMPARISON:  CT HEAD November 16, 2016 FINDINGS: BRAIN: No intraparenchymal hemorrhage, mass effect nor midline shift. Moderate to severe parenchymal brain volume loss similar to prior examination, no hydrocephalus. Old bilateral basal ganglia lacunar infarcts. Confluent supratentorial white matter hypodensities. No acute large vascular territory infarcts. No abnormal  extra-axial fluid collections. VASCULAR: Moderate calcific atherosclerosis of the carotid siphons. SKULL: No skull fracture. No significant scalp soft tissue swelling. SINUSES/ORBITS: The mastoid air-cells and included paranasal sinuses are well-aerated.The included ocular globes and orbital contents are non-suspicious. Status post bilateral ocular lens implants. OTHER: None. IMPRESSION: 1. No acute intracranial process. 2. Stable moderate to severe parenchymal brain volume loss. 3. Moderate to severe chronic small vessel ischemic disease and old lacunar infarcts. Electronically Signed   By: Elon Alas M.D.   On: 04/10/2017 02:23   Ct Chest W Contrast  Result Date: 04/06/2017 CLINICAL DATA:  Weight loss, abdominal pain, distention. Shortness of breath. EXAM: CT CHEST, ABDOMEN, AND PELVIS WITH CONTRAST TECHNIQUE: Multidetector CT imaging of the chest, abdomen and pelvis was performed following the standard protocol during bolus administration of intravenous contrast. CONTRAST:  110m ISOVUE-300 IOPAMIDOL (ISOVUE-300) INJECTION 61% COMPARISON:  Chest x-ray 04/04/2017 FINDINGS: CT CHEST FINDINGS Cardiovascular: Cardiomegaly. Left  pacer in place with leads in the right atrium and right ventricle. Atherosclerotic disease throughout the aorta. No aneurysm or dissection. Mediastinum/Nodes: Borderline sized pretracheal lymph node, 11 mm on image 21. No axillary or hilar adenopathy. Lungs/Pleura: Interlobular septal thickening and ground-glass opacities noted in the upper lobes bilaterally, up with sparing of the mid and lower lungs. Musculoskeletal: No acute bony abnormality. Degenerative changes in the thoracic spine. CT ABDOMEN PELVIS FINDINGS Hepatobiliary: Head probable small stone layering in the gallbladder. No focal hepatic abnormality. Pancreas: No focal abnormality or ductal dilatation. Spleen: No focal abnormality.  Normal size. Adrenals/Urinary Tract: Numerous bilateral renal cysts which appear  benign. Left midpole nonobstructing renal stones. No hydronephrosis. Urinary bladder and adrenal glands unremarkable. Stomach/Bowel: Diffuse left colonic diverticulosis. No active diverticulitis. Stomach and small bowel decompressed, unremarkable. Vascular/Lymphatic: Diffuse aortic and iliac calcifications. No aneurysm. There is bulky retroperitoneal adenopathy extending into the iliac chains bilaterally. Index large left common iliac lymph node has a short axis diameter of 3.0 cm. Large left external iliac lymph node has a short axis diameter of 2.7 cm. Right internal iliac lymph node has a short axis diameter of 2.5 cm. Retrocaval lymph node has a short axis diameter of 1.5 cm. Some of the nodes have a mixed low-density centrally suggesting necrosis. Reproductive: Prostate enlargement. Other: No free fluid or free air. Musculoskeletal: Diffuse degenerative changes throughout the lumbar spine. No acute bony abnormality. IMPRESSION: Chest: Interstitial thickening and ground-glass opacities in the upper lobes bilaterally. This is nonspecific. Given the cardiomegaly and moderate bilateral effusions, this could reflect an atypical appearance of edema. Pneumonia or other inflammatory processes possible. Moderate bilateral pleural effusions. Borderline sized precarinal lymph node. Abdomen: Bulky retroperitoneal adenopathy in the abdomen and pelvis. Some of the nodes appear necrotic centrally. This is concerning for lymphoma or metastatic disease. Possible small layering gallstone in the gallbladder. Bilateral renal cysts.  Left nephrolithiasis.  No hydronephrosis. Left colonic diverticulosis. Electronically Signed   By: Rolm Baptise M.D.   On: 04/06/2017 15:32   Ct Abdomen Pelvis W Contrast  Result Date: 04/06/2017 CLINICAL DATA:  Weight loss, abdominal pain, distention. Shortness of breath. EXAM: CT CHEST, ABDOMEN, AND PELVIS WITH CONTRAST TECHNIQUE: Multidetector CT imaging of the chest, abdomen and pelvis was  performed following the standard protocol during bolus administration of intravenous contrast. CONTRAST:  165m ISOVUE-300 IOPAMIDOL (ISOVUE-300) INJECTION 61% COMPARISON:  Chest x-ray 04/04/2017 FINDINGS: CT CHEST FINDINGS Cardiovascular: Cardiomegaly. Left pacer in place with leads in the right atrium and right ventricle. Atherosclerotic disease throughout the aorta. No aneurysm or dissection. Mediastinum/Nodes: Borderline sized pretracheal lymph node, 11 mm on image 21. No axillary or hilar adenopathy. Lungs/Pleura: Interlobular septal thickening and ground-glass opacities noted in the upper lobes bilaterally, up with sparing of the mid and lower lungs. Musculoskeletal: No acute bony abnormality. Degenerative changes in the thoracic spine. CT ABDOMEN PELVIS FINDINGS Hepatobiliary: Head probable small stone layering in the gallbladder. No focal hepatic abnormality. Pancreas: No focal abnormality or ductal dilatation. Spleen: No focal abnormality.  Normal size. Adrenals/Urinary Tract: Numerous bilateral renal cysts which appear benign. Left midpole nonobstructing renal stones. No hydronephrosis. Urinary bladder and adrenal glands unremarkable. Stomach/Bowel: Diffuse left colonic diverticulosis. No active diverticulitis. Stomach and small bowel decompressed, unremarkable. Vascular/Lymphatic: Diffuse aortic and iliac calcifications. No aneurysm. There is bulky retroperitoneal adenopathy extending into the iliac chains bilaterally. Index large left common iliac lymph node has a short axis diameter of 3.0 cm. Large left external iliac lymph node has a short axis diameter  of 2.7 cm. Right internal iliac lymph node has a short axis diameter of 2.5 cm. Retrocaval lymph node has a short axis diameter of 1.5 cm. Some of the nodes have a mixed low-density centrally suggesting necrosis. Reproductive: Prostate enlargement. Other: No free fluid or free air. Musculoskeletal: Diffuse degenerative changes throughout the lumbar  spine. No acute bony abnormality. IMPRESSION: Chest: Interstitial thickening and ground-glass opacities in the upper lobes bilaterally. This is nonspecific. Given the cardiomegaly and moderate bilateral effusions, this could reflect an atypical appearance of edema. Pneumonia or other inflammatory processes possible. Moderate bilateral pleural effusions. Borderline sized precarinal lymph node. Abdomen: Bulky retroperitoneal adenopathy in the abdomen and pelvis. Some of the nodes appear necrotic centrally. This is concerning for lymphoma or metastatic disease. Possible small layering gallstone in the gallbladder. Bilateral renal cysts.  Left nephrolithiasis.  No hydronephrosis. Left colonic diverticulosis. Electronically Signed   By: Rolm Baptise M.D.   On: 04/06/2017 15:32    Microbiology: Recent Results (from the past 240 hour(s))  Culture, blood (Routine X 2) w Reflex to ID Panel     Status: None   Collection Time: 04/10/17 12:05 PM  Result Value Ref Range Status   Specimen Description BLOOD LEFT ARM  Final   Special Requests   Final    BOTTLES DRAWN AEROBIC AND ANAEROBIC Blood Culture adequate volume   Culture   Final    NO GROWTH 5 DAYS Performed at [facility], [address] [place], [place] [address]    Report Status 04/15/2017 FINAL  Final  Culture, blood (Routine X 2) w Reflex to ID Panel     Status: None   Collection Time: 04/10/17 12:40 PM  Result Value Ref Range Status   Specimen Description BLOOD RIGHT ANTECUBITAL  Final   Special Requests   Final    BOTTLES DRAWN AEROBIC AND ANAEROBIC Blood Culture adequate volume   Culture   Final    NO GROWTH 5 DAYS Performed at [facility], [place] [address] [place], [place] [address]    Report Status 04/15/2017 FINAL  Final     Labs: Basic Metabolic Panel: Recent Labs  Lab 04/15/17 0509 04/16/17 0558 04/16/17 1522 04/17/17 0424 04/18/17 0453  NA 144 149* 144 144 140  K 3.5 3.5 3.5 3.6 3.5  CL 114* 114* 109 110 108   CO2 19* _0 GLUCOSE 152* 93 174* 149* 105*  BUN 43* 42* 41* 41* 34*  CREATININE 1.55* 1.61* 1.77* 1.58* 1.26*  CALCIUM 8.7* 8.5* 8.3* 8.2* 8.4*   Liver Function Tests: No results for input(s): AST, ALT, ALKPHOS, BILITOT, PROT, ALBUMIN in the last 168 hours. No results for input(s): LIPASE, AMYLASE in the last 168 hours. No results for input(s): AMMONIA in the last 168 hours. CBC: Recent Labs  Lab 04/13/17 0534 04/14/17 0904 04/15/17 0509 04/16/17 0558  WBC 9.8 10.7* 9.8 10.5  NEUTROABS  --  8.9* 8.0* 8.7*  HGB 10.6* 10.8* 10.9* 11.2*  HCT 34.1* 34.8* 35.9* 36.4*  MCV 87.0 87.9 88.0 87.1  PLT 199 196 188 179   Cardiac Enzymes: No results for input(s): CKTOTAL, CKMB, CKMBINDEX, TROPONINI in the last 168 hours. BNP: BNP (last 3 results) No results for input(s): BNP in the last 8760 hours.  ProBNP (last 3 results) No results for input(s): PROBNP in the last 8760 hours.  CBG: No results for input(s): GLUCAP in the last 168 hours.     SignedIrine Seal MD.  [facility] 04/19/2017, 3:34 PM

## 2017-04-19 NOTE — Progress Notes (Signed)
CSW following for disposition. Pt/family have decided on full comfort care and prepared for pt to transfer to residential hospice home. As there are not beds at [facility] today, family has decided to pursue [facility].  CSW made referral- will follow.  Sharren Bridge, MSW, LCSW Clinical Social Work 04/19/2017 [phone]

## 2017-04-19 NOTE — Consult Note (Signed)
[place]: Pt was evaluated for hospice home at [place] at reqeust of MSW [place]. The pt family was met and reviewed hospice philosophy and goals of care. Family is in agreement with comfort approach. Our medical director reviewed case and pt's records with approval as well. Offered bed to the family and they are in agreement to the pt being transferred to hospice home in [place]. MSW notified MD of bed offer and he will work on discharging pt. Thank you for this referral and the opportunity to serve our community. Webb Silversmith RN [phone]

## 2017-04-19 NOTE — Care Management Important Message (Signed)
Important Message  Patient Details  Name: MARCIA LEPERA MRN: [id number] Date of Birth: [birth year]   Medicare Important Message Given:  Yes    Kerin Salen 04/19/2017, 11:23 AMImportant Message  Patient Details  Name: NOVAK STGERMAINE MRN: [id number] Date of Birth: [birth year]   Medicare Important Message Given:  Yes    Kerin Salen 04/19/2017, 11:23 AM

## 2017-04-20 ENCOUNTER — Telehealth: Payer: Self-pay | Admitting: *Deleted

## 2017-04-20 NOTE — Telephone Encounter (Signed)
Pt was on TCM list admitted 04/10/17 for Acute metabolic encephalopathy.The patient was recently diagnosed and being treated for pneumonia with doxycycline started on 10/22. The day he was brought to the emergency room he had been seen outside try to climb through a window of his house in his underwear. Patient was disoriented. Pt has been D/C 04/19/17 w/significant failure to thrive symptoms with poor oral intake. Patient with a poor prognosis. Palliative care was consulted on the patient andfamily have decided on full comfort measures. Patient will be discharged to residential hospice home...Johny Chess

## 2017-05-14 DEATH — deceased

## 2017-07-19 ENCOUNTER — Telehealth: Payer: Self-pay | Admitting: Internal Medicine

## 2017-07-19 NOTE — Telephone Encounter (Signed)
Attempted to call patient to schedule AWV. Patient did not answer could not leave voicemail. Will try to contact patient at a later time. SF

## 2018-12-31 IMAGING — DX DG CHEST 2V
2 series · 2 of 2 positions shown · non-contrast
Comparison: 02/22/2017, 11/16/2016, 11/04/2016

CLINICAL DATA: Cough

EXAM:
CHEST  2 VIEW

[chest pa]
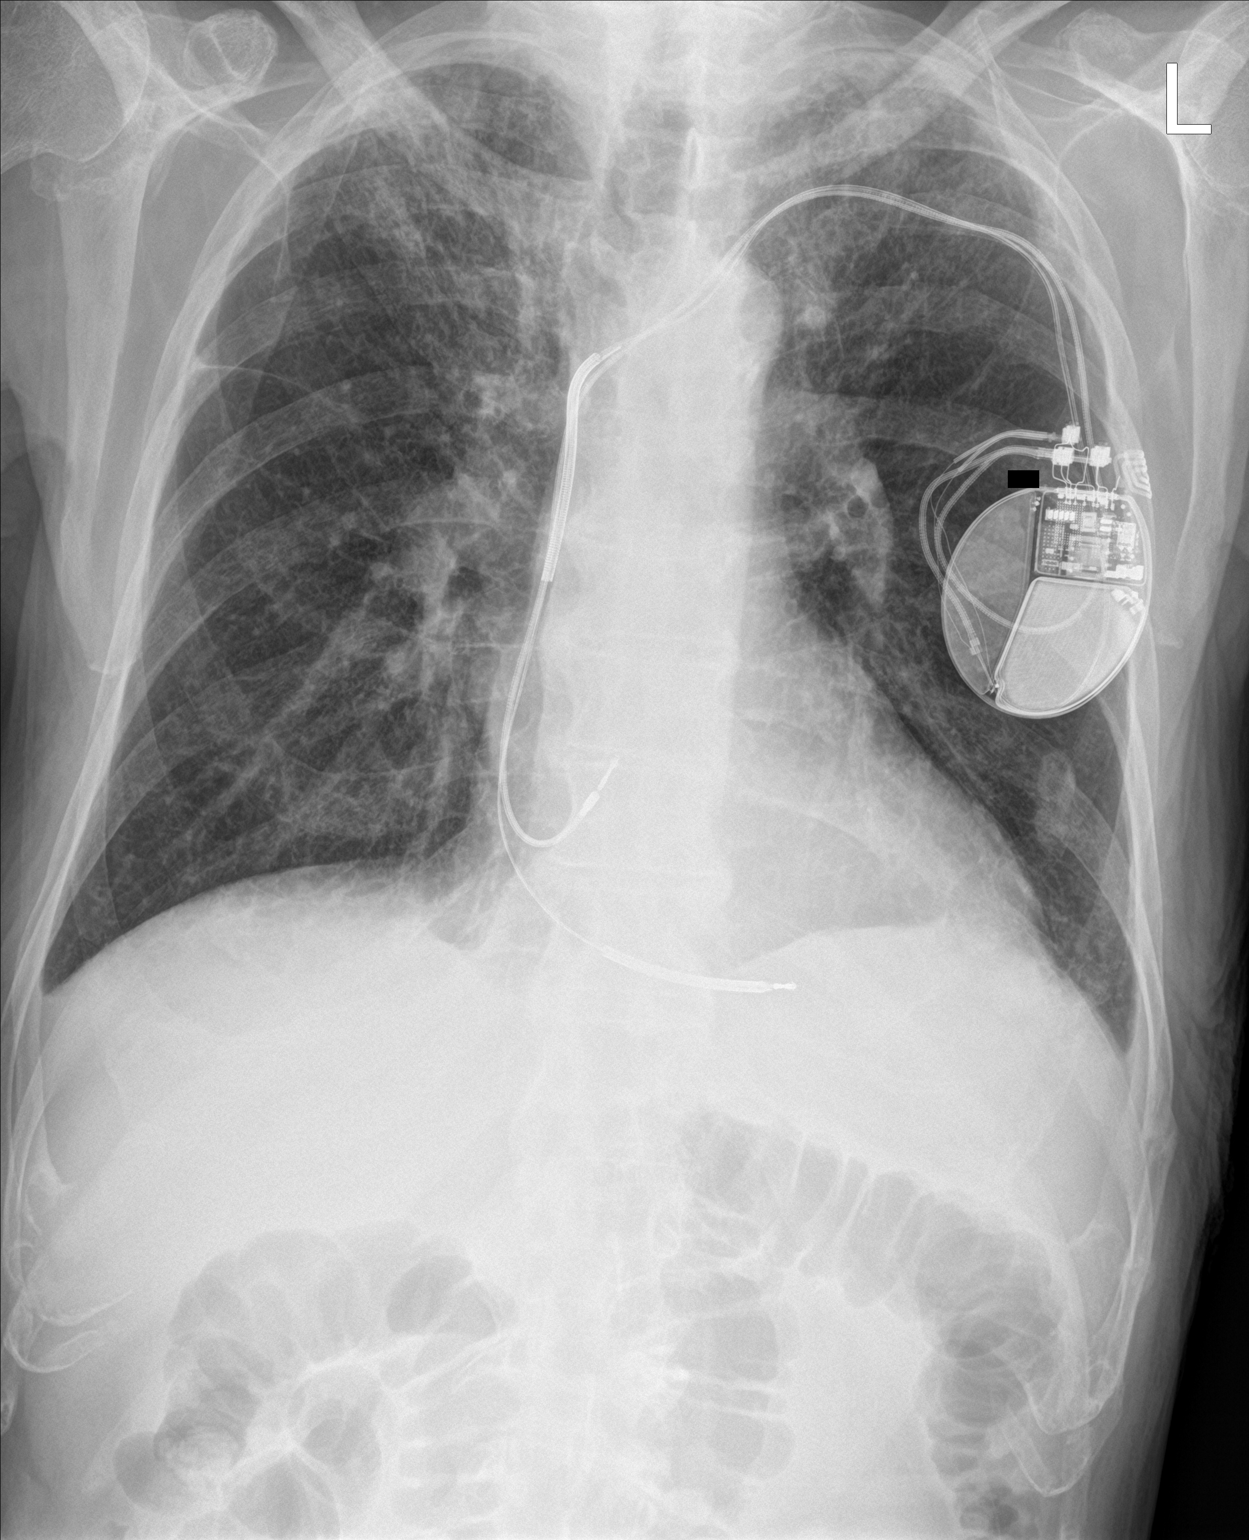

[chest lat]
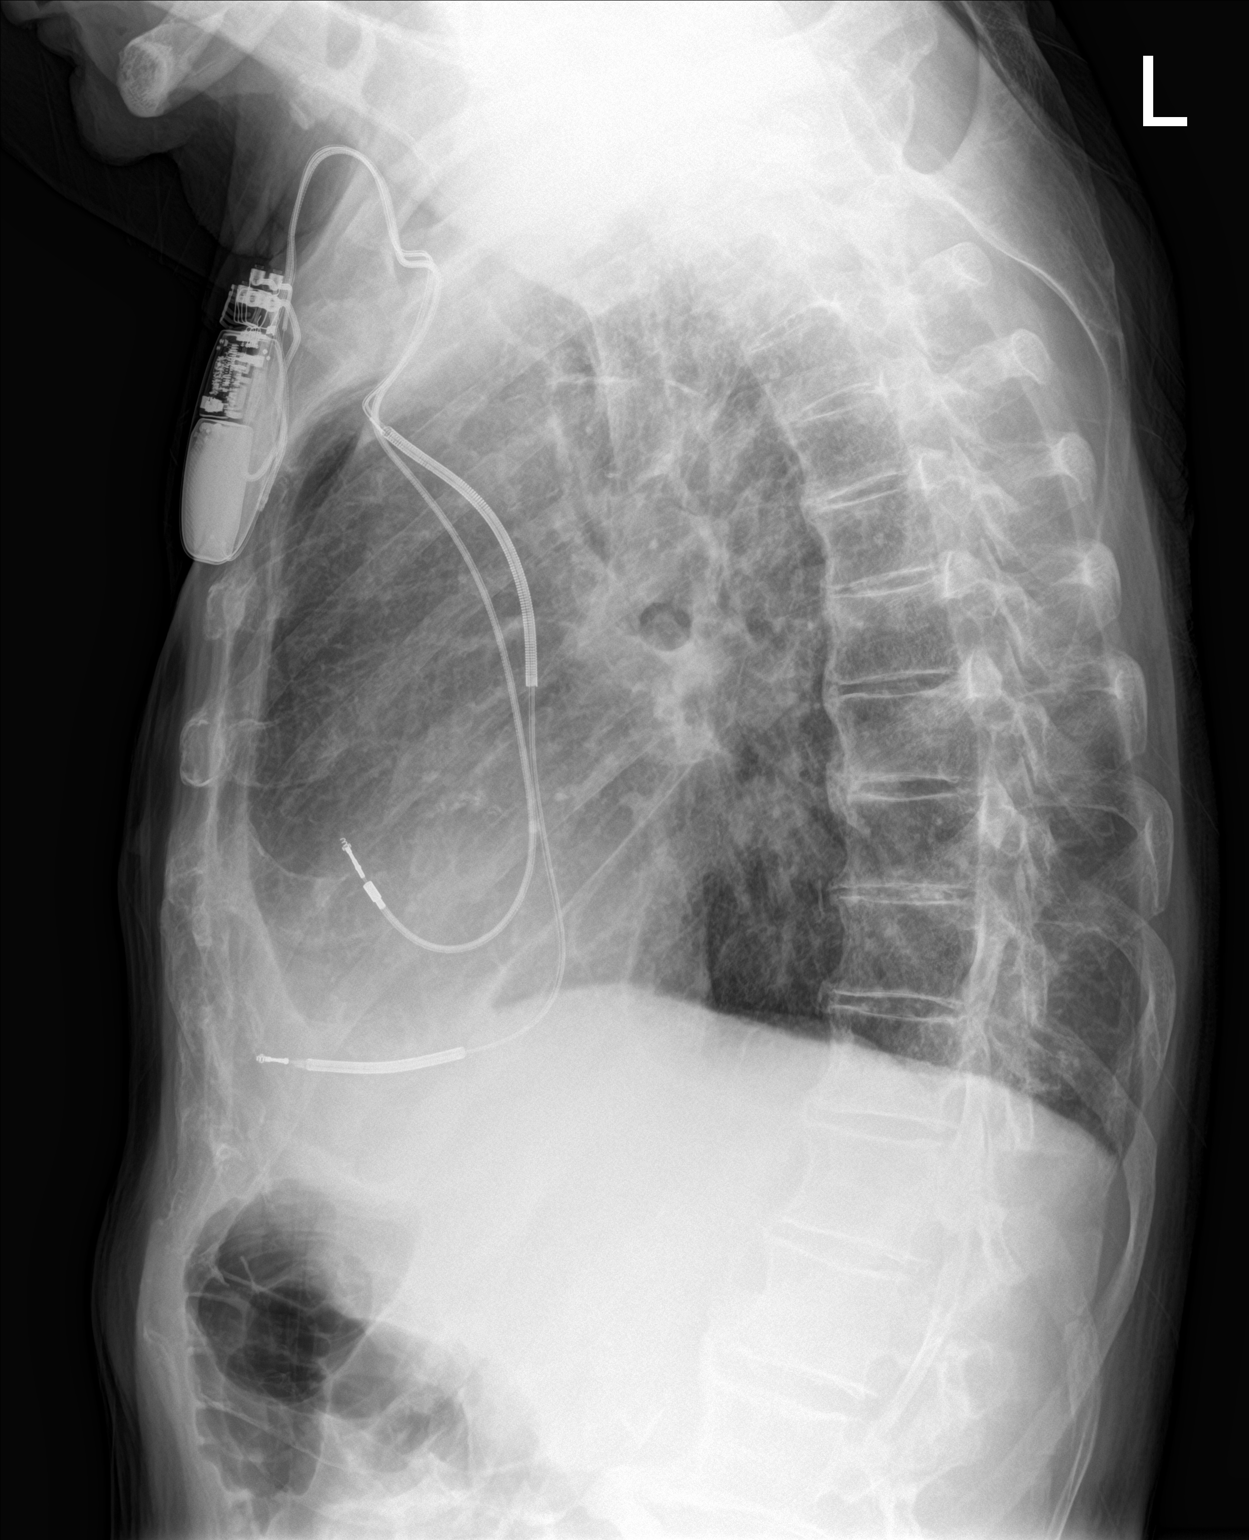

[2 of 2 positions shown; findings below may reference images not displayed]

FINDINGS: Left-sided duo lead pacing device as before. Hyperinflation.
Interstitial and alveolar opacity in the bilateral upper lobes.
Stable cardiomediastinal silhouette with atherosclerosis. No
pneumothorax. No pleural effusion. Old appearing left lower rib
fractures
IMPRESSION: 1. Interstitial and alveolar opacity within the bilateral upper
lobes right greater than left suspicious for pneumonia; radiographic
follow-up to resolution is recommended.

## 2019-01-06 IMAGING — CR DG CHEST 2V
2 series · 2 of 2 positions shown · non-contrast
Comparison: 04/06/2017 CT, 04/04/2017

CLINICAL DATA: Altered mental status, possible lung infection

EXAM:
CHEST  2 VIEW

[w chest lat]
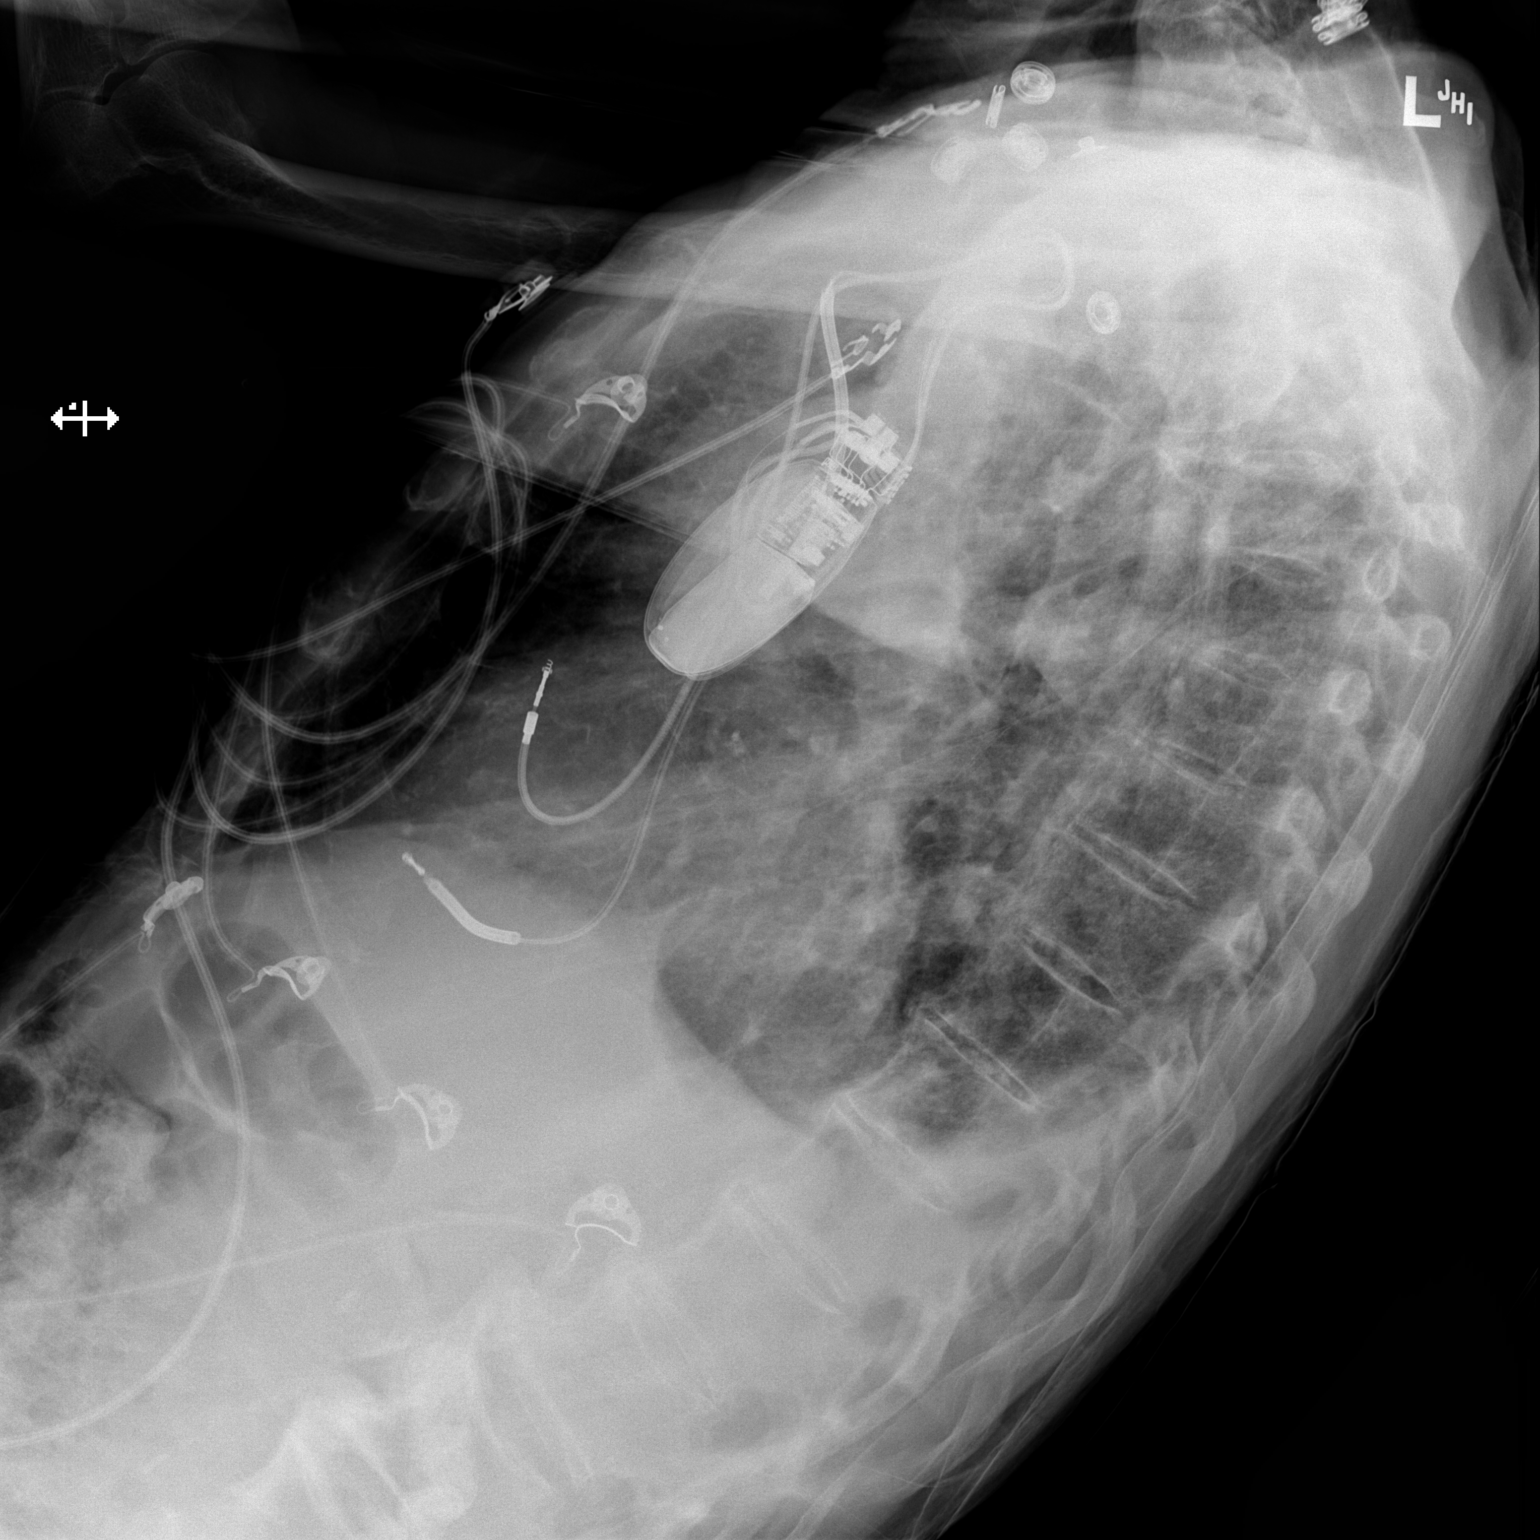

[x chest ap]
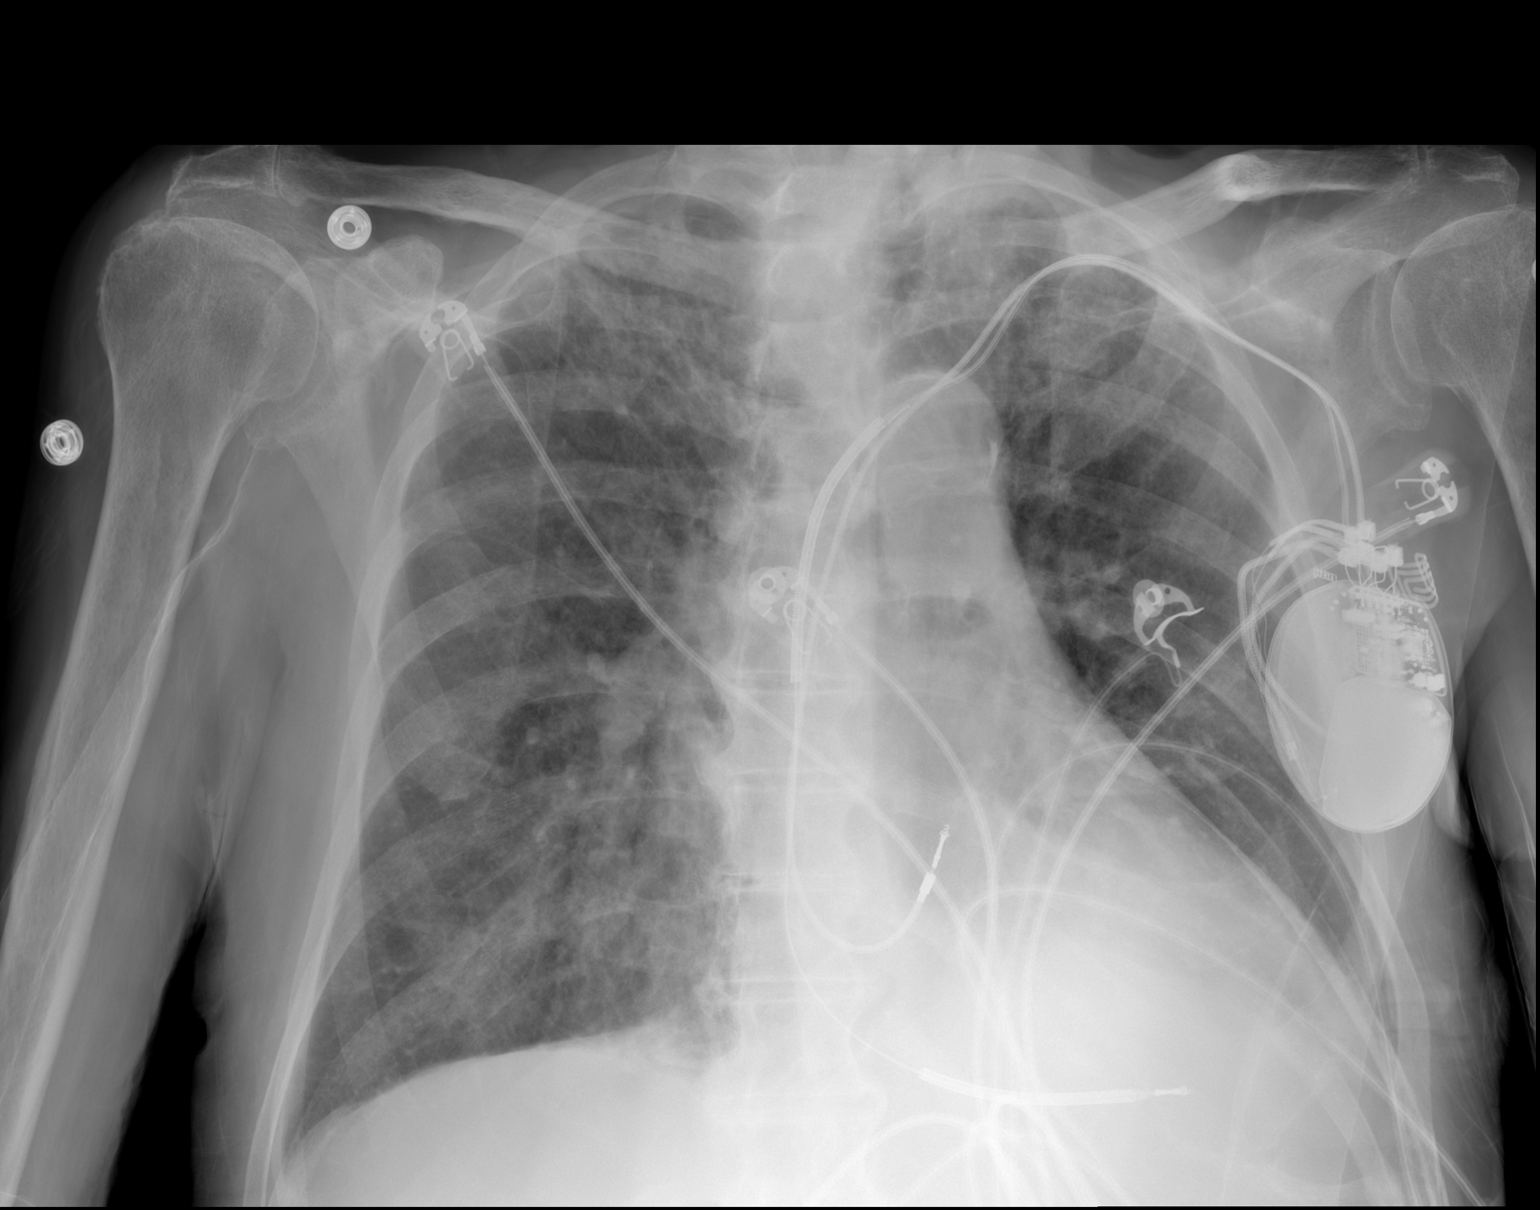

[2 of 2 positions shown; findings below may reference images not displayed]

FINDINGS: Left-sided pacing device as before. Similar appearance of upper lobe
interstitial and alveolar opacity. Increasing opacity at the left
greater than right lung base. Development of small pleural
effusions. Mild cardiomegaly with aortic atherosclerosis. No
pneumothorax.
IMPRESSION: 1. Development of small pleural effusions.
2. Similar appearance of biapical interstitial and alveolar opacity
3. Worsening atelectasis or infiltrate at the left greater than
right lung base
